# Patient Record
Sex: Female | Born: 1950
Health system: Southern US, Community
[De-identification: ages and names within clinical notes are randomized; demographics above are authoritative.]

## PROBLEM LIST (undated history)

## (undated) DIAGNOSIS — M792 Neuralgia and neuritis, unspecified: Secondary | ICD-10-CM

## (undated) DIAGNOSIS — F32A Depression, unspecified: Secondary | ICD-10-CM

## (undated) DIAGNOSIS — Z87898 Personal history of other specified conditions: Secondary | ICD-10-CM

## (undated) DIAGNOSIS — F329 Major depressive disorder, single episode, unspecified: Secondary | ICD-10-CM

## (undated) DIAGNOSIS — K219 Gastro-esophageal reflux disease without esophagitis: Secondary | ICD-10-CM

## (undated) DIAGNOSIS — F419 Anxiety disorder, unspecified: Secondary | ICD-10-CM

## (undated) DIAGNOSIS — M797 Fibromyalgia: Secondary | ICD-10-CM

## (undated) DIAGNOSIS — Z8719 Personal history of other diseases of the digestive system: Secondary | ICD-10-CM

## (undated) DIAGNOSIS — M1990 Unspecified osteoarthritis, unspecified site: Secondary | ICD-10-CM

## (undated) DIAGNOSIS — T4145XA Adverse effect of unspecified anesthetic, initial encounter: Secondary | ICD-10-CM

## (undated) DIAGNOSIS — I10 Essential (primary) hypertension: Secondary | ICD-10-CM

## (undated) DIAGNOSIS — I509 Heart failure, unspecified: Secondary | ICD-10-CM

## (undated) DIAGNOSIS — IMO0002 Reserved for concepts with insufficient information to code with codable children: Secondary | ICD-10-CM

## (undated) DIAGNOSIS — M2550 Pain in unspecified joint: Secondary | ICD-10-CM

## (undated) DIAGNOSIS — G4730 Sleep apnea, unspecified: Secondary | ICD-10-CM

## (undated) DIAGNOSIS — M5126 Other intervertebral disc displacement, lumbar region: Secondary | ICD-10-CM

## (undated) DIAGNOSIS — T8859XA Other complications of anesthesia, initial encounter: Secondary | ICD-10-CM

## (undated) HISTORY — DX: Neuralgia and neuritis, unspecified: M79.2

## (undated) HISTORY — DX: Pain in unspecified joint: M25.50

## (undated) HISTORY — PX: BLADDER REPAIR: SHX76

## (undated) HISTORY — PX: GALLBLADDER SURGERY: SHX652

## (undated) HISTORY — DX: Major depressive disorder, single episode, unspecified: F32.9

## (undated) HISTORY — DX: Depression, unspecified: F32.A

## (undated) HISTORY — DX: Other intervertebral disc displacement, lumbar region: M51.26

## (undated) HISTORY — PX: BACK SURGERY: SHX140

## (undated) HISTORY — PX: TUBAL LIGATION: SHX77

## (undated) HISTORY — PX: CARPAL TUNNEL RELEASE: SHX101

## (undated) HISTORY — PX: REPLACEMENT TOTAL KNEE: SUR1224

## (undated) HISTORY — PX: SPINAL FUSION: SHX223

## (undated) HISTORY — PX: CHOLECYSTECTOMY: SHX55

## (undated) HISTORY — PX: ROTATOR CUFF REPAIR: SHX139

## (undated) HISTORY — DX: Heart failure, unspecified: I50.9

## (undated) HISTORY — DX: Gastro-esophageal reflux disease without esophagitis: K21.9

## (undated) HISTORY — DX: Fibromyalgia: M79.7

---

## 1976-12-06 DIAGNOSIS — Z87898 Personal history of other specified conditions: Secondary | ICD-10-CM

## 1976-12-06 HISTORY — DX: Personal history of other specified conditions: Z87.898

## 2001-07-24 ENCOUNTER — Encounter: Payer: Self-pay | Admitting: Family Medicine

## 2001-07-24 ENCOUNTER — Ambulatory Visit (HOSPITAL_COMMUNITY): Admission: RE | Admit: 2001-07-24 | Discharge: 2001-07-24 | Payer: Self-pay | Admitting: Family Medicine

## 2001-09-29 ENCOUNTER — Ambulatory Visit (HOSPITAL_COMMUNITY): Admission: RE | Admit: 2001-09-29 | Discharge: 2001-09-29 | Payer: Self-pay | Admitting: Neurosurgery

## 2003-09-25 ENCOUNTER — Encounter: Payer: Self-pay | Admitting: Orthopedic Surgery

## 2003-10-02 ENCOUNTER — Encounter: Payer: Self-pay | Admitting: Orthopedic Surgery

## 2005-10-19 ENCOUNTER — Other Ambulatory Visit: Admission: RE | Admit: 2005-10-19 | Discharge: 2005-10-19 | Payer: Self-pay | Admitting: Dermatology

## 2007-09-07 ENCOUNTER — Ambulatory Visit (HOSPITAL_COMMUNITY): Admission: RE | Admit: 2007-09-07 | Discharge: 2007-09-07 | Payer: Self-pay | Admitting: Family Medicine

## 2008-03-29 ENCOUNTER — Emergency Department (HOSPITAL_COMMUNITY): Admission: EM | Admit: 2008-03-29 | Discharge: 2008-03-29 | Payer: Self-pay | Admitting: Emergency Medicine

## 2008-03-30 ENCOUNTER — Emergency Department (HOSPITAL_COMMUNITY): Admission: EM | Admit: 2008-03-30 | Discharge: 2008-03-30 | Payer: Self-pay | Admitting: Emergency Medicine

## 2008-06-03 ENCOUNTER — Emergency Department (HOSPITAL_COMMUNITY): Admission: EM | Admit: 2008-06-03 | Discharge: 2008-06-03 | Payer: Self-pay | Admitting: Emergency Medicine

## 2008-06-06 ENCOUNTER — Ambulatory Visit (HOSPITAL_COMMUNITY): Admission: RE | Admit: 2008-06-06 | Discharge: 2008-06-06 | Payer: Self-pay | Admitting: Family Medicine

## 2008-09-23 ENCOUNTER — Ambulatory Visit: Payer: Self-pay | Admitting: Orthopedic Surgery

## 2008-09-23 DIAGNOSIS — M7150 Other bursitis, not elsewhere classified, unspecified site: Secondary | ICD-10-CM | POA: Insufficient documentation

## 2008-09-23 DIAGNOSIS — M25549 Pain in joints of unspecified hand: Secondary | ICD-10-CM | POA: Insufficient documentation

## 2008-10-17 ENCOUNTER — Ambulatory Visit: Payer: Self-pay | Admitting: Orthopedic Surgery

## 2008-12-06 ENCOUNTER — Emergency Department (HOSPITAL_COMMUNITY): Admission: EM | Admit: 2008-12-06 | Discharge: 2008-12-06 | Payer: Self-pay | Admitting: Emergency Medicine

## 2009-05-22 ENCOUNTER — Ambulatory Visit: Payer: Self-pay | Admitting: Orthopedic Surgery

## 2009-05-22 DIAGNOSIS — M25569 Pain in unspecified knee: Secondary | ICD-10-CM | POA: Insufficient documentation

## 2009-05-22 DIAGNOSIS — IMO0002 Reserved for concepts with insufficient information to code with codable children: Secondary | ICD-10-CM | POA: Insufficient documentation

## 2009-05-22 DIAGNOSIS — M1710 Unilateral primary osteoarthritis, unspecified knee: Secondary | ICD-10-CM

## 2009-07-03 ENCOUNTER — Ambulatory Visit: Payer: Self-pay | Admitting: Orthopedic Surgery

## 2009-07-03 ENCOUNTER — Encounter (INDEPENDENT_AMBULATORY_CARE_PROVIDER_SITE_OTHER): Payer: Self-pay | Admitting: *Deleted

## 2009-07-03 DIAGNOSIS — M23302 Other meniscus derangements, unspecified lateral meniscus, unspecified knee: Secondary | ICD-10-CM | POA: Insufficient documentation

## 2009-07-09 ENCOUNTER — Ambulatory Visit (HOSPITAL_COMMUNITY): Admission: RE | Admit: 2009-07-09 | Discharge: 2009-07-09 | Payer: Self-pay | Admitting: Orthopedic Surgery

## 2009-07-09 ENCOUNTER — Encounter (INDEPENDENT_AMBULATORY_CARE_PROVIDER_SITE_OTHER): Payer: Self-pay | Admitting: *Deleted

## 2009-07-21 ENCOUNTER — Ambulatory Visit: Payer: Self-pay | Admitting: Orthopedic Surgery

## 2009-07-30 ENCOUNTER — Telehealth: Payer: Self-pay | Admitting: Orthopedic Surgery

## 2009-08-01 ENCOUNTER — Ambulatory Visit: Payer: Self-pay | Admitting: Orthopedic Surgery

## 2009-08-01 ENCOUNTER — Ambulatory Visit (HOSPITAL_COMMUNITY): Admission: RE | Admit: 2009-08-01 | Discharge: 2009-08-01 | Payer: Self-pay | Admitting: Orthopedic Surgery

## 2009-08-05 ENCOUNTER — Ambulatory Visit: Payer: Self-pay | Admitting: Orthopedic Surgery

## 2009-08-06 ENCOUNTER — Encounter (HOSPITAL_COMMUNITY): Admission: RE | Admit: 2009-08-06 | Discharge: 2009-09-03 | Payer: Self-pay | Admitting: Orthopedic Surgery

## 2009-08-06 ENCOUNTER — Encounter: Payer: Self-pay | Admitting: Orthopedic Surgery

## 2009-08-08 ENCOUNTER — Encounter: Payer: Self-pay | Admitting: Orthopedic Surgery

## 2009-08-15 ENCOUNTER — Encounter: Payer: Self-pay | Admitting: Orthopedic Surgery

## 2009-08-26 ENCOUNTER — Ambulatory Visit: Payer: Self-pay | Admitting: Orthopedic Surgery

## 2009-10-07 ENCOUNTER — Ambulatory Visit: Payer: Self-pay | Admitting: Orthopedic Surgery

## 2009-11-03 ENCOUNTER — Ambulatory Visit: Payer: Self-pay | Admitting: Orthopedic Surgery

## 2009-11-10 ENCOUNTER — Telehealth: Payer: Self-pay | Admitting: Orthopedic Surgery

## 2009-12-10 ENCOUNTER — Ambulatory Visit: Payer: Self-pay | Admitting: Orthopedic Surgery

## 2010-01-27 ENCOUNTER — Ambulatory Visit: Payer: Self-pay | Admitting: Orthopedic Surgery

## 2010-02-04 ENCOUNTER — Ambulatory Visit: Payer: Self-pay | Admitting: Orthopedic Surgery

## 2010-02-05 ENCOUNTER — Encounter: Payer: Self-pay | Admitting: Orthopedic Surgery

## 2010-02-05 ENCOUNTER — Telehealth: Payer: Self-pay | Admitting: Orthopedic Surgery

## 2010-02-06 ENCOUNTER — Encounter: Payer: Self-pay | Admitting: Orthopedic Surgery

## 2010-02-06 ENCOUNTER — Encounter (INDEPENDENT_AMBULATORY_CARE_PROVIDER_SITE_OTHER): Payer: Self-pay | Admitting: *Deleted

## 2010-02-09 ENCOUNTER — Encounter: Payer: Self-pay | Admitting: Orthopedic Surgery

## 2010-02-13 ENCOUNTER — Inpatient Hospital Stay (HOSPITAL_COMMUNITY): Admission: RE | Admit: 2010-02-13 | Discharge: 2010-02-16 | Payer: Self-pay | Admitting: Orthopedic Surgery

## 2010-02-13 ENCOUNTER — Ambulatory Visit: Payer: Self-pay | Admitting: Orthopedic Surgery

## 2010-02-16 ENCOUNTER — Telehealth (INDEPENDENT_AMBULATORY_CARE_PROVIDER_SITE_OTHER): Payer: Self-pay | Admitting: *Deleted

## 2010-02-17 ENCOUNTER — Encounter: Payer: Self-pay | Admitting: Orthopedic Surgery

## 2010-02-20 ENCOUNTER — Telehealth: Payer: Self-pay | Admitting: Orthopedic Surgery

## 2010-02-20 ENCOUNTER — Encounter: Payer: Self-pay | Admitting: Orthopedic Surgery

## 2010-02-26 ENCOUNTER — Ambulatory Visit: Payer: Self-pay | Admitting: Orthopedic Surgery

## 2010-02-26 DIAGNOSIS — Z96659 Presence of unspecified artificial knee joint: Secondary | ICD-10-CM | POA: Insufficient documentation

## 2010-03-05 ENCOUNTER — Encounter: Payer: Self-pay | Admitting: Orthopedic Surgery

## 2010-03-13 ENCOUNTER — Encounter: Payer: Self-pay | Admitting: Orthopedic Surgery

## 2010-03-13 ENCOUNTER — Encounter (INDEPENDENT_AMBULATORY_CARE_PROVIDER_SITE_OTHER): Payer: Self-pay | Admitting: *Deleted

## 2010-03-17 ENCOUNTER — Encounter: Payer: Self-pay | Admitting: Orthopedic Surgery

## 2010-03-17 ENCOUNTER — Encounter (HOSPITAL_COMMUNITY): Admission: RE | Admit: 2010-03-17 | Discharge: 2010-04-16 | Payer: Self-pay | Admitting: Orthopedic Surgery

## 2010-03-20 ENCOUNTER — Encounter: Payer: Self-pay | Admitting: Orthopedic Surgery

## 2010-03-26 ENCOUNTER — Ambulatory Visit: Payer: Self-pay | Admitting: Orthopedic Surgery

## 2010-04-16 ENCOUNTER — Encounter: Payer: Self-pay | Admitting: Orthopedic Surgery

## 2010-04-21 ENCOUNTER — Encounter (HOSPITAL_COMMUNITY): Admission: RE | Admit: 2010-04-21 | Discharge: 2010-05-21 | Payer: Self-pay | Admitting: Orthopedic Surgery

## 2010-05-01 ENCOUNTER — Encounter: Payer: Self-pay | Admitting: Orthopedic Surgery

## 2010-05-05 ENCOUNTER — Encounter (INDEPENDENT_AMBULATORY_CARE_PROVIDER_SITE_OTHER): Payer: Self-pay | Admitting: *Deleted

## 2010-05-05 ENCOUNTER — Ambulatory Visit: Payer: Self-pay | Admitting: Orthopedic Surgery

## 2010-06-03 ENCOUNTER — Ambulatory Visit (HOSPITAL_COMMUNITY): Admission: RE | Admit: 2010-06-03 | Discharge: 2010-06-03 | Payer: Self-pay | Admitting: Family Medicine

## 2010-08-05 ENCOUNTER — Ambulatory Visit: Payer: Self-pay | Admitting: Orthopedic Surgery

## 2010-12-15 ENCOUNTER — Ambulatory Visit: Admit: 2010-12-15 | Payer: Self-pay | Admitting: Orthopedic Surgery

## 2010-12-15 ENCOUNTER — Ambulatory Visit
Admission: RE | Admit: 2010-12-15 | Discharge: 2010-12-15 | Payer: Self-pay | Source: Home / Self Care | Attending: Orthopedic Surgery | Admitting: Orthopedic Surgery

## 2010-12-15 ENCOUNTER — Encounter: Payer: Self-pay | Admitting: Orthopedic Surgery

## 2010-12-15 DIAGNOSIS — M47817 Spondylosis without myelopathy or radiculopathy, lumbosacral region: Secondary | ICD-10-CM | POA: Insufficient documentation

## 2010-12-15 DIAGNOSIS — M48061 Spinal stenosis, lumbar region without neurogenic claudication: Secondary | ICD-10-CM | POA: Insufficient documentation

## 2010-12-15 DIAGNOSIS — M25559 Pain in unspecified hip: Secondary | ICD-10-CM | POA: Insufficient documentation

## 2010-12-21 ENCOUNTER — Encounter (HOSPITAL_COMMUNITY)
Admission: RE | Admit: 2010-12-21 | Discharge: 2011-01-05 | Payer: Self-pay | Source: Home / Self Care | Attending: Orthopedic Surgery | Admitting: Orthopedic Surgery

## 2011-01-05 NOTE — Letter (Signed)
Summary: Medication list  Medication list   Imported By: Jacklynn Ganong 02/10/2010 09:53:01  _____________________________________________________________________  External Attachment:    Type:   Image     Comment:   External Document

## 2011-01-05 NOTE — Miscellaneous (Signed)
Summary: PT clinical evaluation  PT clinical evaluation   Imported By: Jacklynn Ganong 03/25/2010 16:43:05  _____________________________________________________________________  External Attachment:    Type:   Image     Comment:   External Document

## 2011-01-05 NOTE — Miscellaneous (Signed)
Summary: faxed info to [facility] for cpm and to [facility] for pt  Clinical Lists Changes

## 2011-01-05 NOTE — Letter (Signed)
Summary: Work [facility]  [address]  [place], [place] [address]   Phone: [phone]  Fax: [phone]    Today's Date: May 05, 2010  Name of Patient: Emily [name]  The above named patient had a medical visit in our office today.  Please take this into consideration when reviewing the time away from work.    Special Instructions:   [ X ] To be off the remainder of today, returning to the normal, full duty work schedule tomorrow, May 06, 2010.    Arly.Keller ] Other No restrictions __________________________________________ ________________________________________________________________________   Sincerely,   Terrance Mass, MD

## 2011-01-05 NOTE — Letter (Signed)
Summary: Authorization Inpatient admission 02/13/10  Authorization Inpatient admission 02/13/10   Imported By: Cammie Sickle 02/20/2010 17:39:38  _____________________________________________________________________  External Attachment:    Type:   Image     Comment:   External Document

## 2011-01-05 NOTE — Miscellaneous (Signed)
Summary: Home Care Dischg summary [facility] Dischg summary [place]   Imported By: Cammie Sickle 05/27/2010 20:42:37  _____________________________________________________________________  External Attachment:    Type:   Image     Comment:   External Document

## 2011-01-05 NOTE — Assessment & Plan Note (Signed)
Summary: 1 M RE-CK LT KNEE/MEDCOST/CAF   Visit Type:  Follow-up  CC:  left knee pain.  History of Present Illness: I saw Emily [name] in the office today for a followup visit.  She is a 60 years old woman with the complaint of:  DX: Left knee pain after surgery.  Treatment :DOS 08/01/09 left knee/Procedure arthroscopy left knee with partial medial menisectomy  MEDS: Naproxen 500mg  two times a day.  Complaints:  Quit taking Relafen, no relief. Naproxen helps. Has some swelling and pain medially, postional changes makes her lateral knee hurt.  Today, scheduled for: one month recheck left knee after new med.  Review of systems the patient complains of some discomfort in the RIGHT knee status post lateral meniscectomy about 18 years ago by Dr. Hilda Lias.  She also was evaluated at [facility] for bone lesion which was found to be an occult fracture.  She is worried about the RIGHT knee drifting into valgus.  Physical examination  LEFT knee.  Her range of motion is proximal at 120, motor strength is normal.  Her knee is stable.  There is no swelling.  No tenderness today.  There is crepitance in the patellofemoral joint.  Skins intact.  Neurologic exam normal.  Pulses perfusion normal and the LEFT limb.  Assessment: Stable arthritis LEFT knee control with naproxen continue.  Recheck 3 months with x-rays.   Allergies: 1)  ! Codeine 2)  ! * Bactrin 3)  ! Lortab   Impression & Recommendations:  Problem # 1:  KNEE, ARTHRITIS, DEGEN./OSTEO (ICD-715.96) Assessment Improved  Her updated medication list for this problem includes:    Nabumetone 500 Mg Tabs (Nabumetone) .Marland Kitchen... 1 by mouth two times a day    Naproxen 500 Mg Tabs (Naproxen) .Marland Kitchen... 1 by mouth two times a day  Orders: Est. Patient Level III ([address])  Discussed strengthening exercises, use of ice or heat, and medications.   Patient Instructions: 1)  Naproxen continue  2)  exercises bike is ok  3)  quad exercises  4)  return in 3  months for xrays left knee

## 2011-01-05 NOTE — Letter (Signed)
Summary: Out of Work  [facility]  [address]  [place], [place] [address]   Phone: [phone]  Fax: [phone]    March 26, 2010   Employee:  Emily [name]    To Whom It May Concern:   For Medical reasons, please continue out of work status regarding the above named employee from work as follows:  Start:   02/13/10  End/Return to work, full duty, no restrictions:   05/06/10    (Next scheduled appointment:  05/05/10)  If you need additional information, please feel free to contact our office.         Sincerely,    Terrance Mass, MD

## 2011-01-05 NOTE — Miscellaneous (Signed)
Summary: Pre-Auth information in-patient surgery  Clinical Lists Changes  Contacted insurer [employer] re: in-patient surgery scheduled 02/13/10 [facility],  [place] [address] ph [phone], reached Doralee Albino.  Rec'd pre-auth # [id number]; approved for 3day stay. If hosp needs to do updates or addit days, request via fax [phone] nurse line.

## 2011-01-05 NOTE — Miscellaneous (Signed)
Summary: PT Progress report  PT Progress report   Imported By: Jacklynn Ganong 03/02/2010 17:05:32  _____________________________________________________________________  External Attachment:    Type:   Image     Comment:   External Document

## 2011-01-05 NOTE — Assessment & Plan Note (Signed)
Summary: 1 WK RE-CK LT KNEE FOLG INJEC/MEDCOST/CAF   Visit Type:  Follow-up  CC:  increasing LEFT knee pain.  History of Present Illness:   This is a 60 year old female who has been followed since 2004 for pain and swelling of the LEFT knee she was initially treated with Naprosyn and cortisone injection did not improve and had an MRI which showed meniscal tear severe 3 compartment arthritis and synovitis of LEFT knee  In August of 2000 inch underwent arthroscopy LEFT knee partial medial meniscectomy and we also found a grade 4 lesion of her trochlea had significant synovitis which was resected  She was followed until March of this year with injections a trial of ibuprofen, trial of Relafen, restart of naproxen and one injection of Synvisc and has worsened.  She is in severe pain has a severe limp has continued swelling of the knee loss of motion.  Her pain has become unbearable and she wishes to have knee replacement surgery.  We did take x-rays today they show the medial compartment disease with some patellofemoral disease.  We discussed further surgical treatment with arthroscopy vs. medical treatment with continued Synvisc injections vs. knee replacement surgery.  She is opted for the knee replacement surgery with an understanding of the risks and benefits of the treatments described including but not limited to blood clots, pulmonary embolus, infection, amputation, reimplantation for infection, pain.  [facility] Meds: Naproxen, Lasix, Potassium, Verapamil, Lopid, Celexa, Zantac, Xanax.  Allergies (verified): 1)  ! Codeine 2)  ! * Bactrin 3)  ! 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 Cardiovascular:  Denies chest pain, palpitations, fainting, and murmurs. Respiratory:  Denies short of breath, wheezing, couch, tightness, pain on inspiration, and snoring . Gastrointestinal:  Complains of heartburn. Genitourinary:  Denies frequency, urgency, difficulty urinating, painful urination, flank pain, and bleeding in urine. Neurologic:  Denies numbness, tingling, unsteady gait, dizziness, tremors, and seizure. Musculoskeletal:  Complains of joint pain, swelling, and stiffness. Endocrine:  Denies excessive thirst, exessive urination, and heat or cold intolerance. Psychiatric:  Denies nervousness, depression, anxiety, and hallucinations. Skin:  Denies changes in the skin, poor healing, rash, itching, and redness. HEENT:  Denies blurred or double vision, eye pain, redness, and watering. Immunology:  Denies seasonal allergies, sinus problems, and allergic to bee stings. Hemoatologic:  Denies easy bleeding and brusing.  Physical Exam  Additional Exam:  GEN: well developed, well nourished, normal grooming and hygiene, no deformity and normal body habitus.   CDV: pulses are normal, no edema, no erythema. no tenderness  Lymph: normal lymph nodes   Skin: no rashes, skin lesions or open sores   NEURO: normal coordination, reflexes, sensation.   Psyche: awake, alert and oriented. Mood normal   Gait: severe limp favoring the LEFT leg   The upper extremities have normal appearance, ROM, strength  and  stability.    Evaluation of the LEFT knee shows that she has a joint effusion her range of motion is 90 and she has pain her knee is stable she is medial and patellofemoral joint line tenderness her muscle strength and tone are normal   Evaluation of the RIGHT knee shows that she has some mild limitation of motion but 125 of knee flexion mild periarticular tenderness nothing significant muscle strength and tone are normal knee is stable    Inspection ROM Motor Stability    Impression & Recommendations:  Problem # 1:  KNEE, ARTHRITIS, DEGEN./OSTEO (ICD-715.96) Assessment Deteriorated  3 x-rays are ordered of the LEFT knee  Findings, there are notable changes in the knee including medial joint space narrowing, patellofemoral spurring.  Mild varus deformity.  Impression posterolateral his LEFT knee with varus deformity which is mild  Plan: LEFT TKA   Orders: Est. Patient Level IV ([address]) Knee x-ray,  3 views ([address])  Patient Instructions: 1)  DOS 02/13/10 2)  I will call you with your preop, you will take the packet to [facility] stay [facility] for preop. 3)  Post op 1 in our office on 02/26/10

## 2011-01-05 NOTE — Assessment & Plan Note (Signed)
Summary: TKA X-Rays left knee/rrs   Visit Type:  Follow-up  CC:  left knee replacement.  History of Present Illness: I saw Carmesha Bran in the office today for a followup visit.  She is a 60 years old woman with the complaint of:  left knee replacement  Xrays today.  DOS 02-13-10.  Procedure: left total knee arthroplasty. Verilast/ Arta Bruce   Medications: Hydrocodone 5, Methocarbam 500mg .  flexion 100, extension -5. His incision looks great. No sequela, swelling, or erythema.  X-rays were obtained or static alignment is normal.  Recommend physical therapy. Followup just for stomach elect to work in late May, no x-rays needed  Allergies: 1)  ! Codeine 2)  ! * Bactrin 3)  ! Lortab   Impression & Recommendations:  Problem # 1:  KNEE REPLACEMENT, HX OF (ICD-V43.65)  The  alignment was normal, PTF reduced without tilt or subluxation, no evidence of loosening.  IMPRESSION: normal appearance of the implant   Orders: Post-Op Check ([address]) Knee x-ray,  3 views ([address])  Patient Instructions: 1)  Come back May 31st for recheck of left knee 2)  Continue therapy

## 2011-01-05 NOTE — Letter (Signed)
Summary: [employer] disability forms  [employer] disability forms   Imported By: Cammie Sickle 02/20/2010 17:42:20  _____________________________________________________________________  External Attachment:    Type:   Image     Comment:   External Document

## 2011-01-05 NOTE — Miscellaneous (Signed)
Summary: [employer]/PT discharge summary  [employer]/PT discharge summary   Imported By: Jacklynn Ganong 05/07/2010 13:48:45  _____________________________________________________________________  External Attachment:    Type:   Image     Comment:   External Document

## 2011-01-05 NOTE — Letter (Signed)
Summary: Out of Work  [facility]  [address]  [place], [place] [address]   Phone: [phone]  Fax: [phone]    February 04, 2010   Employee:  Emily [name]    To Whom It May Concern:   For Medical reasons, please excuse the above named employee from work for the following dates:  Start:   February 13, 2010 through May 08, 2010 (approximate)  End/Estimated Return to Work:   May 11, 2010   If you need additional information, please feel free to contact our office.         Sincerely,    Terrance Mass, MD

## 2011-01-05 NOTE — Miscellaneous (Signed)
Summary: op pt order [facility]  Clinical Lists Changes  Orders: Added new Referral order of Physical Therapy Referral (PT) - Signed

## 2011-01-05 NOTE — Assessment & Plan Note (Signed)
Summary: RECK LEFT KNEE TKA 02/13/10/MEDCOST/BSF   Visit Type:  Follow-up  CC:  post op TKA left.  History of Present Illness: 60 years old here for postop visit after knee replacement  Xrays today.  DOS 02-13-10.  Procedure: left total knee arthroplasty. Verilast/ Arta Bruce   Medications: Hydrocodone 5 three times a day helps.  Today is recheck ROM after PT.  Progress note from PT for review and to be signed.  she complains of occasional swelling and stiffness  Her pain has been relieved.    Allergies: 1)  ! Codeine 2)  ! * Bactrin 3)  ! Lortab   Knee Exam  General:    Well-developed, well-nourished, normal body habitus; no deformities, normal grooming.  Gait:    Normal heel-toe gait pattern bilaterally.    Skin:    healed skin incision nontender  Inspection:     No deformity, ecchymosis or swelling.   Palpation:    Non-tender to palpation over medial joint line, lateral joint line, parapatellar, condylar, patellar tendon, or Pes bursa.   Vascular:    There was no swelling or varicose veins. The pulses and temperature are normal. There was no edema or tenderness.  Sensory:    Gross coordination and sensation were normal.    Motor:    Motor strength 5/5 bilaterally for quadriceps, hamstrings, ankle dorsiflexion, and ankle plantar flexion.    Knee Exam:    Left:    Inspection/Palpation:  range of motion is zero-115   Impression & Recommendations:  Problem # 1:  KNEE REPLACEMENT, HX OF (ICD-V43.65) Assessment Improved  Orders: Post-Op Check ([address])  Medications Added to Medication List This Visit: 1)  Norco 5-325 Mg Tabs (Hydrocodone-acetaminophen) .Marland Kitchen.. 1 q 4 as needed pain  Patient Instructions: 1)  3 months recheck exam  Prescriptions: NORCO 5-325 MG TABS (HYDROCODONE-ACETAMINOPHEN) 1 q 4 as needed pain  #84 x 2   Entered and Authorized by:   Fuller Canada MD   Signed by:   Fuller Canada MD on 05/05/2010   Method used:   Print  then Give to Patient   RxID:   [id number] METHOCARBAMOL 500 MG TABS (METHOCARBAMOL) one by mouth q 6 hrs prn  #60 x 1   Entered and Authorized by:   Fuller Canada MD   Signed by:   Fuller Canada MD on 05/05/2010   Method used:   Faxed to ...       [facility] [employer] (retail)       [address]       [place], [place]  [address]       Ph: [phone]       Fax: [phone]   RxID:   [id number]

## 2011-01-05 NOTE — Letter (Signed)
Summary: surgery order LT total knee sch 02/13/10  surgery order LT total knee sch 02/13/10   Imported By: Cammie Sickle 02/05/2010 20:47:00  _____________________________________________________________________  External Attachment:    Type:   Image     Comment:   External Document

## 2011-01-05 NOTE — Miscellaneous (Signed)
Summary: [facility] Health plan of  care  [facility] plan of  care   Imported By: Jacklynn Ganong 03/25/2010 16:42:23  _____________________________________________________________________  External Attachment:    Type:   Image     Comment:   External Document

## 2011-01-05 NOTE — Letter (Signed)
Summary: Emily [name] short term disab form  Emily [name] short term disab form   Imported By: Cammie Sickle 04/03/2010 11:21:11  _____________________________________________________________________  External Attachment:    Type:   Image     Comment:   External Document

## 2011-01-05 NOTE — Assessment & Plan Note (Signed)
Summary: 3 M RE-CK/RE-EXAM TKA LT/SURG 02/13/10/MEDCOST/CAF   Visit Type:  Follow-up  CC:  left knee.  History of Present Illness: I saw Emily [name] in the office today for a 3 month followup visit.  She is a 60 years old woman with the complaint of:  left knee replacement.  DOS 02-13-10.  Procedure: left total knee arthroplasty. Verilast/ Arta Bruce   Medications: Hydrocodone.  The patient does have some lateral incisional numbness which is typical.  She does have some occasional lateral pain and she does have some difficulty getting out of a chair back.  She's returned to work she was able to attend her garden she walks for exercise and it dissipates and activities with her grandchildren and she notes significant improvement in the pain in her LEFT knee  Her knee looks really good.  She's ambulating without a cane with a heel to toe gait.  She has no swelling or tenderness.  She has 115 of knee flexion full extension good extension power her knee is stable her incision looks good she has no neuromas no peripheral edema  Assessment postoperative total knee visit doing well from osteoarthritis the LEFT knee continue as she isn't come back in 6 months for x-rays   Allergies: 1)  ! Codeine 2)  ! * Bactrin 3)  ! Lortab  Review of Systems Musculoskeletal:  See HPI.   Impression & Recommendations:  Problem # 1:  KNEE REPLACEMENT, HX OF (ICD-V43.65) Assessment Improved  Orders: Est. Patient Level III ([address])  Problem # 2:  KNEE, ARTHRITIS, DEGEN./OSTEO (IHK-742.59) Assessment: Improved  Her updated medication list for this problem includes:    Nabumetone 500 Mg Tabs (Nabumetone) .Marland Kitchen... 1 by mouth two times a day    Naproxen 500 Mg Tabs (Naproxen) .Marland Kitchen... 1 by mouth two times a day    Methocarbamol 500 Mg Tabs (Methocarbamol) ..... One by mouth q 6 hrs prn    Norco 5-325 Mg Tabs (Hydrocodone-acetaminophen) .Marland Kitchen... 1 q 4 as needed pain  Orders: Est. Patient Level III  ([address])  Patient Instructions: 1)  March 2012 xrays of the knee  2)  continue exercises for your knee to improve your strength [this will help with getting up from a seated position] Prescriptions: NORCO 5-325 MG TABS (HYDROCODONE-ACETAMINOPHEN) 1 q 4 as needed pain  #84 x 5   Entered and Authorized by:   Fuller Canada MD   Signed by:   Fuller Canada MD on 08/05/2010   Method used:   Print then Give to Patient   RxID:   [id number]

## 2011-01-05 NOTE — Letter (Signed)
Summary: Handicapped placard  Handicapped placard   Imported By: Cammie Sickle 03/02/2010 15:51:07  _____________________________________________________________________  External Attachment:    Type:   Image     Comment:   External Document

## 2011-01-05 NOTE — Assessment & Plan Note (Signed)
Summary: POST OP 1/TKA LT/CAF   Visit Type:  post op  CC:  post op left knee.  History of Present Illness: POD # 13  DOS 02-13-10.  Procedure: left total knee arthroplasty. Verilast/ Arta Bruce   Medications: Hydrocodone 5, Methocarbam 500mg .  She has 90 of flexion, less than 5 loss of extension, her incision looks great. She has minimal swelling.  I will see her again in 4 weeks for her 1st postop x-ray. Her therapy report indicates that she has 91 of flexion 3/5 quad strength ambulating 300 feet with a cane.  Doing well  Allergies: 1)  ! Codeine 2)  ! * Bactrin 3)  ! Lortab   Impression & Recommendations:  Problem # 1:  AFTERCARE FOLLOW SURGERY MUSCULOSKEL SYSTEM NEC (ICD-V58.78) Assessment Comment Only  Orders: Post-Op Check ([address])  Patient Instructions: 1)  4 weeks return for TKA xrays left kee

## 2011-01-05 NOTE — Progress Notes (Signed)
Summary: question from [facility] [facility] case mgmt/dischg planning  Phone Note From Other Clinic   Caller: Referral Coordinator Summary of Call: [facility] Case mgmt/Dischg planning department, asking which home health agency we faxed patient's home care paperwork to, after [facility] advised that they do not accept [facility] insurance [facility] [phone] Initial call taken by: Cammie Sickle,  February 16, 2010 11:20 AM  Follow-up for Phone Call        nobody,  advanced usually takes over from the hospital when this happens Follow-up by: Ether Griffins,  February 16, 2010 11:34 AM  Additional Follow-up for Phone Call Additional follow up Details #1::        Advised. Additional Follow-up by: Cammie Sickle,  February 16, 2010 11:49 AM    Additional Follow-up for Phone Call Additional follow up Details #2::    Corrie Dandy from [facility] called back; states [facility] case mgr called her, and Genevieve Norlander will do if they accept [employer]. She'll let us know. Follow-up by: Cammie Sickle,  February 16, 2010 11:54 AM

## 2011-01-05 NOTE — Letter (Signed)
Summary: Work [facility]  [address]  [place], [place] [address]   Phone: [phone]  Fax: [phone]     Today's Date: February 04, 2010  Name of Patient: Emily [name]  The above named patient had a medical visit today.  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  February 05, 2010 ________________________________________________________________________   Sincerely yours,   Terrance Mass, MD

## 2011-01-05 NOTE — Miscellaneous (Signed)
Summary: Note from [facility]  Note from [facility]   Imported By: Jacklynn Ganong 04/06/2010 10:46:25  _____________________________________________________________________  External Attachment:    Type:   Image     Comment:   External Document

## 2011-01-05 NOTE — Progress Notes (Signed)
Summary: [facility]  Phone Note Other Incoming   Caller: [facility] Summary of Call: Roseanne Reno, [facility] Home care physical therapist, called with the following results, taken today: Protime 1.6 INR  15.7  If any questions or further orders needed, his direct ph# is [phone]. Initial call taken by: Cammie Sickle,  February 20, 2010 1:07 PM

## 2011-01-05 NOTE — Letter (Signed)
Summary: Work [facility]  [address]  [place], [place] [address]   Phone: [phone]  Fax: [phone]     Today's Date: January 27, 2010  Name of Patient: Emily [name]  The above named patient had a medical visit today at:  am / pm.  Please take this into consideration when reviewing the time away from work/school.    Special Instructions:    Arly.Keller ] To be off the remainder of today, returning to the normal work / school schedule tomorrow, January 28, 2010, as tolerated.    Arly.Keller ] Other Follow up appointment scheduled February 04, 2010. ________________________________________________________________ ________________________________________________________________________   Sincerely,   Terrance Mass, MD

## 2011-01-05 NOTE — Progress Notes (Signed)
Summary: current medications  Phone Note Outgoing Call Call back at [facility] Phone [phone]   Summary of Call: called and lmom to call us back with a list of the meds she is currently taking Initial call taken by: Chasity Tereasa Coop,  February 05, 2010 1:44 PM  Follow-up for Phone Call        pt meds are updated in pts last visit dictation Follow-up by: Ether Griffins,  February 06, 2010 8:48 AM  Additional Follow-up for Phone Call Additional follow up Details #1::        per patient list received. Additional Follow-up by: Cammie Sickle,  February 06, 2010 2:08 PM

## 2011-01-05 NOTE — Miscellaneous (Signed)
Summary: PT discharge summary  PT discharge summary   Imported By: Jacklynn Ganong 05/21/2010 16:30:08  _____________________________________________________________________  External Attachment:    Type:   Image     Comment:   External Document

## 2011-01-05 NOTE — Assessment & Plan Note (Signed)
Summary: left knee pain needs xr surg 08/01/09/[facility]   Visit Type:  Follow-up  CC:  left knee pain post op.  History of Present Illness: I saw Emily [name] in the office today for a followup visit.  She is a 60 years old woman with the complaint of:  DX: Left knee pain after surgery.  Treatment :DOS 08/01/09 left knee/Procedure arthroscopy left knee with partial medial menisectomy  MEDS: naproxen 500 mg and Relafen were tried and while we had some initial success we have no longer been able to control her pain this way.  She continues to have medial knee pain which is worse when standing worsening as the days go on and associated with some pain when she changes direction as well as stiffness and catching when she stands up.  Meds: Verapamil 180 and Naproxen 500.  Due for xrays in April of this year.     Problems Prior to Update: 1)  Aftercare Follow Surgery Musculoskel System Nec  (ICD-V58.78) 2)  Derangement Meniscus  (ICD-717.5) 3)  Knee, Arthritis, Degen.Lanetta Inch  (ICD-715.96) 4)  Knee Pain  (ICD-719.46) 5)  Anserine Bursitis  (ICD-726.61) 6)  Pain in Joint, Hand  (ICD-719.44) 7)  Trigger Finger  (ICD-727.3) 8)  Hx, Family, Asthma  (ICD-V17.5) 9)  Family History of Arthritis  (ICD-V17.7) 10)  Family History Coronary Heart Disease Female < 55  (ICD-V17.3) 11)  Family History of Diabetes  (ICD-V18.0)  Allergies (verified): 1)  ! Codeine 2)  ! * Bactrin 3)  ! 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  Physical Exam  Additional Exam:  GENERAL: Appearance is normal   CDV: normal pulse and temperature   Skin: was normal   Neuro: sensation was normal   MSK  Ambulation seems normal  Inspection reveals tenderness over the medial compartment and painful range of motion with compression on the side of the joint.  Her meniscal sign is negative, her strength is normal range of motion is good there is no effusion she has no joint laxity       Impression & Recommendations:  Problem # 1:  KNEE, ARTHRITIS, DEGEN./OSTEO (ICD-715.96) Assessment Deteriorated  Assessment: She seems to have arthritis over the medial compartment and I recommended Synvisc injections  First injection today LEFT knee, Synvisc  Set sterile technique is used to inject one vial of Synvisc into the LEFT knee.  Her updated medication list for this problem includes:    Nabumetone 500 Mg Tabs (Nabumetone) .Marland Kitchen... 1 by mouth two times a day    Naproxen 500 Mg Tabs (Naproxen) .Marland Kitchen... 1 by mouth two times a day  Orders: Est. Patient Level III (02542) Synvisc (20610M)  Problem # 2:  KNEE PAIN (HCW-237.62) Assessment: Deteriorated  Her updated medication list for this problem includes:    Nabumetone 500 Mg Tabs (Nabumetone) .Marland Kitchen... 1 by mouth two times a  day    Naproxen 500 Mg Tabs (Naproxen) .Marland Kitchen... 1 by mouth two times a day  Orders: Est. Patient Level III (86578) Synvisc (46962X)  Patient Instructions: 1)  You have received an injection of synvisc  today. You may experience increased pain at the injection site. Apply ice pack to the area for 20 minutes every 2 hours and take 2 xtra strength tylenol every 8 hours. This increased pain will usually resolve in 24 hours.  The injection will take effect in 3-10 days.  2)  Please schedule a follow-up appointment in 1 week.

## 2011-01-06 ENCOUNTER — Ambulatory Visit (HOSPITAL_COMMUNITY)
Admission: RE | Admit: 2011-01-06 | Discharge: 2011-01-06 | Disposition: A | Discharge status: Home / Self Care | Payer: PRIVATE HEALTH INSURANCE | Source: Ambulatory Visit | Attending: Orthopedic Surgery | Admitting: Orthopedic Surgery

## 2011-01-06 DIAGNOSIS — M545 Low back pain, unspecified: Secondary | ICD-10-CM | POA: Insufficient documentation

## 2011-01-06 DIAGNOSIS — M6281 Muscle weakness (generalized): Secondary | ICD-10-CM | POA: Insufficient documentation

## 2011-01-06 DIAGNOSIS — IMO0001 Reserved for inherently not codable concepts without codable children: Secondary | ICD-10-CM | POA: Insufficient documentation

## 2011-01-07 ENCOUNTER — Encounter: Payer: Self-pay | Admitting: Orthopedic Surgery

## 2011-01-07 NOTE — Letter (Signed)
Summary: history form   history form   Imported By: Eugenio Hoes 12/16/2010 13:52:56  _____________________________________________________________________  External Attachment:    Type:   Image     Comment:   External Document

## 2011-01-07 NOTE — Assessment & Plan Note (Signed)
Summary: hip pain/needs xray/frs   Vital Signs:  Patient profile:   60 year old female Height:      62 inches Weight:      170 pounds Pulse rate:   72 / minute Resp:     16 per minute  Vitals Entered By: Emily Canada MD (December 15, 2010 8:41 AM)  Visit Type:  new problem Referring Provider:  self Primary Provider:  Dr. Lilyan [name]  CC:  hip pain.  History of Present Illness: I saw Emily [name] in the office today for a new problem visit.  She is a 60 years old woman with the complaint of:  right hip pain.  No injury.  Xrays today.  Meds: KCL 20mg , Lasix 20mg , Verapamil 180mg , Zantac, Lopid, Fexofenadine 180mg , Celexa 20mg , Norco 5mg , Robaxin.  60 year old female complains of RIGHT hip pain for several years. No history of injury. She had a history of a ruptured disc back in 2000 was treated nonoperatively. She complains now of deep dull, throbbing pain, which is constant, but worse at night. Over the lower back and RIGHT hip area and anterior thigh. She does not complain of groin pain. Her RIGHT leg is weak. There is some numbness in the RIGHT leg, but it doesn't go below the knee. She reports her pain level is 6/10.  Her pain has been unrelieved by Norco 5 mg, Lodine, and Robaxin. No previous treatment to date.         Allergies: 1)  ! Codeine 2)  ! * Bactrin 3)  ! 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 Cardiovascular:  Denies chest pain, palpitations, fainting, and murmurs. Respiratory:  Complains of snoring; denies short of breath, wheezing, couch, tightness, pain on inspiration, and snoring ; sleep apnea. Gastrointestinal:  Denies heartburn, nausea, vomiting, diarrhea,  constipation, and blood in your stools. Genitourinary:  Denies frequency, urgency, difficulty urinating, painful urination, flank pain, and bleeding in urine. Neurologic:  Complains of numbness, tingling, and unsteady gait; denies dizziness, tremors, and seizure. Musculoskeletal:  Complains of joint pain, swelling, instability, stiffness, redness, heat, and muscle pain. Endocrine:  Denies excessive thirst, exessive urination, and heat or cold intolerance. Psychiatric:  Denies nervousness, depression, anxiety, and hallucinations. Skin:  Denies changes in the skin, poor healing, rash, itching, and redness. HEENT:  Denies blurred or double vision, eye pain, redness, and watering. Immunology:  Complains of seasonal allergies; denies sinus problems and allergic to bee stings. Hemoatologic:  Denies easy bleeding and brusing.   Impression & Recommendations:  Problem # 1:  LUMBOSACRAL SPONDYLOSIS WITHOUT MYELOPATHY (ICD-721.3) Assessment New  separately identifiable. Radiographic report [facility], pelvis, for hip pain.  The joints of the hips are normal and there is no bone abnormalities seen in the pelvis.  Impression normal pelvic x-ray.  3 views, lumbar spine for hip back pain.  The lumbar lordosis. Has been lost and diminished. There is scoliosis in the last 2 segments. There is severe facet arthritis and the L4-L5 and L5-S1 region.  Impression facet arthritis, lumbar, loss of lordosis, and scoliosis.  The pain has not gone below the knee and I think she most likely has a degenerative condition of the lumbar spine associated with some static or dynamic neurogenic pain.  Recommend steroid Dosepak,  Neurontin, 100 mg titrated up to 300 mg, #60 with 2 refills and a double strength, prednisone 12 a Dosepak, along with some physical therapy. If she does not  Orders: Est. Patient Level IV (16109) Pelvis x-ray, 1/2 views (72170) Lumbosacral Spine ,2/3 views (72100)  Other Orders: Physical Therapy  Referral (PT)  Patient Instructions: 1)  come back in 6 weeks for a recheck on back   Orders Added: 1)  Physical Therapy Referral [PT] 2)  Est. Patient Level IV [60454] 3)  Pelvis x-ray, 1/2 views [72170] 4)  Lumbosacral Spine ,2/3 views [72100]

## 2011-01-08 ENCOUNTER — Ambulatory Visit (HOSPITAL_COMMUNITY)
Admission: RE | Admit: 2011-01-08 | Discharge: 2011-01-08 | Disposition: A | Discharge status: Home / Self Care | Payer: PRIVATE HEALTH INSURANCE | Source: Ambulatory Visit

## 2011-01-11 ENCOUNTER — Ambulatory Visit (HOSPITAL_COMMUNITY)
Admission: RE | Admit: 2011-01-11 | Discharge: 2011-01-11 | Disposition: A | Discharge status: Home / Self Care | Payer: PRIVATE HEALTH INSURANCE | Source: Ambulatory Visit | Attending: Orthopedic Surgery | Admitting: Orthopedic Surgery

## 2011-01-11 DIAGNOSIS — M545 Low back pain, unspecified: Secondary | ICD-10-CM | POA: Insufficient documentation

## 2011-01-11 DIAGNOSIS — IMO0001 Reserved for inherently not codable concepts without codable children: Secondary | ICD-10-CM | POA: Insufficient documentation

## 2011-01-11 DIAGNOSIS — M6281 Muscle weakness (generalized): Secondary | ICD-10-CM | POA: Insufficient documentation

## 2011-01-11 DIAGNOSIS — R262 Difficulty in walking, not elsewhere classified: Secondary | ICD-10-CM | POA: Insufficient documentation

## 2011-01-13 ENCOUNTER — Ambulatory Visit (HOSPITAL_COMMUNITY)
Admission: RE | Admit: 2011-01-13 | Discharge: 2011-01-13 | Disposition: A | Discharge status: Home / Self Care | Payer: PRIVATE HEALTH INSURANCE | Source: Ambulatory Visit | Attending: Orthopedic Surgery | Admitting: Orthopedic Surgery

## 2011-01-13 DIAGNOSIS — M6281 Muscle weakness (generalized): Secondary | ICD-10-CM | POA: Insufficient documentation

## 2011-01-13 DIAGNOSIS — R262 Difficulty in walking, not elsewhere classified: Secondary | ICD-10-CM | POA: Insufficient documentation

## 2011-01-13 DIAGNOSIS — IMO0001 Reserved for inherently not codable concepts without codable children: Secondary | ICD-10-CM | POA: Insufficient documentation

## 2011-01-13 DIAGNOSIS — M545 Low back pain, unspecified: Secondary | ICD-10-CM | POA: Insufficient documentation

## 2011-01-15 ENCOUNTER — Ambulatory Visit (HOSPITAL_COMMUNITY)
Admission: RE | Admit: 2011-01-15 | Discharge: 2011-01-15 | Disposition: A | Discharge status: Home / Self Care | Payer: PRIVATE HEALTH INSURANCE | Source: Ambulatory Visit

## 2011-01-19 ENCOUNTER — Ambulatory Visit (HOSPITAL_COMMUNITY)
Admission: RE | Admit: 2011-01-19 | Discharge: 2011-01-19 | Disposition: A | Discharge status: Home / Self Care | Payer: PRIVATE HEALTH INSURANCE | Source: Ambulatory Visit | Attending: Orthopedic Surgery | Admitting: Orthopedic Surgery

## 2011-01-19 ENCOUNTER — Encounter: Payer: Self-pay | Admitting: Orthopedic Surgery

## 2011-01-20 ENCOUNTER — Ambulatory Visit (HOSPITAL_COMMUNITY)
Admission: RE | Admit: 2011-01-20 | Discharge: 2011-01-20 | Disposition: A | Discharge status: Home / Self Care | Payer: PRIVATE HEALTH INSURANCE | Source: Ambulatory Visit | Attending: *Deleted | Admitting: *Deleted

## 2011-01-21 ENCOUNTER — Ambulatory Visit (HOSPITAL_COMMUNITY)
Admission: RE | Admit: 2011-01-21 | Discharge: 2011-01-21 | Disposition: A | Discharge status: Home / Self Care | Payer: PRIVATE HEALTH INSURANCE | Source: Ambulatory Visit | Attending: Orthopedic Surgery | Admitting: Orthopedic Surgery

## 2011-01-21 NOTE — Miscellaneous (Signed)
Summary: PT Clinical evaluation  PT Clinical evaluation   Imported By: Jacklynn Ganong 01/11/2011 13:15:38  _____________________________________________________________________  External Attachment:    Type:   Image     Comment:   External Document

## 2011-01-25 ENCOUNTER — Ambulatory Visit (HOSPITAL_COMMUNITY)
Admission: RE | Admit: 2011-01-25 | Discharge: 2011-01-25 | Disposition: A | Discharge status: Home / Self Care | Payer: PRIVATE HEALTH INSURANCE | Source: Ambulatory Visit | Admitting: Physical Therapy

## 2011-01-26 ENCOUNTER — Ambulatory Visit: Payer: Self-pay | Admitting: Orthopedic Surgery

## 2011-01-27 ENCOUNTER — Ambulatory Visit (HOSPITAL_COMMUNITY)
Admission: RE | Admit: 2011-01-27 | Discharge: 2011-01-27 | Disposition: A | Discharge status: Home / Self Care | Payer: PRIVATE HEALTH INSURANCE | Source: Ambulatory Visit | Attending: *Deleted | Admitting: *Deleted

## 2011-01-27 NOTE — Miscellaneous (Signed)
Summary: Rehab Report  Rehab Report   Imported By: Jacklynn Ganong 01/19/2011 15:41:11  _____________________________________________________________________  External Attachment:    Type:   Image     Comment:   External Document

## 2011-01-29 ENCOUNTER — Ambulatory Visit (HOSPITAL_COMMUNITY)
Admission: RE | Admit: 2011-01-29 | Discharge: 2011-01-29 | Disposition: A | Discharge status: Home / Self Care | Payer: PRIVATE HEALTH INSURANCE | Source: Ambulatory Visit | Attending: *Deleted | Admitting: *Deleted

## 2011-02-01 ENCOUNTER — Ambulatory Visit (HOSPITAL_COMMUNITY): Payer: PRIVATE HEALTH INSURANCE | Admitting: Physical Therapy

## 2011-02-03 ENCOUNTER — Encounter: Payer: Self-pay | Admitting: Orthopedic Surgery

## 2011-02-03 ENCOUNTER — Ambulatory Visit (HOSPITAL_COMMUNITY)
Admission: RE | Admit: 2011-02-03 | Discharge: 2011-02-03 | Disposition: A | Discharge status: Home / Self Care | Payer: PRIVATE HEALTH INSURANCE | Source: Ambulatory Visit | Attending: *Deleted | Admitting: *Deleted

## 2011-02-04 ENCOUNTER — Ambulatory Visit (INDEPENDENT_AMBULATORY_CARE_PROVIDER_SITE_OTHER): Payer: PRIVATE HEALTH INSURANCE | Admitting: Orthopedic Surgery

## 2011-02-04 ENCOUNTER — Encounter: Payer: Self-pay | Admitting: Orthopedic Surgery

## 2011-02-04 ENCOUNTER — Ambulatory Visit (HOSPITAL_COMMUNITY): Payer: PRIVATE HEALTH INSURANCE | Admitting: Physical Therapy

## 2011-02-04 DIAGNOSIS — M47817 Spondylosis without myelopathy or radiculopathy, lumbosacral region: Secondary | ICD-10-CM

## 2011-02-04 DIAGNOSIS — M48061 Spinal stenosis, lumbar region without neurogenic claudication: Secondary | ICD-10-CM

## 2011-02-09 ENCOUNTER — Other Ambulatory Visit: Payer: Self-pay | Admitting: Orthopedic Surgery

## 2011-02-09 DIAGNOSIS — M4800 Spinal stenosis, site unspecified: Secondary | ICD-10-CM

## 2011-02-11 ENCOUNTER — Telehealth: Payer: Self-pay | Admitting: Orthopedic Surgery

## 2011-02-11 ENCOUNTER — Ambulatory Visit (HOSPITAL_COMMUNITY): Admission: RE | Admit: 2011-02-11 | Payer: PRIVATE HEALTH INSURANCE | Source: Ambulatory Visit

## 2011-02-11 NOTE — Assessment & Plan Note (Signed)
Summary: 6 WK RE-CK BACK FOL'G PT/[facility] * 01/08/11 MSG LFT w/HUSB ...   Visit Type:  Follow-up Referring Provider:  self Primary Provider:  Dr. Lilyan Punt  CC:  back pain.  History of Present Illness: I saw Emily [name] in the office today for a 6 week  followup visit.  She is a 60 years old woman with the complaint of:  back pain  Meds: KCL 20mg , Lasix 20mg , Verapamil 180mg , Zantac, Lopid, Fexofenadine 180mg , Celexa 20mg , Norco 5mg , Robaxin.  HISTORY:  60 year old female complains of RIGHT hip pain for several years. No history of injury. She had a history of a ruptured disc back in 2000 was treated nonoperatively. She complains now of deep dull, throbbing pain, which is constant, but worse at night. Over the lower back and RIGHT hip area and anterior thigh. She does not complain of groin pain. Her RIGHT leg is weak. There is some numbness in the RIGHT leg, but it doesn't go below the knee. She reports her pain level is 6/10.  Medications: Norco 5 mg, Robaxin, Neurontin.  Complaints:  she's continued to have back pain on the RIGHT side, which radiates to the RIGHT hip and used to just above the knee with occasional episodes of pain radiating below the knee. She then has some numbness and weakness in the RIGHT lower extremity.  Red Flags: Denies   bowel /bladder problems, fever night sweats     Allergies: 1)  ! Codeine 2)  ! * Bactrin 3)  ! Lortab   Detailed Back/Spine Exam  General:    Well-developed, well-nourished, in no acute distress; alert and oriented x 3.    Gait:    Normal heel-toe gait pattern bilaterally.    Skin:    Intact with no erythema; no scarring.    Vascular:    dorsalis pedis and posterior tibial pulses 2+ and symmetric, capillary refill < 2 seconds, normal hair pattern, no evidence of ischemia.   Lumbosacral Exam:  Inspection-deformity:    Abnormal Palpation-spinal tenderness:  Abnormal    Location:  L4-L5 Range of Motion:    Forward  Flexion:   90 degrees    Hyperextension:   30 degrees Sitting Straight Leg Raise:    Right:  positive at 40 degrees    Left:  negative Contralateral Straight Leg Raise:    Right:  negative    Left:  negative Sciatic Notch:    There is no sciatic notch tenderness. Toe Walking:    Right:  normal    Left:  normal Heel Walking:    Right:  normal    Left:  normal   Impression & Recommendations:  Problem # 1:  SPINAL STENOSIS, LUMBAR (ICD-724.02) Assessment Deteriorated  REC: MRI L-SPINE  + FINDINGS: HAD NSAIDS PT AND VICODIN X > 6 WEEKS  BACK PAIN RAD DOWN RIGHT LEG WEAK RT LEG  NUMBNESS RT FOOT   Orders: Est. Patient Level III (16109)  Problem # 2:  LUMBOSACRAL SPONDYLOSIS WITHOUT MYELOPATHY (ICD-721.3) Assessment: Deteriorated  Orders: Est. Patient Level III (60454)  Patient Instructions: 1)  MRI L SPINE    Orders Added: 1)  Est. Patient Level III [09811]

## 2011-02-16 NOTE — Miscellaneous (Signed)
Summary: physical therapy note  physical therapy note   Imported By: Cammie Sickle 02/09/2011 17:33:33  _____________________________________________________________________  External Attachment:    Type:   Image     Comment:   External Document

## 2011-02-16 NOTE — Progress Notes (Signed)
Summary: MRI appointment approval.  Phone Note Outgoing Call   Call placed by: Waldon Reining,  February 11, 2011 3:10 PM Call placed to: Insurer Action Taken: Advertising account executive of Call: Patient missed her MRI appointment, could not get in touch with her. She has [facility], on precert needed for outpatient per Bonita Quin B.

## 2011-02-18 ENCOUNTER — Ambulatory Visit: Payer: PRIVATE HEALTH INSURANCE | Admitting: Orthopedic Surgery

## 2011-02-22 ENCOUNTER — Telehealth: Payer: Self-pay | Admitting: Orthopedic Surgery

## 2011-02-28 LAB — CBC
HCT: 28.2 % — ABNORMAL LOW (ref 36.0–46.0)
Hemoglobin: 11 g/dL — ABNORMAL LOW (ref 12.0–15.0)
MCHC: 35.2 g/dL (ref 30.0–36.0)
MCHC: 35.2 g/dL (ref 30.0–36.0)
MCHC: 36 g/dL (ref 30.0–36.0)
MCV: 91.4 fL (ref 78.0–100.0)
MCV: 92 fL (ref 78.0–100.0)
MCV: 92.8 fL (ref 78.0–100.0)
Platelets: 164 10*3/uL (ref 150–400)
Platelets: 187 10*3/uL (ref 150–400)
Platelets: 229 10*3/uL (ref 150–400)
RBC: 3.5 MIL/uL — ABNORMAL LOW (ref 3.87–5.11)
RDW: 12.8 % (ref 11.5–15.5)
RDW: 13.2 % (ref 11.5–15.5)
RDW: 13.2 % (ref 11.5–15.5)
RDW: 13.3 % (ref 11.5–15.5)
WBC: 8.1 10*3/uL (ref 4.0–10.5)

## 2011-02-28 LAB — BASIC METABOLIC PANEL
BUN: 10 mg/dL (ref 6–23)
BUN: 11 mg/dL (ref 6–23)
CO2: 25 mEq/L (ref 19–32)
CO2: 29 mEq/L (ref 19–32)
CO2: 30 mEq/L (ref 19–32)
Chloride: 102 mEq/L (ref 96–112)
Chloride: 104 mEq/L (ref 96–112)
Creatinine, Ser: 1.04 mg/dL (ref 0.4–1.2)
GFR calc Af Amer: >60 mL/min (ref >60)
GFR calc Af Amer: >60 mL/min (ref >60)
GFR calc Af Amer: >60 mL/min (ref >60)
GFR calc Af Amer: >60 mL/min (ref >60)
GFR calc non Af Amer: 55 mL/min — ABNORMAL LOW (ref >60)
Glucose, Bld: 102 mg/dL — ABNORMAL HIGH (ref 70–99)
Glucose, Bld: 105 mg/dL — ABNORMAL HIGH (ref 70–99)
Glucose, Bld: 116 mg/dL — ABNORMAL HIGH (ref 70–99)
Glucose, Bld: 95 mg/dL (ref 70–99)
Potassium: 4.3 mEq/L (ref 3.5–5.1)
Sodium: 136 mEq/L (ref 135–145)
Sodium: 137 mEq/L (ref 135–145)

## 2011-02-28 LAB — DIFFERENTIAL
Basophils Absolute: 0 10*3/uL (ref 0.0–0.1)
Basophils Relative: 1 % (ref 0–1)
Basophils Relative: 1 % (ref 0–1)
Eosinophils Absolute: 0.2 10*3/uL (ref 0.0–0.7)
Eosinophils Absolute: 0.3 10*3/uL (ref 0.0–0.7)
Eosinophils Relative: 5 % (ref 0–5)
Lymphocytes Relative: 8 % — ABNORMAL LOW (ref 12–46)
Lymphs Abs: 0.6 10*3/uL — ABNORMAL LOW (ref 0.7–4.0)
Monocytes Absolute: 0.5 10*3/uL (ref 0.1–1.0)
Monocytes Absolute: 0.6 10*3/uL (ref 0.1–1.0)
Monocytes Absolute: 0.9 10*3/uL (ref 0.1–1.0)
Monocytes Relative: 8 % (ref 3–12)
Monocytes Relative: 9 % (ref 3–12)
Neutro Abs: 3.9 10*3/uL (ref 1.7–7.7)
Neutro Abs: 5.8 10*3/uL (ref 1.7–7.7)
Neutro Abs: 6.4 10*3/uL (ref 1.7–7.7)
Neutrophils Relative %: 55 % (ref 43–77)
Neutrophils Relative %: 65 % (ref 43–77)
Neutrophils Relative %: 83 % — ABNORMAL HIGH (ref 43–77)

## 2011-02-28 LAB — PROTIME-INR
INR: 1.7 — ABNORMAL HIGH (ref 0.00–1.49)
Prothrombin Time: 19.8 seconds — ABNORMAL HIGH (ref 11.6–15.2)

## 2011-02-28 LAB — CROSSMATCH

## 2011-02-28 LAB — ABO/RH: ABO/RH(D): A POS

## 2011-02-28 LAB — APTT: aPTT: 26 seconds (ref 24–37)

## 2011-03-04 NOTE — Progress Notes (Signed)
Summary: Patient declines MRI right now.  Phone Note Outgoing Call   Call placed by: Waldon Reining,  February 22, 2011 12:34 PM Call placed to: Patient Action Taken: Phone Call Completed Summary of Call: I called to ask the patient if she still wanted to get the MRI of her back, she said that she would hold off because her back was feeling better since she has been in therapy. Patient has [facility], no precert was required for out patient services.

## 2011-03-08 ENCOUNTER — Other Ambulatory Visit: Payer: Self-pay | Admitting: Orthopedic Surgery

## 2011-03-08 DIAGNOSIS — R52 Pain, unspecified: Secondary | ICD-10-CM

## 2011-03-13 LAB — CBC
MCHC: 35.8 g/dL (ref 30.0–36.0)
Platelets: 215 10*3/uL (ref 150–400)
RDW: 12.7 % (ref 11.5–15.5)

## 2011-03-13 LAB — BASIC METABOLIC PANEL
BUN: 13 mg/dL (ref 6–23)
CO2: 29 mEq/L (ref 19–32)
Calcium: 9.7 mg/dL (ref 8.4–10.5)
Creatinine, Ser: 0.71 mg/dL (ref 0.4–1.2)
GFR calc Af Amer: >60 mL/min (ref >60)
Glucose, Bld: 93 mg/dL (ref 70–99)

## 2011-03-15 ENCOUNTER — Telehealth: Payer: Self-pay | Admitting: *Deleted

## 2011-03-15 NOTE — Telephone Encounter (Signed)
I scheduled MRI for WED 03/17/11 at 430pm, lmom for patient to call here to set up her follow up appt and to get information for the mri appt, no precert needed for MRI [employer]

## 2011-03-15 NOTE — Telephone Encounter (Signed)
I made new appt for MRI for Wednesday 03/17/11, [facility] no precert needed for MRI

## 2011-03-15 NOTE — Telephone Encounter (Signed)
Wants to proceed with mri of her L spine

## 2011-03-17 ENCOUNTER — Ambulatory Visit (HOSPITAL_COMMUNITY)
Admission: RE | Admit: 2011-03-17 | Discharge: 2011-03-17 | Disposition: A | Discharge status: Home / Self Care | Payer: PRIVATE HEALTH INSURANCE | Source: Ambulatory Visit | Attending: Orthopedic Surgery | Admitting: Orthopedic Surgery

## 2011-03-17 ENCOUNTER — Ambulatory Visit (HOSPITAL_COMMUNITY): Admission: RE | Admit: 2011-03-17 | Payer: PRIVATE HEALTH INSURANCE | Source: Ambulatory Visit

## 2011-03-17 DIAGNOSIS — M545 Low back pain, unspecified: Secondary | ICD-10-CM | POA: Insufficient documentation

## 2011-03-17 DIAGNOSIS — M4800 Spinal stenosis, site unspecified: Secondary | ICD-10-CM

## 2011-03-17 DIAGNOSIS — M5126 Other intervertebral disc displacement, lumbar region: Secondary | ICD-10-CM | POA: Insufficient documentation

## 2011-03-22 LAB — CBC
Hemoglobin: 14.5 g/dL (ref 12.0–15.0)
RBC: 4.53 MIL/uL (ref 3.87–5.11)
RDW: 13.3 % (ref 11.5–15.5)

## 2011-03-22 LAB — BASIC METABOLIC PANEL
Calcium: 9.7 mg/dL (ref 8.4–10.5)
GFR calc Af Amer: >60 mL/min (ref >60)
GFR calc non Af Amer: >60 mL/min (ref >60)
Sodium: 139 mEq/L (ref 135–145)

## 2011-03-22 LAB — DIFFERENTIAL
Basophils Absolute: 0 10*3/uL (ref 0.0–0.1)
Lymphocytes Relative: 10 % — ABNORMAL LOW (ref 12–46)
Monocytes Absolute: 0.3 10*3/uL (ref 0.1–1.0)
Monocytes Relative: 4 % (ref 3–12)
Neutro Abs: 6.9 10*3/uL (ref 1.7–7.7)

## 2011-04-07 ENCOUNTER — Encounter: Payer: Self-pay | Admitting: Orthopedic Surgery

## 2011-04-07 ENCOUNTER — Ambulatory Visit (INDEPENDENT_AMBULATORY_CARE_PROVIDER_SITE_OTHER): Payer: PRIVATE HEALTH INSURANCE | Admitting: Orthopedic Surgery

## 2011-04-07 ENCOUNTER — Other Ambulatory Visit: Payer: Self-pay | Admitting: Orthopedic Surgery

## 2011-04-07 DIAGNOSIS — M549 Dorsalgia, unspecified: Secondary | ICD-10-CM

## 2011-04-07 DIAGNOSIS — Z96659 Presence of unspecified artificial knee joint: Secondary | ICD-10-CM

## 2011-04-07 NOTE — Progress Notes (Signed)
Followup visit.  Problem #1 UA annual followup for her LEFT total knee arthroplasty with Katr[employer]a [employer] implant. X-rays were done today. X-rays show excellent alignment and no loosening no complications noted.  Also, has MRI to review, which shows L4-L5 disc disease, and possible L5 root impingement, but still seems to bother the patient over the RIGHT hip and gluteal area. Recommend epidural series. Injection. Also, has 3 episodes of RIGHT knee instability, which appears to be associated with the patellofemoral joint. The patient stepped laterally and the knee went into a valgus position and the knee shifted.  Clinical exam shows that there is crepitance in the lateral patellar femoral compartment with tenderness. There. The collateral ligaments were stable. The ACL was intact. PCL was normal. No joint effusion was noted. Knee flexion was full.  Status post LEFT total knee arthroplasty doing well New onset RIGHT patellofemoral joint pain and possible instability. Recommend brace. L4-L5 disc disease with L5 root impingement recommend epi dural series

## 2011-04-09 ENCOUNTER — Other Ambulatory Visit: Payer: Self-pay | Admitting: Orthopedic Surgery

## 2011-04-09 DIAGNOSIS — M549 Dorsalgia, unspecified: Secondary | ICD-10-CM

## 2011-04-12 ENCOUNTER — Telehealth: Payer: Self-pay | Admitting: Radiology

## 2011-04-12 NOTE — Telephone Encounter (Signed)
I faxed a referral for this patient to [facility] for ESI injections at level L4-5.

## 2011-04-13 ENCOUNTER — Ambulatory Visit
Admission: RE | Admit: 2011-04-13 | Discharge: 2011-04-13 | Disposition: A | Discharge status: Home / Self Care | Payer: PRIVATE HEALTH INSURANCE | Source: Ambulatory Visit | Attending: Orthopedic Surgery | Admitting: Orthopedic Surgery

## 2011-04-13 ENCOUNTER — Other Ambulatory Visit: Payer: Self-pay | Admitting: Orthopedic Surgery

## 2011-04-13 DIAGNOSIS — M549 Dorsalgia, unspecified: Secondary | ICD-10-CM

## 2011-04-19 ENCOUNTER — Other Ambulatory Visit: Payer: Self-pay | Admitting: Orthopedic Surgery

## 2011-04-20 NOTE — Op Note (Signed)
Emily [name], Emily [name]                ACCOUNT NO.:  [id number]   MEDICAL RECORD NO.:  [id number]          PATIENT TYPE:  AMB   LOCATION:  DAY                           FACILITY:  [facility]   PHYSICIAN:  Vickki Hearing, M.D.DATE OF BIRTH:  [birth year]   DATE OF PROCEDURE:  DATE OF DISCHARGE:  08/01/2009                               OPERATIVE REPORT   HISTORY:  A 60 year old female who has had left knee pain for several  years.  She started having swelling which increased by the end of her  day and we treated her with Naprosyn.  She was doing well and then  something popped.  Her pain increased.  She had trouble walking, and  Naprosyn and Vicodin did not take care of the pain.  We got an MRI of  her knee and it showed that she had a torn medial meniscus and some  osteoarthritis in the left knee.  Discussion was entertained in the  office.  Treatment options included further nonoperative treatment  versus surgical arthroscopy, and she opted for the latter.  She was  scheduled for arthroscopy of left knee.   PREOPERATIVE DIAGNOSIS:  Medial meniscal tear left knee with  osteoarthritis.   POSTOPERATIVE DIAGNOSIS:  Medial meniscal tear left knee with  osteoarthritis.   PROCEDURE:  Arthroscopy left knee partial medial meniscectomy.   SURGEON:  Vickki Hearing, MD   ASSISTANTS:  None.   ANESTHETIC:  Spinal.   OPERATIVE FINDINGS:  The major finding was a tear of the posterior horn  of the medial meniscus.  There was grade 1 chondral changes on the  tibial plateau on the medial side with a moderate amount of synovitis in  the medial compartment.  There was degeneration of the ACL but it was  intact.  PCL was normal.  There was fraying of the free edge of the  lateral meniscus with no tear, and there was a grade 4 chondral lesion  on the superior lateral trochlea.   DETAILS OF PROCEDURE:  The procedure was done as follows.  The patient  was identified in the preop area, and surgical  site marking was  performed.  The chart was updated, and she was taken to the surgical  suite.  She was given a spinal anesthetic with no complications there.  She was given a gram of Ancef as well and then her left knee was placed  in a leg holder.  Right leg was padded.  After sterile prep and drape,  time-out procedure was completed.   Lateral portal was established.  The scope was introduced through the  lateral portal.  Diagnostic arthroscopy commenced in the medial  compartment and progressed across the knee into the lateral compartment  and then into the suprapatellar pouch.  Operative findings are as  listed.   Through a medial working portal, the meniscus was trimmed with an  upbiter and then meniscal fragments were removed with a motorized  shaver.  The meniscus was then balanced until a stable rim was obtained  with a motorized shaver and a 50-degree ArthroCare wand.  The  meniscus  was reprobed who test its stability.  It was found to be intact with  stable rim.   The knee was then irrigated and closed with Steri-Strips.  We injected  30 mL of Marcaine with epinephrine, applied sterile dressing, Cryo/Cuff,  and she was taken to recovery room in stable condition.  She will be  allowed full weightbearing as tolerated.  She was given Vicodin ES 1 q.4  p.r.n. pain #42 with three refills.  I will follow up with her on  Tuesday.  Her therapy can start Wednesday.      Vickki Hearing, M.D.  Electronically Signed     SEH/MEDQ  D:  08/01/2009  T:  08/02/2009  Job:  [id number]

## 2011-04-21 ENCOUNTER — Other Ambulatory Visit: Payer: Self-pay | Admitting: Orthopedic Surgery

## 2011-04-21 NOTE — Telephone Encounter (Signed)
Patient states back is hurting and requests prescription. Last used Norco 5/325, which she states a recent Rx request was denied.  Her pharmacy is [facility] in [place].  Please advise. Patient work ph is [phone] until 4:00pm today; home ph is [phone].

## 2011-04-22 NOTE — Telephone Encounter (Signed)
Call in norco 5/325  q 4 hrs prn pain # 42 NR

## 2011-04-23 NOTE — Telephone Encounter (Signed)
Phoned in to [facility] pharmacy, [place], [place] [phone].  Called patient and notified.

## 2011-04-26 ENCOUNTER — Other Ambulatory Visit: Payer: Self-pay | Admitting: *Deleted

## 2011-04-26 DIAGNOSIS — R52 Pain, unspecified: Secondary | ICD-10-CM

## 2011-04-26 MED ORDER — HYDROCODONE-ACETAMINOPHEN 5-325 MG PO TABS: 1.0000 | ORAL_TABLET | ORAL | Status: DC | PRN

## 2011-04-27 ENCOUNTER — Other Ambulatory Visit: Payer: Self-pay | Admitting: Orthopedic Surgery

## 2011-04-27 ENCOUNTER — Ambulatory Visit
Admission: RE | Admit: 2011-04-27 | Discharge: 2011-04-27 | Disposition: A | Discharge status: Home / Self Care | Payer: PRIVATE HEALTH INSURANCE | Source: Ambulatory Visit | Attending: Orthopedic Surgery | Admitting: Orthopedic Surgery

## 2011-04-27 DIAGNOSIS — M549 Dorsalgia, unspecified: Secondary | ICD-10-CM

## 2011-05-12 ENCOUNTER — Ambulatory Visit
Admission: RE | Admit: 2011-05-12 | Discharge: 2011-05-12 | Disposition: A | Discharge status: Home / Self Care | Payer: PRIVATE HEALTH INSURANCE | Source: Ambulatory Visit | Attending: Orthopedic Surgery | Admitting: Orthopedic Surgery

## 2011-05-12 DIAGNOSIS — M549 Dorsalgia, unspecified: Secondary | ICD-10-CM

## 2011-06-22 ENCOUNTER — Ambulatory Visit (INDEPENDENT_AMBULATORY_CARE_PROVIDER_SITE_OTHER): Payer: PRIVATE HEALTH INSURANCE | Admitting: Orthopedic Surgery

## 2011-06-22 DIAGNOSIS — M545 Low back pain, unspecified: Secondary | ICD-10-CM

## 2011-06-22 DIAGNOSIS — R52 Pain, unspecified: Secondary | ICD-10-CM

## 2011-06-22 DIAGNOSIS — G8929 Other chronic pain: Secondary | ICD-10-CM | POA: Insufficient documentation

## 2011-06-22 DIAGNOSIS — IMO0002 Reserved for concepts with insufficient information to code with codable children: Secondary | ICD-10-CM

## 2011-06-22 MED ORDER — HYDROCODONE-ACETAMINOPHEN 5-325 MG PO TABS: 1.0000 | ORAL_TABLET | ORAL | Status: DC | PRN

## 2011-06-22 NOTE — Patient Instructions (Signed)
Avoid activities that aggravate the back   Take tylenol or hydrocodone for pain   We will set up the Neurosurgery referral

## 2011-06-22 NOTE — Progress Notes (Signed)
   Followup visit.  Status post epidural series. Injection.  Still having significant amount of pain, which is causing her to have functional activity problems such as ambulation. She will take some Tylenol on occasion and then on a bad day. Will take some hydrocodone 7.5 mg  I spoke to her about this. She is ready to see the neurosurgery.  The MRI report that we have chose the following IMPRESSION:  1. Moderate sized broad-based left paracentral disc protrusion at  L4-L5 contributes to central and left greater than right lateral  recess stenosis. Left L5 nerve root encroachment is likely. Right  L5 nerve root encroachment is possible.  2. No other evidence of right-sided nerve root encroachment. The  right sided foramina are widely patent at all levels.  3. Mild disc bulging eccentric to the left at L2-L3 and L3-L4  without exiting nerve root encroachment.  4. Mild to moderate facet degenerative changes inferiorly.

## 2011-06-23 ENCOUNTER — Other Ambulatory Visit: Payer: Self-pay | Admitting: Orthopedic Surgery

## 2011-06-23 ENCOUNTER — Telehealth: Payer: Self-pay | Admitting: *Deleted

## 2011-06-23 DIAGNOSIS — M4800 Spinal stenosis, site unspecified: Secondary | ICD-10-CM

## 2011-06-23 NOTE — Telephone Encounter (Signed)
Faxed notes to [facility] for back referral

## 2011-06-24 ENCOUNTER — Other Ambulatory Visit: Payer: Self-pay | Admitting: Orthopedic Surgery

## 2011-06-24 ENCOUNTER — Telehealth: Payer: Self-pay | Admitting: Orthopedic Surgery

## 2011-06-24 DIAGNOSIS — M4807 Spinal stenosis, lumbosacral region: Secondary | ICD-10-CM

## 2011-06-24 NOTE — Telephone Encounter (Signed)
I MADE A NEW REFERRAL

## 2011-06-24 NOTE — Telephone Encounter (Signed)
Emily [name] made a new referral

## 2011-06-24 NOTE — Telephone Encounter (Signed)
Haley at [facility] said Roshawna Colclasure was terminated from their practice in 2003 and are not able to schedule her there

## 2011-06-24 NOTE — Telephone Encounter (Signed)
Refer to [facility] intead

## 2011-07-01 ENCOUNTER — Ambulatory Visit (HOSPITAL_COMMUNITY)
Admission: RE | Admit: 2011-07-01 | Discharge: 2011-07-01 | Disposition: A | Discharge status: Home / Self Care | Payer: PRIVATE HEALTH INSURANCE | Source: Ambulatory Visit | Attending: Family Medicine | Admitting: Family Medicine

## 2011-07-01 ENCOUNTER — Other Ambulatory Visit: Payer: Self-pay | Admitting: Family Medicine

## 2011-07-01 ENCOUNTER — Telehealth: Payer: Self-pay | Admitting: Orthopedic Surgery

## 2011-07-01 DIAGNOSIS — M79609 Pain in unspecified limb: Secondary | ICD-10-CM | POA: Insufficient documentation

## 2011-07-01 DIAGNOSIS — R52 Pain, unspecified: Secondary | ICD-10-CM

## 2011-07-01 DIAGNOSIS — M7989 Other specified soft tissue disorders: Secondary | ICD-10-CM | POA: Insufficient documentation

## 2011-07-01 NOTE — Telephone Encounter (Signed)
Emily [name]/[facility] left you a message that Emily [name]  Has an appointment with Dr. Dutch Quint for 07/09/11 at 10:45.  Per Thayer Ohm, the patient is aware of the appointment date/time

## 2011-07-02 ENCOUNTER — Other Ambulatory Visit: Payer: Self-pay | Admitting: Family Medicine

## 2011-07-02 DIAGNOSIS — M79606 Pain in leg, unspecified: Secondary | ICD-10-CM

## 2011-07-06 ENCOUNTER — Telehealth: Payer: Self-pay | Admitting: Orthopedic Surgery

## 2011-07-06 NOTE — Telephone Encounter (Signed)
Emily [name] wants you to know she is to have a spinal fusion of L-4 & L-5  By Dr. Dutch Quint  On 07/26/11

## 2011-07-08 ENCOUNTER — Ambulatory Visit (HOSPITAL_COMMUNITY)
Admission: RE | Admit: 2011-07-08 | Discharge: 2011-07-08 | Disposition: A | Discharge status: Home / Self Care | Payer: PRIVATE HEALTH INSURANCE | Source: Ambulatory Visit | Attending: Family Medicine | Admitting: Family Medicine

## 2011-07-08 DIAGNOSIS — R7989 Other specified abnormal findings of blood chemistry: Secondary | ICD-10-CM | POA: Insufficient documentation

## 2011-07-08 DIAGNOSIS — M79606 Pain in leg, unspecified: Secondary | ICD-10-CM

## 2011-07-08 DIAGNOSIS — M79609 Pain in unspecified limb: Secondary | ICD-10-CM | POA: Insufficient documentation

## 2011-07-21 ENCOUNTER — Other Ambulatory Visit (HOSPITAL_COMMUNITY): Payer: Self-pay | Admitting: Neurosurgery

## 2011-07-21 ENCOUNTER — Encounter (HOSPITAL_COMMUNITY)
Admission: RE | Admit: 2011-07-21 | Discharge: 2011-07-21 | Disposition: A | Discharge status: Home / Self Care | Payer: PRIVATE HEALTH INSURANCE | Source: Ambulatory Visit | Attending: Neurosurgery | Admitting: Neurosurgery

## 2011-07-21 DIAGNOSIS — M48061 Spinal stenosis, lumbar region without neurogenic claudication: Secondary | ICD-10-CM

## 2011-07-21 LAB — DIFFERENTIAL
Eosinophils Relative: 3 % (ref 0–5)
Lymphocytes Relative: 27 % (ref 12–46)
Lymphs Abs: 2 10*3/uL (ref 0.7–4.0)
Neutro Abs: 4.5 10*3/uL (ref 1.7–7.7)

## 2011-07-21 LAB — TYPE AND SCREEN: Antibody Screen: NEGATIVE

## 2011-07-21 LAB — BASIC METABOLIC PANEL
Chloride: 105 mEq/L (ref 96–112)
Creatinine, Ser: 0.69 mg/dL (ref 0.50–1.10)
GFR calc Af Amer: >60 mL/min (ref >60)
Potassium: 4 mEq/L (ref 3.5–5.1)
Sodium: 143 mEq/L (ref 135–145)

## 2011-07-21 LAB — ABO/RH: ABO/RH(D): A POS

## 2011-07-21 LAB — CBC
HCT: 36.5 % (ref 36.0–46.0)
Hemoglobin: 13.4 g/dL (ref 12.0–15.0)
MCV: 89.9 fL (ref 78.0–100.0)
WBC: 7.4 10*3/uL (ref 4.0–10.5)

## 2011-07-27 ENCOUNTER — Inpatient Hospital Stay (HOSPITAL_COMMUNITY): Payer: PRIVATE HEALTH INSURANCE

## 2011-07-27 ENCOUNTER — Inpatient Hospital Stay (HOSPITAL_COMMUNITY)
Admission: RE | Admit: 2011-07-27 | Discharge: 2011-07-28 | DRG: 460 | Disposition: A | Discharge status: Home / Self Care | Payer: PRIVATE HEALTH INSURANCE | Source: Ambulatory Visit | Attending: Neurosurgery | Admitting: Neurosurgery

## 2011-07-27 DIAGNOSIS — M5126 Other intervertebral disc displacement, lumbar region: Principal | ICD-10-CM | POA: Diagnosis present

## 2011-07-27 DIAGNOSIS — G4730 Sleep apnea, unspecified: Secondary | ICD-10-CM | POA: Diagnosis present

## 2011-07-27 DIAGNOSIS — Z888 Allergy status to other drugs, medicaments and biological substances status: Secondary | ICD-10-CM

## 2011-07-27 DIAGNOSIS — Z01812 Encounter for preprocedural laboratory examination: Secondary | ICD-10-CM

## 2011-07-27 DIAGNOSIS — M48061 Spinal stenosis, lumbar region without neurogenic claudication: Secondary | ICD-10-CM | POA: Diagnosis present

## 2011-07-27 DIAGNOSIS — Z882 Allergy status to sulfonamides status: Secondary | ICD-10-CM

## 2011-07-27 DIAGNOSIS — IMO0001 Reserved for inherently not codable concepts without codable children: Secondary | ICD-10-CM | POA: Diagnosis present

## 2011-07-27 DIAGNOSIS — Z886 Allergy status to analgesic agent status: Secondary | ICD-10-CM

## 2011-07-27 DIAGNOSIS — I10 Essential (primary) hypertension: Secondary | ICD-10-CM | POA: Diagnosis present

## 2011-07-27 DIAGNOSIS — K219 Gastro-esophageal reflux disease without esophagitis: Secondary | ICD-10-CM | POA: Diagnosis present

## 2011-07-27 LAB — SURGICAL PCR SCREEN
MRSA, PCR: NEGATIVE
Staphylococcus aureus: NEGATIVE

## 2011-08-02 NOTE — Op Note (Signed)
Emily [name], Emily [name]                ACCOUNT NO.:  [id number]  MEDICAL RECORD NO.:  [id number]  LOCATION:  3535                         FACILITY:  [facility]  PHYSICIAN:  Kathaleen Maser. Brisa Auth, M.D.    DATE OF BIRTH:  08-Jan-1951  DATE OF PROCEDURE:  07/27/2011 DATE OF DISCHARGE:                              OPERATIVE REPORT   PREOPERATIVE DIAGNOSIS:  L4-5 herniated nucleus pulposus with instability and stenosis.  POSTOPERATIVE DIAGNOSIS:  L4-5 herniated nucleus pulposus with instability and stenosis.  PROCEDURE NOTE:  L4-L5 decompressive laminectomy and bilateral L4 and L5 decompressive foraminotomies, more than would be required for simple interbody fusion alone.  L4-5 posterior lumbar interbody fusion utilizing tangent interbody allograft wedge, Telamon interbody cage, and local autografting.  L4-5 posterolateral arthrodesis utilizing nonsegmental pedicle screw fixation and local autografting.  SURGEON:  Kathaleen Maser. Rayme Bui, MD  ASSISTANT:  None.  ANESTHESIA:  General endotracheal.  INDICATIONS:  Emily [name] is a 61 year old female with history of back and bilateral lower extremity pain, right greater than left, failing conservative management.  Workup demonstrates evidence of a left paracentral disk herniation with evidence of segmental instability at L4- 5 and moderately severe foraminal stenosis bilaterally.  The patient has failed conservative management and presents now for decompression and fusion in hopes improving her symptoms.  OPERATIVE NOTE:  The patient was brought to the operating room and placed on operating table in supine position.  After adequate level of anesthesia was achieved, the patient was placed prone on Wilson frame and appropriately padded.  The patient's lumbar region was prepped and draped sterilely.  A 10 blade was used to make a curvilinear skin incision overlying the L4-5 interspace.  This was carried down sharply in the midline.  Subperiosteal dissection was  then performed exposing the lamina facet joints L4 and L5 as well as transverse processes of L4 and L5.  Deep self-retaining retractor was placed.  Intraoperative fluoroscopy view was used and levels were confirmed.  Decompressive laminectomy was then performed using Leksell rongeurs, Kerrison rongeurs, and high-speed drill to remove the entire lamina of L4, inferior facets of L4, superior facets of L5, and superior aspect of lamina of L5.  All bone was cleaned and used in later autograft. Ligamentum flavum was then elevated and resected in piecemeal fashion using Kerrison rongeurs.  The underlying thecal sac was then identified. Wide central decompression was then performed and wide decompressive foraminotomies were then performed along the course of the exiting L4 and L5 nerve roots bilaterally.  Bilateral diskectomies were then performed at L4-5.  Disk space was then distracted up to 8 mm with 8-mm distractor, left on the patient's right side.  Thecal sac and nerve roots were inspected on the left side.  Disk space was then reamed and then cut with 8-mm tangent instrument.  Soft tissue removed from the interspace.  An 8 x 22-mm Telamon cage packed with morselized autograft was then packed into place and recessed roughly 2-mm from posterior cortical margin of L4.  Distractor was removed from the patient's left side.  Thecal sac and nerve roots were inspected on the left side.  Disk space was then reamed and then cut with 8-mm  tangent instrument.  Soft tissues were removed from interspace.  Morselized autograft was packed into interspace for later fusion.  An 8 x 26-mm tangent wedge was then packed into place, recessed roughly 1-2 mm from the posterior cortical margin of L4.  Pedicles of L4 and L5 were then identified using surface landmarks and intraoperative fluoroscopy.  Superficial bone around the pedicle was then removed using high-speed drill.  Each pedicle was then probed using  pedicle awl.  Each pedicle awl track was then tapped with 5.25 mm screw tapper.  Each screw hole was then probed and found to be solidly within bone.  A 5.75 x 45-mm radius screws were placed bilaterally at L4 and 5.75 x 40-mm screws were placed bilaterally at L5. Transverse processes of L4 and L5 were then decorticated using high- speed drill.  Morselized autograft was packed posterolaterally for later fusion.  Short segment of titanium rods were then placed over screw heads at L4 and L5.  Locking caps were engaged and then locking caps were then given a final tightening with construct under compression. Final images revealed good position of bone grafts, hardware at proper operative level, normal alignment of spine.  Wound was then irrigated with antibiotic solution.  Gelfoam was placed topically for hemostasis and found to be good.  A medium Hemovac drain was left in epidural space.  The wound was then closed in layers with Vicryl suture.  Steri- Strips and sterile dressings were applied.  There were no complications. The patient tolerated the procedure well and she returns to the recovery room postoperatively.          ______________________________ Kathaleen Maser Clatie Kessen, M.D.     HAP/MEDQ  D:  07/27/2011  T:  07/27/2011  Job:  [id number]  Electronically Signed by Julio Sicks M.D. on 08/02/2011 07:45:29 AM

## 2011-08-26 ENCOUNTER — Ambulatory Visit
Admission: RE | Admit: 2011-08-26 | Discharge: 2011-08-26 | Disposition: A | Discharge status: Home / Self Care | Payer: PRIVATE HEALTH INSURANCE | Source: Ambulatory Visit | Attending: Neurosurgery | Admitting: Neurosurgery

## 2011-08-26 ENCOUNTER — Other Ambulatory Visit: Payer: Self-pay | Admitting: Neurosurgery

## 2011-08-26 DIAGNOSIS — M48062 Spinal stenosis, lumbar region with neurogenic claudication: Secondary | ICD-10-CM

## 2011-09-02 LAB — DIFFERENTIAL
Basophils Absolute: 0
Lymphocytes Relative: 11 — ABNORMAL LOW
Neutro Abs: 6.6

## 2011-09-02 LAB — URINALYSIS, ROUTINE W REFLEX MICROSCOPIC
Bilirubin Urine: NEGATIVE
Ketones, ur: NEGATIVE
Nitrite: NEGATIVE
Protein, ur: 30 — AB
pH: >9 — ABNORMAL HIGH

## 2011-09-02 LAB — COMPREHENSIVE METABOLIC PANEL
Albumin: 3.9
BUN: 12
CO2: 25
Chloride: 110
Creatinine, Ser: 0.79
GFR calc non Af Amer: >60
Total Bilirubin: 1

## 2011-09-02 LAB — PREGNANCY, URINE: Preg Test, Ur: NEGATIVE

## 2011-09-02 LAB — CBC
HCT: 37
MCV: 91.9
Platelets: 216
WBC: 7.9

## 2011-11-03 ENCOUNTER — Ambulatory Visit
Admission: RE | Admit: 2011-11-03 | Discharge: 2011-11-03 | Disposition: A | Discharge status: Home / Self Care | Payer: PRIVATE HEALTH INSURANCE | Source: Ambulatory Visit | Attending: Neurosurgery | Admitting: Neurosurgery

## 2011-11-03 ENCOUNTER — Other Ambulatory Visit (HOSPITAL_COMMUNITY): Payer: Self-pay | Admitting: Neurosurgery

## 2011-11-03 ENCOUNTER — Other Ambulatory Visit: Payer: Self-pay | Admitting: Neurosurgery

## 2011-11-03 DIAGNOSIS — M48061 Spinal stenosis, lumbar region without neurogenic claudication: Secondary | ICD-10-CM

## 2011-11-03 DIAGNOSIS — M542 Cervicalgia: Secondary | ICD-10-CM

## 2011-11-08 ENCOUNTER — Ambulatory Visit (HOSPITAL_COMMUNITY)
Admission: RE | Admit: 2011-11-08 | Discharge: 2011-11-08 | Disposition: A | Discharge status: Home / Self Care | Payer: PRIVATE HEALTH INSURANCE | Source: Ambulatory Visit | Attending: Neurosurgery | Admitting: Neurosurgery

## 2011-11-08 DIAGNOSIS — M542 Cervicalgia: Secondary | ICD-10-CM | POA: Insufficient documentation

## 2011-11-08 DIAGNOSIS — M5030 Other cervical disc degeneration, unspecified cervical region: Secondary | ICD-10-CM | POA: Insufficient documentation

## 2011-11-08 DIAGNOSIS — R209 Unspecified disturbances of skin sensation: Secondary | ICD-10-CM | POA: Insufficient documentation

## 2011-12-10 ENCOUNTER — Other Ambulatory Visit: Payer: Self-pay | Admitting: Family Medicine

## 2011-12-10 ENCOUNTER — Ambulatory Visit (HOSPITAL_COMMUNITY)
Admission: RE | Admit: 2011-12-10 | Discharge: 2011-12-10 | Disposition: A | Discharge status: Home / Self Care | Payer: PRIVATE HEALTH INSURANCE | Source: Ambulatory Visit | Attending: Family Medicine | Admitting: Family Medicine

## 2011-12-10 DIAGNOSIS — M25519 Pain in unspecified shoulder: Secondary | ICD-10-CM | POA: Insufficient documentation

## 2011-12-10 DIAGNOSIS — M25511 Pain in right shoulder: Secondary | ICD-10-CM

## 2011-12-10 DIAGNOSIS — R937 Abnormal findings on diagnostic imaging of other parts of musculoskeletal system: Secondary | ICD-10-CM | POA: Insufficient documentation

## 2011-12-14 ENCOUNTER — Other Ambulatory Visit: Payer: Self-pay | Admitting: Family Medicine

## 2011-12-14 DIAGNOSIS — M25511 Pain in right shoulder: Secondary | ICD-10-CM

## 2011-12-15 ENCOUNTER — Ambulatory Visit (HOSPITAL_COMMUNITY)
Admission: RE | Admit: 2011-12-15 | Discharge: 2011-12-15 | Disposition: A | Discharge status: Home / Self Care | Payer: PRIVATE HEALTH INSURANCE | Source: Ambulatory Visit | Attending: Family Medicine | Admitting: Family Medicine

## 2011-12-15 ENCOUNTER — Encounter (HOSPITAL_COMMUNITY): Payer: Self-pay

## 2011-12-15 DIAGNOSIS — M25511 Pain in right shoulder: Secondary | ICD-10-CM

## 2011-12-15 DIAGNOSIS — M25519 Pain in unspecified shoulder: Secondary | ICD-10-CM | POA: Insufficient documentation

## 2011-12-15 DIAGNOSIS — M19019 Primary osteoarthritis, unspecified shoulder: Secondary | ICD-10-CM | POA: Insufficient documentation

## 2012-02-10 ENCOUNTER — Other Ambulatory Visit (HOSPITAL_COMMUNITY): Payer: Self-pay | Admitting: Neurosurgery

## 2012-02-10 DIAGNOSIS — M545 Low back pain, unspecified: Secondary | ICD-10-CM

## 2012-02-14 ENCOUNTER — Ambulatory Visit (HOSPITAL_COMMUNITY)
Admission: RE | Admit: 2012-02-14 | Discharge: 2012-02-14 | Disposition: A | Discharge status: Home / Self Care | Payer: PRIVATE HEALTH INSURANCE | Source: Ambulatory Visit | Attending: Neurosurgery | Admitting: Neurosurgery

## 2012-02-14 ENCOUNTER — Other Ambulatory Visit (HOSPITAL_COMMUNITY): Payer: Self-pay | Admitting: Neurosurgery

## 2012-02-14 DIAGNOSIS — Z981 Arthrodesis status: Secondary | ICD-10-CM | POA: Insufficient documentation

## 2012-02-14 DIAGNOSIS — M545 Low back pain, unspecified: Secondary | ICD-10-CM

## 2012-02-14 DIAGNOSIS — M5126 Other intervertebral disc displacement, lumbar region: Secondary | ICD-10-CM | POA: Insufficient documentation

## 2012-06-01 ENCOUNTER — Ambulatory Visit (HOSPITAL_COMMUNITY): Payer: PRIVATE HEALTH INSURANCE | Admitting: Specialist

## 2012-09-10 ENCOUNTER — Encounter (HOSPITAL_COMMUNITY): Payer: Self-pay

## 2012-09-10 ENCOUNTER — Emergency Department (HOSPITAL_COMMUNITY): Payer: Self-pay

## 2012-09-10 ENCOUNTER — Emergency Department (HOSPITAL_COMMUNITY)
Admission: EM | Admit: 2012-09-10 | Discharge: 2012-09-10 | Disposition: A | Discharge status: Home / Self Care | Payer: Self-pay | Attending: Emergency Medicine | Admitting: Emergency Medicine

## 2012-09-10 DIAGNOSIS — I10 Essential (primary) hypertension: Secondary | ICD-10-CM | POA: Insufficient documentation

## 2012-09-10 DIAGNOSIS — R609 Edema, unspecified: Secondary | ICD-10-CM | POA: Insufficient documentation

## 2012-09-10 DIAGNOSIS — I509 Heart failure, unspecified: Secondary | ICD-10-CM | POA: Insufficient documentation

## 2012-09-10 DIAGNOSIS — S52599A Other fractures of lower end of unspecified radius, initial encounter for closed fracture: Secondary | ICD-10-CM | POA: Insufficient documentation

## 2012-09-10 DIAGNOSIS — S52509A Unspecified fracture of the lower end of unspecified radius, initial encounter for closed fracture: Secondary | ICD-10-CM

## 2012-09-10 DIAGNOSIS — W19XXXA Unspecified fall, initial encounter: Secondary | ICD-10-CM | POA: Insufficient documentation

## 2012-09-10 DIAGNOSIS — Z79899 Other long term (current) drug therapy: Secondary | ICD-10-CM | POA: Insufficient documentation

## 2012-09-10 HISTORY — DX: Essential (primary) hypertension: I10

## 2012-09-10 MED ORDER — KETOROLAC TROMETHAMINE 60 MG/2ML IM SOLN
60.0000 mg | Freq: Once | INTRAMUSCULAR | Status: AC
  Administered 2012-09-10: 60 mg via INTRAMUSCULAR
  Filled 2012-09-10: qty 2

## 2012-09-10 MED ORDER — HYDROCODONE-ACETAMINOPHEN 5-325 MG PO TABS
1.0000 | ORAL_TABLET | Freq: Once | ORAL | Status: AC
  Administered 2012-09-10: 1 via ORAL
  Filled 2012-09-10: qty 1

## 2012-09-10 MED ORDER — DIPHENHYDRAMINE HCL 25 MG PO CAPS
25.0000 mg | ORAL_CAPSULE | Freq: Once | ORAL | Status: AC
  Administered 2012-09-10: 25 mg via ORAL
  Filled 2012-09-10: qty 1

## 2012-09-10 MED ORDER — HYDROCODONE-ACETAMINOPHEN 5-325 MG PO TABS: 1.0000 | ORAL_TABLET | ORAL | Status: AC | PRN

## 2012-09-10 NOTE — ED Notes (Signed)
Pt reports that she tripped fell and landed on left knee and right wrist. Incident occurred around 12:30 today and cont. To have pain

## 2012-09-10 NOTE — ED Provider Notes (Signed)
History     CSN: [id number]  Arrival date & time 09/10/12  1434   First MD Initiated Contact with Patient 09/10/12 [phone]      Chief Complaint  Patient presents with  . Fall    (Consider location/radiation/quality/duration/timing/severity/associated sxs/prior treatment) HPI Comments: SONAM WANDEL tripped prior to arrival landing on her left knee and her right outstretched hand.  She has pain at both sites,  But mostly her right wrist.  She has applied ice and used elevation since arrival, but has continued pain with radiation into her distal forearm.  Pain is worse with palpation, movement of the wrist and movement of her fingers,  Thumb particularly.  The history is provided by the patient.    Past Medical History  Diagnosis Date  . CHF (congestive heart failure)   . Acid reflux   . Nerve pain     right leg   . Joint pain   . Hypertension     Past Surgical History  Procedure Date  . Cesarean section   . Bladder repair   . Gallbladder surgery   . Wrist ganglion cyst left   . Knee right (torn cart)   . Tubal ligation   . Vein right leg removed   . Cholecystectomy   . Replacement total knee     left  . Rotator cuff repair     right  . Back surgery     Family History  Problem Relation Age of Onset  . Cancer      family history   . Diabetes      family history   . Coronary artery disease      family history   . Arthritis      family history   . Asthma      History  Substance Use Topics  . Smoking status: Never Smoker   . Smokeless tobacco: Not on file  . Alcohol Use: No    OB History    Grav Para Term Preterm Abortions TAB SAB Ect Mult Living                  Review of Systems  Musculoskeletal: Positive for joint swelling and arthralgias.  Skin: Negative for wound.  Neurological: Negative for weakness and numbness.    Allergies  Codeine; Sulfamethoxazole w-trimethoprim; Oxycodone-acetaminophen; Celebrex; and Hydrocodone-acetaminophen  Home  Medications   Current Outpatient Rx  Name Route Sig Dispense Refill  . CITALOPRAM HYDROBROMIDE 20 MG PO TABS      . ALLEGRA PO Oral Take by mouth.      Marland Kitchen FLUTICASONE PROPIONATE 50 MCG/ACT NA SUSP      . FUROSEMIDE 20 MG PO TABS      . HYDROCODONE-ACETAMINOPHEN 5-325 MG PO TABS Oral Take 1 tablet by mouth every 4 (four) hours as needed for pain. 60 tablet 1  . HYDROCODONE-ACETAMINOPHEN 5-325 MG PO TABS Oral Take 1 tablet by mouth every 4 (four) hours as needed for pain. 15 tablet 0  . KLOR-CON M20 20 MEQ PO TBCR      . METHOCARBAMOL 500 MG PO TABS Oral Take 500 mg by mouth every 6 (six) hours as needed.      Marland Kitchen NABUMETONE 500 MG PO TABS Oral Take 500 mg by mouth 2 (two) times daily.      Marland Kitchen NAPROXEN 500 MG PO TABS Oral Take 500 mg by mouth 2 (two) times daily with a meal.      . RANITIDINE HCL 150 MG PO  CAPS Oral Take 150 mg by mouth 2 (two) times daily.      Marland Kitchen VERAPAMIL HCL ER 180 MG PO TBCR        BP 116/61  Pulse 76  Temp 98.7 F (37.1 C) (Oral)  Resp 18  Ht 5\' 2"  (1.575 m)  Wt 170 lb (77.111 kg)  BMI 31.09 kg/m2  SpO2 99%  Physical Exam  Constitutional: She appears well-developed and well-nourished.  HENT:  Head: Atraumatic.  Neck: Normal range of motion.  Cardiovascular:       Pulses equal bilaterally  Musculoskeletal: She exhibits edema and tenderness.       Right wrist: She exhibits tenderness, bony tenderness and swelling.       Left knee: She exhibits no swelling, no effusion, no deformity, no erythema, no LCL laxity and no MCL laxity. tenderness found. No medial joint line, no lateral joint line, no MCL and no LCL tenderness noted.       Radial pulse intact,  Distal sensation normal.  Less than 3 sec cap refill.  TTP left patella.    Neurological: She is alert. She has normal strength. She displays normal reflexes. No sensory deficit.       Equal strength  Skin: Skin is warm and dry.  Psychiatric: She has a normal mood and affect.    ED Course  Procedures  (including critical care time)  Labs Reviewed - No data to display Dg Wrist Complete Right  09/10/2012  *RADIOLOGY REPORT*  Clinical Data: Fall.  Wrist pain.  RIGHT WRIST - COMPLETE 3+ VIEW  Comparison: None.  Findings: Four views are performed of the right wrist, showing comminuted fracture of the distal radius, involving the articular surface.  Distal ulna appears intact.  There are mild degenerate changes in the first carpometacarpal joint.  Intercarpal spaces appear normal.  IMPRESSION: Fracture of the distal radius.   Original Report Authenticated By: Patterson Hammersmith, M.D.    Dg Knee Complete 4 Views Left  09/10/2012  *RADIOLOGY REPORT*  Clinical Data: Knee pain status post fall.  LEFT KNEE - COMPLETE 4+ VIEW  Comparison: 02/13/2010.  Findings: Patient is status post total knee arthroplasty.  The hardware appears well seated.  There is no evidence of acute fracture, dislocation or significant knee joint effusion.  IMPRESSION: No acute osseous findings status post total knee arthroplasty.   Original Report Authenticated By: Gerrianne Scale, M.D.      1. Distal radial fracture    Sugar tong splint,  Sling applied by RN.  Examined post application,  Cap refill less than 3 sec.  Movement distally intact.   MDM  Call placed to Dr. Hilda Lias - no return call.  Will refer pt to Dr. Hilda Lias - advised pt to call in am for appt time.  Oxycodone prescribed.        Burgess Amor, PA 09/10/12 2340

## 2012-09-11 NOTE — ED Provider Notes (Signed)
Medical screening examination/treatment/procedure(s) were performed by non-physician practitioner and as supervising physician I was immediately available for consultation/collaboration.   Rooney Swails, MD 09/11/12 0020 

## 2012-09-14 ENCOUNTER — Other Ambulatory Visit (HOSPITAL_COMMUNITY): Payer: Self-pay | Admitting: Orthopaedic Surgery

## 2012-09-14 ENCOUNTER — Ambulatory Visit (HOSPITAL_COMMUNITY)
Admission: RE | Admit: 2012-09-14 | Discharge: 2012-09-14 | Disposition: A | Discharge status: Home / Self Care | Payer: Self-pay | Source: Ambulatory Visit | Attending: Orthopaedic Surgery | Admitting: Orthopaedic Surgery

## 2012-09-14 DIAGNOSIS — S5290XA Unspecified fracture of unspecified forearm, initial encounter for closed fracture: Secondary | ICD-10-CM | POA: Insufficient documentation

## 2012-09-14 DIAGNOSIS — T148XXA Other injury of unspecified body region, initial encounter: Secondary | ICD-10-CM

## 2012-09-14 DIAGNOSIS — X58XXXA Exposure to other specified factors, initial encounter: Secondary | ICD-10-CM | POA: Insufficient documentation

## 2012-10-05 ENCOUNTER — Ambulatory Visit (HOSPITAL_COMMUNITY)
Admission: RE | Admit: 2012-10-05 | Discharge: 2012-10-05 | Disposition: A | Discharge status: Home / Self Care | Payer: Self-pay | Source: Ambulatory Visit | Attending: Orthopaedic Surgery | Admitting: Orthopaedic Surgery

## 2012-10-05 ENCOUNTER — Other Ambulatory Visit (HOSPITAL_COMMUNITY): Payer: Self-pay | Admitting: Orthopaedic Surgery

## 2012-10-05 DIAGNOSIS — S52599A Other fractures of lower end of unspecified radius, initial encounter for closed fracture: Secondary | ICD-10-CM | POA: Insufficient documentation

## 2012-10-05 DIAGNOSIS — S52509A Unspecified fracture of the lower end of unspecified radius, initial encounter for closed fracture: Secondary | ICD-10-CM

## 2012-10-05 DIAGNOSIS — X58XXXA Exposure to other specified factors, initial encounter: Secondary | ICD-10-CM | POA: Insufficient documentation

## 2012-10-19 ENCOUNTER — Ambulatory Visit (HOSPITAL_COMMUNITY)
Admission: RE | Admit: 2012-10-19 | Discharge: 2012-10-19 | Disposition: A | Discharge status: Home / Self Care | Payer: Self-pay | Source: Ambulatory Visit | Attending: Orthopaedic Surgery | Admitting: Orthopaedic Surgery

## 2012-10-19 ENCOUNTER — Other Ambulatory Visit (HOSPITAL_COMMUNITY): Payer: Self-pay | Admitting: Orthopaedic Surgery

## 2012-10-19 DIAGNOSIS — Z4789 Encounter for other orthopedic aftercare: Secondary | ICD-10-CM | POA: Insufficient documentation

## 2012-10-19 DIAGNOSIS — T148XXA Other injury of unspecified body region, initial encounter: Secondary | ICD-10-CM

## 2012-11-15 ENCOUNTER — Other Ambulatory Visit (HOSPITAL_COMMUNITY): Payer: Self-pay | Admitting: Neurosurgery

## 2012-11-15 DIAGNOSIS — M48061 Spinal stenosis, lumbar region without neurogenic claudication: Secondary | ICD-10-CM

## 2012-11-20 ENCOUNTER — Ambulatory Visit (HOSPITAL_COMMUNITY)
Admission: RE | Admit: 2012-11-20 | Discharge: 2012-11-20 | Disposition: A | Discharge status: Home / Self Care | Payer: Self-pay | Source: Ambulatory Visit | Attending: Neurosurgery | Admitting: Neurosurgery

## 2012-11-20 ENCOUNTER — Other Ambulatory Visit (HOSPITAL_COMMUNITY): Payer: Self-pay | Admitting: Neurosurgery

## 2012-11-20 ENCOUNTER — Ambulatory Visit
Admission: RE | Admit: 2012-11-20 | Discharge: 2012-11-20 | Disposition: A | Discharge status: Home / Self Care | Payer: Self-pay | Source: Ambulatory Visit | Attending: Neurosurgery | Admitting: Neurosurgery

## 2012-11-20 DIAGNOSIS — M48061 Spinal stenosis, lumbar region without neurogenic claudication: Secondary | ICD-10-CM

## 2012-11-20 DIAGNOSIS — M5126 Other intervertebral disc displacement, lumbar region: Secondary | ICD-10-CM | POA: Insufficient documentation

## 2012-11-20 MED ORDER — GADOBENATE DIMEGLUMINE 529 MG/ML IV SOLN
15.0000 mL | Freq: Once | INTRAVENOUS | Status: AC | PRN
  Administered 2012-11-20: 15 mL via INTRAVENOUS

## 2013-02-08 ENCOUNTER — Encounter: Payer: Self-pay | Admitting: Orthopedic Surgery

## 2013-02-08 ENCOUNTER — Ambulatory Visit (INDEPENDENT_AMBULATORY_CARE_PROVIDER_SITE_OTHER): Payer: Self-pay | Admitting: Orthopedic Surgery

## 2013-02-08 VITALS — BP 134/76 | Ht 62.0 in | Wt 180.0 lb

## 2013-02-08 DIAGNOSIS — M7630 Iliotibial band syndrome, unspecified leg: Secondary | ICD-10-CM

## 2013-02-08 DIAGNOSIS — M6580 Other synovitis and tenosynovitis, unspecified site: Secondary | ICD-10-CM

## 2013-02-08 DIAGNOSIS — M6530 Trigger finger, unspecified finger: Secondary | ICD-10-CM | POA: Insufficient documentation

## 2013-02-08 NOTE — Patient Instructions (Addendum)
You have received a steroid shot. 15% of patients experience increased pain at the injection site with in the next 24 hours. This is best treated with ice and tylenol extra strength 2 tabs every 8 hours. If you are still having pain please call the office.    

## 2013-02-08 NOTE — Progress Notes (Signed)
Patient ID: Emily [name], female   DOB: 12/09/1950, 62 y.o.   MRN: [id number] Chief Complaint  Patient presents with  . Knee Pain    Left knee pain     History 33-year-old female who had a left total knee arthroplasty several years ago and has really been through it  last seen her. She had a back fusion in 2012 which was complicated by a herniated disc at another level and this will require surgery soon. She's also had a rotator cuff tear and repair in her wrist fracture on the right. She presents now for left knee pain and triggering of the right ring finger. Her knee symptoms started about 3 weeks ago came on gradually and are located on the lateral joint line along the iliotibial band this is a dull 5/10 pain which is worse after stairclimbing and walking inclines. She seemed to get better taking her Norco and taking it easy  As far as her finger does go symptoms started suddenly and are associated with pain over the A1 pulley and catching and locking of the finger  Her review of systems Notes weight gain and watering of the eyes snoring and heartburn joint pain and instability stiffness of the joints and muscle pain she also reports numbness and tingling with unsteady gait. She reports anxiety and depression as well as seasonal allergies. All other systems reviewed and were normal.  Past Medical History  Diagnosis Date  . CHF (congestive heart failure)   . Acid reflux   . Nerve pain     right leg   . Joint pain   . Hypertension   . Depression   . Ruptured lumbar disc    Past Surgical History  Procedure Laterality Date  . Cesarean section    . Bladder repair    . Gallbladder surgery    . Wrist ganglion cyst left    . Knee right (torn cart)    . Tubal ligation    . Vein right leg removed    . Cholecystectomy    . Replacement total knee      left  . Rotator cuff repair      right  . Back surgery      BP 134/76  Ht 5\' 2"  (1.575 m)  Wt 180 lb (81.647 kg)  BMI 32.91  kg/m2 General appearance she is well-developed and well-nourished slightly overweight she is oriented x3 with normal mood she ambulates with no assistive device in a limp  Her right hand shows tenderness over the A1 pulley there is catching and locking but full range of motion of the right ring finger. Stability no evidence of instability strength normal skin normal pulse normal color normal sensation normal  The left knee shows a well-healed anterior incision with tenderness over the iliotibial band range of motion is 115. The prostate joint is stable motor exam is normal skin is intact incision is clean pulses are normal and sensation is normal.  X-ray report was reviewed no evidence of fracture and a stable total knee was noted in October 2013  Impression Trigger finger, acquired - Right ring finger  Iliotibial band tendonitis - Left knee distally  Recommend to injections one in the left knee and 1 for the triggering of the right ring finger  The first injection is in the right ring finger A1 pulleys at the flexor tendon sheath CPT code 11914 Procedure note trigger finger injection  Diagnosis trigger finger Postop diagnosis trigger finger Procedure injection of  trigger finger Finger injected  right ring finger   Details of procedure: After verbal consent and timeout to confirm site the right ring  finger was injected with 1 cc of 40 mg of Depo-Medrol and 1 cc of 1% lidocaine   The procedure was tolerated well without complication  The second injection is in the tendon insertion of the iliotibial band 16[address]  Procedure note injection iliotibial band tendon insertion  Diagnosis iliotibial band tendinitis  Procedure injection iliotibial band left knee  Details after verbal consent and timeout to confirm the site the left iliotibial band tibial insertion was injected with 1 cc of 40 mg of Depo-Medrol and 1 cc of 1% lidocaine. The procedure was tolerated well without complication

## 2013-03-01 IMAGING — US US EXTREM LOW VENOUS*R*
1 series · 14 of 24 positions shown · non-contrast
Comparison: None

CLINICAL DATA: Right lower extremity pain and swelling.

RIGHT LOWER EXTREMITY VENOUS DUPLEX ULTRASOUND
TECHNIQUE: Gray-scale sonography with graded compression, as well
as color Doppler and duplex ultrasound, were performed to evaluate
the deep venous system of the lower extremity from the level of the
common femoral vein through the popliteal and proximal calf veins.
Spectral Doppler was utilized to evaluate flow at rest and with
distal augmentation maneuvers.

[Series 1: us extrem low venous*right* · 14 of 26 slices shown]
[im 1/26]
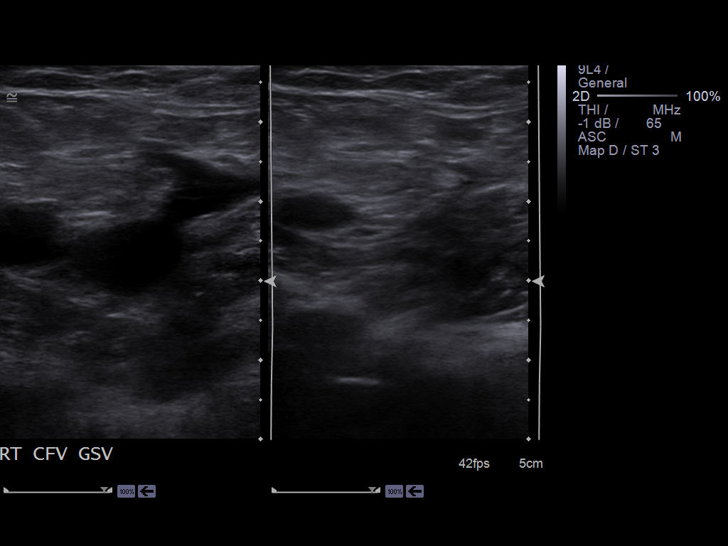
[im 3/26]
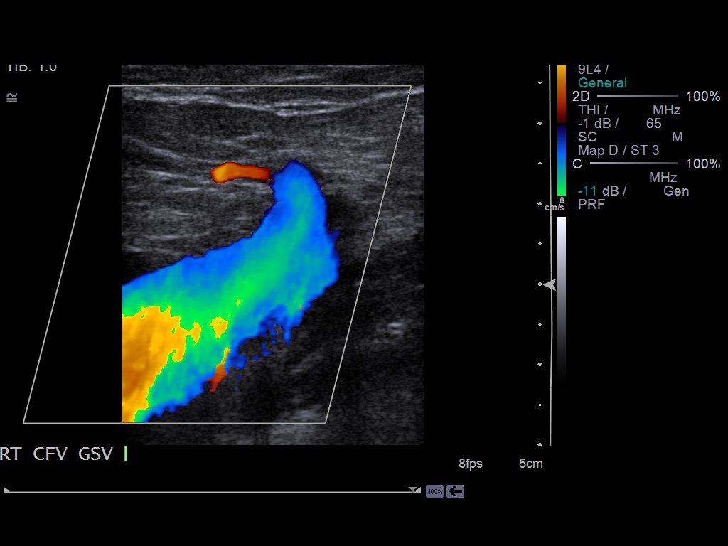
[im 5/26]
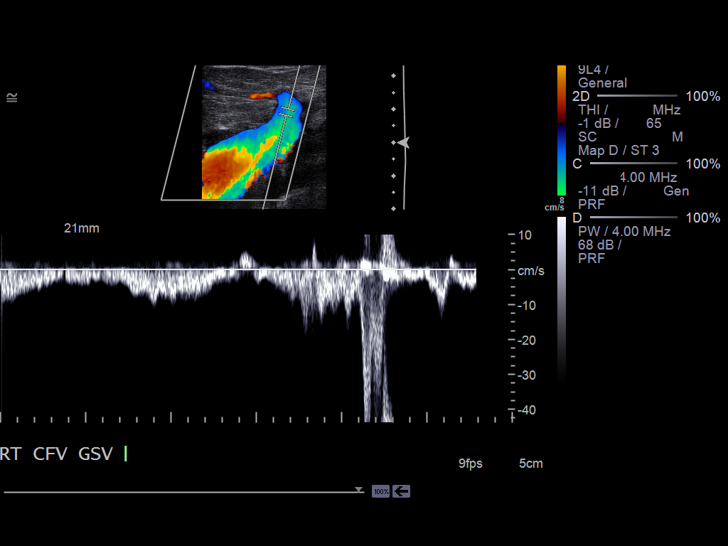
[im 7/26]
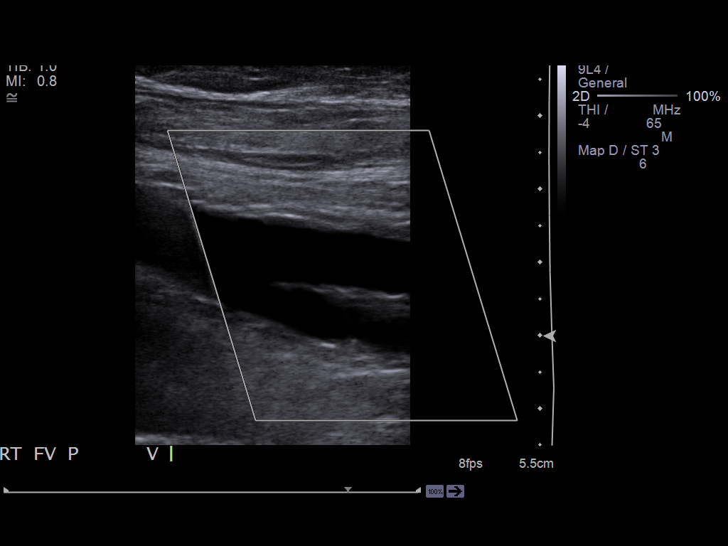
[im 8/26]
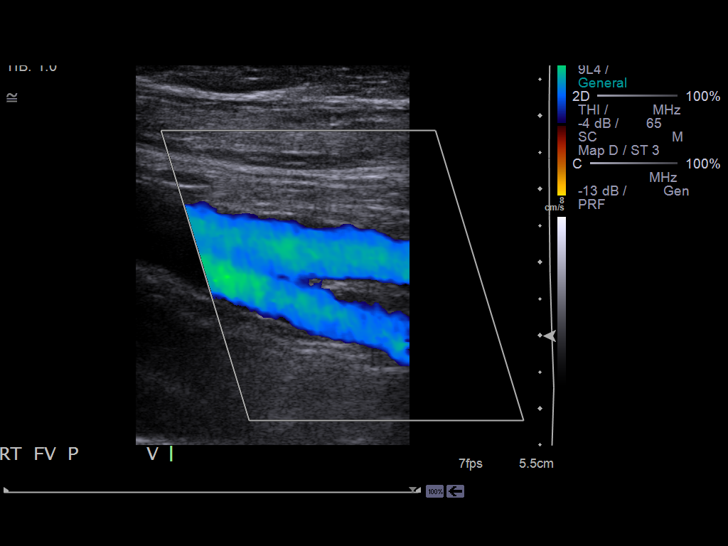
[im 10/26]
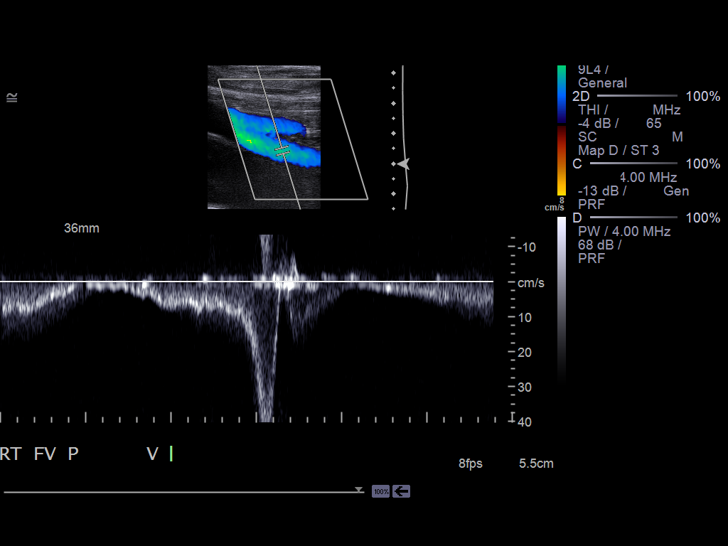
[im 12/26]
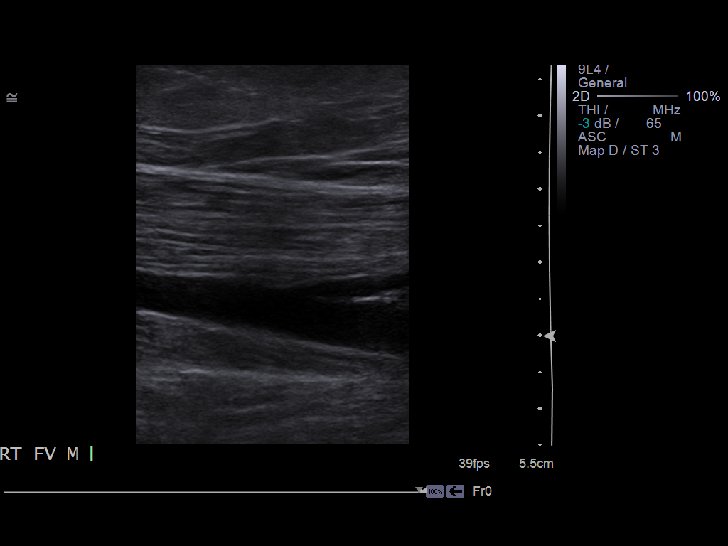
[im 14/26]
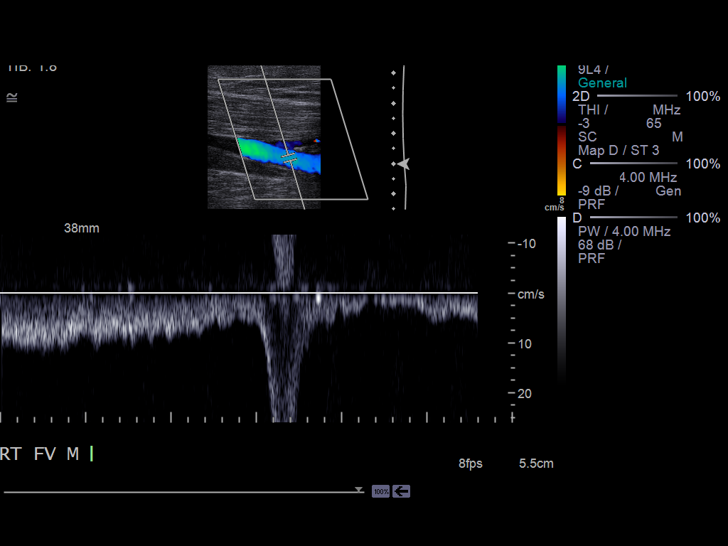
[im 16/26]
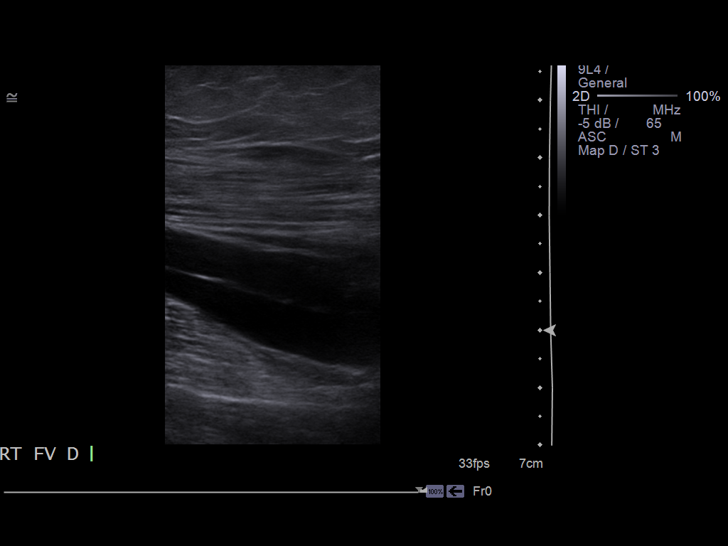
[im 18/26]
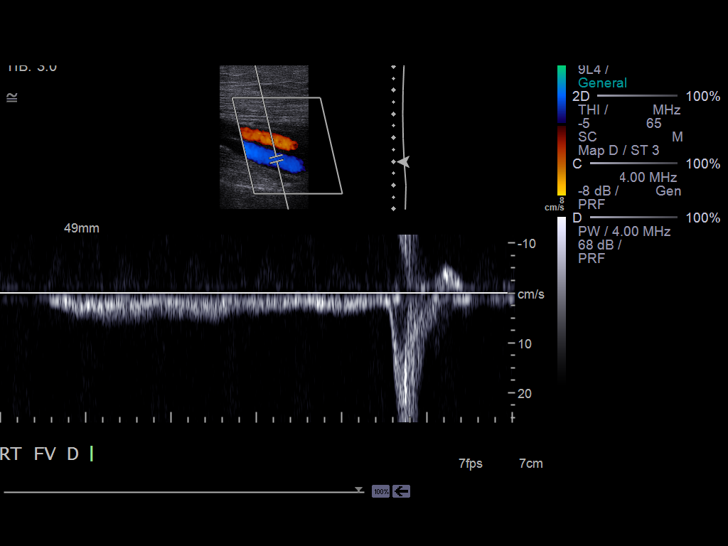
[im 20/26]
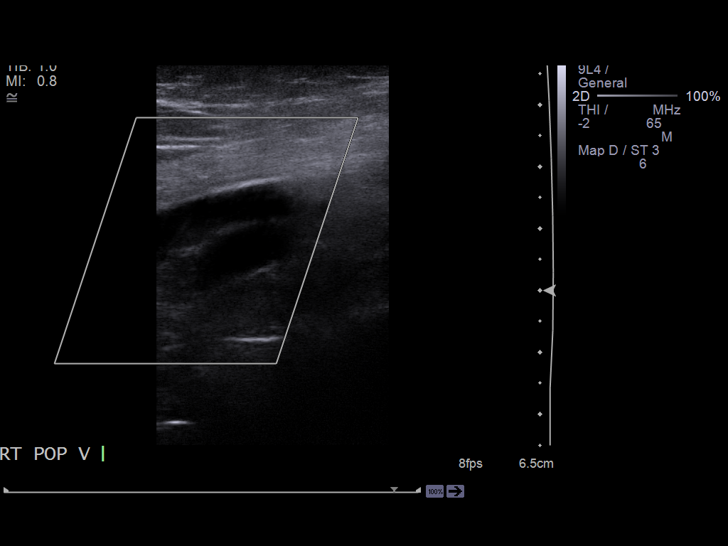
[im 21/26]
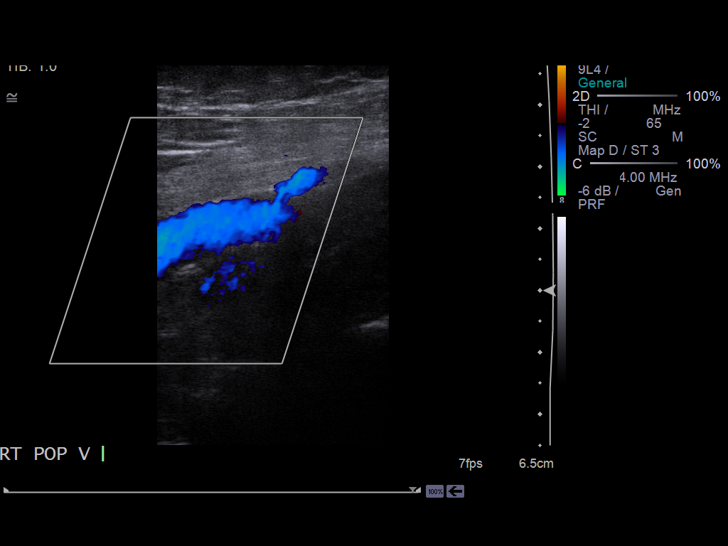
[im 23/26]
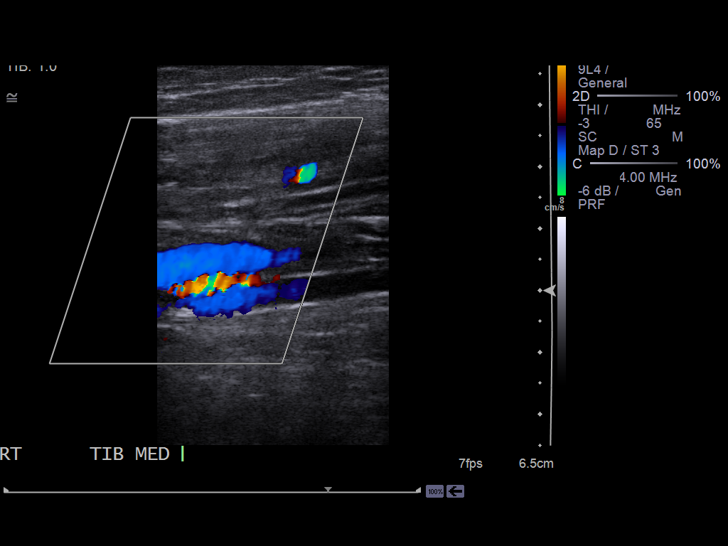
[im 26/26]
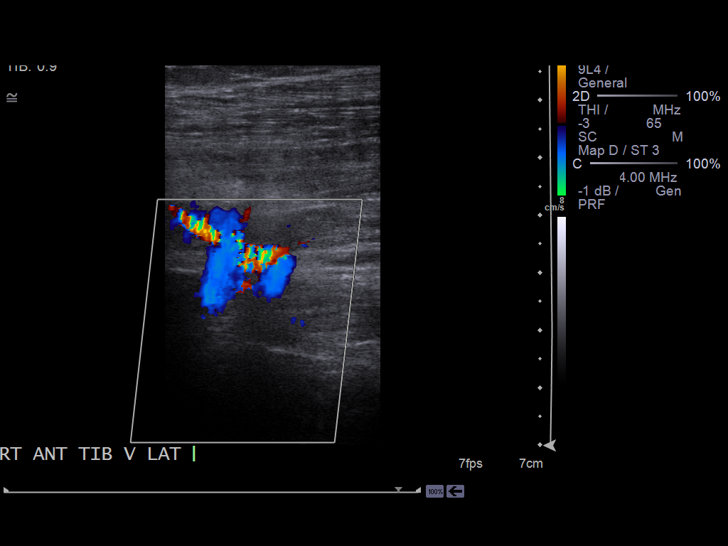

[14 of 24 positions shown; findings below may reference images not displayed]

FINDINGS: There is patent flow in the common femoral, profunda
femoral, superficial femoral and popliteal veins.  These vessels
were completely compressible and demonstrate increased flow with
augmentation.
IMPRESSION: Negative venous Doppler examination for deep venous thrombosis in
the right lower extremity.

## 2013-03-23 ENCOUNTER — Other Ambulatory Visit (HOSPITAL_COMMUNITY): Payer: Self-pay | Admitting: Family Medicine

## 2013-03-25 ENCOUNTER — Other Ambulatory Visit (HOSPITAL_COMMUNITY): Payer: Self-pay | Admitting: Family Medicine

## 2013-03-26 NOTE — Telephone Encounter (Signed)
One refill, needs follow up ov due to pain med use requires at least every 3-4 months ov

## 2013-03-26 NOTE — Telephone Encounter (Signed)
Refill on hydrocodone last seen in office 12/12/12

## 2013-03-27 ENCOUNTER — Other Ambulatory Visit: Payer: Self-pay | Admitting: Neurosurgery

## 2013-03-27 NOTE — Telephone Encounter (Signed)
Med called into [facility].

## 2013-04-02 NOTE — Pre-Procedure Instructions (Signed)
Emily [name]  04/02/2013   Your procedure is scheduled on:  May 6  Report to [facility] at 09:00 AM.  Call this number if you have problems the morning of surgery: [phone]   Remember:   Do not eat food or drink liquids after midnight.   Take these medicines the morning of surgery with A SIP OF WATER: Acetaminophen or hydrocodone (if needed), Xanax (if needed), Celexa, Allegra (if needed), Flonase, Zantac, Verapamil   Do not wear jewelry, make-up or nail polish.  Do not wear lotions, powders, or perfumes. You may wear deodorant.  Do not shave 48 hours prior to surgery. Men may shave face and neck.  Do not bring valuables to the hospital.  Contacts, dentures or bridgework may not be worn into surgery.  Leave suitcase in the car. After surgery it may be brought to your room.  For patients admitted to the hospital, checkout time is 11:00 AM the day of discharge.   Special Instructions: Shower using CHG 2 nights before surgery and the night before surgery.  If you shower the day of surgery use CHG.  Use special wash - you have one bottle of CHG for all showers.  You should use approximately 1/3 of the bottle for each shower.   Please read over the following fact sheets that you were given: Pain Booklet, Coughing and Deep Breathing, Blood Transfusion Information and Surgical Site Infection Prevention

## 2013-04-03 ENCOUNTER — Encounter (HOSPITAL_COMMUNITY): Payer: Self-pay

## 2013-04-03 ENCOUNTER — Encounter (HOSPITAL_COMMUNITY)
Admission: RE | Admit: 2013-04-03 | Discharge: 2013-04-03 | Disposition: A | Discharge status: Home / Self Care | Payer: Medicaid Other | Source: Ambulatory Visit | Attending: Anesthesiology | Admitting: Anesthesiology

## 2013-04-03 ENCOUNTER — Encounter (HOSPITAL_COMMUNITY)
Admission: RE | Admit: 2013-04-03 | Discharge: 2013-04-03 | Disposition: A | Discharge status: Home / Self Care | Payer: Medicaid Other | Source: Ambulatory Visit | Attending: Neurosurgery | Admitting: Neurosurgery

## 2013-04-03 HISTORY — DX: Anxiety disorder, unspecified: F41.9

## 2013-04-03 HISTORY — DX: Sleep apnea, unspecified: G47.30

## 2013-04-03 HISTORY — DX: Adverse effect of unspecified anesthetic, initial encounter: T41.45XA

## 2013-04-03 HISTORY — DX: Other complications of anesthesia, initial encounter: T88.59XA

## 2013-04-03 HISTORY — DX: Personal history of other specified conditions: Z87.898

## 2013-04-03 HISTORY — DX: Reserved for concepts with insufficient information to code with codable children: IMO0002

## 2013-04-03 LAB — CBC WITH DIFFERENTIAL/PLATELET
Basophils Relative: 1 % (ref 0–1)
Eosinophils Absolute: 0.2 10*3/uL (ref 0.0–0.7)
MCH: 32.1 pg (ref 26.0–34.0)
MCHC: 36.1 g/dL — ABNORMAL HIGH (ref 30.0–36.0)
Monocytes Relative: 8 % (ref 3–12)
Neutrophils Relative %: 60 % (ref 43–77)
Platelets: 199 10*3/uL (ref 150–400)
RDW: 12.9 % (ref 11.5–15.5)

## 2013-04-03 LAB — BASIC METABOLIC PANEL
BUN: 10 mg/dL (ref 6–23)
Chloride: 104 mEq/L (ref 96–112)
Creatinine, Ser: 0.65 mg/dL (ref 0.50–1.10)
GFR calc Af Amer: >90 mL/min (ref >90)

## 2013-04-03 LAB — TYPE AND SCREEN: Antibody Screen: NEGATIVE

## 2013-04-09 ENCOUNTER — Other Ambulatory Visit: Payer: Self-pay | Admitting: *Deleted

## 2013-04-09 MED ORDER — CEFAZOLIN SODIUM-DEXTROSE 2-3 GM-% IV SOLR
2.0000 g | INTRAVENOUS | Status: AC
  Administered 2013-04-10: 2 g via INTRAVENOUS
  Filled 2013-04-09: qty 50

## 2013-04-09 MED ORDER — DEXAMETHASONE SODIUM PHOSPHATE 10 MG/ML IJ SOLN
10.0000 mg | INTRAMUSCULAR | Status: AC
  Administered 2013-04-10: 10 mg via INTRAVENOUS
  Filled 2013-04-09: qty 1

## 2013-04-10 ENCOUNTER — Encounter (HOSPITAL_COMMUNITY): Payer: Self-pay | Admitting: Anesthesiology

## 2013-04-10 ENCOUNTER — Ambulatory Visit (HOSPITAL_COMMUNITY): Payer: Medicaid Other | Admitting: Anesthesiology

## 2013-04-10 ENCOUNTER — Ambulatory Visit (HOSPITAL_COMMUNITY): Payer: Medicaid Other

## 2013-04-10 ENCOUNTER — Encounter (HOSPITAL_COMMUNITY)
Admission: RE | Disposition: A | Discharge status: Home / Self Care | Payer: Self-pay | Source: Ambulatory Visit | Attending: Neurosurgery

## 2013-04-10 ENCOUNTER — Inpatient Hospital Stay (HOSPITAL_COMMUNITY)
Admission: RE | Admit: 2013-04-10 | Discharge: 2013-04-11 | DRG: 460 | Disposition: A | Discharge status: Home / Self Care | Payer: Medicaid Other | Source: Ambulatory Visit | Attending: Neurosurgery | Admitting: Neurosurgery

## 2013-04-10 ENCOUNTER — Encounter (HOSPITAL_COMMUNITY): Payer: Self-pay | Admitting: *Deleted

## 2013-04-10 DIAGNOSIS — M5126 Other intervertebral disc displacement, lumbar region: Principal | ICD-10-CM | POA: Diagnosis present

## 2013-04-10 DIAGNOSIS — G4730 Sleep apnea, unspecified: Secondary | ICD-10-CM | POA: Diagnosis present

## 2013-04-10 DIAGNOSIS — M48061 Spinal stenosis, lumbar region without neurogenic claudication: Secondary | ICD-10-CM | POA: Diagnosis present

## 2013-04-10 DIAGNOSIS — K219 Gastro-esophageal reflux disease without esophagitis: Secondary | ICD-10-CM | POA: Diagnosis present

## 2013-04-10 DIAGNOSIS — I509 Heart failure, unspecified: Secondary | ICD-10-CM | POA: Diagnosis present

## 2013-04-10 SURGERY — POSTERIOR LUMBAR FUSION 1 LEVEL
Anesthesia: General | Site: Back

## 2013-04-10 MED ORDER — LACTATED RINGERS IV SOLN
INTRAVENOUS | Status: DC | PRN
  Administered 2013-04-10 (×2): via INTRAVENOUS

## 2013-04-10 MED ORDER — ZOLPIDEM TARTRATE 5 MG PO TABS: 5.0000 mg | ORAL_TABLET | Freq: Every evening | ORAL | Status: DC | PRN

## 2013-04-10 MED ORDER — HYDROMORPHONE HCL PF 1 MG/ML IJ SOLN: 0.5000 mg | INTRAMUSCULAR | Status: DC | PRN

## 2013-04-10 MED ORDER — VERAPAMIL HCL ER 180 MG PO TBCR
180.0000 mg | EXTENDED_RELEASE_TABLET | Freq: Every day | ORAL | Status: DC
  Filled 2013-04-10: qty 1

## 2013-04-10 MED ORDER — PHENOL 1.4 % MT LIQD: 1.0000 | OROMUCOSAL | Status: DC | PRN

## 2013-04-10 MED ORDER — SODIUM CHLORIDE 0.9 % IV SOLN: 250.0000 mL | INTRAVENOUS | Status: DC

## 2013-04-10 MED ORDER — POLYETHYLENE GLYCOL 3350 17 G PO PACK
17.0000 g | PACK | Freq: Every day | ORAL | Status: DC | PRN
  Filled 2013-04-10: qty 1

## 2013-04-10 MED ORDER — PHENYLEPHRINE HCL 10 MG/ML IJ SOLN
INTRAMUSCULAR | Status: DC | PRN
  Administered 2013-04-10: 40 ug via INTRAVENOUS
  Administered 2013-04-10 (×4): 80 ug via INTRAVENOUS
  Administered 2013-04-10: 40 ug via INTRAVENOUS

## 2013-04-10 MED ORDER — EPHEDRINE SULFATE 50 MG/ML IJ SOLN
INTRAMUSCULAR | Status: DC | PRN
  Administered 2013-04-10 (×2): 10 mg via INTRAVENOUS

## 2013-04-10 MED ORDER — HYDROMORPHONE HCL PF 1 MG/ML IJ SOLN
0.2500 mg | INTRAMUSCULAR | Status: DC | PRN
  Administered 2013-04-10 (×3): 0.5 mg via INTRAVENOUS

## 2013-04-10 MED ORDER — MENTHOL 3 MG MT LOZG: 1.0000 | LOZENGE | OROMUCOSAL | Status: DC | PRN

## 2013-04-10 MED ORDER — BISACODYL 10 MG RE SUPP: 10.0000 mg | Freq: Every day | RECTAL | Status: DC | PRN

## 2013-04-10 MED ORDER — ACETAMINOPHEN 650 MG RE SUPP: 650.0000 mg | RECTAL | Status: DC | PRN

## 2013-04-10 MED ORDER — SENNA 8.6 MG PO TABS
1.0000 | ORAL_TABLET | Freq: Two times a day (BID) | ORAL | Status: DC
  Administered 2013-04-10: 8.6 mg via ORAL
  Filled 2013-04-10 (×3): qty 1

## 2013-04-10 MED ORDER — NEOSTIGMINE METHYLSULFATE 1 MG/ML IJ SOLN
INTRAMUSCULAR | Status: DC | PRN
  Administered 2013-04-10: 4 mg via INTRAVENOUS

## 2013-04-10 MED ORDER — MIDAZOLAM HCL 5 MG/5ML IJ SOLN
INTRAMUSCULAR | Status: DC | PRN
  Administered 2013-04-10: 1 mg via INTRAVENOUS

## 2013-04-10 MED ORDER — CITALOPRAM HYDROBROMIDE 20 MG PO TABS
20.0000 mg | ORAL_TABLET | Freq: Every day | ORAL | Status: DC
  Administered 2013-04-10: 20 mg via ORAL
  Filled 2013-04-10 (×2): qty 1

## 2013-04-10 MED ORDER — ONDANSETRON HCL 4 MG/2ML IJ SOLN
INTRAMUSCULAR | Status: DC | PRN
  Administered 2013-04-10: 4 mg via INTRAVENOUS

## 2013-04-10 MED ORDER — GEMFIBROZIL 600 MG PO TABS
600.0000 mg | ORAL_TABLET | Freq: Two times a day (BID) | ORAL | Status: DC
  Administered 2013-04-11: 600 mg via ORAL
  Filled 2013-04-10 (×3): qty 1

## 2013-04-10 MED ORDER — POTASSIUM CHLORIDE CRYS ER 20 MEQ PO TBCR
20.0000 meq | EXTENDED_RELEASE_TABLET | Freq: Every day | ORAL | Status: DC
  Administered 2013-04-10: 20 meq via ORAL
  Filled 2013-04-10 (×2): qty 1

## 2013-04-10 MED ORDER — SODIUM CHLORIDE 0.9 % IJ SOLN
3.0000 mL | Freq: Two times a day (BID) | INTRAMUSCULAR | Status: DC
  Administered 2013-04-10: 3 mL via INTRAVENOUS

## 2013-04-10 MED ORDER — SODIUM CHLORIDE 0.9 % IR SOLN
Status: DC | PRN
  Administered 2013-04-10: 13:00:00

## 2013-04-10 MED ORDER — ROCURONIUM BROMIDE 100 MG/10ML IV SOLN
INTRAVENOUS | Status: DC | PRN
  Administered 2013-04-10: 50 mg via INTRAVENOUS
  Administered 2013-04-10: 10 mg via INTRAVENOUS
  Administered 2013-04-10: 20 mg via INTRAVENOUS

## 2013-04-10 MED ORDER — FUROSEMIDE 40 MG PO TABS
40.0000 mg | ORAL_TABLET | Freq: Every day | ORAL | Status: DC | PRN
  Filled 2013-04-10: qty 1

## 2013-04-10 MED ORDER — HYDROMORPHONE HCL PF 1 MG/ML IJ SOLN
INTRAMUSCULAR | Status: AC
  Filled 2013-04-10: qty 1

## 2013-04-10 MED ORDER — ONDANSETRON HCL 4 MG/2ML IJ SOLN: 4.0000 mg | INTRAMUSCULAR | Status: DC | PRN

## 2013-04-10 MED ORDER — THROMBIN 20000 UNITS EX SOLR
CUTANEOUS | Status: DC | PRN
  Administered 2013-04-10: 13:00:00 via TOPICAL

## 2013-04-10 MED ORDER — FUROSEMIDE 20 MG PO TABS: 20.0000 mg | ORAL_TABLET | ORAL | Status: DC

## 2013-04-10 MED ORDER — BUPIVACAINE HCL (PF) 0.25 % IJ SOLN
INTRAMUSCULAR | Status: DC | PRN
  Administered 2013-04-10: 20 mL

## 2013-04-10 MED ORDER — LORATADINE 10 MG PO TABS
10.0000 mg | ORAL_TABLET | Freq: Every day | ORAL | Status: DC
  Administered 2013-04-10: 10 mg via ORAL
  Filled 2013-04-10 (×2): qty 1

## 2013-04-10 MED ORDER — ACETAMINOPHEN 325 MG PO TABS: 650.0000 mg | ORAL_TABLET | ORAL | Status: DC | PRN

## 2013-04-10 MED ORDER — SODIUM CHLORIDE 0.9 % IV SOLN
INTRAVENOUS | Status: AC
  Filled 2013-04-10: qty 500

## 2013-04-10 MED ORDER — BACITRACIN 50000 UNITS IM SOLR
INTRAMUSCULAR | Status: AC
  Filled 2013-04-10: qty 1

## 2013-04-10 MED ORDER — ALPRAZOLAM 0.25 MG PO TABS: 0.2500 mg | ORAL_TABLET | Freq: Three times a day (TID) | ORAL | Status: DC | PRN

## 2013-04-10 MED ORDER — FENTANYL CITRATE 0.05 MG/ML IJ SOLN
INTRAMUSCULAR | Status: DC | PRN
  Administered 2013-04-10: 50 ug via INTRAVENOUS
  Administered 2013-04-10: 100 ug via INTRAVENOUS

## 2013-04-10 MED ORDER — CEFAZOLIN SODIUM 1-5 GM-% IV SOLN
1.0000 g | Freq: Three times a day (TID) | INTRAVENOUS | Status: AC
  Administered 2013-04-10 – 2013-04-11 (×2): 1 g via INTRAVENOUS
  Filled 2013-04-10 (×3): qty 50

## 2013-04-10 MED ORDER — CEFAZOLIN SODIUM-DEXTROSE 2-3 GM-% IV SOLR: INTRAVENOUS | Status: DC | PRN

## 2013-04-10 MED ORDER — CYCLOBENZAPRINE HCL 10 MG PO TABS
10.0000 mg | ORAL_TABLET | Freq: Three times a day (TID) | ORAL | Status: DC | PRN
  Administered 2013-04-10 (×2): 10 mg via ORAL
  Filled 2013-04-10: qty 1

## 2013-04-10 MED ORDER — FLEET ENEMA 7-19 GM/118ML RE ENEM
1.0000 | ENEMA | Freq: Once | RECTAL | Status: AC | PRN
  Filled 2013-04-10: qty 1

## 2013-04-10 MED ORDER — HYDROMORPHONE HCL 2 MG PO TABS
2.0000 mg | ORAL_TABLET | ORAL | Status: DC | PRN
  Administered 2013-04-10 – 2013-04-11 (×2): 2 mg via ORAL
  Filled 2013-04-10 (×2): qty 1

## 2013-04-10 MED ORDER — GLYCOPYRROLATE 0.2 MG/ML IJ SOLN
INTRAMUSCULAR | Status: DC | PRN
  Administered 2013-04-10: .8 mg via INTRAVENOUS

## 2013-04-10 MED ORDER — SODIUM CHLORIDE 0.9 % IJ SOLN: 3.0000 mL | INTRAMUSCULAR | Status: DC | PRN

## 2013-04-10 MED ORDER — ALUM & MAG HYDROXIDE-SIMETH 200-200-20 MG/5ML PO SUSP: 30.0000 mL | Freq: Four times a day (QID) | ORAL | Status: DC | PRN

## 2013-04-10 MED ORDER — 0.9 % SODIUM CHLORIDE (POUR BTL) OPTIME
TOPICAL | Status: DC | PRN
  Administered 2013-04-10: 1000 mL

## 2013-04-10 MED ORDER — FLUTICASONE PROPIONATE 50 MCG/ACT NA SUSP
1.0000 | Freq: Every day | NASAL | Status: DC
  Administered 2013-04-10: 1 via NASAL
  Filled 2013-04-10: qty 16

## 2013-04-10 MED ORDER — DEXAMETHASONE SODIUM PHOSPHATE 10 MG/ML IJ SOLN: INTRAMUSCULAR | Status: DC | PRN

## 2013-04-10 MED ORDER — ARTIFICIAL TEARS OP OINT
TOPICAL_OINTMENT | OPHTHALMIC | Status: DC | PRN
  Administered 2013-04-10: 1 via OPHTHALMIC

## 2013-04-10 MED ORDER — LOSARTAN POTASSIUM 50 MG PO TABS
50.0000 mg | ORAL_TABLET | Freq: Every day | ORAL | Status: DC
  Administered 2013-04-10: 50 mg via ORAL
  Filled 2013-04-10 (×2): qty 1

## 2013-04-10 MED ORDER — THROMBIN 5000 UNITS EX SOLR
OROMUCOSAL | Status: DC | PRN
  Administered 2013-04-10: 13:00:00 via TOPICAL

## 2013-04-10 MED ORDER — PROPOFOL 10 MG/ML IV BOLUS
INTRAVENOUS | Status: DC | PRN
  Administered 2013-04-10: 170 mg via INTRAVENOUS

## 2013-04-10 MED ORDER — FUROSEMIDE 20 MG PO TABS
20.0000 mg | ORAL_TABLET | Freq: Every day | ORAL | Status: DC
  Filled 2013-04-10 (×2): qty 1

## 2013-04-10 MED ORDER — FAMOTIDINE 20 MG PO TABS
20.0000 mg | ORAL_TABLET | Freq: Two times a day (BID) | ORAL | Status: DC
  Administered 2013-04-10: 20 mg via ORAL
  Filled 2013-04-10 (×3): qty 1

## 2013-04-10 MED ORDER — PROMETHAZINE HCL 25 MG/ML IJ SOLN: 6.2500 mg | INTRAMUSCULAR | Status: DC | PRN

## 2013-04-10 MED ORDER — LIDOCAINE HCL (CARDIAC) 20 MG/ML IV SOLN
INTRAVENOUS | Status: DC | PRN
  Administered 2013-04-10: 100 mg via INTRAVENOUS

## 2013-04-10 SURGICAL SUPPLY — 63 items
ADH SKN CLS APL DERMABOND .7 (GAUZE/BANDAGES/DRESSINGS) ×1
APL SKNCLS STERI-STRIP NONHPOA (GAUZE/BANDAGES/DRESSINGS) ×1
BAG DECANTER FOR FLEXI CONT (MISCELLANEOUS) ×2 IMPLANT
BENZOIN TINCTURE PRP APPL 2/3 (GAUZE/BANDAGES/DRESSINGS) ×2 IMPLANT
BLADE SURG ROTATE 9660 (MISCELLANEOUS) IMPLANT
BRUSH SCRUB EZ PLAIN DRY (MISCELLANEOUS) ×2 IMPLANT
BUR MATCHSTICK NEURO 3.0 LAGG (BURR) ×2 IMPLANT
CAGE CAPSTONE BULLET 8X22 (Cage) ×1 IMPLANT
CANISTER SUCTION 2500CC (MISCELLANEOUS) ×2 IMPLANT
CAP LCK SPNE (Orthopedic Implant) ×6 IMPLANT
CAP LOCK SPINE RADIUS (Orthopedic Implant) IMPLANT
CAP LOCKING (Orthopedic Implant) ×12 IMPLANT
CLOTH BEACON ORANGE TIMEOUT ST (SAFETY) ×2 IMPLANT
CONT SPEC 4OZ CLIKSEAL STRL BL (MISCELLANEOUS) ×4 IMPLANT
COVER BACK TABLE 24X17X13 BIG (DRAPES) IMPLANT
COVER TABLE BACK 60X90 (DRAPES) ×2 IMPLANT
CROSSLINK MEDIUM (Orthopedic Implant) ×1 IMPLANT
DECANTER SPIKE VIAL GLASS SM (MISCELLANEOUS) ×2 IMPLANT
DERMABOND ADVANCED (GAUZE/BANDAGES/DRESSINGS) ×1
DERMABOND ADVANCED .7 DNX12 (GAUZE/BANDAGES/DRESSINGS) ×1 IMPLANT
DRAPE C-ARM 42X72 X-RAY (DRAPES) ×4 IMPLANT
DRAPE LAPAROTOMY 100X72X124 (DRAPES) ×2 IMPLANT
DRAPE POUCH INSTRU U-SHP 10X18 (DRAPES) ×2 IMPLANT
DRAPE PROXIMA HALF (DRAPES) ×1 IMPLANT
DRAPE SURG 17X23 STRL (DRAPES) ×8 IMPLANT
DURAPREP 26ML APPLICATOR (WOUND CARE) ×2 IMPLANT
ELECT REM PT RETURN 9FT ADLT (ELECTROSURGICAL) ×2
ELECTRODE REM PT RTRN 9FT ADLT (ELECTROSURGICAL) ×1 IMPLANT
EVACUATOR 1/8 PVC DRAIN (DRAIN) ×2 IMPLANT
GAUZE SPONGE 4X4 16PLY XRAY LF (GAUZE/BANDAGES/DRESSINGS) IMPLANT
GLOVE BIOGEL M 8.0 STRL (GLOVE) ×1 IMPLANT
GLOVE ECLIPSE 8.5 STRL (GLOVE) ×4 IMPLANT
GLOVE EXAM NITRILE LRG STRL (GLOVE) ×1 IMPLANT
GLOVE EXAM NITRILE MD LF STRL (GLOVE) IMPLANT
GLOVE EXAM NITRILE XL STR (GLOVE) IMPLANT
GLOVE EXAM NITRILE XS STR PU (GLOVE) IMPLANT
GLOVE INDICATOR 7.0 STRL GRN (GLOVE) ×2 IMPLANT
GLOVE SS BIOGEL STRL SZ 6.5 (GLOVE) IMPLANT
GLOVE SUPERSENSE BIOGEL SZ 6.5 (GLOVE) ×2
GOWN BRE IMP SLV AUR LG STRL (GOWN DISPOSABLE) ×2 IMPLANT
GOWN BRE IMP SLV AUR XL STRL (GOWN DISPOSABLE) ×3 IMPLANT
GOWN STRL REIN 2XL LVL4 (GOWN DISPOSABLE) IMPLANT
HEMOSTAT POWDER KIT SURGIFOAM (HEMOSTASIS) ×1 IMPLANT
KIT BASIN OR (CUSTOM PROCEDURE TRAY) ×2 IMPLANT
KIT ROOM TURNOVER OR (KITS) ×2 IMPLANT
NEEDLE HYPO 22GX1.5 SAFETY (NEEDLE) ×2 IMPLANT
NS IRRIG 1000ML POUR BTL (IV SOLUTION) ×2 IMPLANT
PACK LAMINECTOMY NEURO (CUSTOM PROCEDURE TRAY) ×2 IMPLANT
ROD 5.5X60MM GREEN (Rod) ×2 IMPLANT
SCREW 5.75X45MM (Screw) ×2 IMPLANT
SPONGE GAUZE 4X4 12PLY (GAUZE/BANDAGES/DRESSINGS) ×2 IMPLANT
SPONGE SURGIFOAM ABS GEL 100 (HEMOSTASIS) ×2 IMPLANT
STRIP CLOSURE SKIN 1/2X4 (GAUZE/BANDAGES/DRESSINGS) ×3 IMPLANT
SUT VIC AB 0 CT1 18XCR BRD8 (SUTURE) ×2 IMPLANT
SUT VIC AB 0 CT1 8-18 (SUTURE) ×2
SUT VIC AB 2-0 CT1 18 (SUTURE) ×2 IMPLANT
SUT VIC AB 3-0 SH 8-18 (SUTURE) ×3 IMPLANT
SYR 20ML ECCENTRIC (SYRINGE) ×2 IMPLANT
TAPE CLOTH SURG 4X10 WHT LF (GAUZE/BANDAGES/DRESSINGS) ×1 IMPLANT
TOWEL OR 17X24 6PK STRL BLUE (TOWEL DISPOSABLE) ×2 IMPLANT
TOWEL OR 17X26 10 PK STRL BLUE (TOWEL DISPOSABLE) ×2 IMPLANT
TRAY FOLEY CATH 14FRSI W/METER (CATHETERS) ×2 IMPLANT
WATER STERILE IRR 1000ML POUR (IV SOLUTION) ×2 IMPLANT

## 2013-04-10 NOTE — Brief Op Note (Signed)
04/10/2013  2:02 PM  PATIENT:  Emily [name]  62 y.o. female  PRE-OPERATIVE DIAGNOSIS:  Herniated Nucleus Pulposus  POST-OPERATIVE DIAGNOSIS:  Herniated Nucleus Pulposus  PROCEDURE:  Procedure(s) with comments: Lumbar three-four posterior lumbar interbody fusion decompression lumbar laminectomy, tangent, telamun cage, posterior lateral arthrodesis with pedicle screws. (N/A) - Lumbar three-four posterior lumbar interbody fusion decompression lumbar laminectomy, tangent, telamun cage, posterior lateral arthrodesis with pedicle screws.  SURGEON:  Surgeon(s) and Role:    * Temple Pacini, MD - Primary    * Karn Cassis, MD - Assisting  PHYSICIAN ASSISTANT:   ASSISTANTS:    ANESTHESIA:   general  EBL:  Total I/O In: 1000 [I.V.:1000] Out: 550 [Urine:300; Blood:250]  BLOOD ADMINISTERED:none  DRAINS: (Medium) Hemovact drain(s) in the Epidural space with  Suction Open   LOCAL MEDICATIONS USED:  MARCAINE     SPECIMEN:  No Specimen  DISPOSITION OF SPECIMEN:  N/A  COUNTS:  YES  TOURNIQUET:  * No tourniquets in log *  DICTATION: .Dragon Dictation  PLAN OF CARE: Admit to inpatient   PATIENT DISPOSITION:  PACU - hemodynamically stable.   Delay start of Pharmacological VTE agent (>24hrs) due to surgical blood loss or risk of bleeding: yes

## 2013-04-10 NOTE — Transfer of Care (Signed)
Immediate Anesthesia Transfer of Care Note  Patient: Emily [name]  Procedure(s) Performed: Procedure(s) with comments: Lumbar three-four posterior lumbar interbody fusion decompression lumbar laminectomy, tangent, telamun cage, posterior lateral arthrodesis with pedicle screws. (N/A) - Lumbar three-four posterior lumbar interbody fusion decompression lumbar laminectomy, tangent, telamun cage, posterior lateral arthrodesis with pedicle screws.  Patient Location: PACU  Anesthesia Type:General  Level of Consciousness: awake, alert  and oriented  Airway & Oxygen Therapy: Patient Spontanous Breathing and Patient connected to nasal cannula oxygen  Post-op Assessment: Report given to PACU RN and Post -op Vital signs reviewed and stable  Post vital signs: Reviewed and stable  Complications: No apparent anesthesia complications

## 2013-04-10 NOTE — Op Note (Signed)
Date of procedure: 04/10/2013  Date of dictation: Same  Service: Neurosurgery  Preoperative diagnosis: L3-4 herniated nucleus pulposus with stenosis and neurogenic claudication, status post L4-5 decompression and fusion with instrumentation.  Postoperative diagnosis: Same  Procedure Name: Reexploration of L3-4 laminectomy with bilateral complete L3 for decompressive laminectomy and bilateral L3 and L4 decompressive foraminotomies including complete inferior facetectomies of L3 and superior facetectomies of L4; much more than would be required for simple her body fusion alone.  L3-L4 posterior lumbar interbody fusion utilizing tangent interbody allograft wedge Telamon interbody peek cage and local autograft.  L3-L5 posterior lateral arthrodesis utilizing segmental pedicle screw instrumentation and local autograft.   Surgeon:Kamla Skilton A.Kierstyn Baranowski, M.D.  Asst. Surgeon: Jeral Fruit   Anesthesia: General  Indication: 62 year old female status post previous L4-5 decompression and fusion with good results. Patient presents now with worsening back and bilateral r symptoms failing conservative management her workup demonstrates evidence of progressive disc space breakdown at L3-4 with an acute subligamentous disc herniation off to the left side and severe neuroforaminal stenosis bilaterally. Patient patient is decided proceed with L3 for decompression infusion in hopes of improving her symptoms.  Operative note: After induction anesthesia, patient positioned prone onto Wilson frame and appropriately padded. Lumbar region prepped and draped. Subperiosteal dissection performed exposing the lamina facet joints and transverse processes of L3-L4 and L5 as well as the previously placed pedicle screw station L4-5. Pedicle screw sedation L4-5 was disassembled and removed. Screws were left in place. Decompressive laminectomies then performed at L3-4 using Leksell rongeurs Kerrison or his high-speed drill to remove the  entire lamina of L3 inferior facets of L3 bilaterally superior facet of L4 bilaterally ligamentum flavum and epidural scar. Wide decompressive foraminotomies were performed on course the exiting L3 and L4 nerve roots. Bilateral discectomies were performed at L3-4 including all elements of the disc herniation off the left-sided L3-4. The spaces distracted 8 mm and an 8 mm distractor was left in the patient's left side. Thecal sac and nerve respect on the right side. The space was reamed and then cut with 8 mm tangent instruments. Soft tissue was removed and interspace. 8 x 20 60 m tangent allograft wedges and packed into place recessed roughly 1 mm from the posterior cortical margin of L3. Distractors were was removed and patient's left side. Thecal sac and nerve respect on the left side. The space was again reamed and cut with a millimeters tangent introducer soft tissue was removed and interspace. The space was further curettaged. Morselized autograft obtained from the previous laminectomy was then packed in the interspace for later fusion. An 8 x 22 mm Telamon cage packed with morselized autograft was also impacted in place recessed roughly 1-2 mm from the posterior cortical margin of L3. Pedicles of L3 were notified using surface landmarks and intraoperative fluoroscopy. Superficial bone overlying the pedicle was removed using a high-speed drill each pedicle was then probed using a pedicle awl each pedicle awl track was then tapped with a 5.5 mm screw tap. Each pedicle was probed and found to be solidly within bone. 5.75 x 45 mm radius screws were placed bilaterally at L3. Transverse processes of L3 and L4 were decorticated high-speed drill. Morselized autograft packed from L3 and L5. Previous fusion L4-5 appeared solid. Short segment titanium rods and posterior the screw heads at L3-L4 and L5. Locking caps and placed over the screws were locking catching and engaged with the construct under compression. Final  images revealed good position the bone graft and hardware at  the proper upper level with normal lamina spine. A transverse connector was placed. Medium Hemovac drain was left at per space. Wounds and close in layers with Vicryl sutures. Steri-Strips and sterile dressing were applied. There were no apparent complications. Patient tolerated the procedure well and she returns to the recovery room postop

## 2013-04-10 NOTE — Preoperative (Signed)
Beta Blockers   Reason not to administer Beta Blockers:Not Applicable 

## 2013-04-10 NOTE — Anesthesia Postprocedure Evaluation (Signed)
  Anesthesia Post-op Note  Patient: AMADA HALLISEY  Procedure(s) Performed: Procedure(s) with comments: Lumbar three-four posterior lumbar interbody fusion decompression lumbar laminectomy, tangent, telamun cage, posterior lateral arthrodesis with pedicle screws. (N/A) - Lumbar three-four posterior lumbar interbody fusion decompression lumbar laminectomy, tangent, telamun cage, posterior lateral arthrodesis with pedicle screws.  Patient Location: PACU  Anesthesia Type:General  Level of Consciousness: awake  Airway and Oxygen Therapy: Patient Spontanous Breathing  Post-op Pain: mild  Post-op Assessment: Post-op Vital signs reviewed  Post-op Vital Signs: stable  Complications: No apparent anesthesia complications

## 2013-04-10 NOTE — Anesthesia Preprocedure Evaluation (Signed)
Anesthesia Evaluation  Patient identified by MRN, date of birth, ID band Patient awake    Reviewed: Allergy & Precautions, H&P , NPO status   Airway Mallampati: II  Neck ROM: Full    Dental  (+) Edentulous Upper   Pulmonary sleep apnea ,  breath sounds clear to auscultation        Cardiovascular hypertension, +CHF Rhythm:Regular Rate:Normal     Neuro/Psych Anxiety Depression  Neuromuscular disease    GI/Hepatic GERD-  ,  Endo/Other    Renal/GU      Musculoskeletal   Abdominal   Peds  Hematology   Anesthesia Other Findings   Reproductive/Obstetrics                           Anesthesia Physical Anesthesia Plan  ASA: II  Anesthesia Plan: General   Post-op Pain Management:    Induction: Intravenous  Airway Management Planned: Oral ETT  Additional Equipment:   Intra-op Plan:   Post-operative Plan: Extubation in OR  Informed Consent: I have reviewed the patients History and Physical, chart, labs and discussed the procedure including the risks, benefits and alternatives for the proposed anesthesia with the patient or authorized representative who has indicated his/her understanding and acceptance.   Dental advisory given  Plan Discussed with: CRNA and Surgeon  Anesthesia Plan Comments:         Anesthesia Quick Evaluation

## 2013-04-10 NOTE — H&P (Signed)
Emily [name] is an 62 y.o. female.   Chief Complaint: Back and leg pain HPI: 62 year old female status post previous L4-5 decompression and fusion with good result presents now with worsening back and bilateral lower extremity pain left greater than right. Workup demonstrates evidence of progressive breakdown of the L3-4 disc space with superimposed disc herniation and moderately severe spinal stenosis at L3-4. The remainder of her lumbar spine shows some early degenerative change but no evidence of marked stenosis or nerve root compression. Patient's failed conservative management and presents now for L3 for decompression infusion in hopes of improving her symptoms.  Past Medical History  Diagnosis Date  . CHF (congestive heart failure)   . Acid reflux   . Nerve pain     right leg   . Joint pain   . Depression   . Ruptured lumbar disc   . Complication of anesthesia     History of being told she was allergic to something in 1980 no problems since  . Hypertension     Does not see a cardiologist no stress or echo  . Anxiety   . Sleep apnea     Uses CPAP every night setting at 7 mm/Hg  . HNP (herniated nucleus pulposus)   . History of blood transfusion reaction 1978    Past Surgical History  Procedure Laterality Date  . Cesarean section    . Bladder repair    . Gallbladder surgery    . Wrist ganglion cyst left    . Knee right (torn cart)    . Tubal ligation    . Vein right leg removed    . Cholecystectomy    . Replacement total knee      left  . Rotator cuff repair      right  . Back surgery      Family History  Problem Relation Age of Onset  . Cancer      family history   . Diabetes      family history   . Coronary artery disease      family history   . Arthritis      family history   . Asthma     Social History:  reports that she has never smoked. She does not have any smokeless tobacco history on file. She reports that she does not drink alcohol or use illicit  drugs.  Allergies:  Allergies  Allergen Reactions  . Codeine Itching  . Oxycodone-Acetaminophen Itching  . Sulfamethoxazole W-Trimethoprim Hives  . Celebrex (Celecoxib)   . Hydrocodone-Acetaminophen Itching    Takes with benadryl  . Lyrica (Pregabalin)     When combined with narcotics will cause SpO2 to drop    Medications Prior to Admission  Medication Sig Dispense Refill  . acetaminophen (TYLENOL) 650 MG CR tablet Take 650 mg by mouth every 8 (eight) hours as needed for pain.      Marland Kitchen ALPRAZolam (XANAX) 0.5 MG tablet Take 0.25 mg by mouth 3 (three) times daily as needed for sleep or anxiety.      . citalopram (CELEXA) 20 MG tablet Take 20 mg by mouth daily.       Marland Kitchen Fexofenadine HCl (ALLEGRA PO) Take 1 tablet by mouth daily as needed (allergies).       . fluticasone (FLONASE) 50 MCG/ACT nasal spray Place 1 spray into the nose daily.       . furosemide (LASIX) 20 MG tablet Take 20-40 mg by mouth See admin instructions. Take one  tablet in the morning--may take one additional tablet in the evening as needed for swelling      . gemfibrozil (LOPID) 600 MG tablet Take 600 mg by mouth 2 (two) times daily before a meal.      . HYDROcodone-acetaminophen (NORCO) 5-325 MG per tablet Take 1 tablet by mouth every 4 (four) hours as needed for pain.  60 tablet  1  . KLOR-CON M20 20 MEQ tablet Take 20 mEq by mouth daily.       Marland Kitchen losartan (COZAAR) 50 MG tablet Take 50 mg by mouth daily.      . ranitidine (ZANTAC) 150 MG capsule Take 150 mg by mouth 2 (two) times daily.       . verapamil (CALAN-SR) 180 MG CR tablet Take 180 mg by mouth every morning.         No results found for this or any previous visit (from the past 48 hour(s)). No results found.  Review of Systems  Constitutional: Negative.   HENT: Negative.   Eyes: Negative.   Respiratory: Negative.   Cardiovascular: Negative.   Gastrointestinal: Negative.   Genitourinary: Negative.   Musculoskeletal: Negative.   Skin: Negative.    Neurological: Negative.   Endo/Heme/Allergies: Negative.   Psychiatric/Behavioral: Negative.     Blood pressure 139/67, pulse 58, temperature 98.4 F (36.9 C), temperature source Oral, resp. rate 20, SpO2 98.00%. Physical Exam  Constitutional: She is oriented to person, place, and time. She appears well-developed and well-nourished. No distress.  HENT:  Head: Normocephalic and atraumatic.  Right Ear: External ear normal.  Left Ear: External ear normal.  Nose: Nose normal.  Mouth/Throat: Oropharynx is clear and moist. No oropharyngeal exudate.  Eyes: Conjunctivae and EOM are normal. Pupils are equal, round, and reactive to light. Right eye exhibits no discharge. Left eye exhibits no discharge.  Neck: Normal range of motion. Neck supple. No tracheal deviation present. No thyromegaly present.  Cardiovascular: Normal rate, regular rhythm, normal heart sounds and intact distal pulses.  Exam reveals no friction rub.   No murmur heard. Respiratory: Effort normal and breath sounds normal. No respiratory distress. She has no wheezes.  GI: Soft. Bowel sounds are normal. She exhibits no distension. There is no tenderness.  Musculoskeletal: Normal range of motion. She exhibits no edema and no tenderness.  Neurological: She is alert and oriented to person, place, and time. She displays normal reflexes. No cranial nerve deficit. She exhibits normal muscle tone. Coordination normal.  Skin: Skin is warm and dry. No rash noted. She is not diaphoretic. No erythema. No pallor.  Psychiatric: She has a normal mood and affect. Her behavior is normal. Judgment and thought content normal.     Assessment/Plan L3-4 spondylosis and stenosis with neurogenic claudication status post L4-5 decompression and fusion with instrumentation. Plan L3-4 decompressive laminectomy and foraminotomies followed by posterior lumbar interbody fusion utilizing tangent interbody allograft wedge Telamon interbody peek cage and local  autograft. This be coupled with posterior lateral arthrodesis utilizing segmental pedicle screw station from L3-L5. Risks and benefits have been explained. Patient wishes to proceed.  Garett Tetzloff A 04/10/2013, 11:33 AM

## 2013-04-11 ENCOUNTER — Encounter: Payer: Self-pay | Admitting: *Deleted

## 2013-04-11 MED ORDER — HYDROMORPHONE HCL 2 MG PO TABS: 2.0000 mg | ORAL_TABLET | ORAL | Status: DC | PRN

## 2013-04-11 MED ORDER — DIAZEPAM 5 MG PO TABS: 5.0000 mg | ORAL_TABLET | Freq: Four times a day (QID) | ORAL | Status: DC | PRN

## 2013-04-11 MED FILL — Heparin Sodium (Porcine) Inj 1000 Unit/ML: INTRAMUSCULAR | Qty: 30 | Status: AC

## 2013-04-11 MED FILL — Sodium Chloride Irrigation Soln 0.9%: Qty: 3000 | Status: AC

## 2013-04-11 MED FILL — Sodium Chloride IV Soln 0.9%: INTRAVENOUS | Qty: 1000 | Status: AC

## 2013-04-11 NOTE — Discharge Summary (Signed)
Physician Discharge Summary  Patient ID: Emily [name] MRN: [id number] DOB/AGE: Mar 21, 1951 62 y.o.  Admit date: 04/10/2013 Discharge date: 04/11/2013  Admission Diagnoses:  Discharge Diagnoses:  Principal Problem:   SPINAL STENOSIS, LUMBAR   Discharged Condition: good  Hospital Course: Patient in the hospital where she underwent uncomplicated L3 for decompression and fusion with this dictation. Postoperative she is doing very well. Preoperative back and lower extremity pain much improved. Strength station intact. Wound healing well. Patient ready for discharge home.  Consults:   Significant Diagnostic Studies:   Treatments:   Discharge Exam: Blood pressure 110/72, pulse 90, temperature 98.9 F (37.2 C), temperature source Oral, resp. rate 16, SpO2 94.00%. Awake and alert. Oriented and appropriate her cranial nerve function intact. Motor and sensory function extremities normal. Wound clean and dry. Chest abdomen benign.  Disposition: 01-Home or Self Care  Discharge Orders   Future Orders Complete By Expires     Discontinue JP/Blake drain  As directed         Medication List    TAKE these medications       acetaminophen 650 MG CR tablet  Commonly known as:  TYLENOL  Take 650 mg by mouth every 8 (eight) hours as needed for pain.     ALLEGRA PO  Take 1 tablet by mouth daily as needed (allergies).     ALPRAZolam 0.5 MG tablet  Commonly known as:  XANAX  Take 0.25 mg by mouth 3 (three) times daily as needed for sleep or anxiety.     citalopram 20 MG tablet  Commonly known as:  CELEXA  Take 20 mg by mouth daily.     diazepam 5 MG tablet  Commonly known as:  VALIUM  Take 1-2 tablets (5-10 mg total) by mouth every 6 (six) hours as needed (Muscle spasms).     fluticasone 50 MCG/ACT nasal spray  Commonly known as:  FLONASE  Place 1 spray into the nose daily.     furosemide 20 MG tablet  Commonly known as:  LASIX  Take 20-40 mg by mouth See admin instructions. Take  one tablet in the morning--may take one additional tablet in the evening as needed for swelling     gemfibrozil 600 MG tablet  Commonly known as:  LOPID  Take 600 mg by mouth 2 (two) times daily before [name] meal.     HYDROcodone-acetaminophen 5-325 MG per tablet  Commonly known as:  NORCO/VICODIN  Take 1 tablet by mouth every 4 (four) hours as needed for pain.     HYDROmorphone 2 MG tablet  Commonly known as:  DILAUDID  Take 1-2 tablets (2-4 mg total) by mouth every 4 (four) hours as needed.     KLOR-CON M20 20 MEQ tablet  Generic drug:  potassium chloride SA  Take 20 mEq by mouth daily.     losartan 50 MG tablet  Commonly known as:  COZAAR  Take 50 mg by mouth daily.     verapamil 180 MG CR tablet  Commonly known as:  CALAN-SR  Take 180 mg by mouth every morning.     ZANTAC 150 MG capsule  Generic drug:  ranitidine  Take 150 mg by mouth 2 (two) times daily.           Follow-up Information   Follow up with Emily Schermerhorn A, MD. Call in 1 week. (Ask for Emily [name])    Contact information:   [address] [place] [address] [phone]  Signed: [name] 04/11/2013, 8:33 AM

## 2013-04-11 NOTE — Progress Notes (Signed)
Pt given D/C instructions with Rx's, verbal understanding given. Pt D/C'd home via wheelchair per MD order. Mackenzie Lia, RN 

## 2013-04-11 NOTE — Progress Notes (Signed)
UR COMPLETED  

## 2013-04-11 NOTE — Progress Notes (Signed)
PT Cancellation Note  Patient Details Name: Emily [name] MRN: [id number] DOB: [birth year]   Cancelled Treatment:    Reason Eval/Treat Not Completed: Other (comment) (No PT needs.  OT evaluated and educated pt.)   Ayisha Pol 04/11/2013, 9:17 AM

## 2013-04-11 NOTE — Progress Notes (Signed)
Occupational Therapy Discharge Patient Details Name: Emily [name] MRN: [id number] DOB: [birth year] Today's Date: 04/11/2013 Time: 1478-2956 OT Time Calculation (min): 16 min  Patient discharged from OT services secondary to goals met and no further OT needs identified.  Please see latest therapy progress note for current level of functioning and progress toward goals.    Progress and discharge plan discussed with patient and/or caregiver: Patient/Caregiver agrees with plan  GO   OT communicating with PT [facility] regarding no acute PT needs at this time.   Harrel Carina [place] Pager: [phone]  04/11/2013, 9:10 AM

## 2013-04-11 NOTE — Evaluation (Signed)
Occupational Therapy Evaluation Patient Details Name: Emily [name] MRN: [id number] DOB: 1951/01/19 Today's Date: 04/11/2013 Time: 0850-0906 OT Time Calculation (min): 16 min  OT Assessment / Plan / Recommendation Clinical Impression  62 yo female s/p L3-4 laminectomy that does not require skilled OT acutely. NO OT needs Sign off    OT Assessment  Patient does not need any further OT services    Follow Up Recommendations  No OT follow up    Barriers to Discharge      Equipment Recommendations  None recommended by OT    Recommendations for Other Services    Frequency       Precautions / Restrictions Precautions Precautions: Back Precaution Comments: provided back handout   Pertinent Vitals/Pain 2-3 out 10     ADL  Eating/Feeding: Independent Where Assessed - Eating/Feeding: Chair Upper Body Dressing: Independent Where Assessed - Upper Body Dressing: Unsupported sitting Lower Body Dressing: Independent Where Assessed - Lower Body Dressing: Unsupported sitting Toilet Transfer: Modified independent Toilet Transfer Method: Sit to stand Toilet Transfer Equipment: Regular height toilet Tub/Shower Transfer: Modified independent Tub/Shower Transfer Method: Science writer: Walk in shower Equipment Used: Back brace Transfers/Ambulation Related to ADLs: Pt ambulating MOD I and completed 4 stairs alternating steps ADL Comments: Pt educated on adls with back precautions and 100% recall of precautions. 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" Patient Stated Goal: to return back home   Prior Functioning     Home Living Type of Home: House Home Access: Stairs to enter [employer] of Steps: 4 Entrance Stairs-Rails: Left Home Layout:  One level Bathroom Shower/Tub: Walk-in shower;Door [employer] Toilet: Standard Home Adaptive Equipment: Sock aid;Reacher;Built-in shower seat Prior Function Level of Independence: Independent Able to Take Stairs?: Yes Driving: Yes Communication Communication: No difficulties Dominant Hand: Right         Vision/Perception Vision - History Baseline Vision: Wears glasses all the time   Cognition  Cognition Arousal/Alertness: Awake/alert Behavior During Therapy: WFL for tasks assessed/performed Overall Cognitive Status: Within Functional Limits for tasks assessed    Extremity/Trunk Assessment Right Upper Extremity Assessment RUE ROM/Strength/Tone: Due to precautions;WFL for tasks assessed Left Upper Extremity Assessment LUE ROM/Strength/Tone: WFL for tasks assessed;Due to precautions     Mobility Bed Mobility Bed Mobility: Supine to Sit;Sitting - Scoot to Edge of Bed Supine to Sit: 6: Modified independent (Device/Increase time);HOB flat Sitting - Scoot to Edge of Bed: 6: Modified independent (Device/Increase time) Transfers Transfers: Sit to Stand;Stand to Sit Sit to Stand: 6: Modified independent (Device/Increase time);With upper extremity assist;From bed Stand to Sit: 6: Modified independent (Device/Increase time);With upper extremity assist;To chair/3-in-1     Exercise     Balance High Level Balance High Level Balance Activites: Turns;Head turns;Direction changes   End of Session OT - End of Session Activity Tolerance: Patient tolerated treatment well Patient left: in chair;with call bell/phone within reach Nurse Communication: Mobility status;Precautions  GO     Lucile Shutters 04/11/2013, 9:10 AM Pager: [phone]

## 2013-04-25 ENCOUNTER — Other Ambulatory Visit (HOSPITAL_COMMUNITY): Payer: Self-pay | Admitting: Family Medicine

## 2013-06-01 ENCOUNTER — Other Ambulatory Visit: Payer: Self-pay | Admitting: *Deleted

## 2013-06-01 MED ORDER — VERAPAMIL HCL ER 180 MG PO TBCR: 180.0000 mg | EXTENDED_RELEASE_TABLET | Freq: Every morning | ORAL | Status: DC

## 2013-06-11 ENCOUNTER — Encounter: Payer: Self-pay | Admitting: Family Medicine

## 2013-06-11 ENCOUNTER — Ambulatory Visit (INDEPENDENT_AMBULATORY_CARE_PROVIDER_SITE_OTHER): Payer: Self-pay | Admitting: Family Medicine

## 2013-06-11 VITALS — BP 132/86 | Temp 98.3°F | Ht 61.5 in | Wt 180.5 lb

## 2013-06-11 DIAGNOSIS — N390 Urinary tract infection, site not specified: Secondary | ICD-10-CM

## 2013-06-11 LAB — POCT URINALYSIS DIPSTICK

## 2013-06-11 MED ORDER — CIPROFLOXACIN HCL 250 MG PO TABS: 250.0000 mg | ORAL_TABLET | Freq: Two times a day (BID) | ORAL | Status: AC

## 2013-06-11 NOTE — Progress Notes (Signed)
  Subjective:    Patient ID: Emily [name], female    DOB: 10-17-1951, 62 y.o.   MRN: [id number]  Urinary Tract Infection  This is a new problem. The current episode started in the past 7 days. The problem occurs every urination. The problem has been gradually worsening. The quality of the pain is described as burning. The pain is at a severity of 4/10. The pain is moderate. There has been no fever. Associated symptoms include frequency and hesitancy. Pertinent negatives include no chills, flank pain or hematuria. She has tried increased fluids for the symptoms. The treatment provided mild relief. There is no history of recurrent UTIs or urinary stasis.      Review of Systems  Constitutional: Negative for chills.  Genitourinary: Positive for hesitancy and frequency. Negative for hematuria and flank pain.       Objective:   Physical Exam Alert no acute distress. Lungs clear. Heart regular rate and rhythm. HEENT normal. No CVA tenderness.  UA 6-8 whites per high-powered field.     Assessment & Plan:  Impression UTI discussed. Plan Cipro 250 twice a day 5 days. Symptomatic care discussed. WSL

## 2013-06-11 NOTE — Patient Instructions (Signed)
Take all the antibiotics 

## 2013-07-05 ENCOUNTER — Other Ambulatory Visit: Payer: Self-pay | Admitting: Family Medicine

## 2013-07-06 ENCOUNTER — Other Ambulatory Visit: Payer: Self-pay | Admitting: *Deleted

## 2013-07-06 MED ORDER — POTASSIUM CHLORIDE CRYS ER 20 MEQ PO TBCR: 20.0000 meq | EXTENDED_RELEASE_TABLET | Freq: Every day | ORAL | Status: DC

## 2013-07-12 ENCOUNTER — Ambulatory Visit (INDEPENDENT_AMBULATORY_CARE_PROVIDER_SITE_OTHER): Payer: Self-pay | Admitting: Nurse Practitioner

## 2013-07-12 ENCOUNTER — Encounter: Payer: Self-pay | Admitting: Nurse Practitioner

## 2013-07-12 VITALS — BP 140/84 | HR 76 | Ht 62.0 in | Wt 179.0 lb

## 2013-07-12 DIAGNOSIS — B372 Candidiasis of skin and nail: Secondary | ICD-10-CM

## 2013-07-12 DIAGNOSIS — H8393 Unspecified disease of inner ear, bilateral: Secondary | ICD-10-CM

## 2013-07-12 DIAGNOSIS — H8190 Unspecified disorder of vestibular function, unspecified ear: Secondary | ICD-10-CM

## 2013-07-12 DIAGNOSIS — J309 Allergic rhinitis, unspecified: Secondary | ICD-10-CM

## 2013-07-12 DIAGNOSIS — J300 Vasomotor rhinitis: Secondary | ICD-10-CM

## 2013-07-12 MED ORDER — CLOTRIMAZOLE-BETAMETHASONE 1-0.05 % EX CREA: TOPICAL_CREAM | Freq: Two times a day (BID) | CUTANEOUS | Status: DC

## 2013-07-12 NOTE — Patient Instructions (Signed)
Meclizine OTC as directed; 25 mg every 6 hours Cetaphil cream

## 2013-07-16 ENCOUNTER — Encounter: Payer: Self-pay | Admitting: Nurse Practitioner

## 2013-07-16 NOTE — Progress Notes (Signed)
Subjective:  Presents complaints of a flareup of her vertigo for the past week. Has occurred off and on. Very mild rare right occipital area headache. No fever cough runny nose. No sore throat. No ear pain. No nausea vomiting. No syncopal issues. No numbness or weakness of the face arms or legs. No difficulty speaking or swallowing. Continues to have major issues with her back, continues followup with her specialist. Mentions at the end of the visit she's had some dry skin condition that is going on for a while, mildly pruritic. Only involves the soles of both feet. Mild improvement with moisturizing.  Objective:   BP 140/84  Pulse 76  Ht 5\' 2"  (1.575 m)  Wt 179 lb (81.194 kg)  BMI 32.73 kg/m2 NAD. Alert, oriented. TMs significant clear effusion, no erythema. Pharynx nonerythematous with clear PND noted. Neck supple with mild soft nontender adenopathy. Lungs clear. Heart regular rate rhythm. EOMs intact without nystagmus. Point-to-point localization normal limit. Muscle strength 5+ upper extremities. Reflexes normal limit. Romberg test deferred due to problems with gait due to her back issues. Mildly erythematous dry peeling skin noted on the sole of the right foot, clearly defined edges noted along the sides of the foot.  Assessment:Vasomotor rhinitis  Inner ear dysfunction, bilateral  Yeast dermatitis  possible  Plan: Meds ordered this encounter  Medications  . clotrimazole-betamethasone (LOTRISONE) cream    Sig: Apply topically 2 (two) times daily. Up to 2 weeks    Dispense:  45 g    Refill:  0    Order Specific Question:  Supervising Provider    Answer:  Merlyn Albert [[id number]]   restart Flonase and OTC antihistamines as directed. OTC meclizine as directed. Cetaphil cream to feet for moisturizing. Use Lotrisone up to 2 weeks, stopped if no improvement. Call back if any symptoms worsen or persist.

## 2013-08-08 ENCOUNTER — Other Ambulatory Visit: Payer: Self-pay | Admitting: *Deleted

## 2013-08-08 MED ORDER — LOSARTAN POTASSIUM 50 MG PO TABS: ORAL_TABLET | ORAL | Status: DC

## 2013-08-08 MED ORDER — VERAPAMIL HCL ER 180 MG PO TBCR: EXTENDED_RELEASE_TABLET | ORAL | Status: DC

## 2013-08-15 ENCOUNTER — Ambulatory Visit: Payer: Medicaid Other | Admitting: Orthopedic Surgery

## 2013-08-15 ENCOUNTER — Telehealth: Payer: Self-pay | Admitting: Family Medicine

## 2013-08-15 ENCOUNTER — Other Ambulatory Visit: Payer: Self-pay | Admitting: *Deleted

## 2013-08-15 DIAGNOSIS — E782 Mixed hyperlipidemia: Secondary | ICD-10-CM

## 2013-08-15 DIAGNOSIS — Z79899 Other long term (current) drug therapy: Secondary | ICD-10-CM

## 2013-08-15 NOTE — Telephone Encounter (Signed)
Patient would like to know if it is time for her to have blood work completed.  Please call as soon as possible to let her know.  Patient has an appointment on Friday August 24, 2013 and would like to have blood work completed before this time.

## 2013-08-16 NOTE — Telephone Encounter (Signed)
Paperwork for blood work give to patient's husband

## 2013-08-20 LAB — HEPATIC FUNCTION PANEL
AST: 16 U/L (ref 0–37)
Albumin: 4.5 g/dL (ref 3.5–5.2)
Alkaline Phosphatase: 62 U/L (ref 39–117)
Total Bilirubin: 0.5 mg/dL (ref 0.3–1.2)
Total Protein: 7.2 g/dL (ref 6.0–8.3)

## 2013-08-20 LAB — LIPID PANEL
HDL: 36 mg/dL — ABNORMAL LOW (ref >39)
LDL Cholesterol: 66 mg/dL (ref 0–99)
Total CHOL/HDL Ratio: 4.5 Ratio
Triglycerides: 296 mg/dL — ABNORMAL HIGH (ref <150)

## 2013-08-20 LAB — BASIC METABOLIC PANEL
Calcium: 9.5 mg/dL (ref 8.4–10.5)
Chloride: 108 mEq/L (ref 96–112)
Creat: 0.79 mg/dL (ref 0.50–1.10)
Sodium: 142 mEq/L (ref 135–145)

## 2013-08-23 ENCOUNTER — Other Ambulatory Visit: Payer: Self-pay | Admitting: Neurosurgery

## 2013-08-23 DIAGNOSIS — M4802 Spinal stenosis, cervical region: Secondary | ICD-10-CM

## 2013-08-24 ENCOUNTER — Ambulatory Visit (INDEPENDENT_AMBULATORY_CARE_PROVIDER_SITE_OTHER): Payer: Self-pay | Admitting: Family Medicine

## 2013-08-24 ENCOUNTER — Encounter: Payer: Self-pay | Admitting: Family Medicine

## 2013-08-24 VITALS — BP 142/78 | Temp 99.1°F | Ht 62.0 in | Wt 180.0 lb

## 2013-08-24 DIAGNOSIS — E781 Pure hyperglyceridemia: Secondary | ICD-10-CM | POA: Insufficient documentation

## 2013-08-24 DIAGNOSIS — G8929 Other chronic pain: Secondary | ICD-10-CM

## 2013-08-24 DIAGNOSIS — J019 Acute sinusitis, unspecified: Secondary | ICD-10-CM

## 2013-08-24 DIAGNOSIS — I10 Essential (primary) hypertension: Secondary | ICD-10-CM | POA: Insufficient documentation

## 2013-08-24 DIAGNOSIS — M542 Cervicalgia: Secondary | ICD-10-CM

## 2013-08-24 MED ORDER — AMOXICILLIN 500 MG PO TABS: 500.0000 mg | ORAL_TABLET | Freq: Three times a day (TID) | ORAL | Status: DC

## 2013-08-24 NOTE — Progress Notes (Signed)
  Subjective:    Patient ID: Emily [name], female    DOB: 04/23/1951, 62 y.o.   MRN: [id number]  Sore Throat  This is a new problem. The current episode started in the past 7 days. Associated symptoms include congestion, coughing, ear pain and headaches. Pertinent negatives include no abdominal pain or shortness of breath. Associated symptoms comments: dizziness.  some vertigo sx Watching diet, taking allegra and nasal spray  Eating healthy, taking BP meds, have stopped lasix Neck pain, getting MRI and seeing specialist   Review of Systems  Constitutional: Negative for fever and fatigue.  HENT: Positive for ear pain and congestion.   Respiratory: Positive for cough. Negative for choking and shortness of breath.   Cardiovascular: Negative for chest pain.  Gastrointestinal: Negative for abdominal pain.  Neurological: Positive for headaches.       Objective:   Physical Exam  Constitutional: She appears well-developed and well-nourished.  Cardiovascular: Normal rate, regular rhythm and normal heart sounds.   No murmur heard. Pulmonary/Chest: Effort normal and breath sounds normal. No respiratory distress.  Abdominal: Soft.  Lymphadenopathy:    She has no cervical adenopathy.          Assessment & Plan:  Hypertension good control currently continue current measures Chronic neck pain disabled see specialists. Recent URI possible acute sinusitis amoxicillin 10 days as directed followup 4-6 months Hyperlipidemia continue Lopid for triglycerides

## 2013-08-25 ENCOUNTER — Ambulatory Visit
Admission: RE | Admit: 2013-08-25 | Discharge: 2013-08-25 | Disposition: A | Discharge status: Home / Self Care | Payer: No Typology Code available for payment source | Source: Ambulatory Visit | Attending: Neurosurgery | Admitting: Neurosurgery

## 2013-08-25 DIAGNOSIS — M4802 Spinal stenosis, cervical region: Secondary | ICD-10-CM

## 2013-09-26 DIAGNOSIS — Z0289 Encounter for other administrative examinations: Secondary | ICD-10-CM

## 2013-10-12 ENCOUNTER — Ambulatory Visit: Payer: Self-pay | Admitting: Family Medicine

## 2013-10-15 ENCOUNTER — Encounter: Payer: Self-pay | Admitting: Family Medicine

## 2013-10-15 ENCOUNTER — Ambulatory Visit (INDEPENDENT_AMBULATORY_CARE_PROVIDER_SITE_OTHER): Payer: Self-pay | Admitting: Family Medicine

## 2013-10-15 VITALS — BP 142/94 | Ht 62.0 in | Wt 179.8 lb

## 2013-10-15 DIAGNOSIS — M19049 Primary osteoarthritis, unspecified hand: Secondary | ICD-10-CM | POA: Insufficient documentation

## 2013-10-15 DIAGNOSIS — Z23 Encounter for immunization: Secondary | ICD-10-CM

## 2013-10-15 DIAGNOSIS — I10 Essential (primary) hypertension: Secondary | ICD-10-CM

## 2013-10-15 MED ORDER — LOSARTAN POTASSIUM 100 MG PO TABS: ORAL_TABLET | ORAL | Status: DC

## 2013-10-15 NOTE — Progress Notes (Signed)
  Subjective:    Patient ID: Emily [name], female    DOB: 09/13/1951, 62 y.o.   MRN: [id number]  HPIHere for a follow up blood pressure. Dr. Jordan Likes prescribed Mobic for arthritis pain. She has been taking for about 1 month. Has noticed her blood pressure going up since starting the medicine.   Bilateral hand swelling. She relates hand swelling bilateral worse in the morning stiffness worse in the morning pain and discomfort into her hands. She is also followed by back specialists for her neck and lower back. Flu vaccine given today.  PMH extensive health problems including hypertension, arthritis, chronic back pain, knee pain Review of Systems She denies any current chest tightness pressure pain shortness breath nausea vomiting diarrhea.    Objective:   Physical Exam Lungs are clear hearts regular pulse normal extremities no edema skin warm dry neurologic grossly normal Blood pressure is elevated.      Assessment & Plan:  This patient unfortunately been unable to work. She has significant neck pain and discomfort also has significant pain when she tries to stand for any length of time causing lower part of her back to hurt a lot. She has a hard time bending with her knees because of arthritis. In addition to this she has constant pain and discomfort she hopes not to take narcotics so she stopped taking narcotics and they put her on Mobic and unfortunately that increased her blood pressure issues so we did have to adjust her blood pressure medicine. She ought to followup again in 3-4 months. This patient is disabled.  I will go ahead and check some lab work to rule out possible her rheumatoid arthritis

## 2013-10-16 ENCOUNTER — Encounter: Payer: Self-pay | Admitting: Orthopedic Surgery

## 2013-10-16 LAB — SEDIMENTATION RATE: Sed Rate: 4 mm/hr (ref 0–22)

## 2013-10-17 ENCOUNTER — Other Ambulatory Visit: Payer: Self-pay | Admitting: Family Medicine

## 2013-10-17 ENCOUNTER — Encounter: Payer: Self-pay | Admitting: Family Medicine

## 2013-11-06 ENCOUNTER — Ambulatory Visit (INDEPENDENT_AMBULATORY_CARE_PROVIDER_SITE_OTHER): Payer: Self-pay | Admitting: Family Medicine

## 2013-11-06 ENCOUNTER — Encounter: Payer: Self-pay | Admitting: Family Medicine

## 2013-11-06 VITALS — BP 128/82 | Temp 99.0°F | Ht 62.0 in | Wt 177.6 lb

## 2013-11-06 DIAGNOSIS — J019 Acute sinusitis, unspecified: Secondary | ICD-10-CM

## 2013-11-06 MED ORDER — CEFPROZIL 500 MG PO TABS: 500.0000 mg | ORAL_TABLET | Freq: Two times a day (BID) | ORAL | Status: DC

## 2013-11-06 MED ORDER — TRAMADOL HCL 50 MG PO TABS: 50.0000 mg | ORAL_TABLET | Freq: Four times a day (QID) | ORAL | Status: DC | PRN

## 2013-11-06 NOTE — Progress Notes (Signed)
   Subjective:    Patient ID: Emily [name], female    DOB: 12-05-1951, 62 y.o.   MRN: [id number]  Cough This is a new problem. The current episode started in the past 7 days. Associated symptoms include nasal congestion and a sore throat.   Started a few days ago with heada congestion and sore throat. Now with increase of Ha and cough occas wheeze No fever  Review of Systems  HENT: Positive for sore throat.   Respiratory: Positive for cough.        Objective:   Physical Exam  Vitals reviewed. Constitutional: She appears well-developed and well-nourished.  HENT:  Head: Normocephalic.  Right Ear: External ear normal.  Left Ear: External ear normal.  Mouth/Throat: Oropharynx is clear and moist.  Cardiovascular: Normal rate, regular rhythm and normal heart sounds.   No murmur heard. Pulmonary/Chest: Effort normal and breath sounds normal. No respiratory distress. She has no wheezes.  Lymphadenopathy:    She has no cervical adenopathy.          Assessment & Plan:  #1 acute sinusitis secondary to her lasting virus antibiotics prescribed should gradually get better warnings discussed #2 right shoulder lower than the left shoulder some weakness in the trapezius she will discuss this with her specialists

## 2013-11-19 ENCOUNTER — Other Ambulatory Visit: Payer: Self-pay | Admitting: *Deleted

## 2013-11-19 MED ORDER — GEMFIBROZIL 600 MG PO TABS: 600.0000 mg | ORAL_TABLET | Freq: Two times a day (BID) | ORAL | Status: DC

## 2013-12-03 ENCOUNTER — Other Ambulatory Visit: Payer: Self-pay | Admitting: Family Medicine

## 2013-12-06 ENCOUNTER — Other Ambulatory Visit: Payer: Self-pay | Admitting: Family Medicine

## 2013-12-10 ENCOUNTER — Telehealth: Payer: Self-pay | Admitting: Family Medicine

## 2013-12-10 NOTE — Telephone Encounter (Signed)
Pt went to fill scripts an ended up with a script she didn't request and wants to know if Dr Nicki Reaper is wanting her to start this med again?   cloNIDine (CATAPRES) 0.1 MG tablet  Please advise pt

## 2013-12-10 NOTE — Telephone Encounter (Signed)
Patient notified

## 2013-12-10 NOTE — Telephone Encounter (Signed)
Not sure where this Rx came from. I rec not being on this and monitoring her BP. Delete med from her list plz

## 2014-01-19 ENCOUNTER — Other Ambulatory Visit: Payer: Self-pay | Admitting: Family Medicine

## 2014-02-04 ENCOUNTER — Ambulatory Visit (INDEPENDENT_AMBULATORY_CARE_PROVIDER_SITE_OTHER): Payer: Self-pay | Admitting: Family Medicine

## 2014-02-04 ENCOUNTER — Encounter: Payer: Self-pay | Admitting: Family Medicine

## 2014-02-04 VITALS — BP 120/82 | Ht 62.0 in | Wt 180.2 lb

## 2014-02-04 DIAGNOSIS — M545 Low back pain, unspecified: Secondary | ICD-10-CM

## 2014-02-04 DIAGNOSIS — G4733 Obstructive sleep apnea (adult) (pediatric): Secondary | ICD-10-CM

## 2014-02-04 NOTE — Progress Notes (Signed)
   Subjective:    Patient ID: Emily [name], female    DOB: 1951-04-10, 63 y.o.   MRN: [id number]  HPI Patient arrives to discuss sleep apnea. Has been on CPAP for 6 years and wonders how often she needs to be retested. Patient stated she still snores. gets equipment through [employer] sleeps well with it. Has significant daytime sleepiness Machine stays at set pressure, her pressure gradyally gets to that level when she goes to sleep [place]  Trying to eat healthy Has gained  wireht-she is trying to lose weight she is going to start watching her diet and trying to be healthier with eating and try to do some walking but she's challenged that physical capabilities because of her disability Has carpal tunnel symptoms she is following with her neurosurgeon on this. Right hand pain.   Review of Systems Positive for fatigue and tiredness positive for neck and back pain positive for hand numbness, positive for weight gain    Objective:   Physical Exam On physical exam patient has significant discomfort in her cervical and lumbar spine. Lungs are clear hearts regular. Neck no masses       Assessment & Plan:  Sleep titration study- needs to be done, pt will talk with insur co then get back to [place]. To try to lose weight-better diet more activity if possible No labs today- later in summer Disabled - back and shoulder. Patient is disabled she is filing for disability. Awaiting the appeal. She has her orthopedic surgeon who told her because of her chronic right shoulder pain she cannot lift any heavy items or lift items above her shoulders. She also has persistent back pain for which a specialist is limited in her to avoid all bending squatting twisting. Patient also has difficult time sitting or standing for any length of time because of pain

## 2014-02-07 ENCOUNTER — Ambulatory Visit (INDEPENDENT_AMBULATORY_CARE_PROVIDER_SITE_OTHER): Payer: Self-pay

## 2014-02-07 ENCOUNTER — Ambulatory Visit (INDEPENDENT_AMBULATORY_CARE_PROVIDER_SITE_OTHER): Payer: Medicaid Other | Admitting: Orthopedic Surgery

## 2014-02-07 ENCOUNTER — Encounter: Payer: Self-pay | Admitting: Orthopedic Surgery

## 2014-02-07 VITALS — BP 142/82 | Ht 62.0 in | Wt 175.0 lb

## 2014-02-07 DIAGNOSIS — IMO0002 Reserved for concepts with insufficient information to code with codable children: Secondary | ICD-10-CM

## 2014-02-07 DIAGNOSIS — M1710 Unilateral primary osteoarthritis, unspecified knee: Secondary | ICD-10-CM

## 2014-02-07 DIAGNOSIS — M25569 Pain in unspecified knee: Secondary | ICD-10-CM

## 2014-02-07 DIAGNOSIS — M23329 Other meniscus derangements, posterior horn of medial meniscus, unspecified knee: Secondary | ICD-10-CM

## 2014-02-07 DIAGNOSIS — M25562 Pain in left knee: Principal | ICD-10-CM

## 2014-02-07 DIAGNOSIS — G5600 Carpal tunnel syndrome, unspecified upper limb: Secondary | ICD-10-CM

## 2014-02-07 DIAGNOSIS — M25561 Pain in right knee: Secondary | ICD-10-CM

## 2014-02-07 DIAGNOSIS — G5603 Carpal tunnel syndrome, bilateral upper limbs: Secondary | ICD-10-CM

## 2014-02-07 DIAGNOSIS — Z981 Arthrodesis status: Secondary | ICD-10-CM

## 2014-02-07 NOTE — Patient Instructions (Addendum)
MRI ordered Needs to sign medial release to get NCS from DR. Barko

## 2014-02-07 NOTE — Progress Notes (Signed)
Patient ID: Emily [name], female   DOB: 31-Dec-1950, 63 y.o.   MRN: [id number]  Encounter Diagnoses  Name Primary?  . Bilateral knee pain   . Derangement of posterior horn of medial meniscus Yes  . KNEE, ARTHRITIS, DEGEN./OSTEO   . Carpal tunnel syndrome, bilateral   . S/P lumbar spinal fusion     Chief Complaint  Patient presents with  . Knee Pain    Bilateral knee pain, had left knee surgery 2011  . Hand Problem    bilateral numbness tingling   . Back Problem    h/o fusion    History  #1 postoperative annual x-rays left total knee [place] last data surgery 02/13/2010 so this is a poor your followup  #2 bilateral hand pain numbness and tingling, had nerve conduction study we need to get a copy  #3 right knee pain. Patient status post fusion for the second time at a different level. Complains of pain after riding the bike during her rehabilitation. She's on Mobic complains of sharp dull throbbing stabbing burning pain in the range of 6-9/10 which is intermittent worse during the day and with exercise sometimes even without exercise and sometimes at night. Reports no improvement from any measures but nothing makes it worse  While she was on the bike at rehabilitation she felt something slip in the right knee on the medial aspect.  Of systems weight gain snoring heartburn numbness tingling unsteady gait depression excessive thirst seasonal allergies muscle and joint pain  Allergies to codeine sulfa drugs Ultram Celebrex  The past, family history and social history have been reviewed and are recorded in the corresponding sections of epic   Vital signs:   General the patient is well-developed and well-nourished grooming and hygiene are normal Oriented x3 Mood and affect normal Ambulation normal  Inspection of the upper extremities reveal swelling in the carpal phalangeal joints especially on the right hand index finger and ring finger. No motion deficits no joint  instability motor exam normal skin clean dry and intact no rash sensory changes not detectable on physical exam normal pulse and perfusion  Right knee valgus alignment medial tenderness lateral tenderness crepitance in the patellofemoral joint painful patella compression test ligament stable motor exam normal skin warm dry and intact distal neurovascular function normal  Left total knee no issues at this time exam was normal  Assessment.  The left total knee seems stable at 4 years post op  The lumbar spine issues can be addressed with the surgeon   Carpal tunnel tests need to be brought on next visit for evaluation for possible surgery  Meniscal derangement should be addressed with MRI. Surgery possible.

## 2014-02-25 DIAGNOSIS — Z0289 Encounter for other administrative examinations: Secondary | ICD-10-CM

## 2014-05-09 ENCOUNTER — Ambulatory Visit (INDEPENDENT_AMBULATORY_CARE_PROVIDER_SITE_OTHER): Payer: Self-pay | Admitting: Nurse Practitioner

## 2014-05-09 ENCOUNTER — Encounter: Payer: Self-pay | Admitting: Nurse Practitioner

## 2014-05-09 VITALS — BP 134/80 | Temp 98.7°F | Ht 62.0 in | Wt 177.0 lb

## 2014-05-09 DIAGNOSIS — L609 Nail disorder, unspecified: Secondary | ICD-10-CM

## 2014-05-09 DIAGNOSIS — L608 Other nail disorders: Secondary | ICD-10-CM

## 2014-05-14 ENCOUNTER — Encounter: Payer: Self-pay | Admitting: Nurse Practitioner

## 2014-05-14 NOTE — Progress Notes (Signed)
Subjective:  Presents for c/o pain of the left great toe for about 6 weeks. Using OTC topical antifungal meds but still painful especially wearing regular shoes.   Objective:   BP 134/80  Temp(Src) 98.7 F (37.1 C) (Oral)  Ht 5\' 2"  (1.575 m)  Wt 177 lb (80.287 kg)  BMI 32.37 kg/m2 Left great toe: medial aspect of nail slightly discolored. Minimal separation from nailbed. No drainage or odor. Tenderness with palpation of nail and end of toe. Minimal edema. No evidence of bacterial infection or ingrown toenail. Signs of pinched nail.  Assessment: Toenail deformity  Plan: patient to make appt with podiatrist for further evaluation. Reviewed signs of infection. Call back sooner if needed.

## 2014-05-15 ENCOUNTER — Other Ambulatory Visit: Payer: Self-pay | Admitting: Family Medicine

## 2014-05-17 ENCOUNTER — Telehealth: Payer: Self-pay | Admitting: Family Medicine

## 2014-05-17 MED ORDER — MELOXICAM 15 MG PO TABS
15.0000 mg | ORAL_TABLET | Freq: Every day | ORAL | Status: DC
Start: 2014-05-17 — End: 2014-06-11

## 2014-05-17 NOTE — Telephone Encounter (Signed)
Meloxicam (MOBIC PO)  Pt gets the script through Dr Trenton Gammon, she reordered it over a  Week ago but has not got the refill.   She has tried to call them on numerous occassions as well as left  mssg at the office but she has not heard anything. She has called the Pharmacy an they tell her to call the Dr's office which she has done.   She is currently out of this med an wants to know if you will refill it for her?   [facility]

## 2014-05-17 NOTE — Telephone Encounter (Signed)
Meloxicam 15 mg 1 daily, #30, 3 refills, try to verify with the patient that it's 15 mg. The other option is 7.5 mg. Either one is fine for [place] to order.

## 2014-05-17 NOTE — Telephone Encounter (Signed)
Patient states she is on the 15 mg daily. Rx sent electronically to pharmacy. Patient notified.

## 2014-06-11 ENCOUNTER — Other Ambulatory Visit: Payer: Self-pay | Admitting: Family Medicine

## 2014-06-19 ENCOUNTER — Other Ambulatory Visit: Payer: Self-pay | Admitting: Family Medicine

## 2014-07-12 ENCOUNTER — Ambulatory Visit (INDEPENDENT_AMBULATORY_CARE_PROVIDER_SITE_OTHER): Payer: Self-pay | Admitting: Nurse Practitioner

## 2014-07-12 ENCOUNTER — Encounter: Payer: Self-pay | Admitting: Nurse Practitioner

## 2014-07-12 VITALS — BP 132/90 | Ht 62.0 in | Wt 176.0 lb

## 2014-07-12 DIAGNOSIS — Z79899 Other long term (current) drug therapy: Secondary | ICD-10-CM

## 2014-07-12 DIAGNOSIS — I10 Essential (primary) hypertension: Secondary | ICD-10-CM

## 2014-07-12 DIAGNOSIS — N3944 Nocturnal enuresis: Secondary | ICD-10-CM

## 2014-07-12 DIAGNOSIS — R5381 Other malaise: Secondary | ICD-10-CM

## 2014-07-12 DIAGNOSIS — R5383 Other fatigue: Secondary | ICD-10-CM

## 2014-07-12 DIAGNOSIS — E781 Pure hyperglyceridemia: Secondary | ICD-10-CM

## 2014-07-12 MED ORDER — BACLOFEN 10 MG PO TABS: ORAL_TABLET | ORAL | Status: DC

## 2014-07-12 MED ORDER — VERAPAMIL HCL ER 180 MG PO TBCR: EXTENDED_RELEASE_TABLET | ORAL | Status: DC

## 2014-07-12 MED ORDER — GEMFIBROZIL 600 MG PO TABS: 600.0000 mg | ORAL_TABLET | Freq: Two times a day (BID) | ORAL | Status: DC

## 2014-07-12 MED ORDER — LOSARTAN POTASSIUM 100 MG PO TABS: ORAL_TABLET | ORAL | Status: DC

## 2014-07-16 ENCOUNTER — Encounter: Payer: Self-pay | Admitting: Nurse Practitioner

## 2014-07-16 NOTE — Progress Notes (Signed)
Subjective:  Presents for routine follow up. C/o urinary incontinence that occurs only at night. Off/on x 2 years. Nightly in the month of June. Checked her BS several times; 96-105. Has sleep apnea. Will be changing her CPAP machine soon. No problems during the day. Does not seem to be associated with food or drink. Some increased stress. No episodes x 1 1/2 weeks. Seeing [facility] for mental health. They increased Celexa to 40 mg but this was too much, she went back to 20. Next appt is in September. No changes in medications. No new OTC meds or supplements. Has not taken BP meds this am. BP 118/74 at home. No CP, ischemic type pain or SOB. No edema. No urinary symptoms.   Objective:   BP 132/90  Ht 5\' 2"  (1.575 m)  Wt 176 lb (79.833 kg)  BMI 32.18 kg/m2 NAD. Alert, oriented. Lungs clear. Heart RRR. Lower extremities no edema.   Assessment: Nocturnal enuresis  HTN (hypertension), benign - Plan: Basic metabolic panel  Hypertriglyceridemia - Plan: Lipid panel  Encounter for long-term (current) use of other medications - Plan: Hepatic function panel  Other malaise and fatigue - Plan: TSH  Plan:  Meds ordered this encounter  Medications  . baclofen (LIORESAL) 10 MG tablet    Sig: One po qhs prn    Dispense:  30 each    Refill:  2    Order Specific Question:  Supervising Provider    Answer:  Mikey Kirschner [[id number]]  . gemfibrozil (LOPID) 600 MG tablet    Sig: Take 1 tablet (600 mg total) by mouth 2 (two) times daily before a meal.    Dispense:  60 tablet    Refill:  5    Order Specific Question:  Supervising Provider    Answer:  Mikey Kirschner [[id number]]  . losartan (COZAAR) 100 MG tablet    Sig: TAKE ONE TABLET BY MOUTH ONCE DAILY    Dispense:  30 tablet    Refill:  5    Order Specific Question:  Supervising Provider    Answer:  Mikey Kirschner [[id number]]  . verapamil (CALAN-SR) 180 MG CR tablet    Sig: TAKE ONE TABLET BY MOUTH IN THE MORNING ( NEEDS OFFICE VISIT)    Dispense:  30  tablet    Refill:  5    Order Specific Question:  Supervising Provider    Answer:  Mikey Kirschner [[id number]]   To obtain a new CPAP machine. Continue follow up with mental health. Discussed importance of stress reduction. Trial of Baclofen to see if this will correct symptoms. Call back if recurs. Return in about 6 months (around 01/12/2015).

## 2014-11-12 ENCOUNTER — Ambulatory Visit (INDEPENDENT_AMBULATORY_CARE_PROVIDER_SITE_OTHER): Payer: Self-pay | Admitting: *Deleted

## 2014-11-12 DIAGNOSIS — Z23 Encounter for immunization: Secondary | ICD-10-CM

## 2014-12-31 ENCOUNTER — Telehealth: Payer: Self-pay | Admitting: *Deleted

## 2014-12-31 NOTE — Telephone Encounter (Signed)
No entry 

## 2015-01-27 ENCOUNTER — Other Ambulatory Visit: Payer: Self-pay | Admitting: *Deleted

## 2015-01-27 NOTE — Progress Notes (Signed)
ERROR/NO ENTRY

## 2015-03-18 ENCOUNTER — Telehealth: Payer: Self-pay | Admitting: Family Medicine

## 2015-03-18 NOTE — Telephone Encounter (Signed)
Pt notified form ready for pick up 

## 2015-03-18 NOTE — Telephone Encounter (Signed)
Pt dropped off a form to be filled out for a handicap placard.

## 2015-04-02 ENCOUNTER — Other Ambulatory Visit: Payer: Self-pay | Admitting: Family Medicine

## 2015-04-24 DIAGNOSIS — Z029 Encounter for administrative examinations, unspecified: Secondary | ICD-10-CM

## 2015-05-11 ENCOUNTER — Other Ambulatory Visit: Payer: Self-pay | Admitting: Family Medicine

## 2015-05-12 NOTE — Telephone Encounter (Signed)
Needs office visit.

## 2015-05-15 ENCOUNTER — Other Ambulatory Visit: Payer: Self-pay | Admitting: Family Medicine

## 2015-07-16 ENCOUNTER — Ambulatory Visit (INDEPENDENT_AMBULATORY_CARE_PROVIDER_SITE_OTHER): Payer: Self-pay | Admitting: Family Medicine

## 2015-07-16 ENCOUNTER — Encounter: Payer: Self-pay | Admitting: Family Medicine

## 2015-07-16 VITALS — BP 122/82 | Ht 62.0 in | Wt 177.2 lb

## 2015-07-16 DIAGNOSIS — E785 Hyperlipidemia, unspecified: Secondary | ICD-10-CM

## 2015-07-16 DIAGNOSIS — Z79899 Other long term (current) drug therapy: Secondary | ICD-10-CM

## 2015-07-16 MED ORDER — CITALOPRAM HYDROBROMIDE 20 MG PO TABS: 20.0000 mg | ORAL_TABLET | Freq: Every day | ORAL | Status: DC

## 2015-07-16 MED ORDER — VERAPAMIL HCL ER 180 MG PO TBCR: EXTENDED_RELEASE_TABLET | ORAL | Status: DC

## 2015-07-16 MED ORDER — GEMFIBROZIL 600 MG PO TABS: 600.0000 mg | ORAL_TABLET | Freq: Two times a day (BID) | ORAL | Status: DC

## 2015-07-16 MED ORDER — LOSARTAN POTASSIUM 100 MG PO TABS: ORAL_TABLET | ORAL | Status: DC

## 2015-07-16 NOTE — Progress Notes (Signed)
   Subjective:    Patient ID: Emily [name], female    DOB: 1951/04/01, 64 y.o.   MRN: [id number]  Hypertension This is a chronic problem. The current episode started more than 1 year ago. Pertinent negatives include no chest pain. Risk factors for coronary artery disease include dyslipidemia, obesity and post-menopausal state. Treatments tried: losartin. There are no compliance problems.    Patient has history of hyperlipidemia triglyceridemia takes gemfibrozil twice daily Patient relates has depression issues takes Celexa takes 20 mg a day does a good job for her She sees her neurosurgeon on a regular basis who has her on both muscle relaxers and anti-inflammatory's.   Review of Systems  Constitutional: Negative for activity change, appetite change and fatigue.  HENT: Negative for congestion.   Respiratory: Negative for cough.   Cardiovascular: Negative for chest pain.  Gastrointestinal: Negative for abdominal pain.  Endocrine: Negative for polydipsia and polyphagia.  Neurological: Negative for weakness.  Psychiatric/Behavioral: Negative for confusion.       Objective:   Physical Exam  Constitutional: She appears well-nourished. No distress.  Cardiovascular: Normal rate, regular rhythm and normal heart sounds.   No murmur heard. Pulmonary/Chest: Effort normal and breath sounds normal. No respiratory distress.  Musculoskeletal: She exhibits no edema.  Lymphadenopathy:    She has no cervical adenopathy.  Neurological: She is alert. She exhibits normal muscle tone.  Psychiatric: Her behavior is normal.  Vitals reviewed.         Assessment & Plan:  Chronic back issues follow-up neurosurgery HTN good control continue current measures History hyperlipidemia continue him 5 result twice daily check lipid and liver panel Because of HTN check metabolic 7 Because of high risk medications check liver Patient follow-up in 6 months watch diet stay physically active

## 2015-07-16 NOTE — Patient Instructions (Signed)
Dear Patient,  It has been recommended to you that you have a colonoscopy. It is your responsibility to carry through with this recommendation.   Did you realize that colon cancer is the second leading cancer killer in the [place]. One in every 20 adults will get colon cancer. If all adults would go through the recommended screening for colon cancer (getting a colonoscopy), then there would be a 60% reduction in the number of people dying from colon cancer.  Colon cancer just doesn't come out of the blue. It starts off as a small polyp which over time grows into a cancer. A colonoscopy can prevent cancer and in many cases detected when it is at a very treatable phase. Small colon cancers can have cure rates of 95%. Advanced colon cancer, which often occurs in people who do not do their screenings, have cure rates less than 20%. The risk of colon cancer advances with age. Most adults should have regular colonoscopies every 10 years starting at age 50. This recommendation can vary depending on a person's medical history.  Health-care laws now allow for you to call the gastroenterologist office directly in order to set yourself up for this very important tests. Today we have recommended to you that you do this test. This test may save your life. Failure to do this test puts you at risk for premature death from colon cancer. Do the right thing and schedule this test now.  Here as a list of specialists we recommend in the surrounding area. When you call their office let them know that you are a patient of our practice in your interested in doing a screening colonoscopy. They should assist you without problems. You will need the following information when you called them: 1-name of which Dr. you see, 2-your insurance information, 3-a list of medications that you currently take, 4-any allergies you have to medications.  [place] gastroenterologist Dr. Mike Rourk, Dr Sandi Fields   [place]  gastroenterologist   [phone]  Dr.Najeeb Rehman [facility]   [phone]  [place] [facility] (Dr. Perry, N, Stark, Brodie, Gesner, Jacobs and Pyrtle) [phone]  [facility] (Dr. Buscemi, Edwards, Hayes, Maygod,Outlaw,Schooler) [phone]  Each group of specialists has assured us that when you called them they will help you get your colonoscopy set up. Should you have problems please let us know. Be sure to call soon. Sincerely, Carolyn Hoskins, Dr Steve Edahi Kroening, Dr.Katy Brickell    

## 2015-11-04 ENCOUNTER — Ambulatory Visit (INDEPENDENT_AMBULATORY_CARE_PROVIDER_SITE_OTHER): Payer: PPO | Admitting: *Deleted

## 2015-11-04 DIAGNOSIS — Z23 Encounter for immunization: Secondary | ICD-10-CM | POA: Diagnosis not present

## 2015-11-07 ENCOUNTER — Other Ambulatory Visit: Payer: Self-pay | Admitting: Family Medicine

## 2015-11-07 DIAGNOSIS — Z1231 Encounter for screening mammogram for malignant neoplasm of breast: Secondary | ICD-10-CM

## 2015-11-12 ENCOUNTER — Ambulatory Visit (HOSPITAL_COMMUNITY)
Admission: RE | Admit: 2015-11-12 | Discharge: 2015-11-12 | Disposition: A | Discharge status: Home / Self Care | Payer: PPO | Source: Ambulatory Visit | Attending: Family Medicine | Admitting: Family Medicine

## 2015-11-12 DIAGNOSIS — Z1231 Encounter for screening mammogram for malignant neoplasm of breast: Secondary | ICD-10-CM | POA: Insufficient documentation

## 2015-11-13 LAB — BASIC METABOLIC PANEL
BUN / CREAT RATIO: 19 (ref 11–26)
BUN: 13 mg/dL (ref 8–27)
CALCIUM: 9.5 mg/dL (ref 8.7–10.3)
CO2: 23 mmol/L (ref 18–29)
CREATININE: 0.68 mg/dL (ref 0.57–1.00)
Chloride: 104 mmol/L (ref 97–106)
GFR calc non Af Amer: 93 mL/min/{1.73_m2} (ref >59)
GFR, EST AFRICAN AMERICAN: 108 mL/min/{1.73_m2} (ref >59)
Glucose: 93 mg/dL (ref 65–99)
Potassium: 4.4 mmol/L (ref 3.5–5.2)
Sodium: 143 mmol/L (ref 136–144)

## 2015-11-13 LAB — HEPATIC FUNCTION PANEL
ALK PHOS: 58 IU/L (ref 39–117)
ALT: 16 IU/L (ref 0–32)
AST: 19 IU/L (ref 0–40)
Albumin: 4.7 g/dL (ref 3.6–4.8)
BILIRUBIN TOTAL: 0.4 mg/dL (ref 0.0–1.2)
Bilirubin, Direct: 0.09 mg/dL (ref 0.00–0.40)
Total Protein: 6.9 g/dL (ref 6.0–8.5)

## 2015-11-13 LAB — LIPID PANEL
Chol/HDL Ratio: 4.9 ratio units — ABNORMAL HIGH (ref 0.0–4.4)
Cholesterol, Total: 180 mg/dL (ref 100–199)
HDL: 37 mg/dL — AB (ref >39)
LDL Calculated: 103 mg/dL — ABNORMAL HIGH (ref 0–99)
TRIGLYCERIDES: 200 mg/dL — AB (ref 0–149)
VLDL Cholesterol Cal: 40 mg/dL (ref 5–40)

## 2015-11-26 ENCOUNTER — Encounter: Payer: Self-pay | Admitting: Nurse Practitioner

## 2015-11-26 ENCOUNTER — Ambulatory Visit (INDEPENDENT_AMBULATORY_CARE_PROVIDER_SITE_OTHER): Payer: PPO | Admitting: Nurse Practitioner

## 2015-11-26 VITALS — BP 112/70 | Ht 61.0 in | Wt 174.4 lb

## 2015-11-26 DIAGNOSIS — Z0000 Encounter for general adult medical examination without abnormal findings: Secondary | ICD-10-CM

## 2015-11-26 DIAGNOSIS — Z13820 Encounter for screening for osteoporosis: Secondary | ICD-10-CM | POA: Diagnosis not present

## 2015-11-26 DIAGNOSIS — Z1151 Encounter for screening for human papillomavirus (HPV): Secondary | ICD-10-CM

## 2015-11-26 DIAGNOSIS — Z124 Encounter for screening for malignant neoplasm of cervix: Secondary | ICD-10-CM | POA: Diagnosis not present

## 2015-11-27 ENCOUNTER — Encounter: Payer: Self-pay | Admitting: Nurse Practitioner

## 2015-11-27 NOTE — Progress Notes (Signed)
   Subjective:    Patient ID: Emily [name], female    DOB: 01-Oct-1951, 64 y.o.   MRN: [id number]  HPI presents for her wellness exam. No vaginal bleeding or pelvic pain. Married, same sexual partner. Regular vision exams. Wears upper dentures. No mouth sores.     Review of Systems  Constitutional: Negative for activity change, appetite change and fatigue.  HENT: Negative for dental problem, ear pain, sinus pressure and sore throat.   Respiratory: Negative for cough, chest tightness, shortness of breath and wheezing.   Cardiovascular: Negative for chest pain.  Gastrointestinal: Negative for nausea, vomiting, abdominal pain, diarrhea, constipation and abdominal distention.  Genitourinary: Negative for dysuria, urgency, frequency, vaginal bleeding, vaginal discharge, enuresis, difficulty urinating, genital sores and pelvic pain.       Objective:   Physical Exam  Constitutional: She is oriented to person, place, and time. She appears well-developed. No distress.  HENT:  Right Ear: External ear normal.  Left Ear: External ear normal.  Mouth/Throat: Oropharynx is clear and moist.  Neck: Normal range of motion. Neck supple. No tracheal deviation present. No thyromegaly present.  Cardiovascular: Normal rate, regular rhythm and normal heart sounds.  Exam reveals no gallop.   No murmur heard. Pulmonary/Chest: Effort normal and breath sounds normal.  Abdominal: Soft. She exhibits no distension. There is no tenderness.  Genitourinary: Vagina normal and uterus normal. No vaginal discharge found.  External GU: no rashes or lesions. Vagina: clear discharge; no CMT. Bimanual exam: no tenderness or obvious masses. Rectal exam: no masses or stool for hemoccult.   Musculoskeletal: She exhibits no edema.  Lymphadenopathy:    She has no cervical adenopathy.  Neurological: She is alert and oriented to person, place, and time.  Skin: Skin is warm and dry. No rash noted.  Psychiatric: She has a normal  mood and affect. Her behavior is normal.  Vitals reviewed. Breast exam: no masses; axillae no adenopathy.         Assessment & Plan:  Routine general medical examination at a health care facility - Plan: Pap IG and HPV (high risk) DNA detection  Screening for cervical cancer - Plan: Pap IG and HPV (high risk) DNA detection  Screening for HPV (human papillomavirus) - Plan: Pap IG and HPV (high risk) DNA detection  Screening for osteoporosis - Plan: DG Bone Density  Recommend activity as tolerated; daily vitamin D/calcium supplement. Given prescription for Zostavax and information for colonoscopy.  Return in about 1 year (around 11/25/2016) for physical.

## 2015-11-28 ENCOUNTER — Ambulatory Visit (HOSPITAL_COMMUNITY)
Admission: RE | Admit: 2015-11-28 | Discharge: 2015-11-28 | Disposition: A | Discharge status: Home / Self Care | Payer: PPO | Source: Ambulatory Visit | Attending: Nurse Practitioner | Admitting: Nurse Practitioner

## 2015-11-28 DIAGNOSIS — Z780 Asymptomatic menopausal state: Secondary | ICD-10-CM | POA: Diagnosis not present

## 2015-11-28 DIAGNOSIS — Z9889 Other specified postprocedural states: Secondary | ICD-10-CM | POA: Diagnosis not present

## 2015-11-28 DIAGNOSIS — Z13820 Encounter for screening for osteoporosis: Secondary | ICD-10-CM | POA: Insufficient documentation

## 2015-12-04 LAB — PAP IG AND HPV HIGH-RISK
HPV, HIGH-RISK: NEGATIVE
PAP Smear Comment: 0

## 2015-12-10 DIAGNOSIS — G5602 Carpal tunnel syndrome, left upper limb: Secondary | ICD-10-CM | POA: Diagnosis not present

## 2016-01-30 ENCOUNTER — Ambulatory Visit (INDEPENDENT_AMBULATORY_CARE_PROVIDER_SITE_OTHER): Payer: PPO | Admitting: Family Medicine

## 2016-01-30 ENCOUNTER — Encounter: Payer: Self-pay | Admitting: Family Medicine

## 2016-01-30 VITALS — BP 122/80 | Temp 98.8°F | Ht 62.0 in | Wt 173.4 lb

## 2016-01-30 DIAGNOSIS — R1013 Epigastric pain: Secondary | ICD-10-CM

## 2016-01-30 DIAGNOSIS — I10 Essential (primary) hypertension: Secondary | ICD-10-CM | POA: Diagnosis not present

## 2016-01-30 MED ORDER — LOSARTAN POTASSIUM 100 MG PO TABS: ORAL_TABLET | ORAL | Status: DC

## 2016-01-30 MED ORDER — GEMFIBROZIL 600 MG PO TABS: 600.0000 mg | ORAL_TABLET | Freq: Two times a day (BID) | ORAL | Status: DC

## 2016-01-30 MED ORDER — PANTOPRAZOLE SODIUM 40 MG PO TBEC: 40.0000 mg | DELAYED_RELEASE_TABLET | Freq: Every day | ORAL | Status: DC

## 2016-01-30 NOTE — Progress Notes (Signed)
   Subjective:    Patient ID: Emily [name], female    DOB: January 26, 1951, 65 y.o.   MRN: [id number]  HPI  Patient arrives with c/o abdominal pain, nausea and gas for 2 weeks. Patient has tried zantac, phaszyme and probiotic.  patient patient relates intermittent nausea intermittent regurgitation reflux sometimes epigastric pain does not radiate to the back denies fever chills sweats. Denies rectal bleeding. Patient is due for colonoscopy. This was advised to her. Review of Systems  no chest pressure no shortness of breath. No swelling in the legs. No rash    See above. Objective:   Physical Exam  lungs clear heart regular abdomen moderate epigastric discomfort could be related to her PPI no guarding rebound extremities no edema   she's never had this problem before. She does take her Zantac but she also uses a anti-inflammatory on regular basis.     Assessment & Plan:   abdominal pain-is hard to discern what is causing this at this point but it could be due to the mobility she is taking. I recommend stopping mobic. Also recommend using  Pantoprazole daily. In addition to this we will do lab work. If not dramatically better within 2 weeks referral to gastroenterology  Follow-up for regular health issues in approximately 2-3 months.

## 2016-01-31 LAB — BASIC METABOLIC PANEL
BUN/Creatinine Ratio: 23 (ref 11–26)
BUN: 17 mg/dL (ref 8–27)
CALCIUM: 10 mg/dL (ref 8.7–10.3)
CHLORIDE: 101 mmol/L (ref 96–106)
CO2: 22 mmol/L (ref 18–29)
Creatinine, Ser: 0.75 mg/dL (ref 0.57–1.00)
GFR calc Af Amer: 98 mL/min/{1.73_m2} (ref >59)
GFR, EST NON AFRICAN AMERICAN: 85 mL/min/{1.73_m2} (ref >59)
Glucose: 81 mg/dL (ref 65–99)
POTASSIUM: 4.4 mmol/L (ref 3.5–5.2)
Sodium: 142 mmol/L (ref 134–144)

## 2016-01-31 LAB — CBC WITH DIFFERENTIAL/PLATELET
Basophils Absolute: 0.1 10*3/uL (ref 0.0–0.2)
Basos: 1 %
EOS (ABSOLUTE): 0.3 10*3/uL (ref 0.0–0.4)
EOS: 5 %
HEMATOCRIT: 40.1 % (ref 34.0–46.6)
HEMOGLOBIN: 14.1 g/dL (ref 11.1–15.9)
IMMATURE GRANULOCYTES: 0 %
Immature Grans (Abs): 0 10*3/uL (ref 0.0–0.1)
LYMPHS: 30 %
Lymphocytes Absolute: 1.8 10*3/uL (ref 0.7–3.1)
MCH: 32.6 pg (ref 26.6–33.0)
MCHC: 35.2 g/dL (ref 31.5–35.7)
MCV: 93 fL (ref 79–97)
MONOCYTES: 9 %
MONOS ABS: 0.5 10*3/uL (ref 0.1–0.9)
NEUTROS PCT: 55 %
Neutrophils Absolute: 3.4 10*3/uL (ref 1.4–7.0)
Platelets: 295 10*3/uL (ref 150–379)
RBC: 4.33 x10E6/uL (ref 3.77–5.28)
RDW: 13.8 % (ref 12.3–15.4)
WBC: 6.1 10*3/uL (ref 3.4–10.8)

## 2016-01-31 LAB — HEPATIC FUNCTION PANEL
ALBUMIN: 4.8 g/dL (ref 3.6–4.8)
ALT: 20 IU/L (ref 0–32)
AST: 18 IU/L (ref 0–40)
Alkaline Phosphatase: 70 IU/L (ref 39–117)
BILIRUBIN TOTAL: 0.3 mg/dL (ref 0.0–1.2)
Bilirubin, Direct: 0.1 mg/dL (ref 0.00–0.40)
TOTAL PROTEIN: 7.6 g/dL (ref 6.0–8.5)

## 2016-01-31 LAB — LIPASE: Lipase: 31 U/L (ref 0–59)

## 2016-02-05 ENCOUNTER — Encounter: Payer: Self-pay | Admitting: Family Medicine

## 2016-02-06 ENCOUNTER — Encounter (INDEPENDENT_AMBULATORY_CARE_PROVIDER_SITE_OTHER): Payer: Self-pay | Admitting: *Deleted

## 2016-02-12 ENCOUNTER — Telehealth: Payer: Self-pay | Admitting: Family Medicine

## 2016-02-12 NOTE — Telephone Encounter (Signed)
Pt calling to say that she is in a great deal of pain since stopping the use of Mobic Which she had to stop using due to abd pains. You put her on protonix which she feels Is increasing her pain and would like to stop? Would like some advice and wants to know what she  Can take to help with her pain and stay on the protonix. She states her abd pains are better since stopping the  Mobic but not better  Please advise

## 2016-02-12 NOTE — Telephone Encounter (Signed)
Please read the office note from February. At that time I was concerned the patient may be developing ulcer. She was prescribed PPI and instructed to stop mode back. I'm sympathetic to her situation. Nurse's-please talk with the patient find out in regards to what ways is she worse? In what ways issue better?

## 2016-02-12 NOTE — Telephone Encounter (Signed)
Patient stated she is having significant joint pain all over since stopping the mobic but her stomach pain is improved on protonix but is wanting to know if there is a med she can take to help the joint pain while continuing the protonix for her stomach.

## 2016-02-12 NOTE — Telephone Encounter (Signed)
Discussed with patient. Patient advised Unfortunately mobic is probably the safest. Dr Nicki Reaper would do not feel comfortable restarting Mobic just yet. Dr Nicki Reaper would recommend appointment with gastroenterology have them check out her stomach make sure she is not having any ulcer issues continue protonix if after gastroenterology evaluation they feel her stomach seems to be doing okay then we could consider starting low back at that point. Dr Nicki Reaper does not recommend other anti-inflammatories because they are actually more harmful to the lining of the stomach which could increased risk of ulcers. Patient verbalized understanding and stated she already has appt with Gi-Dr Laural Golden 03/03/16 for evaluation.

## 2016-02-12 NOTE — Telephone Encounter (Signed)
Unfortunately mobility is probably the safest. I would do not feel comfortable restarting Mobic just yet. I would recommend appointment with gastroenterology have them check out her stomach make sure she is not having any ulcer issues continue protonix if after gastroenterology evaluation they feel her stomach seems to be doing okay then we could consider starting low back at that point. I do not recommend other anti-inflammatories because they are actually more harmful to the lining of the stomach which could increased risk of ulcers

## 2016-02-12 NOTE — Telephone Encounter (Signed)
[facility] 02/12/16

## 2016-02-27 ENCOUNTER — Other Ambulatory Visit: Payer: Self-pay | Admitting: Family Medicine

## 2016-02-27 NOTE — Telephone Encounter (Signed)
4 refills each thanks

## 2016-02-27 NOTE — Telephone Encounter (Signed)
May we refill celexa?

## 2016-03-03 ENCOUNTER — Encounter (INDEPENDENT_AMBULATORY_CARE_PROVIDER_SITE_OTHER): Payer: Self-pay | Admitting: Internal Medicine

## 2016-03-03 ENCOUNTER — Encounter (INDEPENDENT_AMBULATORY_CARE_PROVIDER_SITE_OTHER): Payer: Self-pay | Admitting: *Deleted

## 2016-03-03 ENCOUNTER — Other Ambulatory Visit (INDEPENDENT_AMBULATORY_CARE_PROVIDER_SITE_OTHER): Payer: Self-pay | Admitting: *Deleted

## 2016-03-03 ENCOUNTER — Other Ambulatory Visit (INDEPENDENT_AMBULATORY_CARE_PROVIDER_SITE_OTHER): Payer: Self-pay | Admitting: Internal Medicine

## 2016-03-03 ENCOUNTER — Ambulatory Visit (INDEPENDENT_AMBULATORY_CARE_PROVIDER_SITE_OTHER): Payer: PPO | Admitting: Internal Medicine

## 2016-03-03 VITALS — BP 112/60 | HR 80 | Temp 98.7°F | Ht 62.0 in | Wt 178.9 lb

## 2016-03-03 DIAGNOSIS — R1013 Epigastric pain: Secondary | ICD-10-CM

## 2016-03-03 DIAGNOSIS — Z1211 Encounter for screening for malignant neoplasm of colon: Secondary | ICD-10-CM

## 2016-03-03 DIAGNOSIS — K219 Gastro-esophageal reflux disease without esophagitis: Secondary | ICD-10-CM | POA: Diagnosis not present

## 2016-03-03 MED ORDER — PEG 3350-KCL-NA BICARB-NACL 420 G PO SOLR: 4000.0000 mL | Freq: Once | ORAL | Status: DC

## 2016-03-03 MED ORDER — PANTOPRAZOLE SODIUM 40 MG PO TBEC: 40.0000 mg | DELAYED_RELEASE_TABLET | Freq: Two times a day (BID) | ORAL | Status: DC

## 2016-03-03 NOTE — Patient Instructions (Signed)
EGD/Colonoscopy. The risks and benefits such as perforation, bleeding, and infection were reviewed with the patient and is agreeable.  Protonix 40mg  30 minutes before breakfast and 30 minutes before supper.

## 2016-03-03 NOTE — Telephone Encounter (Signed)
Patient needs trilyte 

## 2016-03-03 NOTE — Progress Notes (Signed)
Subjective:    Patient ID: Emily [name], female    DOB: 1951/05/31, 65 y.o.   MRN: [id number]  HPI Referred by Sallee Lange for epigastric pain 3 weeks ago. She had gas. She says she was in severe pain.  She said her intestines were sore.   She was started on Protonix and she said this has helped. She was also on Meloxicam which has been on hold x 3 weeks.  She says she feels a little better.  Her appetite has been good. No weight loss. She does have epigastric pain. She says if she eats sweets, it will irritate her stomach. Also any foods with acid in it will irritate her. She has a BM x 1 day. No melena or BRRB.  Takes Valium about 2-3 times a month.  Tylenol for arthritis.    Review of Systems Past Medical History  Diagnosis Date  . CHF (congestive heart failure) ([place])   . Acid reflux   . Nerve pain     right leg   . Joint pain   . Depression   . Ruptured lumbar disc   . Complication of anesthesia     History of being told she was allergic to something in 1980 no problems since  . Hypertension     Does not see a cardiologist no stress or echo  . Anxiety   . Sleep apnea     Uses CPAP every night setting at 7 mm/Hg  . HNP (herniated nucleus pulposus)   . History of blood transfusion reaction 1978  . Fibromyalgia     Past Surgical History  Procedure Laterality Date  . Cesarean section    . Bladder repair    . Gallbladder surgery    . Wrist ganglion cyst left    . Knee right (torn cart)    . Tubal ligation    . Vein right leg removed    . Rotator cuff repair      right  . Back surgery    . Replacement total knee      left  . Spinal fusion    . Cholecystectomy      gallbladder disease; stayed sick    Allergies  Allergen Reactions  . Codeine Itching  . Oxycodone-Acetaminophen Itching  . Sulfamethoxazole-Trimethoprim Hives  . Celebrex [Celecoxib]   . Hydrocodone-Acetaminophen Itching    Takes with benadryl  . Lyrica [Pregabalin]     When combined with  narcotics will cause SpO2 to drop  . Tramadol Itching    Current Outpatient Prescriptions on File Prior to Visit  Medication Sig Dispense Refill  . acetaminophen (TYLENOL) 650 MG CR tablet Take 650 mg by mouth every 8 (eight) hours as needed for pain.    . citalopram (CELEXA) 20 MG tablet TAKE ONE TABLET BY MOUTH ONCE DAILY 30 tablet 4  . diazepam (VALIUM) 5 MG tablet Take 1-2 tablets (5-10 mg total) by mouth every 6 (six) hours as needed (Muscle spasms). 60 tablet 1  . Fexofenadine HCl (ALLEGRA PO) Take 1 tablet by mouth daily as needed (allergies).     . fluticasone (FLONASE) 50 MCG/ACT nasal spray USE TWO SPRAYS IN EACH NOSTRIL EVERY DAY AS NEEDED 16 g 3  . gemfibrozil (LOPID) 600 MG tablet Take 1 tablet (600 mg total) by mouth 2 (two) times daily before a meal. 60 tablet 5  . losartan (COZAAR) 100 MG tablet TAKE ONE TABLET BY MOUTH ONCE DAILY. 30 tablet 5  . pantoprazole (PROTONIX) 40  MG tablet Take 1 tablet (40 mg total) by mouth daily. 30 tablet 4  . verapamil (CALAN-SR) 180 MG CR tablet Take 1 tablet (180 mg total) by mouth daily. 30 tablet 4   No current facility-administered medications on file prior to visit.        Objective:   Physical Exam Blood pressure 112/60, pulse 80, temperature 98.7 F (37.1 C), height 5\' 2"  (1.575 m), weight 178 lb 14.4 oz (81.149 kg). Alert and oriented. Skin warm and dry. Oral mucosa is moist.   . Sclera anicteric, conjunctivae is pink. Thyroid not enlarged. No cervical lymphadenopathy. Lungs clear. Heart regular rate and rhythm.  Abdomen is soft. Bowel sounds are positive. No hepatomegaly. No abdominal masses felt. Epigastric  tenderness.  No edema to lower extremities.        Assessment & Plan:  GERD. PUD needs to be ruled out.  Am going to increase her Protonix to twice a day. EGD. The risks and benefits such as perforation, bleeding, and infection were reviewed with the patient and is agreeable. Screening colonoscopy.

## 2016-03-10 DIAGNOSIS — Z6832 Body mass index (BMI) 32.0-32.9, adult: Secondary | ICD-10-CM | POA: Diagnosis not present

## 2016-03-10 DIAGNOSIS — M5416 Radiculopathy, lumbar region: Secondary | ICD-10-CM | POA: Diagnosis not present

## 2016-03-22 ENCOUNTER — Ambulatory Visit (INDEPENDENT_AMBULATORY_CARE_PROVIDER_SITE_OTHER): Payer: PPO | Admitting: Family Medicine

## 2016-03-22 ENCOUNTER — Encounter: Payer: Self-pay | Admitting: Family Medicine

## 2016-03-22 VITALS — BP 128/80 | Temp 98.9°F | Ht 62.0 in | Wt 177.8 lb

## 2016-03-22 DIAGNOSIS — B370 Candidal stomatitis: Secondary | ICD-10-CM

## 2016-03-22 DIAGNOSIS — J309 Allergic rhinitis, unspecified: Secondary | ICD-10-CM

## 2016-03-22 DIAGNOSIS — M5416 Radiculopathy, lumbar region: Secondary | ICD-10-CM | POA: Diagnosis not present

## 2016-03-22 MED ORDER — NYSTATIN 100000 UNIT/ML MT SUSP: OROMUCOSAL | Status: DC

## 2016-03-22 NOTE — Progress Notes (Signed)
   Subjective:    Patient ID: Emily [name], female    DOB: 08-04-1951, 65 y.o.   MRN: [id number]  Cough This is a new problem. The current episode started in the past 7 days. Associated symptoms include headaches, nasal congestion, rhinorrhea and a sore throat. Pertinent negatives include no chest pain, ear pain, fever, shortness of breath or wheezing. Treatments tried: flonase, allegra, guaifensin.   Patient states that her back specialist put her on stick steroids for several days relates that she's been having some thrush symptoms ever since then she stopped taking all of her allergy medicine because she didn't know if either one of those were bothering her tongue she denies high fever relates that she's coughing up clear to whitish phlegm   Review of Systems  Constitutional: Negative for fever and activity change.  HENT: Positive for congestion, rhinorrhea and sore throat. Negative for ear pain.   Eyes: Negative for discharge.  Respiratory: Positive for cough. Negative for shortness of breath and wheezing.   Cardiovascular: Negative for chest pain.  Neurological: Positive for headaches.       Objective:   Physical Exam  Constitutional: She appears well-developed.  HENT:  Head: Normocephalic.  Nose: Nose normal.  Mouth/Throat: Oropharynx is clear and moist. No oropharyngeal exudate.  Neck: Neck supple.  Cardiovascular: Normal rate and normal heart sounds.   No murmur heard. Pulmonary/Chest: Effort normal and breath sounds normal. She has no wheezes.  Lymphadenopathy:    She has no cervical adenopathy.  Skin: Skin is warm and dry.  Nursing note and vitals reviewed.         Assessment & Plan:  Viral syndrome possible No sign of bacterial component currently Thrush Nystatin oral solution 4 times a day swish and swallow for 7 days Allergic rhinitis-resume Allegra may use Flonase rinse after using call if any problems

## 2016-03-24 DIAGNOSIS — M5416 Radiculopathy, lumbar region: Secondary | ICD-10-CM | POA: Diagnosis not present

## 2016-03-24 DIAGNOSIS — M5126 Other intervertebral disc displacement, lumbar region: Secondary | ICD-10-CM | POA: Diagnosis not present

## 2016-04-06 DIAGNOSIS — M5416 Radiculopathy, lumbar region: Secondary | ICD-10-CM | POA: Diagnosis not present

## 2016-04-06 DIAGNOSIS — I10 Essential (primary) hypertension: Secondary | ICD-10-CM | POA: Diagnosis not present

## 2016-04-28 ENCOUNTER — Encounter (HOSPITAL_COMMUNITY): Payer: Self-pay

## 2016-04-28 ENCOUNTER — Encounter (HOSPITAL_COMMUNITY)
Admission: RE | Disposition: A | Discharge status: Home / Self Care | Payer: Self-pay | Source: Ambulatory Visit | Attending: Internal Medicine

## 2016-04-28 ENCOUNTER — Ambulatory Visit (HOSPITAL_COMMUNITY)
Admission: RE | Admit: 2016-04-28 | Discharge: 2016-04-28 | Disposition: A | Discharge status: Home / Self Care | Payer: PPO | Source: Ambulatory Visit | Attending: Internal Medicine | Admitting: Internal Medicine

## 2016-04-28 DIAGNOSIS — Z79899 Other long term (current) drug therapy: Secondary | ICD-10-CM | POA: Insufficient documentation

## 2016-04-28 DIAGNOSIS — I110 Hypertensive heart disease with heart failure: Secondary | ICD-10-CM | POA: Diagnosis not present

## 2016-04-28 DIAGNOSIS — K317 Polyp of stomach and duodenum: Secondary | ICD-10-CM | POA: Insufficient documentation

## 2016-04-28 DIAGNOSIS — Z1211 Encounter for screening for malignant neoplasm of colon: Secondary | ICD-10-CM

## 2016-04-28 DIAGNOSIS — F329 Major depressive disorder, single episode, unspecified: Secondary | ICD-10-CM | POA: Insufficient documentation

## 2016-04-28 DIAGNOSIS — K644 Residual hemorrhoidal skin tags: Secondary | ICD-10-CM | POA: Insufficient documentation

## 2016-04-28 DIAGNOSIS — F419 Anxiety disorder, unspecified: Secondary | ICD-10-CM | POA: Insufficient documentation

## 2016-04-28 DIAGNOSIS — R1013 Epigastric pain: Secondary | ICD-10-CM | POA: Insufficient documentation

## 2016-04-28 DIAGNOSIS — K219 Gastro-esophageal reflux disease without esophagitis: Secondary | ICD-10-CM | POA: Diagnosis not present

## 2016-04-28 DIAGNOSIS — D123 Benign neoplasm of transverse colon: Secondary | ICD-10-CM | POA: Diagnosis not present

## 2016-04-28 DIAGNOSIS — K228 Other specified diseases of esophagus: Secondary | ICD-10-CM | POA: Diagnosis not present

## 2016-04-28 DIAGNOSIS — D122 Benign neoplasm of ascending colon: Secondary | ICD-10-CM | POA: Insufficient documentation

## 2016-04-28 DIAGNOSIS — I509 Heart failure, unspecified: Secondary | ICD-10-CM | POA: Insufficient documentation

## 2016-04-28 DIAGNOSIS — G4730 Sleep apnea, unspecified: Secondary | ICD-10-CM | POA: Insufficient documentation

## 2016-04-28 DIAGNOSIS — M797 Fibromyalgia: Secondary | ICD-10-CM | POA: Diagnosis not present

## 2016-04-28 DIAGNOSIS — K449 Diaphragmatic hernia without obstruction or gangrene: Secondary | ICD-10-CM | POA: Insufficient documentation

## 2016-04-28 HISTORY — PX: COLONOSCOPY: SHX5424

## 2016-04-28 HISTORY — PX: ESOPHAGOGASTRODUODENOSCOPY: SHX5428

## 2016-04-28 SURGERY — EGD (ESOPHAGOGASTRODUODENOSCOPY)
Anesthesia: Moderate Sedation

## 2016-04-28 MED ORDER — MEPERIDINE HCL 50 MG/ML IJ SOLN
INTRAMUSCULAR | Status: DC | PRN
  Administered 2016-04-28 (×2): 25 mg via INTRAVENOUS

## 2016-04-28 MED ORDER — BUTAMBEN-TETRACAINE-BENZOCAINE 2-2-14 % EX AERO
INHALATION_SPRAY | CUTANEOUS | Status: DC | PRN
  Administered 2016-04-28: 2 via TOPICAL

## 2016-04-28 MED ORDER — BUTAMBEN-TETRACAINE-BENZOCAINE 2-2-14 % EX AERO
INHALATION_SPRAY | CUTANEOUS | Status: AC
  Filled 2016-04-28: qty 20

## 2016-04-28 MED ORDER — MIDAZOLAM HCL 5 MG/5ML IJ SOLN
INTRAMUSCULAR | Status: DC | PRN
  Administered 2016-04-28 (×6): 2 mg via INTRAVENOUS

## 2016-04-28 MED ORDER — MIDAZOLAM HCL 5 MG/5ML IJ SOLN
INTRAMUSCULAR | Status: DC
Start: 2016-04-28 — End: 2016-04-28
  Filled 2016-04-28: qty 10

## 2016-04-28 MED ORDER — MEPERIDINE HCL 50 MG/ML IJ SOLN
INTRAMUSCULAR | Status: AC
  Filled 2016-04-28: qty 1

## 2016-04-28 MED ORDER — STERILE WATER FOR IRRIGATION IR SOLN
Status: DC | PRN
  Administered 2016-04-28: 2.5 mL

## 2016-04-28 MED ORDER — SODIUM CHLORIDE 0.9 % IV SOLN
INTRAVENOUS | Status: DC
  Administered 2016-04-28: 11:00:00 via INTRAVENOUS

## 2016-04-28 MED ORDER — MIDAZOLAM HCL 5 MG/5ML IJ SOLN
INTRAMUSCULAR | Status: AC
  Filled 2016-04-28: qty 5

## 2016-04-28 NOTE — Op Note (Signed)
[facility] Patient Name: Emily [name] Procedure Date: 04/28/2016 11:31 AM MRN: [id number] Date of Birth: [birth year] Attending MD: Hildred Laser , MD CSN: [id number] Age: 65 Admit Type: Outpatient Procedure:                Upper GI endoscopy Indications:              Epigastric abdominal pain Providers:                Hildred Laser, MD, Lurline Del, RN, Isabella Stalling,                            Technician Referring MD:             Elayne Snare. Wolfgang Phoenix, MD Medicines:                Cetacaine spray, Meperidine 50 mg IV, Midazolam 10                            mg IV Complications:            No immediate complications. Estimated Blood Loss:     Estimated blood loss was minimal. Procedure:                Pre-Anesthesia Assessment:                           - Prior to the procedure, a History and Physical                            was performed, and patient medications and                            allergies were reviewed. The patient's tolerance of                            previous anesthesia was also reviewed. The risks                            and benefits of the procedure and the sedation                            options and risks were discussed with the patient.                            All questions were answered, and informed consent                            was obtained. Prior Anticoagulants: The patient has                            taken no previous anticoagulant or antiplatelet                            agents. ASA Grade Assessment: III - A patient with  severe systemic disease. After reviewing the risks                            and benefits, the patient was deemed in                            satisfactory condition to undergo the procedure.                           After obtaining informed consent, the endoscope was                            passed under direct vision. Throughout the                            procedure, the  patient's blood pressure, pulse, and                            oxygen saturations were monitored continuously. The                            [id number]) scope was introduced through the                            mouth, and advanced to the second part of duodenum.                            The upper GI endoscopy was accomplished without                            difficulty. The patient tolerated the procedure                            well. Scope In: 11:51:15 AM Scope Out: 12:01:39 PM Total Procedure Duration: 0 hours 10 minutes 24 seconds  Findings:      The examined esophagus was normal.      The Z-line was irregular and was found 37 cm from the incisors.      A 2 cm hiatal hernia was present.      A single 4 mm sessile polyp with no bleeding and no stigmata of recent       bleeding was found in the gastric body. The polyp was removed with a       cold snare. Resection and retrieval were complete.      The exam of the stomach was otherwise normal.      The duodenal bulb and second portion of the duodenum were normal. Impression:               - Normal esophagus.                           - Z-line irregular, 37 cm from the incisors.                           - 2 cm hiatal hernia.                           -  A single gastric polyp. Resected and retrieved.                           - Normal duodenal bulb and second portion of the                            duodenum. Moderate Sedation:      Moderate (conscious) sedation was administered by the endoscopy nurse       and supervised by the endoscopist. The following parameters were       monitored: oxygen saturation, heart rate, blood pressure, CO2       capnography and response to care. Total physician intraservice time was       18 minutes. Recommendation:           - Patient has a contact number available for                            emergencies. The signs and symptoms of potential                            delayed  complications were discussed with the                            patient. Return to normal activities tomorrow.                            Written discharge instructions were provided to the                            patient.                           - Patient has a contact number available for                            emergencies. The signs and symptoms of potential                            delayed complications were discussed with the                            patient. Return to normal activities tomorrow.                            Written discharge instructions were provided to the                            patient.                           - Resume previous diet today.                           - Continue present medications.                           -  Await pathology results. Procedure Code(s):        --- Professional ---                           [phone], Esophagogastroduodenoscopy, flexible,                            transoral; with removal of tumor(s), polyp(s), or                            other lesion(s) by snare technique                           99152, Moderate sedation services provided by the                            same physician or other qualified health care                            professional performing the diagnostic or                            therapeutic service that the sedation supports,                            requiring the presence of an independent trained                            observer to assist in the monitoring of the                            patient's level of consciousness and physiological                            status; initial 15 minutes of intraservice time,                            patient age 55 years or older Diagnosis Code(s):        --- Professional ---                           K22.8, Other specified diseases of esophagus                           K44.9, Diaphragmatic hernia without obstruction or                             gangrene                           K31.7, Polyp of stomach and duodenum                           R10.13, Epigastric pain CPT copyright 2016 [employer]. All rights reserved. The codes documented in this report are preliminary and upon coder review may  be revised to meet current compliance requirements. Hildred Laser, MD Hildred Laser, MD 04/28/2016 12:47:29 PM This report has been signed electronically. Number of Addenda: 0

## 2016-04-28 NOTE — H&P (Signed)
Emily [name] is an 65 y.o. female.   Chief Complaint: Patient is here for EGD and colonoscopy. HPI: Patient is 65 year old Caucasian female with multiple medical problems who presents with several week history of epigastric pain associated with nausea and early with meals. There is no history of melena or rectal bleeding. She was on Mobic which was discontinued. She is feeling better. She denies dysphagia with complains of pyrosis. She is status post cholecystectomy 7 years ago. Recent CBC lipase and LFTs were normal. She's had GERD symptoms for 20 years and heartburns well controlled with therapy. She is also undergoing screening colonoscopy. This is patient's first exam. Family history is negative for CRC.   Past Medical History  Diagnosis Date  . CHF (congestive heart failure) ([place])   . Acid reflux   . Nerve pain     right leg   . Joint pain   . Depression   . Ruptured lumbar disc   . Complication of anesthesia     History of being told she was allergic to something in 1980 no problems since  . Hypertension     Does not see a cardiologist no stress or echo  . Anxiety   . Sleep apnea     Uses CPAP every night setting at 7 mm/Hg  . HNP (herniated nucleus pulposus)   . History of blood transfusion reaction 1978  . Fibromyalgia     Past Surgical History  Procedure Laterality Date  . Cesarean section    . Bladder repair    . Gallbladder surgery    . Wrist ganglion cyst left    . Knee right (torn cart)    . Tubal ligation    . Vein right leg removed    . Rotator cuff repair      right  . Back surgery    . Replacement total knee      left  . Spinal fusion    . Cholecystectomy      gallbladder disease; stayed sick    Family History  Problem Relation Age of Onset  . Cancer      family history   . Diabetes      family history   . Coronary artery disease      family history   . Arthritis      family history   . Asthma    . Hypertension Father   . Cancer Father      liposarcoma  . Heart disease Mother    Social History:  reports that she has never smoked. She has never used smokeless tobacco. She reports that she does not drink alcohol or use illicit drugs.  Allergies:  Allergies  Allergen Reactions  . Codeine Itching  . Oxycodone-Acetaminophen Itching  . Sulfamethoxazole-Trimethoprim Hives  . Celebrex [Celecoxib] Other (See Comments)    Homicidal thoughts.  . Hydrocodone-Acetaminophen Itching    Takes with benadryl  . Lyrica [Pregabalin]     When combined with narcotics will cause SpO2 to drop  . Tramadol Itching    Medications Prior to Admission  Medication Sig Dispense Refill  . acetaminophen (TYLENOL) 500 MG tablet Take 500 mg by mouth 2 (two) times daily as needed.    . citalopram (CELEXA) 20 MG tablet TAKE ONE TABLET BY MOUTH ONCE DAILY 30 tablet 4  . diazepam (VALIUM) 5 MG tablet Take 1-2 tablets (5-10 mg total) by mouth every 6 (six) hours as needed (Muscle spasms). 60 tablet 1  . Fexofenadine HCl (ALLEGRA  PO) Take 1 tablet by mouth daily as needed (allergies).     Marland Kitchen gemfibrozil (LOPID) 600 MG tablet Take 1 tablet (600 mg total) by mouth 2 (two) times daily before a meal. 60 tablet 5  . losartan (COZAAR) 100 MG tablet TAKE ONE TABLET BY MOUTH ONCE DAILY. 30 tablet 5  . pantoprazole (PROTONIX) 40 MG tablet Take 1 tablet (40 mg total) by mouth 2 (two) times daily before a meal. 60 tablet 4  . polyethylene glycol-electrolytes (NULYTELY/GOLYTELY) 420 g solution Take 4,000 mLs by mouth once. 4000 mL 0  . verapamil (CALAN-SR) 180 MG CR tablet Take 1 tablet (180 mg total) by mouth daily. 30 tablet 4  . fluticasone (FLONASE) 50 MCG/ACT nasal spray USE TWO SPRAYS IN EACH NOSTRIL EVERY DAY AS NEEDED (Patient not taking: Reported on 04/16/2016) 16 g 3  . nystatin (MYCOSTATIN) 100000 UNIT/ML suspension 1 tsp swish and swallow qid for 10 days (Patient not taking: Reported on 04/16/2016) 200 mL 1    No results found for this or any previous visit  (from the past 48 hour(s)). No results found.  ROS  Blood pressure 144/79, pulse 60, temperature 98.7 F (37.1 C), temperature source Oral, resp. rate 12, height 5\' 2"  (1.575 m), weight 170 lb (77.111 kg), SpO2 98 %. Physical Exam  Constitutional: She appears well-developed and well-nourished.  HENT:  Mouth/Throat: Oropharynx is clear and moist.  Eyes: Conjunctivae are normal. No scleral icterus.  Neck: No thyromegaly present.  Cardiovascular: Normal rate, regular rhythm and normal heart sounds.   No murmur heard. Respiratory: Effort normal and breath sounds normal.  GI: Soft. She exhibits no distension and no mass. There is no tenderness.  Musculoskeletal: She exhibits no edema.  Lymphadenopathy:    She has no cervical adenopathy.  Neurological: She is alert.  Skin: Skin is warm and dry.     Assessment/Plan Recurrent epigastric pain. Chronic GERD. Diagnostic EGD and average risk screening colonoscopy.  [name], MD 04/28/2016, 11:37 AM

## 2016-04-28 NOTE — Discharge Instructions (Signed)
Resume usual medications and diet. No driving for 24 hours. Physician will call with biopsy results.  Gastrointestinal Endoscopy, Care After Refer to this sheet in the next few weeks. These instructions provide you with information on caring for yourself after your procedure. Your caregiver may also give you more specific instructions. Your treatment has been planned according to current medical practices, but problems sometimes occur. Call your caregiver if you have any problems or questions after your procedure. HOME CARE INSTRUCTIONS  If you were given medicine to help you relax (sedative), do not drive, operate machinery, or sign important documents for 24 hours.  Avoid alcohol and hot or warm beverages for the first 24 hours after the procedure.  Only take over-the-counter or prescription medicines for pain, discomfort, or fever as directed by your caregiver. You may resume taking your normal medicines unless your caregiver tells you otherwise. Ask your caregiver when you may resume taking medicines that may cause bleeding, such as aspirin, clopidogrel, or warfarin.  You may return to your normal diet and activities on the day after your procedure, or as directed by your caregiver. Walking may help to reduce any bloated feeling in your abdomen.  Drink enough fluids to keep your urine clear or pale yellow.  You may gargle with salt water if you have a sore throat. SEEK IMMEDIATE MEDICAL CARE IF:  You have severe nausea or vomiting.  You have severe abdominal pain, abdominal cramps that last longer than 6 hours, or abdominal swelling (distention).  You have severe shoulder or back pain.  You have trouble swallowing.  You have shortness of breath, your breathing is shallow, or you are breathing faster than normal.  You have a fever or a rapid heartbeat.  You vomit blood or material that looks like coffee grounds.  You have bloody, black, or tarry stools. MAKE SURE  YOU:  Understand these instructions.  Will watch your condition.  Will get help right away if you are not doing well or get worse.   This information is not intended to replace advice given to you by your health care provider. Make sure you discuss any questions you have with your health care provider.   Document Released: 07/06/2004 Document Revised: 12/13/2014 Document Reviewed: 02/22/2012 Elsevier Interactive Patient Education 2016 [employer].  Hemorrhoids Hemorrhoids are swollen veins around the rectum or anus. There are two types of hemorrhoids:   Internal hemorrhoids. These occur in the veins just inside the rectum. They may poke through to the outside and become irritated and painful.  External hemorrhoids. These occur in the veins outside the anus and can be felt as a painful swelling or hard lump near the anus. CAUSES  Pregnancy.   Obesity.   Constipation or diarrhea.   Straining to have a bowel movement.   Sitting for long periods on the toilet.  Heavy lifting or other activity that caused you to strain.  Anal intercourse. SYMPTOMS   Pain.   Anal itching or irritation.   Rectal bleeding.   Fecal leakage.   Anal swelling.   One or more lumps around the anus.  DIAGNOSIS  Your caregiver may be able to diagnose hemorrhoids by visual examination. Other examinations or tests that may be performed include:   Examination of the rectal area with a gloved hand (digital rectal exam).   Examination of anal canal using a small tube (scope).   A blood test if you have lost a significant amount of blood.  A test to look  inside the colon (sigmoidoscopy or colonoscopy). TREATMENT Most hemorrhoids can be treated at home. However, if symptoms do not seem to be getting better or if you have a lot of rectal bleeding, your caregiver may perform a procedure to help make the hemorrhoids get smaller or remove them completely. Possible treatments include:    Placing a rubber band at the base of the hemorrhoid to cut off the circulation (rubber band ligation).   Injecting a chemical to shrink the hemorrhoid (sclerotherapy).   Using a tool to burn the hemorrhoid (infrared light therapy).   Surgically removing the hemorrhoid (hemorrhoidectomy).   Stapling the hemorrhoid to block blood flow to the tissue (hemorrhoid stapling).  HOME CARE INSTRUCTIONS   Eat foods with fiber, such as whole grains, beans, nuts, fruits, and vegetables. Ask your doctor about taking products with added fiber in them (fibersupplements).  Increase fluid intake. Drink enough water and fluids to keep your urine clear or pale yellow.   Exercise regularly.   Go to the bathroom when you have the urge to have a bowel movement. Do not wait.   Avoid straining to have bowel movements.   Keep the anal area dry and clean. Use wet toilet paper or moist towelettes after a bowel movement.   Medicated creams and suppositories may be used or applied as directed.   Only take over-the-counter or prescription medicines as directed by your caregiver.   Take warm sitz baths for 15-20 minutes, 3-4 times a day to ease pain and discomfort.   Place ice packs on the hemorrhoids if they are tender and swollen. Using ice packs between sitz baths may be helpful.   Put ice in a plastic bag.   Place a towel between your skin and the bag.   Leave the ice on for 15-20 minutes, 3-4 times a day.   Do not use a donut-shaped pillow or sit on the toilet for long periods. This increases blood pooling and pain.  SEEK MEDICAL CARE IF:  You have increasing pain and swelling that is not controlled by treatment or medicine.  You have uncontrolled bleeding.  You have difficulty or you are unable to have a bowel movement.  You have pain or inflammation outside the area of the hemorrhoids. MAKE SURE YOU:  Understand these instructions.  Will watch your condition.  Will  get help right away if you are not doing well or get worse.   This information is not intended to replace advice given to you by your health care provider. Make sure you discuss any questions you have with your health care provider.   Document Released: 11/19/2000 Document Revised: 11/08/2012 Document Reviewed: 09/26/2012 Elsevier Interactive Patient Education [employer].

## 2016-04-28 NOTE — Op Note (Signed)
[facility] Patient Name: Emily [name] Procedure Date: 04/28/2016 12:04 PM MRN: [id number] Date of Birth: Mar 23, 1951 Attending MD: Hildred Laser , MD CSN: [id number] Age: 65 Admit Type: Outpatient Procedure:                Colonoscopy Indications:              Screening for colorectal malignant neoplasm Providers:                Hildred Laser, MD, Lurline Del, RN, Isabella Stalling,                            Technician Referring MD:             Elayne Snare. Luking Medicines:                Midazolam 2 mg IV Complications:            No immediate complications. Estimated Blood Loss:     Estimated blood loss was minimal. Procedure:                Pre-Anesthesia Assessment:                           - Prior to the procedure, a History and Physical                            was performed, and patient medications and                            allergies were reviewed. The patient's tolerance of                            previous anesthesia was also reviewed. The risks                            and benefits of the procedure and the sedation                            options and risks were discussed with the patient.                            All questions were answered, and informed consent                            was obtained. Prior Anticoagulants: The patient has                            taken no previous anticoagulant or antiplatelet                            agents. ASA Grade Assessment: III - A patient with                            severe systemic disease. After reviewing the risks  and benefits, the patient was deemed in                            satisfactory condition to undergo the procedure.                           After obtaining informed consent, the colonoscope                            was passed under direct vision. Throughout the                            procedure, the patient's blood pressure, pulse, and                             oxygen saturations were monitored continuously. The                            [id number]) was introduced through the                            anus and advanced to the the cecum, identified by                            appendiceal orifice and ileocecal valve. The                            colonoscopy was performed without difficulty. The                            patient tolerated the procedure well. The quality                            of the bowel preparation was adequate. The                            ileocecal valve, appendiceal orifice, and rectum                            were photographed. Scope In: 12:10:13 PM Scope Out: 12:38:30 PM Scope Withdrawal Time: 0 hours 7 minutes 44 seconds  Total Procedure Duration: 0 hours 28 minutes 17 seconds  Findings:      Two sessile polyps were found in the splenic flexure and ascending       colon. The polyps were small in size. These were biopsied with a cold       forceps for histology.      The exam was otherwise normal throughout the examined colon.      External hemorrhoids were found during retroflexion. The hemorrhoids       were small. Impression:               - Two small polyps at the splenic flexure and in                            the  ascending colon. Biopsied.                           - External hemorrhoids. Moderate Sedation:      Moderate (conscious) sedation was administered by the endoscopy nurse       and supervised by the endoscopist. The following parameters were       monitored: oxygen saturation, heart rate, blood pressure, CO2       capnography and response to care. Total physician intraservice time was       38 minutes. Recommendation:           - Patient has a contact number available for                            emergencies. The signs and symptoms of potential                            delayed complications were discussed with the                            patient. Return to normal activities  tomorrow.                            Written discharge instructions were provided to the                            patient.                           - Resume previous diet today.                           - Continue present medications.                           - Await pathology results.                           - Repeat colonoscopy for surveillance based on                            pathology results. Procedure Code(s):        --- Professional ---                           [phone], Colonoscopy, flexible; with biopsy, single                            or multiple                           [id number], Moderate sedation services provided by the                            same physician or other qualified health care  professional performing the diagnostic or                            therapeutic service that the sedation supports,                            requiring the presence of an independent trained                            observer to assist in the monitoring of the                            patient's level of consciousness and physiological                            status; initial 15 minutes of intraservice time,                            patient age 42 years or older                           [phone], Moderate sedation services; each additional                            15 minutes intraservice time                           [phone], Moderate sedation services; each additional                            15 minutes intraservice time Diagnosis Code(s):        --- Professional ---                           Z12.11, Encounter for screening for malignant                            neoplasm of colon                           D12.3, Benign neoplasm of transverse colon (hepatic                            flexure or splenic flexure)                           D12.2, Benign neoplasm of ascending colon                           K64.4, Residual hemorrhoidal skin  tags CPT copyright 2016 American Medical Association. All rights reserved. The codes documented in this report are preliminary and upon coder review may  be revised to meet current compliance requirements. Hildred Laser, MD Hildred Laser, MD 04/28/2016 12:51:04 PM This report has been signed electronically. Number of Addenda: 0

## 2016-04-30 ENCOUNTER — Encounter (HOSPITAL_COMMUNITY): Payer: Self-pay | Admitting: Internal Medicine

## 2016-05-04 DIAGNOSIS — M5416 Radiculopathy, lumbar region: Secondary | ICD-10-CM | POA: Diagnosis not present

## 2016-05-20 ENCOUNTER — Other Ambulatory Visit: Payer: Self-pay | Admitting: Neurosurgery

## 2016-05-20 DIAGNOSIS — I10 Essential (primary) hypertension: Secondary | ICD-10-CM | POA: Diagnosis not present

## 2016-05-20 DIAGNOSIS — Z6832 Body mass index (BMI) 32.0-32.9, adult: Secondary | ICD-10-CM | POA: Diagnosis not present

## 2016-05-20 DIAGNOSIS — M4806 Spinal stenosis, lumbar region: Secondary | ICD-10-CM | POA: Diagnosis not present

## 2016-06-01 ENCOUNTER — Encounter (HOSPITAL_COMMUNITY)
Admission: RE | Admit: 2016-06-01 | Discharge: 2016-06-01 | Disposition: A | Discharge status: Home / Self Care | Payer: PPO | Source: Ambulatory Visit | Attending: Neurosurgery | Admitting: Neurosurgery

## 2016-06-01 ENCOUNTER — Other Ambulatory Visit (HOSPITAL_COMMUNITY): Payer: PPO

## 2016-06-01 ENCOUNTER — Encounter (HOSPITAL_COMMUNITY): Payer: Self-pay

## 2016-06-01 DIAGNOSIS — M5136 Other intervertebral disc degeneration, lumbar region: Secondary | ICD-10-CM | POA: Diagnosis not present

## 2016-06-01 DIAGNOSIS — M545 Low back pain: Secondary | ICD-10-CM | POA: Diagnosis not present

## 2016-06-01 DIAGNOSIS — K219 Gastro-esophageal reflux disease without esophagitis: Secondary | ICD-10-CM | POA: Diagnosis not present

## 2016-06-01 DIAGNOSIS — M5116 Intervertebral disc disorders with radiculopathy, lumbar region: Secondary | ICD-10-CM | POA: Diagnosis not present

## 2016-06-01 DIAGNOSIS — I110 Hypertensive heart disease with heart failure: Secondary | ICD-10-CM | POA: Diagnosis not present

## 2016-06-01 DIAGNOSIS — M4806 Spinal stenosis, lumbar region: Secondary | ICD-10-CM | POA: Diagnosis not present

## 2016-06-01 DIAGNOSIS — Z981 Arthrodesis status: Secondary | ICD-10-CM | POA: Diagnosis not present

## 2016-06-01 DIAGNOSIS — Z96652 Presence of left artificial knee joint: Secondary | ICD-10-CM | POA: Diagnosis not present

## 2016-06-01 DIAGNOSIS — M797 Fibromyalgia: Secondary | ICD-10-CM | POA: Diagnosis not present

## 2016-06-01 DIAGNOSIS — G4730 Sleep apnea, unspecified: Secondary | ICD-10-CM | POA: Diagnosis not present

## 2016-06-01 DIAGNOSIS — F329 Major depressive disorder, single episode, unspecified: Secondary | ICD-10-CM | POA: Diagnosis not present

## 2016-06-01 DIAGNOSIS — F419 Anxiety disorder, unspecified: Secondary | ICD-10-CM | POA: Diagnosis not present

## 2016-06-01 DIAGNOSIS — Z79899 Other long term (current) drug therapy: Secondary | ICD-10-CM | POA: Diagnosis not present

## 2016-06-01 DIAGNOSIS — I509 Heart failure, unspecified: Secondary | ICD-10-CM | POA: Diagnosis not present

## 2016-06-01 HISTORY — DX: Personal history of other diseases of the digestive system: Z87.19

## 2016-06-01 LAB — TYPE AND SCREEN
ABO/RH(D): A POS
Antibody Screen: NEGATIVE

## 2016-06-01 LAB — CBC WITH DIFFERENTIAL/PLATELET
Basophils Absolute: 0 10*3/uL (ref 0.0–0.1)
Basophils Relative: 0 %
EOS ABS: 0.2 10*3/uL (ref 0.0–0.7)
EOS PCT: 4 %
HCT: 39.1 % (ref 36.0–46.0)
HEMOGLOBIN: 13.2 g/dL (ref 12.0–15.0)
LYMPHS ABS: 1.6 10*3/uL (ref 0.7–4.0)
Lymphocytes Relative: 27 %
MCH: 31.4 pg (ref 26.0–34.0)
MCHC: 33.8 g/dL (ref 30.0–36.0)
MCV: 92.9 fL (ref 78.0–100.0)
MONOS PCT: 7 %
Monocytes Absolute: 0.4 10*3/uL (ref 0.1–1.0)
Neutro Abs: 3.8 10*3/uL (ref 1.7–7.7)
Neutrophils Relative %: 62 %
PLATELETS: 262 10*3/uL (ref 150–400)
RBC: 4.21 MIL/uL (ref 3.87–5.11)
RDW: 12.9 % (ref 11.5–15.5)
WBC: 6 10*3/uL (ref 4.0–10.5)

## 2016-06-01 LAB — BASIC METABOLIC PANEL
Anion gap: 8 (ref 5–15)
BUN: 9 mg/dL (ref 6–20)
CHLORIDE: 106 mmol/L (ref 101–111)
CO2: 25 mmol/L (ref 22–32)
Calcium: 9.5 mg/dL (ref 8.9–10.3)
Creatinine, Ser: 0.83 mg/dL (ref 0.44–1.00)
GFR calc Af Amer: >60 mL/min (ref >60)
GFR calc non Af Amer: >60 mL/min (ref >60)
Glucose, Bld: 125 mg/dL — ABNORMAL HIGH (ref 65–99)
POTASSIUM: 3.5 mmol/L (ref 3.5–5.1)
SODIUM: 139 mmol/L (ref 135–145)

## 2016-06-01 LAB — SURGICAL PCR SCREEN
MRSA, PCR: NEGATIVE
Staphylococcus aureus: NEGATIVE

## 2016-06-01 NOTE — Pre-Procedure Instructions (Addendum)
ADANNA MAJOROS  06/01/2016      [facility] [address], [place] - [address] [place] [address] Phone: [phone] Fax: [phone]  [facility] [address] [place] - [id number] [place] [address] Phone: [phone] Fax: [phone]    Your procedure is scheduled on 06/04/16  Report to [facility] Admitting at 730 A.M.  Call this number if you have problems the morning of surgery:  [phone]   Remember:  Do not eat food or drink liquids after midnight.  Take these medicines the morning of surgery with A SIP OF WATER celexa, protonix,verapamil,valium if needed  STOP all herbel meds, nsaids (aleve,naproxen,advil,ibuprofen) starting NOW including vitamins, aspirin   Do not wear jewelry, make-up or nail polish.  Do not wear lotions, powders, or perfumes.  You may wear deoderant.  Do not shave 48 hours prior to surgery.  Men may shave face and neck.  Do not bring valuables to the hospital.  [facility] is not responsible for any belongings or valuables.  Contacts, dentures or bridgework may not be worn into surgery.  Leave your suitcase in the car.  After surgery it may be brought to your room.  For patients admitted to the hospital, discharge time will be determined by your treatment team.  Patients discharged the day of surgery will not be allowed to drive home.   Name and phone number of your driver:   Special instructions:   Special Instructions: [facility] - Preparing for Surgery  Before surgery, you can play an important role.  Because skin is not sterile, your skin needs to be as free of germs as possible.  You can reduce the number of germs on you skin by washing with CHG (chlorahexidine gluconate) soap before surgery.  CHG is an antiseptic cleaner which kills germs and bonds with the skin to continue killing germs even after washing.  Please DO NOT use if you have an allergy to CHG or  antibacterial soaps.  If your skin becomes reddened/irritated stop using the CHG and inform your nurse when you arrive at Short Stay.  Do not shave (including legs and underarms) for at least 48 hours prior to the first CHG shower.  You may shave your face.  Please follow these instructions carefully:   1.  Shower with CHG Soap the night before surgery and the morning of Surgery.  2.  If you choose to wash your hair, wash your hair first as usual with your normal shampoo.  3.  After you shampoo, rinse your hair and body thoroughly to remove the Shampoo.  4.  Use CHG as you would any other liquid soap.  You can apply chg directly  to the skin and wash gently with scrungie or a clean washcloth.  5.  Apply the CHG Soap to your body ONLY FROM THE NECK DOWN.  Do not use on open wounds or open sores.  Avoid contact with your eyes ears, mouth and genitals (private parts).  Wash genitals (private parts)       with your normal soap.  6.  Wash thoroughly, paying special attention to the area where your surgery will be performed.  7.  Thoroughly rinse your body with warm water from the neck down.  8.  DO NOT shower/wash with your normal soap after using and rinsing off the CHG Soap.  9.  Pat yourself dry with a clean  towel.            10.  Wear clean pajamas.            11.  Place clean sheets on your bed the night of your first shower and do not sleep with pets.  Day of Surgery  Do not apply any lotions/deodorants the morning of surgery.  Please wear clean clothes to the hospital/surgery center.  Please read over the following fact sheets that you were given. Pain Booklet, Coughing and Deep Breathing, MRSA Information and Surgical Site Infection Prevention

## 2016-06-02 NOTE — Progress Notes (Signed)
Anesthesia Chart Review:  Pt is a 65 year old female scheduled for L2-3 PLIF on 06/04/2016 with Earnie Larsson, MD.   PMH includes:  HTN, CHF (in the 1990's), OSA, GERD. Never smoker. BMI 32.5. S/p PLIF 04/10/13.   Medications include: gemfibrozil, losartan, protonix, verapamil  Preoperative labs reviewed.    EKG 06/01/16: NSR.   If no changes, I anticipate pt can proceed with surgery as scheduled.   Willeen Cass, FNP-BC [facility]/Anesthesiology Phone: [phone] 06/02/2016 2:29 PM

## 2016-06-03 MED ORDER — CEFAZOLIN SODIUM-DEXTROSE 2-4 GM/100ML-% IV SOLN
2.0000 g | INTRAVENOUS | Status: AC
  Administered 2016-06-04: 2 g via INTRAVENOUS
  Filled 2016-06-03: qty 100

## 2016-06-03 MED ORDER — DEXAMETHASONE SODIUM PHOSPHATE 10 MG/ML IJ SOLN: 10.0000 mg | INTRAMUSCULAR | Status: DC

## 2016-06-04 ENCOUNTER — Inpatient Hospital Stay (HOSPITAL_COMMUNITY): Payer: PPO | Admitting: Anesthesiology

## 2016-06-04 ENCOUNTER — Inpatient Hospital Stay (HOSPITAL_COMMUNITY): Payer: PPO

## 2016-06-04 ENCOUNTER — Inpatient Hospital Stay (HOSPITAL_COMMUNITY): Payer: PPO | Admitting: Emergency Medicine

## 2016-06-04 ENCOUNTER — Inpatient Hospital Stay (HOSPITAL_COMMUNITY)
Admission: RE | Admit: 2016-06-04 | Discharge: 2016-06-06 | DRG: 460 | Disposition: A | Discharge status: Home / Self Care | Payer: PPO | Source: Ambulatory Visit | Attending: Neurosurgery | Admitting: Neurosurgery

## 2016-06-04 ENCOUNTER — Encounter (HOSPITAL_COMMUNITY)
Admission: RE | Disposition: A | Discharge status: Home / Self Care | Payer: Self-pay | Source: Ambulatory Visit | Attending: Neurosurgery

## 2016-06-04 ENCOUNTER — Encounter (HOSPITAL_COMMUNITY): Payer: Self-pay | Admitting: Urology

## 2016-06-04 DIAGNOSIS — M5116 Intervertebral disc disorders with radiculopathy, lumbar region: Principal | ICD-10-CM | POA: Diagnosis present

## 2016-06-04 DIAGNOSIS — M797 Fibromyalgia: Secondary | ICD-10-CM | POA: Diagnosis present

## 2016-06-04 DIAGNOSIS — K219 Gastro-esophageal reflux disease without esophagitis: Secondary | ICD-10-CM | POA: Diagnosis not present

## 2016-06-04 DIAGNOSIS — F329 Major depressive disorder, single episode, unspecified: Secondary | ICD-10-CM | POA: Diagnosis not present

## 2016-06-04 DIAGNOSIS — G4730 Sleep apnea, unspecified: Secondary | ICD-10-CM | POA: Diagnosis not present

## 2016-06-04 DIAGNOSIS — M4806 Spinal stenosis, lumbar region: Secondary | ICD-10-CM | POA: Diagnosis not present

## 2016-06-04 DIAGNOSIS — Z79899 Other long term (current) drug therapy: Secondary | ICD-10-CM

## 2016-06-04 DIAGNOSIS — M5136 Other intervertebral disc degeneration, lumbar region: Secondary | ICD-10-CM | POA: Diagnosis not present

## 2016-06-04 DIAGNOSIS — I509 Heart failure, unspecified: Secondary | ICD-10-CM | POA: Diagnosis not present

## 2016-06-04 DIAGNOSIS — Z419 Encounter for procedure for purposes other than remedying health state, unspecified: Secondary | ICD-10-CM

## 2016-06-04 DIAGNOSIS — F419 Anxiety disorder, unspecified: Secondary | ICD-10-CM | POA: Diagnosis present

## 2016-06-04 DIAGNOSIS — M48061 Spinal stenosis, lumbar region without neurogenic claudication: Secondary | ICD-10-CM | POA: Diagnosis present

## 2016-06-04 DIAGNOSIS — Z981 Arthrodesis status: Secondary | ICD-10-CM

## 2016-06-04 DIAGNOSIS — Z96652 Presence of left artificial knee joint: Secondary | ICD-10-CM | POA: Diagnosis present

## 2016-06-04 DIAGNOSIS — I110 Hypertensive heart disease with heart failure: Secondary | ICD-10-CM | POA: Diagnosis not present

## 2016-06-04 DIAGNOSIS — M545 Low back pain: Secondary | ICD-10-CM | POA: Diagnosis not present

## 2016-06-04 SURGERY — POSTERIOR LUMBAR FUSION 1 LEVEL
Anesthesia: General | Site: Back

## 2016-06-04 MED ORDER — SODIUM CHLORIDE 0.9 % IR SOLN
Status: DC | PRN
  Administered 2016-06-04: 14:00:00

## 2016-06-04 MED ORDER — SODIUM CHLORIDE 0.9% FLUSH: 3.0000 mL | INTRAVENOUS | Status: DC | PRN

## 2016-06-04 MED ORDER — PROPOFOL 10 MG/ML IV BOLUS
INTRAVENOUS | Status: AC
  Filled 2016-06-04: qty 20

## 2016-06-04 MED ORDER — LOSARTAN POTASSIUM 50 MG PO TABS
100.0000 mg | ORAL_TABLET | Freq: Every day | ORAL | Status: DC
  Administered 2016-06-05: 100 mg via ORAL
  Filled 2016-06-04 (×2): qty 2

## 2016-06-04 MED ORDER — GEMFIBROZIL 600 MG PO TABS
600.0000 mg | ORAL_TABLET | Freq: Two times a day (BID) | ORAL | Status: DC
  Administered 2016-06-05 – 2016-06-06 (×3): 600 mg via ORAL
  Filled 2016-06-04 (×4): qty 1

## 2016-06-04 MED ORDER — VANCOMYCIN HCL 1000 MG IV SOLR
INTRAVENOUS | Status: DC | PRN
  Administered 2016-06-04: 1000 mg

## 2016-06-04 MED ORDER — FENTANYL CITRATE (PF) 100 MCG/2ML IJ SOLN
25.0000 ug | INTRAMUSCULAR | Status: DC | PRN
  Administered 2016-06-04 (×2): 50 ug via INTRAVENOUS

## 2016-06-04 MED ORDER — ONDANSETRON HCL 4 MG/2ML IJ SOLN: 4.0000 mg | INTRAMUSCULAR | Status: DC | PRN

## 2016-06-04 MED ORDER — LORATADINE 10 MG PO TABS
10.0000 mg | ORAL_TABLET | Freq: Every day | ORAL | Status: DC
  Administered 2016-06-04 – 2016-06-06 (×3): 10 mg via ORAL
  Filled 2016-06-04 (×3): qty 1

## 2016-06-04 MED ORDER — FENTANYL CITRATE (PF) 100 MCG/2ML IJ SOLN
INTRAMUSCULAR | Status: DC | PRN
  Administered 2016-06-04 (×2): 50 ug via INTRAVENOUS
  Administered 2016-06-04: 100 ug via INTRAVENOUS

## 2016-06-04 MED ORDER — HYDROMORPHONE HCL 1 MG/ML IJ SOLN
0.5000 mg | INTRAMUSCULAR | Status: DC | PRN
  Administered 2016-06-04 – 2016-06-05 (×3): 1 mg via INTRAVENOUS
  Filled 2016-06-04 (×5): qty 1

## 2016-06-04 MED ORDER — PHENOL 1.4 % MT LIQD: 1.0000 | OROMUCOSAL | Status: DC | PRN

## 2016-06-04 MED ORDER — DIPHENHYDRAMINE HCL 25 MG PO CAPS
25.0000 mg | ORAL_CAPSULE | ORAL | Status: DC | PRN
  Administered 2016-06-06: 25 mg via ORAL
  Filled 2016-06-04: qty 1

## 2016-06-04 MED ORDER — OXYCODONE-ACETAMINOPHEN 5-325 MG PO TABS
1.0000 | ORAL_TABLET | ORAL | Status: DC | PRN
  Administered 2016-06-06: 2 via ORAL
  Filled 2016-06-04 (×2): qty 2

## 2016-06-04 MED ORDER — LACTATED RINGERS IV SOLN: INTRAVENOUS | Status: DC

## 2016-06-04 MED ORDER — MENTHOL 3 MG MT LOZG: 1.0000 | LOZENGE | OROMUCOSAL | Status: DC | PRN

## 2016-06-04 MED ORDER — ROCURONIUM BROMIDE 100 MG/10ML IV SOLN
INTRAVENOUS | Status: DC | PRN
  Administered 2016-06-04: 10 mg via INTRAVENOUS
  Administered 2016-06-04: 50 mg via INTRAVENOUS
  Administered 2016-06-04: 10 mg via INTRAVENOUS

## 2016-06-04 MED ORDER — LACTATED RINGERS IV SOLN
INTRAVENOUS | Status: DC
  Administered 2016-06-04 (×3): via INTRAVENOUS

## 2016-06-04 MED ORDER — MIDAZOLAM HCL 2 MG/2ML IJ SOLN
INTRAMUSCULAR | Status: AC
  Filled 2016-06-04: qty 2

## 2016-06-04 MED ORDER — VERAPAMIL HCL ER 180 MG PO TBCR
180.0000 mg | EXTENDED_RELEASE_TABLET | Freq: Every day | ORAL | Status: DC
  Administered 2016-06-05: 180 mg via ORAL
  Filled 2016-06-04 (×2): qty 1

## 2016-06-04 MED ORDER — BUPIVACAINE HCL (PF) 0.25 % IJ SOLN
INTRAMUSCULAR | Status: DC | PRN
  Administered 2016-06-04: 13 mL

## 2016-06-04 MED ORDER — PANTOPRAZOLE SODIUM 40 MG PO TBEC
40.0000 mg | DELAYED_RELEASE_TABLET | Freq: Every day | ORAL | Status: DC
  Administered 2016-06-05 – 2016-06-06 (×2): 40 mg via ORAL
  Filled 2016-06-04 (×2): qty 1

## 2016-06-04 MED ORDER — CITALOPRAM HYDROBROMIDE 20 MG PO TABS
20.0000 mg | ORAL_TABLET | Freq: Every day | ORAL | Status: DC
  Administered 2016-06-05 – 2016-06-06 (×2): 20 mg via ORAL
  Filled 2016-06-04 (×2): qty 1

## 2016-06-04 MED ORDER — FENTANYL CITRATE (PF) 100 MCG/2ML IJ SOLN
INTRAMUSCULAR | Status: AC
  Filled 2016-06-04: qty 2

## 2016-06-04 MED ORDER — ACETAMINOPHEN 325 MG PO TABS: 650.0000 mg | ORAL_TABLET | ORAL | Status: DC | PRN

## 2016-06-04 MED ORDER — SODIUM CHLORIDE 0.9% FLUSH
3.0000 mL | Freq: Two times a day (BID) | INTRAVENOUS | Status: DC
  Administered 2016-06-06: 3 mL via INTRAVENOUS

## 2016-06-04 MED ORDER — 0.9 % SODIUM CHLORIDE (POUR BTL) OPTIME
TOPICAL | Status: DC | PRN
  Administered 2016-06-04: 1000 mL

## 2016-06-04 MED ORDER — SUGAMMADEX SODIUM 200 MG/2ML IV SOLN
INTRAVENOUS | Status: DC | PRN
  Administered 2016-06-04: 161 mg via INTRAVENOUS

## 2016-06-04 MED ORDER — DIAZEPAM 5 MG PO TABS
5.0000 mg | ORAL_TABLET | Freq: Four times a day (QID) | ORAL | Status: DC | PRN
  Administered 2016-06-04 – 2016-06-05 (×2): 5 mg via ORAL
  Administered 2016-06-06: 10 mg via ORAL
  Filled 2016-06-04 (×2): qty 1
  Filled 2016-06-04: qty 2

## 2016-06-04 MED ORDER — VANCOMYCIN HCL 1000 MG IV SOLR
INTRAVENOUS | Status: AC
  Filled 2016-06-04: qty 1000

## 2016-06-04 MED ORDER — ACETAMINOPHEN 650 MG RE SUPP: 650.0000 mg | RECTAL | Status: DC | PRN

## 2016-06-04 MED ORDER — CEFAZOLIN IN D5W 1 GM/50ML IV SOLN
1.0000 g | Freq: Three times a day (TID) | INTRAVENOUS | Status: AC
  Administered 2016-06-04 – 2016-06-05 (×2): 1 g via INTRAVENOUS
  Filled 2016-06-04 (×3): qty 50

## 2016-06-04 MED ORDER — PHENYLEPHRINE HCL 10 MG/ML IJ SOLN
10.0000 mg | INTRAVENOUS | Status: DC | PRN
  Administered 2016-06-04: 25 ug/min via INTRAVENOUS

## 2016-06-04 MED ORDER — LIDOCAINE HCL (CARDIAC) 20 MG/ML IV SOLN
INTRAVENOUS | Status: DC | PRN
  Administered 2016-06-04: 50 mg via INTRAVENOUS

## 2016-06-04 MED ORDER — MEPERIDINE HCL 25 MG/ML IJ SOLN: 6.2500 mg | INTRAMUSCULAR | Status: DC | PRN

## 2016-06-04 MED ORDER — SURGIFOAM 100 EX MISC
CUTANEOUS | Status: DC | PRN
  Administered 2016-06-04: 14:00:00 via TOPICAL

## 2016-06-04 MED ORDER — HYDROCODONE-ACETAMINOPHEN 5-325 MG PO TABS: 1.0000 | ORAL_TABLET | ORAL | Status: DC | PRN

## 2016-06-04 MED ORDER — PROPOFOL 10 MG/ML IV BOLUS
INTRAVENOUS | Status: DC | PRN
  Administered 2016-06-04: 200 mg via INTRAVENOUS

## 2016-06-04 MED ORDER — DEXAMETHASONE SODIUM PHOSPHATE 10 MG/ML IJ SOLN
INTRAMUSCULAR | Status: AC
  Administered 2016-06-04: 10 mg via INTRAVENOUS
  Filled 2016-06-04: qty 1

## 2016-06-04 MED ORDER — SODIUM CHLORIDE 0.9 % IV SOLN: 250.0000 mL | INTRAVENOUS | Status: DC

## 2016-06-04 MED ORDER — FENTANYL CITRATE (PF) 250 MCG/5ML IJ SOLN
INTRAMUSCULAR | Status: AC
  Filled 2016-06-04: qty 5

## 2016-06-04 MED ORDER — ONDANSETRON HCL 4 MG/2ML IJ SOLN
INTRAMUSCULAR | Status: DC | PRN
  Administered 2016-06-04: 4 mg via INTRAVENOUS

## 2016-06-04 MED ORDER — METOCLOPRAMIDE HCL 5 MG/ML IJ SOLN: 10.0000 mg | Freq: Once | INTRAMUSCULAR | Status: DC | PRN

## 2016-06-04 SURGICAL SUPPLY — 61 items
APL SKNCLS STERI-STRIP NONHPOA (GAUZE/BANDAGES/DRESSINGS) ×1
BAG DECANTER FOR FLEXI CONT (MISCELLANEOUS) ×2 IMPLANT
BENZOIN TINCTURE PRP APPL 2/3 (GAUZE/BANDAGES/DRESSINGS) ×2 IMPLANT
BLADE CLIPPER SURG (BLADE) IMPLANT
BRUSH SCRUB EZ PLAIN DRY (MISCELLANEOUS) ×2 IMPLANT
BUR CUTTER 7.0 ROUND (BURR) IMPLANT
BUR MATCHSTICK NEURO 3.0 LAGG (BURR) ×2 IMPLANT
CANISTER SUCT 3000ML PPV (MISCELLANEOUS) ×2 IMPLANT
CAP LCK SPNE (Orthopedic Implant) ×4 IMPLANT
CAP LOCK SPINE RADIUS (Orthopedic Implant) IMPLANT
CAP LOCKING (Orthopedic Implant) ×8 IMPLANT
CONT SPEC 4OZ CLIKSEAL STRL BL (MISCELLANEOUS) ×2 IMPLANT
COVER BACK TABLE 60X90IN (DRAPES) ×2 IMPLANT
DECANTER SPIKE VIAL GLASS SM (MISCELLANEOUS) ×2 IMPLANT
DEVICE INTERBODY ELEVATE 23X7 (Cage) ×2 IMPLANT
DRAPE C-ARM 42X72 X-RAY (DRAPES) ×4 IMPLANT
DRAPE LAPAROTOMY 100X72X124 (DRAPES) ×2 IMPLANT
DRAPE POUCH INSTRU U-SHP 10X18 (DRAPES) ×2 IMPLANT
DRAPE PROXIMA HALF (DRAPES) IMPLANT
DRAPE SURG 17X23 STRL (DRAPES) ×8 IMPLANT
DURAPREP 26ML APPLICATOR (WOUND CARE) ×2 IMPLANT
ELECT REM PT RETURN 9FT ADLT (ELECTROSURGICAL) ×2
ELECTRODE REM PT RTRN 9FT ADLT (ELECTROSURGICAL) ×1 IMPLANT
EVACUATOR 1/8 PVC DRAIN (DRAIN) ×2 IMPLANT
GAUZE SPONGE 4X4 12PLY STRL (GAUZE/BANDAGES/DRESSINGS) ×2 IMPLANT
GAUZE SPONGE 4X4 16PLY XRAY LF (GAUZE/BANDAGES/DRESSINGS) IMPLANT
GLOVE BIO SURGEON STRL SZ 6.5 (GLOVE) ×2 IMPLANT
GLOVE BIOGEL PI IND STRL 7.0 (GLOVE) IMPLANT
GLOVE BIOGEL PI INDICATOR 7.0 (GLOVE) ×2
GLOVE ECLIPSE 9.0 STRL (GLOVE) ×4 IMPLANT
GLOVE EXAM NITRILE LRG STRL (GLOVE) IMPLANT
GLOVE EXAM NITRILE MD LF STRL (GLOVE) IMPLANT
GLOVE EXAM NITRILE XL STR (GLOVE) IMPLANT
GLOVE EXAM NITRILE XS STR PU (GLOVE) IMPLANT
GLOVE INDICATOR 6.0 STRL GRN (GLOVE) ×1 IMPLANT
GLOVE INDICATOR 7.0 STRL GRN (GLOVE) ×1 IMPLANT
GLOVE INDICATOR 7.5 STRL GRN (GLOVE) ×1 IMPLANT
GOWN STRL REUS W/ TWL LRG LVL3 (GOWN DISPOSABLE) IMPLANT
GOWN STRL REUS W/ TWL XL LVL3 (GOWN DISPOSABLE) ×2 IMPLANT
GOWN STRL REUS W/TWL 2XL LVL3 (GOWN DISPOSABLE) IMPLANT
GOWN STRL REUS W/TWL LRG LVL3 (GOWN DISPOSABLE)
GOWN STRL REUS W/TWL XL LVL3 (GOWN DISPOSABLE) ×4
KIT BASIN OR (CUSTOM PROCEDURE TRAY) ×2 IMPLANT
KIT ROOM TURNOVER OR (KITS) ×2 IMPLANT
LIQUID BAND (GAUZE/BANDAGES/DRESSINGS) ×2 IMPLANT
NEEDLE HYPO 22GX1.5 SAFETY (NEEDLE) ×2 IMPLANT
NS IRRIG 1000ML POUR BTL (IV SOLUTION) ×2 IMPLANT
PACK LAMINECTOMY NEURO (CUSTOM PROCEDURE TRAY) ×2 IMPLANT
ROD RADIUS 40MM (Neuro Prosthesis/Implant) ×4 IMPLANT
ROD SPNL 40X5.5XNS TI RDS (Neuro Prosthesis/Implant) IMPLANT
SCREW 5.75X40M (Screw) ×2 IMPLANT
SPONGE SURGIFOAM ABS GEL 100 (HEMOSTASIS) ×2 IMPLANT
STRIP CLOSURE SKIN 1/2X4 (GAUZE/BANDAGES/DRESSINGS) ×3 IMPLANT
SUT VIC AB 0 CT1 18XCR BRD8 (SUTURE) ×2 IMPLANT
SUT VIC AB 0 CT1 8-18 (SUTURE) ×4
SUT VIC AB 2-0 CT1 18 (SUTURE) ×2 IMPLANT
SUT VIC AB 3-0 SH 8-18 (SUTURE) ×4 IMPLANT
TOWEL OR 17X24 6PK STRL BLUE (TOWEL DISPOSABLE) ×2 IMPLANT
TOWEL OR 17X26 10 PK STRL BLUE (TOWEL DISPOSABLE) ×2 IMPLANT
TRAY FOLEY W/METER SILVER 16FR (SET/KITS/TRAYS/PACK) ×2 IMPLANT
WATER STERILE IRR 1000ML POUR (IV SOLUTION) ×2 IMPLANT

## 2016-06-04 NOTE — Progress Notes (Signed)
Orthopedic Tech Progress Note Patient Details:  Emily [name] [birth year] [id number] Patient already has brace Patient ID: [name], female   DOB: [birth year], 65 y.o.   MRN: [id number]   [name] 06/04/2016, 6:15 PM

## 2016-06-04 NOTE — Brief Op Note (Signed)
06/04/2016  4:35 PM  PATIENT:  Emily [name]  65 y.o. female  PRE-OPERATIVE DIAGNOSIS:  Stenosis  POST-OPERATIVE DIAGNOSIS:  Stenosis  PROCEDURE:  Procedure(s): Posterior Lumbar Interbody Fusion  Lumbar two- lumbar three (N/A)  SURGEON:  Surgeon(s) and Role:    * Earnie Larsson, MD - Primary    * Kary Kos, MD - Assisting  PHYSICIAN ASSISTANT:   ASSISTANTS:    ANESTHESIA:   general  EBL:  Total I/O In: 2400 [I.V.:2400] Out: 485 [Urine:260; Blood:225]  BLOOD ADMINISTERED:none  DRAINS: none   LOCAL MEDICATIONS USED:  MARCAINE     SPECIMEN:  No Specimen  DISPOSITION OF SPECIMEN:  N/A  COUNTS:  YES  TOURNIQUET:  * No tourniquets in log *  DICTATION: .Dragon Dictation  PLAN OF CARE: Admit to inpatient   PATIENT DISPOSITION:  PACU - hemodynamically stable.   Delay start of Pharmacological VTE agent (>24hrs) due to surgical blood loss or risk of bleeding: yes

## 2016-06-04 NOTE — Transfer of Care (Signed)
Immediate Anesthesia Transfer of Care Note  Patient: Emily [name]  Procedure(s) Performed: Procedure(s): Posterior Lumbar Interbody Fusion  Lumbar two- lumbar three (N/A)  Patient Location: PACU  Anesthesia Type:General  Level of Consciousness: awake, alert  and oriented  Airway & Oxygen Therapy: Patient Spontanous Breathing and Patient connected to nasal cannula oxygen  Post-op Assessment: Report given to RN and Post -op Vital signs reviewed and stable  Post vital signs: Reviewed and stable  Last Vitals:  Filed Vitals:   06/04/16 1152  BP: 128/63  Pulse: 61  Temp: 37.2 C  Resp: 18    Last Pain:  Filed Vitals:   06/04/16 1643  PainSc: 7          Complications: No apparent anesthesia complications

## 2016-06-04 NOTE — Anesthesia Procedure Notes (Signed)
Procedure Name: Intubation Date/Time: 06/04/2016 2:02 PM Performed by: Eligha Bridegroom Pre-anesthesia Checklist: Emergency Drugs available, Patient identified, Suction available, Patient being monitored and Timeout performed Patient Re-evaluated:Patient Re-evaluated prior to inductionPreoxygenation: Pre-oxygenation with 100% oxygen Intubation Type: IV induction Ventilation: Oral airway inserted - appropriate to patient size Laryngoscope Size: 3 and Mac Grade View: Grade I Tube type: Oral Tube size: 7.0 mm Number of attempts: 1 Airway Equipment and Method: Stylet Placement Confirmation: ETT inserted through vocal cords under direct vision,  positive ETCO2 and breath sounds checked- equal and bilateral Secured at: 21 cm Tube secured with: Tape Dental Injury: Teeth and Oropharynx as per pre-operative assessment

## 2016-06-04 NOTE — Progress Notes (Signed)
Assessment done by Cindee Lame RN

## 2016-06-04 NOTE — Anesthesia Postprocedure Evaluation (Signed)
Anesthesia Post Note  Patient: Emily [name]  Procedure(s) Performed: Procedure(s) (LRB): Posterior Lumbar Interbody Fusion  Lumbar two- lumbar three (N/A)  Patient location during evaluation: PACU Anesthesia Type: General Level of consciousness: awake and alert Pain management: pain level controlled Vital Signs Assessment: post-procedure vital signs reviewed and stable Respiratory status: spontaneous breathing, nonlabored ventilation, respiratory function stable and patient connected to nasal cannula oxygen Cardiovascular status: blood pressure returned to baseline and stable Postop Assessment: no signs of nausea or vomiting Anesthetic complications: no    Last Vitals:  Filed Vitals:   06/04/16 1702 06/04/16 1716  BP: 106/81 104/60  Pulse: 73 76  Temp:    Resp: 23 16    Last Pain:  Filed Vitals:   06/04/16 1718  PainSc: 6                  Montez Hageman

## 2016-06-04 NOTE — Anesthesia Preprocedure Evaluation (Addendum)
Anesthesia Evaluation  Patient identified by MRN, date of birth, ID band Patient awake    Reviewed: Allergy & Precautions, H&P , NPO status , Patient's Chart, lab work & pertinent test results  Airway Mallampati: II  TM Distance: >3 FB Neck ROM: Full    Dental no notable dental hx. (+) Edentulous Upper   Pulmonary sleep apnea ,    Pulmonary exam normal breath sounds clear to auscultation       Cardiovascular hypertension, +CHF  Normal cardiovascular exam Rhythm:Regular Rate:Normal     Neuro/Psych Anxiety Depression  Neuromuscular disease negative neurological ROS     GI/Hepatic Neg liver ROS, GERD  ,  Endo/Other  negative endocrine ROS  Renal/GU negative Renal ROS  negative genitourinary   Musculoskeletal negative musculoskeletal ROS (+)   Abdominal   Peds negative pediatric ROS (+)  Hematology negative hematology ROS (+)   Anesthesia Other Findings   Reproductive/Obstetrics negative OB ROS                            Anesthesia Physical  Anesthesia Plan  ASA: II  Anesthesia Plan: General   Post-op Pain Management:    Induction: Intravenous  Airway Management Planned: Oral ETT  Additional Equipment:   Intra-op Plan:   Post-operative Plan: Extubation in OR  Informed Consent: I have reviewed the patients History and Physical, chart, labs and discussed the procedure including the risks, benefits and alternatives for the proposed anesthesia with the patient or authorized representative who has indicated his/her understanding and acceptance.   Dental advisory given  Plan Discussed with: CRNA and Surgeon  Anesthesia Plan Comments:         Anesthesia Quick Evaluation

## 2016-06-04 NOTE — Op Note (Signed)
Date of procedure: 06/04/2016  Date of dictation: Same  Service: Neurosurgery  Preoperative diagnosis: L2-L3 stenosis status post L3-L5 decompression infusion  Postoperative diagnosis: Same  Procedure Name: Bilateral L2-L3 redo decompressive laminotomies with bilateral L2 and L3 decompressive foraminotomies, more than would be required for simple interbody fusion alone.  L2-3 posterior lumbar interbody fusion utilizing interbody expandable cages and local autograft  L2-3 posterior lateral arthrodesis utilizing nonsegmental pedicle screw instrumentation and local autograft  Removal of L3-L5 instrumentation  Surgeon:Jermarion Poffenberger A.Nicholi Ghuman, M.D.  Asst. Surgeon: Saintclair Halsted  Anesthesia: General  Indication: 65 year old female status post previous L3-L5 decompression and fusion with good results presents now with worsening back and bilateral lower extremity symptoms failing conservative management. Workup demonstrates evidence of marked adjacent level disc degeneration with stenosis at L2-3. Patient has failed conservative management and presents now for decompression and fusion.  Operative note: After induction anesthesia, patient position prone onto Wilson frame and a properly padded. Lumbar region prepped and draped sterilely. Incision made overlying L2-3. Dissection performed bilaterally. Previously placed L3-L5 instrumentation dissected free. Construct disassembled. Rod's removed. Screws at L4 and L5 removed. Fusion inspected and found to be quite solid. Screws at L3 left in place. Decompressive laminotomy and foraminotomies performed using Leksell rongeurs [employer] years high-speed drill to remove the inferior two thirds of the lamina of L2 the entire inferior facet of L2 bilaterally the majority the superior facet of L3 bilaterally. Ligament flavum and epidural scar were elevated and resected. Underlying thecal sac and exiting L2 and L3 nerve roots identified. Wide decompressive foraminotomies performed  on the course exiting nerve roots. Bilateral discectomies and performed at L2-3. Disc space distracted and disc space then prepared for interbody fusion. With the disc space distracted a 7 mm Medtronic expandable cage packed with locally harvested autograft was impacted in place and expanded to its full extent on the left side. Distractor was removed patient's right side. Disc space prepared on the right side. Soft tissue removed from the interspace. Or slides autograft packed in the interspace. A second cage was then impacted into place and expanded to its full extent. Pedicles of L2 were noted to be hypoplastic preoperatively. No great spinal fixation options existed so I decided to proceed with extra radicular placement of the screw on the right side. On the left side traditional pedicle screw placement was attempted. Pedicle was identified using surface landmarks and intraoperative fluoroscopy. Superficial bone alignment to pedicle was then removed using high-speed drill. Each pedicles and probed using pedicle awl. The pedicle awl was then directed into the vertebral body. This was probed and found to be solidly within bone. Each screw all track was tapped and 5.75 x 40 mm screws were placed bilaterally. AP and lateral images were taken with fluoroscopy unit which demonstrated good position of the cages at the proper upper level with normal alignment of the spine. Pedicle screws at L2 and L3 were well position. Wounds and irrigated MI solution. Gelfoam was placed topically for hemostasis. Lateral residual facet and transverse processes were decorticated. Morselize autograft packed posterior laterally. Short segment titanium rod placed over the screw heads at L2 and L3. Locking caps placed over the screws. Locking caps and engaged with the construct under compression. Wound is irrigated one final time. Vancomycin powder was placed the deep wound space. Wounds and close in layers with Vicryl sutures. Steri-Strips  and sterile dressing were applied. No apparent complications. Patient tolerated the procedure well and she returns to the recovery room postop.

## 2016-06-04 NOTE — H&P (Signed)
Emily [name] is an 65 y.o. female.   Chief Complaint: Back pain HPI: 65 year old female status post previous L3-L5 decompression and fusion presents with worsening back pain and bilateral lower extremity symptoms consistent with neurogenic claudication. Workup demonstrates evidence of adjacent level disc degeneration with adjacent level stenosis. Patient has failed conservative management and presents now for L2-3 decompression and fusion in hopes of improving her symptoms.  Past Medical History  Diagnosis Date  . Acid reflux   . Nerve pain     right leg   . Joint pain   . Depression   . Ruptured lumbar disc   . Hypertension     Does not see a cardiologist no stress or echo  . Anxiety   . HNP (herniated nucleus pulposus)   . History of blood transfusion reaction 1978  . Fibromyalgia   . Complication of anesthesia     History of being told she was allergic to something in 1980 no problems since  . Sleep apnea     Uses CPAP every night setting at 7 mm/Hg  . History of hiatal hernia   . CHF (congestive heart failure) (HCC)     90's swelling body-no breathing problems at that time-no cardiac -in dr s Wolfgang [place] office [place] give lasix and potassium    Past Surgical History  Procedure Laterality Date  . Cesarean section    . Bladder repair      tear during during birth of child  . Gallbladder surgery    . Wrist ganglion cyst left    . Knee right (torn cart)    . Tubal ligation    . Vein right leg removed    . Rotator cuff repair      right  . Replacement total knee      left  . Spinal fusion    . Cholecystectomy      gallbladder disease; stayed sick  . Esophagogastroduodenoscopy N/A 04/28/2016    Procedure: ESOPHAGOGASTRODUODENOSCOPY (EGD);  Surgeon: [name], MD;  Location: [facility];  Service: Endoscopy;  Laterality: N/A;  12:00  . Colonoscopy N/A 04/28/2016    Procedure: COLONOSCOPY;  Surgeon: [name], MD;  Location: [facility];  Service:  Endoscopy;  Laterality: N/A;  . Back surgery      x2  . Carpal tunnel release Bilateral     Family History  Problem Relation Age of Onset  . Cancer      family history   . Diabetes      family history   . Coronary artery disease      family history   . Arthritis      family history   . Asthma    . Hypertension Father   . Cancer Father     liposarcoma  . Heart disease Mother    Social History:  reports that she has never smoked. She has never used smokeless tobacco. She reports that she does not drink alcohol or use illicit drugs.  Allergies:  Allergies  Allergen Reactions  . Codeine Itching  . Oxycodone-Acetaminophen Itching  . Sulfamethoxazole-Trimethoprim Hives  . Celebrex [Celecoxib] Other (See Comments)    Homicidal thoughts.  . Hydrocodone-Acetaminophen Itching    Takes with benadryl  . Lyrica [Pregabalin]     When combined with narcotics will cause SpO2 to drop  . Tramadol Itching    Medications Prior to Admission  Medication Sig Dispense Refill  . acetaminophen (TYLENOL) 500 MG tablet Take 1,000 mg by  mouth 3 (three) times daily as needed for moderate pain.     . citalopram (CELEXA) 20 MG tablet TAKE ONE TABLET BY MOUTH ONCE DAILY 30 tablet 4  . diazepam (VALIUM) 5 MG tablet Take 1-2 tablets (5-10 mg total) by mouth every 6 (six) hours as needed (Muscle spasms). 60 tablet 1  . Fexofenadine HCl (ALLEGRA PO) Take 1 tablet by mouth daily as needed (allergies).     Marland Kitchen gemfibrozil (LOPID) 600 MG tablet Take 1 tablet (600 mg total) by mouth 2 (two) times daily before a meal. 60 tablet 5  . losartan (COZAAR) 100 MG tablet TAKE ONE TABLET BY MOUTH ONCE DAILY. 30 tablet 5  . pantoprazole (PROTONIX) 40 MG tablet Take 1 tablet (40 mg total) by mouth 2 (two) times daily before a meal. (Patient taking differently: Take 40 mg by mouth daily. ) 60 tablet 4  . verapamil (CALAN-SR) 180 MG CR tablet Take 1 tablet (180 mg total) by mouth daily. 30 tablet 4    No results found  for this or any previous visit (from the past 48 hour(s)). No results found.  Pertinent items noted in HPI and remainder of comprehensive ROS otherwise negative.  Blood pressure 128/63, pulse 61, temperature 99 F (37.2 C), temperature source Oral, resp. rate 18, height 5\' 2"  (1.575 m), weight 80.468 kg (177 lb 6.4 oz), SpO2 96 %.  Patient is awake and alert. She is oriented and appropriate. She is slowing. Cranial nerve function is intact. Motor and sensory function extremities normal. Wound clean and dry. Chest and abdomen benign. Extremities free from injury or deformity. Assessment/Plan L2-3 foraminal stenosis with radiculopathy status post L3-L5 fusion with instrumentation. Plan L2-3 bilateral decompressive laminotomies and foraminotomies followed by posterior lumbar interbody fusion utilizing interbody cages and local autograft with posterior lateral arthrodesis utilizing nonsegmental pedicle screw fixation. This will require removal of previously placed since mentation from L3-L5. Risks and benefits of an explained. Patient wishes to proceed.  Emily [name] A 06/04/2016, 1:23 PM

## 2016-06-05 NOTE — Evaluation (Signed)
Occupational Therapy Evaluation and Discharge Patient Details Name: Emily [name] MRN: [id number] DOB: 06-14-1951 Today's Date: 06/05/2016    History of Present Illness Patient is a 65 yo female s/p Bilateral L2-L3 redo decompressive laminotomies with bilateral L2 and L3 decompressive foraminotomies   Clinical Impression   Pt reports she was independent with ADLs and mobility PTA. Currently pt overall min guard assist for functional mobility and ADLs with the exception of min assist for LB ADLs. Provided pt with all back, safety, and ADL education. Pt planning to d/c home with 24/7 supervision from family. No further acute OT needs identified; signing off at this time. Please re-consult if needs change. Thank you for this referral.    Follow Up Recommendations  No OT follow up;Supervision/Assistance - 24 hour (initially)    Equipment Recommendations  3 in 1 bedside comode    Recommendations for Other Services       Precautions / Restrictions Precautions Precautions: Back Precaution Booklet Issued: Yes (comment) Precaution Comments: verbally provided education and handout given Required Braces or Orthoses: Spinal Brace Spinal Brace: Lumbar corset Restrictions Weight Bearing Restrictions: No      Mobility Bed Mobility Overal bed mobility: Modified Independent Bed Mobility: Rolling;Sidelying to Sit Rolling: Modified independent (Device/Increase time) Sidelying to sit: Modified independent (Device/Increase time)       General bed mobility comments: great technique no cues or physical assist required  Transfers Overall transfer level: Needs assistance Equipment used: None Transfers: Sit to/from Stand Sit to Stand: Supervision         General transfer comment: No physical assist required close supervision for safety, increased time to perform    Balance Overall balance assessment: No apparent balance deficits (not formally assessed)                                           ADL Overall ADL's : Needs assistance/impaired Eating/Feeding: Independent;Sitting   Grooming: Min guard;Standing;Wash/dry hands Grooming Details (indicate cue type and reason): Educated pt on use of 2 cups for oral care Upper Body Bathing: Set up;Supervision/ safety;Sitting   Lower Body Bathing: Minimal assistance;Sit to/from stand   Upper Body Dressing : Set up;Supervision/safety;Sitting Upper Body Dressing Details (indicate cue type and reason): to don hospital gown and back brace Lower Body Dressing: Minimal assistance;Sit to/from stand Lower Body Dressing Details (indicate cue type and reason): Difficulty getting to feet without bending. Reports husband or family can assist as needed; no need for AE Toilet Transfer: Min guard;Ambulation;Comfort height toilet;Grab bars Toilet Transfer Details (indicate cue type and reason): Educated pt on use of 3 in 1 over toilet Toileting- Clothing Manipulation and Hygiene: Supervision/safety;Sitting/lateral lean Toileting - Clothing Manipulation Details (indicate cue type and reason): for toilet hygiene. Educated on what she can use for toilet aide if needed.   Tub/Shower Transfer Details (indicate cue type and reason): Educated on supervision for safety with shower transfer. Functional mobility during ADLs: Min guard General ADL Comments: Educated pt on maintaining back precautions during functional activities, log roll technique for bed mobility, donning/doffing brace and wear schedule.      Vision Vision Assessment?: No apparent visual deficits   Perception     Praxis      Pertinent Vitals/Pain Pain Assessment: 0-10 Pain Score: 3  Pain Location: back Pain Descriptors / Indicators: Operative site guarding;Sore Pain Intervention(s): Monitored during session     Hand Dominance  Right   Extremity/Trunk Assessment Upper Extremity Assessment Upper Extremity Assessment: Overall WFL for tasks assessed   Lower  Extremity Assessment Lower Extremity Assessment: Overall WFL for tasks assessed (modest limited ROM at this time RLE)   Cervical / Trunk Assessment Cervical / Trunk Assessment: Other exceptions Cervical / Trunk Exceptions: s/p lumbar sx   Communication Communication Communication: No difficulties   Cognition Arousal/Alertness: Awake/alert Behavior During Therapy: WFL for tasks assessed/performed Overall Cognitive Status: Within Functional Limits for tasks assessed                     General Comments       Exercises       Shoulder Instructions      Home Living Family/patient expects to be discharged to:: Private residence Living Arrangements: Spouse/significant other   Type of Home: House Home Access: Stairs to enter Technical brewer of Steps: 6 Entrance Stairs-Rails: Left Home Layout: Two level Alternate Level Stairs-Number of Steps: flight Alternate Level Stairs-Rails: Can reach both Bathroom Shower/Tub: Occupational psychologist: Standard     Home Equipment: Environmental consultant - 2 wheels;Cane - single point;Adaptive equipment Adaptive Equipment: Reacher        Prior Functioning/Environment Level of Independence: Independent             OT Diagnosis: Acute pain   OT Problem List:     OT Treatment/Interventions:      OT Goals(Current goals can be found in the care plan section) Acute Rehab OT Goals Patient Stated Goal: to go home OT Goal Formulation: All assessment and education complete, DC therapy  OT Frequency:     Barriers to D/C:            Co-evaluation              End of Session Equipment Utilized During Treatment: Back brace  Activity Tolerance: Patient tolerated treatment well Patient left: in chair;with call bell/phone within reach   Time: [id number] OT Time Calculation (min): 22 min ([place]etail with PT) Charges:  OT General Charges $OT Visit: 1 Procedure OT Evaluation $OT Eval Moderate Complexity: 1  Procedure G-Codes:     Binnie Kand M.S., OTR/L [id number]  06/05/2016, 10:09 AM

## 2016-06-05 NOTE — Progress Notes (Signed)
Patient ID: Emily [name], female   DOB: [birth year], 65 y.o.   MRN: [id number] Patient overall significant improvement preoperative radicular symptoms  Strength out of 5 wound clean dry and intact  Mobilized day with physical occupational therapy

## 2016-06-05 NOTE — Evaluation (Signed)
Physical Therapy Evaluation Patient Details Name: Emily [name] MRN: [id number] DOB: [birth year] Today's Date: 06/05/2016   History of Present Illness  Patient is a 65 yo female s/p Bilateral L2-L3 redo decompressive laminotomies with bilateral L2 and L3 decompressive foraminotomies  Clinical Impression  Patient seen for mobility assessment and education s/p spinal surgery. Patient mobilizing well with good recall of precautions. Receptive to education. At this time no further acute PT needs, feel patient will be safe for d/c home with family assist PRN. Will sign off.     Follow Up Recommendations No PT follow up;Supervision - Intermittent    Equipment Recommendations  3in1 (PT)    Recommendations for Other Services       Precautions / Restrictions Precautions Precautions: Back Precaution Booklet Issued: Yes (comment) Precaution Comments: verbally provided education and handout given Required Braces or Orthoses: Spinal Brace Spinal Brace: Lumbar corset Restrictions Weight Bearing Restrictions: No      Mobility  Bed Mobility Overal bed mobility: Modified Independent Bed Mobility: Rolling;Sidelying to Sit Rolling: Modified independent (Device/Increase time) Sidelying to sit: Modified independent (Device/Increase time)       General bed mobility comments: great technique no cues or physical assist required  Transfers Overall transfer level: Needs assistance Equipment used: None Transfers: Sit to/from Stand Sit to Stand: Supervision         General transfer comment: No physical assist required close supervision for safety, increased time to perform  Ambulation/Gait Ambulation/Gait assistance: Supervision   Assistive device: None (pushing IV pole) Gait Pattern/deviations: Step-through pattern;Decreased stride length Gait velocity: decreased Gait velocity interpretation: Below normal speed for age/gender General Gait Details: slow guarded steps with mobility,  cues for increased gait speed  Stairs Stairs: Yes Stairs assistance: Supervision Stair Management: Forwards Number of Stairs: 6 General stair comments: educated on technique  Wheelchair Mobility    Modified Rankin (Stroke Patients Only)       Balance Overall balance assessment: No apparent balance deficits (not formally assessed)                                           Pertinent Vitals/Pain Pain Assessment: 0-10 Pain Score: 3  Pain Location: back Pain Descriptors / Indicators: Operative site guarding;Sore Pain Intervention(s): Monitored during session    Home Living Family/patient expects to be discharged to:: Private residence Living Arrangements: Spouse/significant other   Type of Home: House Home Access: Stairs to enter Entrance Stairs-Rails: Left Entrance Stairs-Number of Steps: 6 Home Layout: Two level Home Equipment: Environmental consultant - 2 wheels;Cane - single point;Adaptive equipment      Prior Function Level of Independence: Independent               Hand Dominance   Dominant Hand: Right    Extremity/Trunk Assessment   Upper Extremity Assessment: Overall WFL for tasks assessed           Lower Extremity Assessment: Overall WFL for tasks assessed (modest limited ROM at this time RLE)      Cervical / Trunk Assessment: Other exceptions  Communication   Communication: No difficulties  Cognition Arousal/Alertness: Awake/alert Behavior During Therapy: WFL for tasks assessed/performed Overall Cognitive Status: Within Functional Limits for tasks assessed                      General Comments      Exercises  Assessment/Plan    PT Assessment Patent does not need any further PT services  PT Diagnosis Difficulty walking;Abnormality of gait;Generalized weakness;Acute pain   PT Problem List    PT Treatment Interventions     PT Goals (Current goals can be found in the Care Plan section) Acute Rehab PT  Goals Patient Stated Goal: to go home PT Goal Formulation: All assessment and education complete, DC therapy    Frequency     Barriers to discharge        Co-evaluation               End of Session Equipment Utilized During Treatment: Gait belt;Back brace Activity Tolerance: Patient tolerated treatment well Patient left: in chair;with call bell/phone within reach Nurse Communication: Mobility status         Time: [id number] PT Time Calculation (min) (ACUTE ONLY): 24 min   Charges:   PT Evaluation $PT Eval Low Complexity: 1 Procedure     PT G CodesDuncan Dull June 24, 2016, 10:07 AM Alben Deeds, PT DPT  [phone]

## 2016-06-06 MED ORDER — OXYCODONE-ACETAMINOPHEN 5-325 MG PO TABS: 1.0000 | ORAL_TABLET | ORAL | Status: DC | PRN

## 2016-06-06 NOTE — Progress Notes (Signed)
Patient is being d/c home. D/c instructions given and patient verbalized understanding. 

## 2016-06-06 NOTE — Discharge Summary (Signed)
  Physician Discharge Summary  Patient ID: Emily [name] MRN: [id number] DOB/AGE: 64-17-1952 64 y.o.  Admit date: 06/04/2016 Discharge date: 06/06/2016  Admission Diagnoses:L2-3 spinal stenosis and instability  Discharge Diagnoses: Same Active Problems:   Lumbar foraminal stenosis   Discharged Condition: good  Hospital Course: Patient was met in the hospital underwent decompressive laminectomy and extension of her fusion L2-3. Postoperatively patient did very well recovered in the floor on the floor was angling and voiding spontaneously tolerating her diet stable for discharge home postop day 2.  Consults: Significant Diagnostic Studies: Treatments: L2-3 posterior lumbar interbody fusion Discharge Exam: Blood pressure 91/58, pulse 64, temperature 98.1 F (36.7 C), temperature source Oral, resp. rate 18, height 5' 2" (1.575 m), weight 80.468 kg (177 lb 6.4 oz), SpO2 96 %. Strength out of 5 wound clean dry and intact  Disposition: Home  Discharge Instructions    DME Bedside commode    Complete by:  As directed             Medication List    TAKE these medications        acetaminophen 500 MG tablet  Commonly known as:  TYLENOL  Take 1,000 mg by mouth 3 (three) times daily as needed for moderate pain.     ALLEGRA PO  Take 1 tablet by mouth daily as needed (allergies).     citalopram 20 MG tablet  Commonly known as:  CELEXA  TAKE ONE TABLET BY MOUTH ONCE DAILY     diazepam 5 MG tablet  Commonly known as:  VALIUM  Take 1-2 tablets (5-10 mg total) by mouth every 6 (six) hours as needed (Muscle spasms).     gemfibrozil 600 MG tablet  Commonly known as:  LOPID  Take 1 tablet (600 mg total) by mouth 2 (two) times daily before a meal.     losartan 100 MG tablet  Commonly known as:  COZAAR  TAKE ONE TABLET BY MOUTH ONCE DAILY.     oxyCODONE-acetaminophen 5-325 MG tablet  Commonly known as:  PERCOCET/ROXICET  Take 1-2 tablets by mouth every 4 (four) hours as needed  for moderate pain.     pantoprazole 40 MG tablet  Commonly known as:  PROTONIX  Take 1 tablet (40 mg total) by mouth 2 (two) times daily before a meal.     verapamil 180 MG CR tablet  Commonly known as:  CALAN-SR  Take 1 tablet (180 mg total) by mouth daily.           Follow-up Information    Follow up with Charlie Pitter, MD.   Specialty:  Neurosurgery   Contact information:   [address] [place] [address] [phone]       Signed: Elaina Hoops 06/06/2016, 9:14 AM

## 2016-06-06 NOTE — Discharge Instructions (Signed)
No lifting no bending no twisting no driving °

## 2016-06-06 NOTE — Progress Notes (Signed)
Patient ID: HYDI BATCH, female   DOB: 04/29/1951, 65 y.o.   MRN: [id number] Patient doing well no leg pain back pain well-controlled  Neurologically intact  Discharge home

## 2016-06-09 MED FILL — Heparin Sodium (Porcine) Inj 1000 Unit/ML: INTRAMUSCULAR | Qty: 30 | Status: AC

## 2016-06-09 MED FILL — Sodium Chloride IV Soln 0.9%: INTRAVENOUS | Qty: 1000 | Status: AC

## 2016-07-06 DIAGNOSIS — L03031 Cellulitis of right toe: Secondary | ICD-10-CM | POA: Diagnosis not present

## 2016-07-07 DIAGNOSIS — M4806 Spinal stenosis, lumbar region: Secondary | ICD-10-CM | POA: Diagnosis not present

## 2016-08-10 DIAGNOSIS — M4806 Spinal stenosis, lumbar region: Secondary | ICD-10-CM | POA: Diagnosis not present

## 2016-08-30 ENCOUNTER — Telehealth: Payer: Self-pay | Admitting: Family Medicine

## 2016-08-30 NOTE — Telephone Encounter (Signed)
Patient stated she noticed her legs were a little swollen last night but they are fine today and she doesn't think she needs anything tonight. Patient scheduled office visit tomorrow for blood pressure check and will bring all her meds to her appointment.

## 2016-08-30 NOTE — Telephone Encounter (Signed)
Patient called today hoping she can be worked in to Dr. Bary Leriche schedule on 09/01/16.  She said it is regarding her blood pressure medication which made her blood pressure dropp to 90/50 something all last week and she discontinued it completely.  Also, she is is starting to swell and wonders if she should go back on the lasix

## 2016-08-30 NOTE — Telephone Encounter (Signed)
Nurses please talk with the patient. May need Lasix. Does patient have this? May need to be called in this afternoon-please talk with me what you find out Patient will need to be seen tomorrow morning. Currently right now I am too far scheduled into the evening to add her-bring all medications with visit.

## 2016-08-31 ENCOUNTER — Encounter: Payer: Self-pay | Admitting: Family Medicine

## 2016-08-31 ENCOUNTER — Ambulatory Visit (INDEPENDENT_AMBULATORY_CARE_PROVIDER_SITE_OTHER): Payer: PPO | Admitting: Family Medicine

## 2016-08-31 VITALS — BP 124/84 | Ht 62.0 in | Wt 177.4 lb

## 2016-08-31 DIAGNOSIS — L659 Nonscarring hair loss, unspecified: Secondary | ICD-10-CM

## 2016-08-31 DIAGNOSIS — I10 Essential (primary) hypertension: Secondary | ICD-10-CM

## 2016-08-31 MED ORDER — POTASSIUM CHLORIDE CRYS ER 20 MEQ PO TBCR: 20.0000 meq | EXTENDED_RELEASE_TABLET | Freq: Every day | ORAL | 5 refills | Status: DC

## 2016-08-31 MED ORDER — VERAPAMIL HCL ER 180 MG PO TBCR: 180.0000 mg | EXTENDED_RELEASE_TABLET | Freq: Every day | ORAL | 4 refills | Status: DC

## 2016-08-31 MED ORDER — TRIAMCINOLONE ACETONIDE 0.1 % EX CREA: TOPICAL_CREAM | CUTANEOUS | 0 refills | Status: DC

## 2016-08-31 MED ORDER — HYDROCHLOROTHIAZIDE 25 MG PO TABS: 25.0000 mg | ORAL_TABLET | Freq: Every day | ORAL | 5 refills | Status: DC

## 2016-08-31 MED ORDER — GEMFIBROZIL 600 MG PO TABS: 600.0000 mg | ORAL_TABLET | Freq: Two times a day (BID) | ORAL | 5 refills | Status: DC

## 2016-08-31 NOTE — Progress Notes (Signed)
   Subjective:    Patient ID: Emily [name], female    DOB: Jun 30, 1951, 65 y.o.   MRN: [id number]  Hypertension  This is a chronic problem. The current episode started more than 1 year ago.   Patient try to watch salt diet takes her medicine. Recently stop losartan because of side effects. Patient also has c/o rash to neck. Has tried Hydrocortisone cream. Onset 1 week ago. Patient relates low bit of loss or hair. We will check thyroid testing.  Also c/o edema to ankles at bedtime.   Review of Systems    denies chest tightness pressure pain shortness breath does have some back pain from recent surgery Objective:   Physical Exam There is a little bit rash around the neck lungs clear heart trigger pulse normal BP good no significant edema noted in the ankles       Assessment & Plan:  Probable contact dermatitis around the neck I recommend stronger steroid cream if it doesn't clear up over the next 1-2 weeks notify us Thyroid testing because of some loss of hair Blood pressure continue verapamil. Add hydrochlorothiazide 25 mg daily along with potassium daily in addition to this check metabolic 7 in approximately 7 days in addition to this recommended patient follow-up with [place] in approximate 46 weeks to recheck blood pressure I believe the small amount of swelling in the ankles related to the verapamil.

## 2016-09-08 DIAGNOSIS — M48061 Spinal stenosis, lumbar region without neurogenic claudication: Secondary | ICD-10-CM | POA: Diagnosis not present

## 2016-09-27 ENCOUNTER — Ambulatory Visit (HOSPITAL_COMMUNITY): Payer: PPO | Attending: Neurosurgery | Admitting: Physical Therapy

## 2016-09-27 DIAGNOSIS — M6281 Muscle weakness (generalized): Secondary | ICD-10-CM | POA: Diagnosis not present

## 2016-09-27 DIAGNOSIS — G8929 Other chronic pain: Secondary | ICD-10-CM | POA: Diagnosis not present

## 2016-09-27 DIAGNOSIS — R262 Difficulty in walking, not elsewhere classified: Secondary | ICD-10-CM

## 2016-09-27 DIAGNOSIS — M545 Low back pain: Secondary | ICD-10-CM | POA: Diagnosis not present

## 2016-09-27 DIAGNOSIS — R2681 Unsteadiness on feet: Secondary | ICD-10-CM

## 2016-09-27 NOTE — Patient Instructions (Signed)
   BRIDGING  While lying on your back, tighten your lower abdominals, squeeze your buttocks and then raise your buttocks off the floor/bed as creating a "Bridge" with your body.   Repeat 5-10 times, 2-3 times per day.   Knee Extension: Sit to Stand (Eccentric)    Stand close to chair. Slowly lower self to seated position. _10__ reps per set, _2-3__ sets per day, _5-7__ days per week. Progress to stopping midway before lowering to chair. Progress to barely touching chair.  Copyright  VHI. All rights reserved.     TANDEM STANCE WITH SUPPORT  Stand in front of a chair, table or counter top for support. Then place the heel of one foot so that it is touching the toes of the other foot. Maintain your balance in this position.  Repeat twice each side, holding as long as you can, 2-3 times per day.
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
## 2016-09-30 ENCOUNTER — Ambulatory Visit (HOSPITAL_COMMUNITY): Payer: PPO

## 2016-09-30 DIAGNOSIS — R262 Difficulty in walking, not elsewhere classified: Secondary | ICD-10-CM

## 2016-09-30 DIAGNOSIS — G8929 Other chronic pain: Secondary | ICD-10-CM

## 2016-09-30 DIAGNOSIS — M545 Low back pain: Principal | ICD-10-CM

## 2016-09-30 DIAGNOSIS — R2681 Unsteadiness on feet: Secondary | ICD-10-CM

## 2016-09-30 DIAGNOSIS — M6281 Muscle weakness (generalized): Secondary | ICD-10-CM

## 2016-09-30 NOTE — Therapy (Signed)
[facility] [address] [place], [place], [address] Phone: [phone]   Fax:  [phone]  Physical Therapy Treatment  Patient Details  Name: Emily [name] MRN: [id number] Date of Birth: 14-Nov-1951 Referring Provider: Earnie Larsson   Encounter Date: 09/30/2016      PT End of Session - 09/30/16 1458    Visit Number 2   Number of Visits 12   Date for PT Re-Evaluation 10/18/16   Authorization Type [employer]    Authorization Time Period 09/17/16 to 11/08/16   Authorization - Visit Number 2   Authorization - Number of Visits 10   PT Start Time [id number]   PT Stop Time 1522   PT Time Calculation (min) 29 min   Activity Tolerance Patient tolerated treatment well   Behavior During Therapy [facility] for tasks assessed/performed      Past Medical History:  Diagnosis Date  . Acid reflux   . Anxiety   . CHF (congestive heart failure) (HCC)    90's swelling body-no breathing problems at that time-no cardiac -in dr s Wolfgang [place] office  give lasix and potassium  . Complication of anesthesia    History of being told she was allergic to something in 1980 no problems since  . Depression   . Fibromyalgia   . History of blood transfusion reaction 1978  . History of hiatal hernia   . HNP (herniated nucleus pulposus)   . Hypertension    Does not see a cardiologist no stress or echo  . Joint pain   . Nerve pain    right leg   . Ruptured lumbar disc   . Sleep apnea    Uses CPAP every night setting at 7 mm/Hg    Past Surgical History:  Procedure Laterality Date  . BACK SURGERY     x2  . BLADDER REPAIR     tear during during birth of child  . CARPAL TUNNEL RELEASE Bilateral   . CESAREAN SECTION    . CHOLECYSTECTOMY     gallbladder disease; stayed sick  . COLONOSCOPY N/A 04/28/2016   Procedure: COLONOSCOPY;  Surgeon: [name], MD;  [facility];  Service: Endoscopy;  Laterality: N/A;  .  ESOPHAGOGASTRODUODENOSCOPY N/A 04/28/2016   Procedure: ESOPHAGOGASTRODUODENOSCOPY (EGD);  Surgeon: [name], MD;  [facility];  Service: Endoscopy;  Laterality: N/A;  12:00  . GALLBLADDER SURGERY    . knee right (torn Cart)    . REPLACEMENT TOTAL KNEE     left  . ROTATOR CUFF REPAIR     right  . SPINAL FUSION    . TUBAL LIGATION    . vein right leg removed    . wrist ganglion cyst left      There were no vitals filed for this visit.      Subjective Assessment - 09/30/16 1450    Subjective Pt late for apt today.  Pt stated her Rt lower back and Rt groin pain scale 5-7/10 mainly sore nagging pain with intermittent sharp pain dependent upon certain movements.   Pertinent History HTN, OSA, history of L knee replacement, spinal stenosis, fibromyalgia, hx ruptured discs, anxiety, history of 3 back surgeries for stensosis histy of of spinal fusion surgery L3-5   Patient Stated Goals gain strength, reduce pain, improve function    Currently in Pain? Yes   Pain Score 7    Pain Location Back   Pain Orientation Lower;Right   Pain Descriptors / Indicators  Nagging   Pain Type Chronic pain   Pain Onset More than a month ago   Pain Frequency Intermittent   Aggravating Factors  sit/stand for long periods of time on hard surfaces   Pain Relieving Factors changing position   Effect of Pain on Daily Activities slows down ADLs increases pain                         [facility] Adult PT Treatment/Exercise - 09/30/16 0001      Exercises   Exercises Lumbar     Lumbar Exercises: Standing   Other Standing Lumbar Exercises Tandem stance 2x 30"     Lumbar Exercises: Seated   Sit to Stand 10 reps   Sit to Stand Limitations eccentric control with no HHA     Lumbar Exercises: Supine   Ab Set 10 reps;5 seconds                  PT Short Term Goals - 09/27/16 1042      PT SHORT TERM GOAL #1   Title Patient to experience no more than 4/10 pain at worst in  order to improve QOL and general activity tolerance    Time 3   Period Weeks   Status New     PT SHORT TERM GOAL #2   Title Patient to be able to ambulate at least 765ft during 3MWT in order to show improved general mobility and community access    Time 3   Period Weeks   Status New     PT SHORT TERM GOAL #3   Title Patient to be independent in correctly and consistently performing appropriate HEP, to be updated PRN    Time 1   Period Weeks   Status New           PT Long Term Goals - 09/27/16 1043      PT LONG TERM GOAL #1   Title Patient to demonstrate strength 5/5 in all tested muscle groups in order to assist in improving functional task performance and tolerance    Time 6   Period Weeks   Status New     PT LONG TERM GOAL #2   Title Patient to be able to maintain SLS for at least 20 seconds on each LE in order to show improved stability and reduced fall risk    Time 6   Period Weeks   Status New     PT LONG TERM GOAL #3   Title Patient to be able to ambulate up/down inclines/declines with no exacerbation of pain or fatigue in order to improve general access to community    Time 6   Period Weeks   Status New     PT LONG TERM GOAL #4   Title Patient to be participatory in appropriate regular aerobic activity program, at least 20 minutes in duration and at least 4 days per week, in order to maintain functional gains and improve general health status    Time 6   Period Weeks   Status New               Plan - 09/30/16 1513    Clinical Impression Statement Pt late for session, unable to complete full session due to time limitations.  Began session reviewing goals, compliace with HEP and copy of eval given to pt.  Pt able to demonstrate appropriate form and technique with minimal cueing to improve abdominal contraction for stability wtih  therex.  EOS pt reports pain reduced to 2/10.  Pt given additional HEP addressing abdominal strengthening to assist with pain  control and reducing incontinence.   Rehab Potential Good   Clinical Impairments Affecting Rehab Potential extensive lumbar surgical history, fibromyalgia    PT Frequency 2x / week   PT Duration 6 weeks   PT Treatment/Interventions ADLs/Self Care Home Management;Biofeedback;Cryotherapy;Moist Heat;DME Instruction;Gait training;Stair training;Functional mobility training;Therapeutic activities;Therapeutic exercise;Balance training;Neuromuscular re-education;Patient/family education;Manual techniques;Scar mobilization;Passive range of motion;Energy conservation;Taping   PT Next Visit Plan Continue session focus on improving functional strengthening, balance primarily. AVOID BENDING AND LIFTING OVER 5-10# PER MD PRECAUTIONS.    PT Home Exercise Plan 10/23: eccentric sit to stand, bridges, tandem stance 09/30/2016: abdominal isometrics      Patient will benefit from skilled therapeutic intervention in order to improve the following deficits and impairments:  Abnormal gait, Improper body mechanics, Pain, Decreased coordination, Decreased mobility, Postural dysfunction, Decreased activity tolerance, Decreased range of motion, Decreased strength, Hypomobility, Decreased balance, Difficulty walking, Impaired flexibility  Visit Diagnosis: Chronic low back pain, unspecified back pain laterality, with sciatica presence unspecified  Muscle weakness (generalized)  Difficulty in walking, not elsewhere classified  Unsteadiness on feet     Problem List Patient Active Problem List   Diagnosis Date Noted  . Lumbar foraminal stenosis 06/04/2016  . Carpal tunnel syndrome, bilateral 02/07/2014  . S/P lumbar spinal fusion 02/07/2014  . Obstructive sleep apnea 02/04/2014  . Hand arthritis 10/15/2013  . HTN (hypertension), benign 08/24/2013  . Hypertriglyceridemia 08/24/2013  . Iliotibial band tendonitis 02/08/2013  . Trigger finger, acquired 02/08/2013  . Low back pain 06/22/2011  . HNP (herniated  nucleus pulposus) 06/22/2011  . HIP PAIN, RIGHT 12/15/2010  . LUMBOSACRAL SPONDYLOSIS WITHOUT MYELOPATHY 12/15/2010  . SPINAL STENOSIS, LUMBAR 12/15/2010  . KNEE REPLACEMENT, HX OF 02/26/2010  . DERANGEMENT MENISCUS 07/03/2009  . KNEE, ARTHRITIS, DEGEN./OSTEO 05/22/2009  . KNEE PAIN 05/22/2009  . ANSERINE BURSITIS 05/22/2009  . TRIGGER FINGER 09/23/2008   Ihor Austin, [place]; [place]  Aldona Lento 09/30/2016, 3:24 PM  [place], [place], [address] Phone: [phone]   Fax:  [phone]  Name: Emily [name] MRN: [id number] Date of Birth: 1951-11-24

## 2016-09-30 NOTE — Patient Instructions (Signed)
Isometric Abdominal    Lying on back with knees bent, tighten stomach by pressing elbows down. Hold 5 seconds. Repeat 10 times per set. Do 1-2 sets per session. Do 2 sessions per day.  http://orth.exer.us/1087   Copyright  VHI. All rights reserved.  Isometric Abdominal Contraction    Tuck in stomach muscles and push low back against back of chair. Hold 5 seconds while counting out loud. Repeat 10 times. Do 2 sessions per day.  http://gt2.exer.us/624   Copyright  VHI. All rights reserved.

## 2016-10-01 DIAGNOSIS — L659 Nonscarring hair loss, unspecified: Secondary | ICD-10-CM | POA: Diagnosis not present

## 2016-10-01 DIAGNOSIS — I10 Essential (primary) hypertension: Secondary | ICD-10-CM | POA: Diagnosis not present

## 2016-10-02 LAB — BASIC METABOLIC PANEL
BUN/Creatinine Ratio: 16 (ref 12–28)
BUN: 12 mg/dL (ref 8–27)
CALCIUM: 9.4 mg/dL (ref 8.7–10.3)
CHLORIDE: 104 mmol/L (ref 96–106)
CO2: 23 mmol/L (ref 18–29)
Creatinine, Ser: 0.77 mg/dL (ref 0.57–1.00)
GFR calc non Af Amer: 82 mL/min/{1.73_m2} (ref >59)
GFR, EST AFRICAN AMERICAN: 95 mL/min/{1.73_m2} (ref >59)
Glucose: 103 mg/dL — ABNORMAL HIGH (ref 65–99)
Potassium: 4.2 mmol/L (ref 3.5–5.2)
Sodium: 147 mmol/L — ABNORMAL HIGH (ref 134–144)

## 2016-10-02 LAB — TSH: TSH: 1.67 u[IU]/mL (ref 0.450–4.500)

## 2016-10-04 ENCOUNTER — Ambulatory Visit (HOSPITAL_COMMUNITY): Payer: PPO | Admitting: Physical Therapy

## 2016-10-04 DIAGNOSIS — R262 Difficulty in walking, not elsewhere classified: Secondary | ICD-10-CM

## 2016-10-04 DIAGNOSIS — R2681 Unsteadiness on feet: Secondary | ICD-10-CM

## 2016-10-04 DIAGNOSIS — M545 Low back pain: Principal | ICD-10-CM

## 2016-10-04 DIAGNOSIS — G8929 Other chronic pain: Secondary | ICD-10-CM

## 2016-10-04 DIAGNOSIS — M6281 Muscle weakness (generalized): Secondary | ICD-10-CM

## 2016-10-04 NOTE — Therapy (Signed)
[place], [place], [address] Phone: [phone]   Fax:  [phone]  Physical Therapy Treatment  Patient Details  Name: Emily [name] MRN: [id number] Date of Birth: Mar 16, 1951 Referring Provider: Earnie Larsson   Encounter Date: 10/04/2016      PT End of Session - 10/04/16 0858    Visit Number 3   Number of Visits 12   Date for PT Re-Evaluation 10/18/16   Authorization Type [employer]    Authorization Time Period 09/17/16 to 11/08/16   Authorization - Visit Number 3   Authorization - Number of Visits 10   PT Start Time 0816   PT Stop Time 0857   PT Time Calculation (min) 41 min   Activity Tolerance Patient tolerated treatment well   Behavior During Therapy [facility] for tasks assessed/performed      Past Medical History:  Diagnosis Date  . Acid reflux   . Anxiety   . CHF (congestive heart failure) (HCC)    90's swelling body-no breathing problems at that time-no cardiac -in dr s Wolfgang [place] office [place] give lasix and potassium  . Complication of anesthesia    History of being told she was allergic to something in 1980 no problems since  . Depression   . Fibromyalgia   . History of blood transfusion reaction 1978  . History of hiatal hernia   . HNP (herniated nucleus pulposus)   . Hypertension    Does not see a cardiologist no stress or echo  . Joint pain   . Nerve pain    right leg   . Ruptured lumbar disc   . Sleep apnea    Uses CPAP every night setting at 7 mm/Hg    Past Surgical History:  Procedure Laterality Date  . BACK SURGERY     x2  . BLADDER REPAIR     tear during during birth of child  . CARPAL TUNNEL RELEASE Bilateral   . CESAREAN SECTION    . CHOLECYSTECTOMY     gallbladder disease; stayed sick  . COLONOSCOPY N/A 04/28/2016   Procedure: COLONOSCOPY;  Surgeon: [name], MD;  [facility];  Service: Endoscopy;  Laterality: N/A;  .  ESOPHAGOGASTRODUODENOSCOPY N/A 04/28/2016   Procedure: ESOPHAGOGASTRODUODENOSCOPY (EGD);  Surgeon: [name], MD;  [facility];  Service: Endoscopy;  Laterality: N/A;  12:00  . GALLBLADDER SURGERY    . knee right (torn Cart)    . REPLACEMENT TOTAL KNEE     left  . ROTATOR CUFF REPAIR     right  . SPINAL FUSION    . TUBAL LIGATION    . vein right leg removed    . wrist ganglion cyst left      There were no vitals filed for this visit.      Subjective Assessment - 10/04/16 0818    Subjective Patient arrives today stating she is feeling "fair", attributes this to her fibromyalgia, she states no change after last session despite clinician reports of reduced pain last session    Pertinent History HTN, OSA, history of L knee replacement, spinal stenosis, fibromyalgia, hx ruptured discs, anxiety, history of 3 back surgeries for stensosis histy of of spinal fusion surgery L3-5   Patient Stated Goals gain strength, reduce pain, improve function    Currently in Pain? Yes   Pain Score 6    Pain Location Other (Comment)  from waist down    Pain Orientation Right;Left  Pain Descriptors / Indicators Aching;Sore                         [facility] Adult PT Treatment/Exercise - 10/04/16 0001      Lumbar Exercises: Stretches   Active Hamstring Stretch 2 reps;30 seconds     Lumbar Exercises: Standing   Heel Raises 10 reps   Heel Raises Limitations heel and toe    Other Standing Lumbar Exercises forward/lateral step ups 1x10, 4 inch box      Lumbar Exercises: Supine   Ab Set 15 reps;5 seconds   Bent Knee Raise 15 reps;5 seconds   Bridge 15 reps   Other Supine Lumbar Exercises supine hip ABD with red TB 1x10 each      Lumbar Exercises: Sidelying   Clam 10 reps   Clam Limitations green TB      Lumbar Exercises: Prone   Straight Leg Raise 5 reps   Straight Leg Raises Limitations knee bent              Balance Exercises - 10/04/16 0845      Balance  Exercises: Standing   Standing Eyes Closed Narrow base of support (BOS);Foam/compliant surface;4 reps;20 secs   Tandem Stance Eyes open;3 reps;15 secs   SLS Eyes open;Solid surface;3 reps;10 secs   Rockerboard Anterior/posterior;Lateral;UE support;Other (comment)  fingertip touch, x2 minutes each way            PT Education - 10/04/16 0857    Education provided Yes   Education Details possible DOMS    Person(s) Educated Patient   Methods Explanation   Comprehension Verbalized understanding          PT Short Term Goals - 09/27/16 1042      PT SHORT TERM GOAL #1   Title Patient to experience no more than 4/10 pain at worst in order to improve QOL and general activity tolerance    Time 3   Period Weeks   Status New     PT SHORT TERM GOAL #2   Title Patient to be able to ambulate at least 730ft during 3MWT in order to show improved general mobility and community access    Time 3   Period Weeks   Status New     PT SHORT TERM GOAL #3   Title Patient to be independent in correctly and consistently performing appropriate HEP, to be updated PRN    Time 1   Period Weeks   Status New           PT Long Term Goals - 09/27/16 1043      PT LONG TERM GOAL #1   Title Patient to demonstrate strength 5/5 in all tested muscle groups in order to assist in improving functional task performance and tolerance    Time 6   Period Weeks   Status New     PT LONG TERM GOAL #2   Title Patient to be able to maintain SLS for at least 20 seconds on each LE in order to show improved stability and reduced fall risk    Time 6   Period Weeks   Status New     PT LONG TERM GOAL #3   Title Patient to be able to ambulate up/down inclines/declines with no exacerbation of pain or fatigue in order to improve general access to community    Time 6   Period Weeks   Status New     PT LONG TERM GOAL #  4   Title Patient to be participatory in appropriate regular aerobic activity program, at least  20 minutes in duration and at least 4 days per week, in order to maintain functional gains and improve general health status    Time 6   Period Weeks   Status New               Plan - 10/04/16 0858    Clinical Impression Statement Continued with functional strength/balance POC today, with good from noted throughout from patient; continued to encourage core activation with all exercises today and introduced supine hamstring stretches today due to patient complaints of spasm in this area at home with monitoring for form to maintain back in a neutral position.    Rehab Potential Good   Clinical Impairments Affecting Rehab Potential extensive lumbar surgical history, fibromyalgia    PT Frequency 2x / week   PT Duration 6 weeks   PT Treatment/Interventions ADLs/Self Care Home Management;Biofeedback;Cryotherapy;Moist Heat;DME Instruction;Gait training;Stair training;Functional mobility training;Therapeutic activities;Therapeutic exercise;Balance training;Neuromuscular re-education;Patient/family education;Manual techniques;Scar mobilization;Passive range of motion;Energy conservation;Taping   PT Next Visit Plan Continue session focus on improving functional strengthening, balance primarily. AVOID BENDING AND LIFTING OVER 5-10# PER MD PRECAUTIONS.    PT Home Exercise Plan 10/23: eccentric sit to stand, bridges, tandem stance 09/30/2016: abdominal isometrics   Consulted and Agree with Plan of Care Patient      Patient will benefit from skilled therapeutic intervention in order to improve the following deficits and impairments:  Abnormal gait, Improper body mechanics, Pain, Decreased coordination, Decreased mobility, Postural dysfunction, Decreased activity tolerance, Decreased range of motion, Decreased strength, Hypomobility, Decreased balance, Difficulty walking, Impaired flexibility  Visit Diagnosis: Chronic low back pain, unspecified back pain laterality, with sciatica presence  unspecified  Muscle weakness (generalized)  Difficulty in walking, not elsewhere classified  Unsteadiness on feet     Problem List Patient Active Problem List   Diagnosis Date Noted  . Lumbar foraminal stenosis 06/04/2016  . Carpal tunnel syndrome, bilateral 02/07/2014  . S/P lumbar spinal fusion 02/07/2014  . Obstructive sleep apnea 02/04/2014  . Hand arthritis 10/15/2013  . HTN (hypertension), benign 08/24/2013  . Hypertriglyceridemia 08/24/2013  . Iliotibial band tendonitis 02/08/2013  . Trigger finger, acquired 02/08/2013  . Low back pain 06/22/2011  . HNP (herniated nucleus pulposus) 06/22/2011  . HIP PAIN, RIGHT 12/15/2010  . LUMBOSACRAL SPONDYLOSIS WITHOUT MYELOPATHY 12/15/2010  . SPINAL STENOSIS, LUMBAR 12/15/2010  . KNEE REPLACEMENT, HX OF 02/26/2010  . DERANGEMENT MENISCUS 07/03/2009  . KNEE, ARTHRITIS, DEGEN./OSTEO 05/22/2009  . KNEE PAIN 05/22/2009  . ANSERINE BURSITIS 05/22/2009  . TRIGGER FINGER 09/23/2008    Deniece Ree PT, DPT [phone]  [facility] [address] [place], [place], [address] Phone: [phone]   Fax:  [phone]  Name: Emily [name] MRN: [id number] Date of Birth: [birth year]

## 2016-10-06 ENCOUNTER — Ambulatory Visit (INDEPENDENT_AMBULATORY_CARE_PROVIDER_SITE_OTHER): Payer: PPO | Admitting: Family Medicine

## 2016-10-06 ENCOUNTER — Encounter: Payer: Self-pay | Admitting: Family Medicine

## 2016-10-06 VITALS — BP 120/76 | Ht 62.0 in | Wt 179.8 lb

## 2016-10-06 DIAGNOSIS — E669 Obesity, unspecified: Secondary | ICD-10-CM | POA: Insufficient documentation

## 2016-10-06 DIAGNOSIS — M797 Fibromyalgia: Secondary | ICD-10-CM

## 2016-10-06 DIAGNOSIS — F411 Generalized anxiety disorder: Secondary | ICD-10-CM

## 2016-10-06 DIAGNOSIS — Z6832 Body mass index (BMI) 32.0-32.9, adult: Secondary | ICD-10-CM

## 2016-10-06 DIAGNOSIS — I10 Essential (primary) hypertension: Secondary | ICD-10-CM | POA: Diagnosis not present

## 2016-10-06 MED ORDER — DULOXETINE HCL 30 MG PO CPEP
30.0000 mg | ORAL_CAPSULE | Freq: Every day | ORAL | 3 refills | Status: DC
Start: 2016-10-06 — End: 2017-01-17

## 2016-10-06 MED ORDER — CYCLOBENZAPRINE HCL 10 MG PO TABS: ORAL_TABLET | ORAL | 3 refills | Status: DC

## 2016-10-06 NOTE — Progress Notes (Signed)
   Subjective:    Patient ID: Emily [name], female    DOB: 1951-10-23, 65 y.o.   MRN: [id number]  Hypertension  This is a chronic problem. The current episode started more than 1 year ago. Pertinent negatives include no chest pain, headaches or shortness of breath. Treatments tried: hctz, verapamil. There are no compliance problems.    Patient states she is having a lot of trouble with fibromyalgia She describes as is muscle aches discomfort stiffness soreness she is had previous lab work which showed normal sedimentation rate and a normal rheumatoid factor. She relates the discomfort is such where it interferes with her ability to do the necessary items around the house plus also a difficult time with resting at night. She does fine at times it does get on her nerves I makes her feel down regarding this. She denies being suicidal. havinfg myalgias Stiffness Muscles and joints also fatigue all day long Hurts continuous  Difficulty thinking Poor task completion Memory slight issues Sometimes difficulty with word rememberence Moods down depressed at times  anxious at times   Review of Systems  Constitutional: Negative for activity change, fatigue and fever.  Respiratory: Negative for cough and shortness of breath.   Cardiovascular: Negative for chest pain and leg swelling.  Neurological: Negative for headaches.       Objective:   Physical Exam  Constitutional: She appears well-nourished. No distress.  Cardiovascular: Normal rate, regular rhythm and normal heart sounds.   No murmur heard. Pulmonary/Chest: Effort normal and breath sounds normal. No respiratory distress.  Musculoskeletal: She exhibits no edema.  Lymphadenopathy:    She has no cervical adenopathy.  Neurological: She is alert. She exhibits normal muscle tone.  Psychiatric: Her behavior is normal.  Vitals reviewed.  Patient doing phys therapy via Dr Trenton Gammon Nutritional counseling        Assessment & Plan:    Chronic low back pain following through with physical therapy and what Dr. Trenton Gammon has to say Hypertension good control continue current measures Fibromyalgia 25 minutes spent with the patient discussing monitoring and control of fibromyalgia. Recommended gentle massage, warm baths, stretching, light exercise, also recommend Cymbalta 30 mg daily with a follow-up office visit within 3-6 weeks side effects discussed, I do not feel additional lab work x-rays were injections would be helpful. May use Flexeril at nighttime one half to a whole one as needed to help with rest caution drowsiness  Referral for cognitive behavioral therapy to help with the stress related issues Referral to nutritionist.

## 2016-10-07 ENCOUNTER — Encounter: Payer: Self-pay | Admitting: Family Medicine

## 2016-10-07 ENCOUNTER — Ambulatory Visit (HOSPITAL_COMMUNITY): Payer: PPO | Attending: Neurosurgery | Admitting: Physical Therapy

## 2016-10-07 DIAGNOSIS — M6281 Muscle weakness (generalized): Secondary | ICD-10-CM | POA: Diagnosis not present

## 2016-10-07 DIAGNOSIS — G8929 Other chronic pain: Secondary | ICD-10-CM

## 2016-10-07 DIAGNOSIS — R2681 Unsteadiness on feet: Secondary | ICD-10-CM

## 2016-10-07 DIAGNOSIS — M545 Low back pain: Secondary | ICD-10-CM | POA: Diagnosis not present

## 2016-10-07 DIAGNOSIS — R262 Difficulty in walking, not elsewhere classified: Secondary | ICD-10-CM | POA: Diagnosis not present

## 2016-10-07 NOTE — Therapy (Signed)
[place], [place], [address] Phone: [phone]   Fax:  [phone]  Physical Therapy Treatment  Patient Details  Name: Emily [name] MRN: [id number] Date of Birth: 24-Mar-1951 Referring Provider: Earnie Larsson   Encounter Date: 10/07/2016      PT End of Session - 10/07/16 1006    Visit Number 4   Number of Visits 12   Date for PT Re-Evaluation 10/18/16   Authorization Type [employer]    Authorization Time Period 09/17/16 to 11/08/16   Authorization - Visit Number 4   Authorization - Number of Visits 10   PT Start Time 0905   PT Stop Time 0945   PT Time Calculation (min) 40 min   Activity Tolerance Patient tolerated treatment well   Behavior During Therapy [facility] for tasks assessed/performed      Past Medical History:  Diagnosis Date  . Acid reflux   . Anxiety   . CHF (congestive heart failure) (HCC)    90's swelling body-no breathing problems at that time-no cardiac -in dr s Wolfgang [place] office [place] give lasix and potassium  . Complication of anesthesia    History of being told she was allergic to something in 1980 no problems since  . Depression   . Fibromyalgia   . History of blood transfusion reaction 1978  . History of hiatal hernia   . HNP (herniated nucleus pulposus)   . Hypertension    Does not see a cardiologist no stress or echo  . Joint pain   . Nerve pain    right leg   . Ruptured lumbar disc   . Sleep apnea    Uses CPAP every night setting at 7 mm/Hg    Past Surgical History:  Procedure Laterality Date  . BACK SURGERY     x2  . BLADDER REPAIR     tear during during birth of child  . CARPAL TUNNEL RELEASE Bilateral   . CESAREAN SECTION    . CHOLECYSTECTOMY     gallbladder disease; stayed sick  . COLONOSCOPY N/A 04/28/2016   Procedure: COLONOSCOPY;  Surgeon: [name], MD;  [facility];  Service: Endoscopy;  Laterality: N/A;  . ESOPHAGOGASTRODUODENOSCOPY  N/A 04/28/2016   Procedure: ESOPHAGOGASTRODUODENOSCOPY (EGD);  Surgeon: [name], MD;  [facility];  Service: Endoscopy;  Laterality: N/A;  12:00  . GALLBLADDER SURGERY    . knee right (torn Cart)    . REPLACEMENT TOTAL KNEE     left  . ROTATOR CUFF REPAIR     right  . SPINAL FUSION    . TUBAL LIGATION    . vein right leg removed    . wrist ganglion cyst left      There were no vitals filed for this visit.      Subjective Assessment - 10/07/16 0906    Subjective Patient arrives today stating she is on a couple of new medicines, she feels like her new anti-depressant is helping. She seems to believe that Cymbalta is directly affecting her pain. Nothing else major going on.    Pertinent History HTN, OSA, history of L knee replacement, spinal stenosis, fibromyalgia, hx ruptured discs, anxiety, history of 3 back surgeries for stensosis histy of of spinal fusion surgery L3-5   Patient Stated Goals gain strength, reduce pain, improve function    Currently in Pain? Yes   Pain Score 4    Pain Location Other (Comment)  all over  Pain Orientation Other (Comment)  all over    Pain Descriptors / Indicators Aching;Sore   Pain Type Chronic pain   Pain Radiating Towards just all over in general    Pain Onset More than a month ago   Pain Frequency Intermittent   Aggravating Factors  movement in general    Pain Relieving Factors changing position    Effect of Pain on Daily Activities can't do housework, walking as much as she used to                          [facility] Adult PT Treatment/Exercise - 10/07/16 0001      Lumbar Exercises: Standing   Heel Raises 15 reps   Heel Raises Limitations heel and toe    Forward Lunge 10 reps   Forward Lunge Limitations 4 inch box    Other Standing Lumbar Exercises forward/lateral step ups 4 inch box 1x10 each      Lumbar Exercises: Supine   Ab Set 15 reps;5 seconds   Bent Knee Raise 15 reps;5 seconds   Bent Knee  Raise Limitations with core set    Bridge 15 reps   Other Supine Lumbar Exercises supine hip ABD with red TB 1x10 each      Lumbar Exercises: Sidelying   Clam 15 reps   Clam Limitations green TB      Lumbar Exercises: Prone   Straight Leg Raise 10 reps   Straight Leg Raises Limitations knee bent              Balance Exercises - 10/07/16 0936      Balance Exercises: Standing   Tandem Stance Eyes open;Foam/compliant surface;3 reps;15 secs   Tandem Gait Forward;4 reps;Other (comment)  in // bars    Other Standing Exercises SLS with standing hip flexion 1x10 no UES   on foam            PT Education - 10/07/16 1006    Education provided Yes   Education Details updated HEP    Person(s) Educated Patient   Methods Explanation   Comprehension Verbalized understanding          PT Short Term Goals - 09/27/16 1042      PT SHORT TERM GOAL #1   Title Patient to experience no more than 4/10 pain at worst in order to improve QOL and general activity tolerance    Time 3   Period Weeks   Status New     PT SHORT TERM GOAL #2   Title Patient to be able to ambulate at least 743ft during 3MWT in order to show improved general mobility and community access    Time 3   Period Weeks   Status New     PT SHORT TERM GOAL #3   Title Patient to be independent in correctly and consistently performing appropriate HEP, to be updated PRN    Time 1   Period Weeks   Status New           PT Long Term Goals - 09/27/16 1043      PT LONG TERM GOAL #1   Title Patient to demonstrate strength 5/5 in all tested muscle groups in order to assist in improving functional task performance and tolerance    Time 6   Period Weeks   Status New     PT LONG TERM GOAL #2   Title Patient to be able to maintain SLS for at least  20 seconds on each LE in order to show improved stability and reduced fall risk    Time 6   Period Weeks   Status New     PT LONG TERM GOAL #3   Title Patient to be  able to ambulate up/down inclines/declines with no exacerbation of pain or fatigue in order to improve general access to community    Time 6   Period Weeks   Status New     PT LONG TERM GOAL #4   Title Patient to be participatory in appropriate regular aerobic activity program, at least 20 minutes in duration and at least 4 days per week, in order to maintain functional gains and improve general health status    Time 6   Period Weeks   Status New               Plan - 10/07/16 1006    Clinical Impression Statement Continued with functional strengthening for most of session, progressing reps and difficulty as appropriate; patient requests update to HEP as she feels her current version is quite easy, she has been very compliant with it thus far. Introduced CKC exercises this session with good tolerance but requiring cues for form due to tendency for significant knee valgus moment with lunge/squat activity. Finished session with ongoing functional balance tasks.    Rehab Potential Good   Clinical Impairments Affecting Rehab Potential extensive lumbar surgical history, fibromyalgia    PT Frequency 2x / week   PT Duration 6 weeks   PT Treatment/Interventions ADLs/Self Care Home Management;Biofeedback;Cryotherapy;Moist Heat;DME Instruction;Gait training;Stair training;Functional mobility training;Therapeutic activities;Therapeutic exercise;Balance training;Neuromuscular re-education;Patient/family education;Manual techniques;Scar mobilization;Passive range of motion;Energy conservation;Taping   PT Next Visit Plan Continue session focus on improving functional strengthening, balance primarily. AVOID BENDING AND LIFTING OVER 5-10# PER MD PRECAUTIONS.    PT Home Exercise Plan 10/23: eccentric sit to stand, bridges, tandem stance 09/30/2016: abdominal isometrics 11/2: tandem gait, sidelying clams with green TB, bent knee prone hip extension    Consulted and Agree with Plan of Care Patient       Patient will benefit from skilled therapeutic intervention in order to improve the following deficits and impairments:  Abnormal gait, Improper body mechanics, Pain, Decreased coordination, Decreased mobility, Postural dysfunction, Decreased activity tolerance, Decreased range of motion, Decreased strength, Hypomobility, Decreased balance, Difficulty walking, Impaired flexibility  Visit Diagnosis: Chronic low back pain, unspecified back pain laterality, with sciatica presence unspecified  Muscle weakness (generalized)  Difficulty in walking, not elsewhere classified  Unsteadiness on feet     Problem List Patient Active Problem List   Diagnosis Date Noted  . Obesity 10/06/2016  . Lumbar foraminal stenosis 06/04/2016  . Carpal tunnel syndrome, bilateral 02/07/2014  . S/P lumbar spinal fusion 02/07/2014  . Obstructive sleep apnea 02/04/2014  . Hand arthritis 10/15/2013  . HTN (hypertension), benign 08/24/2013  . Hypertriglyceridemia 08/24/2013  . Iliotibial band tendonitis 02/08/2013  . Trigger finger, acquired 02/08/2013  . Low back pain 06/22/2011  . HNP (herniated nucleus pulposus) 06/22/2011  . HIP PAIN, RIGHT 12/15/2010  . LUMBOSACRAL SPONDYLOSIS WITHOUT MYELOPATHY 12/15/2010  . SPINAL STENOSIS, LUMBAR 12/15/2010  . KNEE REPLACEMENT, HX OF 02/26/2010  . DERANGEMENT MENISCUS 07/03/2009  . KNEE, ARTHRITIS, DEGEN./OSTEO 05/22/2009  . KNEE PAIN 05/22/2009  . ANSERINE BURSITIS 05/22/2009  . TRIGGER FINGER 09/23/2008    Deniece Ree PT, DPT [phone]  [facility] [address] [place], [place], [address] Phone: [phone]   Fax:  [phone]  Name: Emily [name] MRN: [id number] Date of Birth: 1951/03/10

## 2016-10-07 NOTE — Patient Instructions (Signed)
   TANDEM WALK (PERFORM IN HALLWAY)  Stand with one foot directly in front of the other so that the toes of one foot touches the heel of the other as shown in the image.   Progress by taking steps with your heel touching your toes with each step as shown in the image.    Maintain your balance.   Repeat 3-4 lengths of your hallway, 1-2 times per day.    ELASTIC BAND - SIDELYING CLAM-   While lying on your side with your knees bent and an elastic band wrapped around your knees, draw up the top knee while keeping contact of your feet together as shown.   Do not let your pelvis roll back during the lifting movement.    Repeat 10 times each side, twice a day.     PRONE HIP EXTENSION - BENT  While lying face down with your knee bent, slowly raise up your knee off the ground. Make sure the motion is coming from your hip instead of your back muscles.   Repeat 10 times each side, twice a day.

## 2016-10-12 ENCOUNTER — Ambulatory Visit (HOSPITAL_COMMUNITY): Payer: PPO

## 2016-10-12 DIAGNOSIS — M545 Low back pain: Principal | ICD-10-CM

## 2016-10-12 DIAGNOSIS — R262 Difficulty in walking, not elsewhere classified: Secondary | ICD-10-CM

## 2016-10-12 DIAGNOSIS — G8929 Other chronic pain: Secondary | ICD-10-CM

## 2016-10-12 DIAGNOSIS — R2681 Unsteadiness on feet: Secondary | ICD-10-CM

## 2016-10-12 DIAGNOSIS — M6281 Muscle weakness (generalized): Secondary | ICD-10-CM

## 2016-10-12 NOTE — Therapy (Signed)
[place], [place], [address] Phone: [phone]   Fax:  [phone]  Physical Therapy Treatment  Patient Details  Name: Emily [name] MRN: [id number] Date of Birth: 1950/12/17 Referring Provider: Earnie Larsson   Encounter Date: 10/12/2016      PT End of Session - 10/12/16 0954    Visit Number 5   Number of Visits 12   Date for PT Re-Evaluation 10/18/16   Authorization Type [employer]    Authorization Time Period 09/17/16 to 11/08/16   Authorization - Visit Number 5   Authorization - Number of Visits 10   PT Start Time 0945   PT Stop Time [id number]   PT Time Calculation (min) 42 min   Activity Tolerance Patient tolerated treatment well;No increased pain   Behavior During Therapy WFL for tasks assessed/performed      Past Medical History:  Diagnosis Date  . Acid reflux   . Anxiety   . CHF (congestive heart failure) (HCC)    90's swelling body-no breathing problems at that time-no cardiac -in dr s Wolfgang [place] office [place] give lasix and potassium  . Complication of anesthesia    History of being told she was allergic to something in 1980 no problems since  . Depression   . Fibromyalgia   . History of blood transfusion reaction 1978  . History of hiatal hernia   . HNP (herniated nucleus pulposus)   . Hypertension    Does not see a cardiologist no stress or echo  . Joint pain   . Nerve pain    right leg   . Ruptured lumbar disc   . Sleep apnea    Uses CPAP every night setting at 7 mm/Hg    Past Surgical History:  Procedure Laterality Date  . BACK SURGERY     x2  . BLADDER REPAIR     tear during during birth of child  . CARPAL TUNNEL RELEASE Bilateral   . CESAREAN SECTION    . CHOLECYSTECTOMY     gallbladder disease; stayed sick  . COLONOSCOPY N/A 04/28/2016   Procedure: COLONOSCOPY;  Surgeon: [name], MD;  [facility];  Service: Endoscopy;  Laterality: N/A;  .  ESOPHAGOGASTRODUODENOSCOPY N/A 04/28/2016   Procedure: ESOPHAGOGASTRODUODENOSCOPY (EGD);  Surgeon: [name], MD;  [facility];  Service: Endoscopy;  Laterality: N/A;  12:00  . GALLBLADDER SURGERY    . knee right (torn Cart)    . REPLACEMENT TOTAL KNEE     left  . ROTATOR CUFF REPAIR     right  . SPINAL FUSION    . TUBAL LIGATION    . vein right leg removed    . wrist ganglion cyst left      There were no vitals filed for this visit.      Subjective Assessment - 10/12/16 0947    Subjective Pt stated she is feeling good today pain scale 3/10 Lt lower back.  Has been intergrating HEP into daily activities.  Reports of increased ease with breathing during therex as well as minimal reports of urinary incontinence   Patient Stated Goals gain strength, reduce pain, improve function    Currently in Pain? Yes   Pain Score 3    Pain Location Back   Pain Orientation Lower;Left   Pain Descriptors / Indicators Aching   Pain Type Chronic pain   Pain Radiating Towards no radicular symptoms   Pain Onset More than a  month ago   Pain Frequency Intermittent   Aggravating Factors  movement in general   Pain Relieving Factors changing position   Effect of Pain on Daily Activities can't do housework, walking as much as she used to do            [facility] Adult PT Treatment/Exercise - 10/12/16 0001      Lumbar Exercises: Standing   Heel Raises 15 reps   Heel Raises Limitations heel and toe    Functional Squats Limitations increased pain Rt knee, began wall slides for gluteal strengthening   Forward Lunge 15 reps   Forward Lunge Limitations 4 inch box    Wall Slides 10 reps;3 seconds   Other Standing Lumbar Exercises forward 1 HR 6in 15x /lateral step ups 4 inch box 1x15 each/ step down training initial 2in then 4in step height 10x each with Bil LE 2HHA   Other Standing Lumbar Exercises Ab set standing 10x 5"     Lumbar Exercises: Seated   Sit to Stand 10 reps   Sit to  Stand Limitations eccentric control with no HHA             Balance Exercises - 10/12/16 1028      Balance Exercises: Standing   Tandem Stance Eyes open;Foam/compliant surface;3 reps;30 secs  on airex with ball rotation   SLS Eyes open;3 reps  Rt 48", Lt 54" max of 3   SLS with Vectors Solid surface;3 reps  3x 5" holds with intermittent HHA Bil LE   Tandem Gait 1 rep;Forward             PT Short Term Goals - 09/27/16 1042      PT SHORT TERM GOAL #1   Title Patient to experience no more than 4/10 pain at worst in order to improve QOL and general activity tolerance    Time 3   Period Weeks   Status New     PT SHORT TERM GOAL #2   Title Patient to be able to ambulate at least 750ft during 3MWT in order to show improved general mobility and community access    Time 3   Period Weeks   Status New     PT SHORT TERM GOAL #3   Title Patient to be independent in correctly and consistently performing appropriate HEP, to be updated PRN    Time 1   Period Weeks   Status New           PT Long Term Goals - 09/27/16 1043      PT LONG TERM GOAL #1   Title Patient to demonstrate strength 5/5 in all tested muscle groups in order to assist in improving functional task performance and tolerance    Time 6   Period Weeks   Status New     PT LONG TERM GOAL #2   Title Patient to be able to maintain SLS for at least 20 seconds on each LE in order to show improved stability and reduced fall risk    Time 6   Period Weeks   Status New     PT LONG TERM GOAL #3   Title Patient to be able to ambulate up/down inclines/declines with no exacerbation of pain or fatigue in order to improve general access to community    Time 6   Period Weeks   Status New     PT LONG TERM GOAL #4   Title Patient to be participatory in appropriate regular aerobic activity  program, at least 20 minutes in duration and at least 4 days per week, in order to maintain functional gains and improve general  health status    Time 6   Period Weeks   Status New               Plan - 10/12/16 1030    Clinical Impression Statement Session focus on improving functional strengthening; progressed balance training on dynamic surface for core strengthening and began step down training for LE strenghtening with cueing for eccentric control.  Added vector stance for balance and proximal musculature strengthening/stability.  Pt able to demonstrate majority of therex with minimal cuieng required for form and technique.  No reports of increased pain at end of session except for Rt knee pain with squats, cueing to improve form then began wall slides for gluteal strengthening with no reports of irritation to knee.       Rehab Potential Good   Clinical Impairments Affecting Rehab Potential extensive lumbar surgical history, fibromyalgia    PT Frequency 2x / week   PT Duration 6 weeks   PT Treatment/Interventions ADLs/Self Care Home Management;Biofeedback;Cryotherapy;Moist Heat;DME Instruction;Gait training;Stair training;Functional mobility training;Therapeutic activities;Therapeutic exercise;Balance training;Neuromuscular re-education;Patient/family education;Manual techniques;Scar mobilization;Passive range of motion;Energy conservation;Taping   PT Next Visit Plan Continue session focus on improving functional strengthening, balance primarily. AVOID BENDING AND LIFTING OVER 5-10# PER MD PRECAUTIONS.    PT Home Exercise Plan 10/23: eccentric sit to stand, bridges, tandem stance 09/30/2016: abdominal isometrics 11/2: tandem gait, sidelying clams with green TB, bent knee prone hip extension       Patient will benefit from skilled therapeutic intervention in order to improve the following deficits and impairments:  Abnormal gait, Improper body mechanics, Pain, Decreased coordination, Decreased mobility, Postural dysfunction, Decreased activity tolerance, Decreased range of motion, Decreased strength, Hypomobility,  Decreased balance, Difficulty walking, Impaired flexibility  Visit Diagnosis: Chronic low back pain, unspecified back pain laterality, with sciatica presence unspecified  Muscle weakness (generalized)  Difficulty in walking, not elsewhere classified  Unsteadiness on feet     Problem List Patient Active Problem List   Diagnosis Date Noted  . Obesity 10/06/2016  . Lumbar foraminal stenosis 06/04/2016  . Carpal tunnel syndrome, bilateral 02/07/2014  . S/P lumbar spinal fusion 02/07/2014  . Obstructive sleep apnea 02/04/2014  . Hand arthritis 10/15/2013  . HTN (hypertension), benign 08/24/2013  . Hypertriglyceridemia 08/24/2013  . Iliotibial band tendonitis 02/08/2013  . Trigger finger, acquired 02/08/2013  . Low back pain 06/22/2011  . HNP (herniated nucleus pulposus) 06/22/2011  . HIP PAIN, RIGHT 12/15/2010  . LUMBOSACRAL SPONDYLOSIS WITHOUT MYELOPATHY 12/15/2010  . SPINAL STENOSIS, LUMBAR 12/15/2010  . KNEE REPLACEMENT, HX OF 02/26/2010  . DERANGEMENT MENISCUS 07/03/2009  . KNEE, ARTHRITIS, DEGEN./OSTEO 05/22/2009  . KNEE PAIN 05/22/2009  . ANSERINE BURSITIS 05/22/2009  . TRIGGER FINGER 09/23/2008   Ihor [place], [place]; [place]  Aldona Lento 10/12/2016, 10:45 AM  [place], [place], [address] Phone: [phone]   Fax:  [phone]  Name: Emily [name] MRN: [id number] Date of Birth: [birth year]

## 2016-10-14 ENCOUNTER — Ambulatory Visit (HOSPITAL_COMMUNITY): Payer: PPO | Admitting: Physical Therapy

## 2016-10-14 ENCOUNTER — Telehealth (HOSPITAL_COMMUNITY): Payer: Self-pay | Admitting: Family Medicine

## 2016-10-14 NOTE — Telephone Encounter (Signed)
10/14/16 pt cx - she said that she was in pain

## 2016-10-18 ENCOUNTER — Ambulatory Visit (HOSPITAL_COMMUNITY): Payer: PPO | Admitting: Physical Therapy

## 2016-10-18 DIAGNOSIS — M545 Low back pain: Principal | ICD-10-CM

## 2016-10-18 DIAGNOSIS — G8929 Other chronic pain: Secondary | ICD-10-CM

## 2016-10-18 DIAGNOSIS — M6281 Muscle weakness (generalized): Secondary | ICD-10-CM

## 2016-10-18 DIAGNOSIS — R2681 Unsteadiness on feet: Secondary | ICD-10-CM

## 2016-10-18 DIAGNOSIS — R262 Difficulty in walking, not elsewhere classified: Secondary | ICD-10-CM

## 2016-10-18 NOTE — Therapy (Signed)
[facility] [address] [place], [place], [address] Phone: [phone]   Fax:  [phone]  Physical Therapy Treatment (Re-Assessment)  Patient Details  Name: Emily [name] MRN: [id number] Date of Birth: 05/24/1951 Referring Provider: Earnie Larsson   Encounter Date: 10/18/2016      PT End of Session - 10/18/16 1016    Visit Number 6   Number of Visits 7   Authorization Type [employer]    Authorization Time Period 09/17/16 to 11/08/16   Authorization - Visit Number 6   Authorization - Number of Visits 16   PT Start Time 0950   PT Stop Time 1028   PT Time Calculation (min) 38 min   Activity Tolerance Patient tolerated treatment well   Behavior During Therapy [facility] for tasks assessed/performed      Past Medical History:  Diagnosis Date  . Acid reflux   . Anxiety   . CHF (congestive heart failure) (HCC)    90's swelling body-no breathing problems at that time-no cardiac -in dr s Wolfgang [place] office [place] give lasix and potassium  . Complication of anesthesia    History of being told she was allergic to something in 1980 no problems since  . Depression   . Fibromyalgia   . History of blood transfusion reaction 1978  . History of hiatal hernia   . HNP (herniated nucleus pulposus)   . Hypertension    Does not see a cardiologist no stress or echo  . Joint pain   . Nerve pain    right leg   . Ruptured lumbar disc   . Sleep apnea    Uses CPAP every night setting at 7 mm/Hg    Past Surgical History:  Procedure Laterality Date  . BACK SURGERY     x2  . BLADDER REPAIR     tear during during birth of child  . CARPAL TUNNEL RELEASE Bilateral   . CESAREAN SECTION    . CHOLECYSTECTOMY     gallbladder disease; stayed sick  . COLONOSCOPY N/A 04/28/2016   Procedure: COLONOSCOPY;  Surgeon: [name], MD;  [facility];  Service: Endoscopy;  Laterality: N/A;  . ESOPHAGOGASTRODUODENOSCOPY N/A 04/28/2016   Procedure: ESOPHAGOGASTRODUODENOSCOPY (EGD);  Surgeon: [name], MD;  [facility];  Service: Endoscopy;  Laterality: N/A;  12:00  . GALLBLADDER SURGERY    . knee right (torn Cart)    . REPLACEMENT TOTAL KNEE     left  . ROTATOR CUFF REPAIR     right  . SPINAL FUSION    . TUBAL LIGATION    . vein right leg removed    . wrist ganglion cyst left      There were no vitals filed for this visit.      Subjective Assessment - 10/18/16 0951    Subjective Patient arrives today stating she feels like she is doing well, her back is feeling better but she is wondering if she is overdoing it with her knees. She can hold her balance better, she does not feel like she is going to fall. She missed last session due to pain in her knees. Getting up and out of a chair is still hard for her. She rates herself as being around 85/100 with the last 15% being her knees and getting up out of chair due to her knees.    Pertinent History HTN, OSA, history of L knee replacement, spinal stenosis, fibromyalgia, hx ruptured discs, anxiety,  history of 3 back surgeries for stensosis histy of of spinal fusion surgery L3-5   How long can you sit comfortably? 11/13- 30 minutes due to stiffness sometimes, other times it doesn't bother her    How long can you stand comfortably? 11/13- 60 minutes plus    How long can you walk comfortably? 11/13- 45 minutes    Patient Stated Goals gain strength, reduce pain, improve function    Currently in Pain? Yes   Pain Score 1    Pain Location Knee   Pain Orientation Right;Left   Pain Descriptors / Indicators Sore   Pain Type Chronic pain   Pain Radiating Towards none    Pain Onset More than a month ago   Pain Frequency Constant   Aggravating Factors  changing positions, being in one position too long, walking too long    Pain Relieving Factors exercise    Effect of Pain on Daily Activities might use lift or ramps instead of stairs             [facility] PT  Assessment - 10/18/16 0001      Assessment   Medical Diagnosis spinal stenosis    Referring Provider Earnie Larsson    Onset Date/Surgical Date 06/04/16   Next MD Visit December 4th with Dr. Annette Stable    Prior Therapy she had some PT a long time ago, after her first surgery      Precautions   Precaution Comments cannot bend or lift more than 5-10#      Balance Screen   Has the patient fallen in the past 6 months No   Has the patient had a decrease in activity level because of a fear of falling?  Yes   Is the patient reluctant to leave their home because of a fear of falling?  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      Strength   Right Hip Flexion 5/5   Right Hip Extension 3/5   Right Hip ABduction 3+/5   Left Hip Flexion 4+/5   Left Hip Extension 3/5   Left Hip ABduction 4-/5   Right Knee Flexion 4+/5   Right Knee Extension 5/5   Left Knee Flexion 4+/5   Left Knee Extension 5/5   Right Ankle Dorsiflexion 5/5   Left Ankle Dorsiflexion 5/5     Flexibility   Hamstrings WFL    Piriformis min limitation      6 minute walk test results    Aerobic Endurance Distance Walked 622   Endurance additional comments 3MWT      High Level Balance   High Level Balance Comments SLS 30 seconds B , stopped by PT                      [facility] Adult PT Treatment/Exercise - 10/18/16 0001      Lumbar Exercises: Supine   Bridge 20 reps   Bridge Limitations 2 sets, staggered stance    Other Supine Lumbar Exercises supine hip ABD with red TB 1x15 each      Lumbar Exercises: Sidelying   Clam 15 reps   Clam Limitations green TB      Lumbar Exercises: Prone   Straight Leg Raise 15 reps   Straight Leg Raises Limitations knee bent  Balance Exercises - 10/18/16 1028      Balance Exercises: Standing    Other Standing Exercises SLS cone based activity on various planes and heights            PT Education - 10/18/16 1028    Education provided Yes   Education Details progress with skilled PT services, POC    Person(s) Educated Patient   Methods Explanation   Comprehension Verbalized understanding          PT Short Term Goals - 10/18/16 1008      PT SHORT TERM GOAL #1   Title Patient to experience no more than 4/10 pain at worst in order to improve QOL and general activity tolerance    Baseline 11/13- "going great", pain gets to 3/10 at worst    Time 3   Period Weeks   Status Achieved     PT SHORT TERM GOAL #2   Title Patient to be able to ambulate at least 779f during 3MWT in order to show improved general mobility and community access    Baseline 11/13- 6269f   Time 3   Period Weeks   Status On-going     PT SHORT TERM GOAL #3   Title Patient to be independent in correctly and consistently performing appropriate HEP, to be updated PRN    Baseline 11/13- reports compliance   Time 1   Period Weeks   Status Achieved           PT Long Term Goals - 10/18/16 1009      PT LONG TERM GOAL #1   Title Patient to demonstrate strength 5/5 in all tested muscle groups in order to assist in improving functional task performance and tolerance    Time 6   Period Weeks   Status Partially Met     PT LONG TERM GOAL #2   Title Patient to be able to maintain SLS for at least 20 seconds on each LE in order to show improved stability and reduced fall risk    Baseline 11/13- 30 seconds B    Time 6   Period Weeks   Status Achieved     PT LONG TERM GOAL #3   Title Patient to be able to ambulate up/down inclines/declines with no exacerbation of pain or fatigue in order to improve general access to community    Baseline 11/13- reports no functional difficulty with this    Time 6   Period Weeks   Status Achieved     PT LONG TERM GOAL #4   Title Patient to be participatory in  appropriate regular aerobic activity program, at least 20 minutes in duration and at least 4 days per week, in order to maintain functional gains and improve general health status    Baseline 11/13- discussed/educated today    Time 6   Period Weeks   Status On-going               Plan - 10/18/16 1016    Clinical Impression Statement Re-assessment performed today. Patient is making excellent objective and subjective progress towards all goals and reports that she really has very few functional limitations related to her low back at this point- her knees are more of a concern. She is agreeable to one more session for focus on development of advanced HEP/activity recommendations before DC to independent exercise program at [facility].    Rehab Potential Good   Clinical Impairments Affecting Rehab Potential extensive lumbar surgical history,  fibromyalgia    PT Frequency Other (comment)  one more session    PT Duration Other (comment)  one more session    PT Treatment/Interventions ADLs/Self Care Home Management;Biofeedback;Cryotherapy;Moist Heat;DME Instruction;Gait training;Stair training;Functional mobility training;Therapeutic activities;Therapeutic exercise;Balance training;Neuromuscular re-education;Patient/family education;Manual techniques;Scar mobilization;Passive range of motion;Energy conservation;Taping   PT Next Visit Plan one final session to focus on advanced HEP/activity recommendations before DC . AVOID BENDING/LIFTING OVER 5-10# PER MD.    PT Home Exercise Plan 10/23: eccentric sit to stand, bridges, tandem stance 09/30/2016: abdominal isometrics 11/2: tandem gait, sidelying clams with green TB, bent knee prone hip extension    Consulted and Agree with Plan of Care Patient      Patient will benefit from skilled therapeutic intervention in order to improve the following deficits and impairments:  Abnormal gait, Improper body mechanics, Pain, Decreased coordination,  Decreased mobility, Postural dysfunction, Decreased activity tolerance, Decreased range of motion, Decreased strength, Hypomobility, Decreased balance, Difficulty walking, Impaired flexibility  Visit Diagnosis: Chronic low back pain, unspecified back pain laterality, with sciatica presence unspecified  Muscle weakness (generalized)  Difficulty in walking, not elsewhere classified  Unsteadiness on feet       G-Codes - Nov 11, 2016 1029    Functional Assessment Tool Used Based on skilled clinical assessment of strength, gait, balance, pain patterns    Functional Limitation Mobility: Walking and moving around   Mobility: Walking and Moving Around Current Status ([id number]) At least 1 percent but less than 20 percent impaired, limited or restricted   Mobility: Walking and Moving Around Goal Status [phone]) 0 percent impaired, limited or restricted      Problem List Patient Active Problem List   Diagnosis Date Noted  . Obesity 10/06/2016  . Lumbar foraminal stenosis 06/04/2016  . Carpal tunnel syndrome, bilateral 02/07/2014  . S/P lumbar spinal fusion 02/07/2014  . Obstructive sleep apnea 02/04/2014  . Hand arthritis 10/15/2013  . HTN (hypertension), benign 08/24/2013  . Hypertriglyceridemia 08/24/2013  . Iliotibial band tendonitis 02/08/2013  . Trigger finger, acquired 02/08/2013  . Low back pain 06/22/2011  . HNP (herniated nucleus pulposus) 06/22/2011  . HIP PAIN, RIGHT 12/15/2010  . LUMBOSACRAL SPONDYLOSIS WITHOUT MYELOPATHY 12/15/2010  . SPINAL STENOSIS, LUMBAR 12/15/2010  . KNEE REPLACEMENT, HX OF 02/26/2010  . DERANGEMENT MENISCUS 07/03/2009  . KNEE, ARTHRITIS, DEGEN./OSTEO 05/22/2009  . KNEE PAIN 05/22/2009  . ANSERINE BURSITIS 05/22/2009  . TRIGGER FINGER 09/23/2008    Deniece Ree PT, DPT [phone]  [facility] [address] [place], [place], [address] Phone: [phone]   Fax:  [phone]  Name: EMMERSON SHUFFIELD MRN:  [id number] Date of Birth: [birth year]

## 2016-10-20 ENCOUNTER — Ambulatory Visit (HOSPITAL_COMMUNITY): Payer: PPO | Admitting: Physical Therapy

## 2016-10-20 DIAGNOSIS — M545 Low back pain: Principal | ICD-10-CM

## 2016-10-20 DIAGNOSIS — G8929 Other chronic pain: Secondary | ICD-10-CM

## 2016-10-20 DIAGNOSIS — M6281 Muscle weakness (generalized): Secondary | ICD-10-CM

## 2016-10-20 DIAGNOSIS — R262 Difficulty in walking, not elsewhere classified: Secondary | ICD-10-CM

## 2016-10-20 DIAGNOSIS — R2681 Unsteadiness on feet: Secondary | ICD-10-CM

## 2016-10-20 NOTE — Therapy (Signed)
[facility] [address] [place], [place], [address] Phone: [phone]   Fax:  [phone]  Physical Therapy Treatment (Discharge)  Patient Details  Name: Emily [name] MRN: [id number] Date of Birth: 09/10/1951 Referring Provider: Earnie Larsson   Encounter Date: 10/20/2016      PT End of Session - 10/20/16 1025    Visit Number 7   Number of Visits 7   Authorization Type [employer]    Authorization Time Period 09/17/16 to 11/08/16   Authorization - Visit Number 7   Authorization - Number of Visits 16   PT Start Time 0277   PT Stop Time 1025   PT Time Calculation (min) 30 min   Activity Tolerance Patient tolerated treatment well   Behavior During Therapy [facility] for tasks assessed/performed      Past Medical History:  Diagnosis Date  . Acid reflux   . Anxiety   . CHF (congestive heart failure) (HCC)    90's swelling body-no breathing problems at that time-no cardiac -in dr s Wolfgang [place] office  give lasix and potassium  . Complication of anesthesia    History of being told she was allergic to something in 1980 no problems since  . Depression   . Fibromyalgia   . History of blood transfusion reaction 1978  . History of hiatal hernia   . HNP (herniated nucleus pulposus)   . Hypertension    Does not see a cardiologist no stress or echo  . Joint pain   . Nerve pain    right leg   . Ruptured lumbar disc   . Sleep apnea    Uses CPAP every night setting at 7 mm/Hg    Past Surgical History:  Procedure Laterality Date  . BACK SURGERY     x2  . BLADDER REPAIR     tear during during birth of child  . CARPAL TUNNEL RELEASE Bilateral   . CESAREAN SECTION    . CHOLECYSTECTOMY     gallbladder disease; stayed sick  . COLONOSCOPY N/A 04/28/2016   Procedure: COLONOSCOPY;  Surgeon: [name], MD;  Location: [facility];  Service: Endoscopy;  Laterality: N/A;  . ESOPHAGOGASTRODUODENOSCOPY N/A 04/28/2016   Procedure: ESOPHAGOGASTRODUODENOSCOPY (EGD);  Surgeon: [name], MD;  Location: [facility];  Service: Endoscopy;  Laterality: N/A;  12:00  . GALLBLADDER SURGERY    . knee right (torn Cart)    . REPLACEMENT TOTAL KNEE     left  . ROTATOR CUFF REPAIR     right  . SPINAL FUSION    . TUBAL LIGATION    . vein right leg removed    . wrist ganglion cyst left      There were no vitals filed for this visit.      Subjective Assessment - 10/20/16 1030    Subjective Pt states she is feeling good today without pain, other than chronic Rt knee pain.  Pt states she is ready for discharge.   Currently in Pain? No/denies                         [facility] Adult PT Treatment/Exercise - 10/20/16 0001      Lumbar Exercises: Standing   Heel Raises 15 reps   Heel Raises Limitations heel and toe    Forward Lunge 15 reps   Forward Lunge Limitations 4 inch box    Wall Slides 10 reps;3 seconds   Other Standing  Lumbar Exercises postural 3 with RTB 10 reps each     Lumbar Exercises: Seated   Sit to Stand 10 reps   Sit to Stand Limitations eccentric control with no HHA                PT Education - 10/20/16 1024    Education provided Yes   Education Details updated HEP to include functional LE and postural strengthening. Minimal cues needed following demonstration.   Person(s) Educated Patient   Methods Explanation;Demonstration;Tactile cues;Handout   Comprehension Verbalized understanding;Returned demonstration          PT Short Term Goals - 10/20/16 1032      PT SHORT TERM GOAL #1   Title Patient to experience no more than 4/10 pain at worst in order to improve QOL and general activity tolerance    Baseline 11/13- "going great", pain gets to 3/10 at worst    Time 3   Period Weeks   Status Achieved     PT SHORT TERM GOAL #2   Title Patient to be able to ambulate at least 710f during 3MWT in order to show improved general mobility and community access     Baseline 11/13- 6270f   Time 3   Period Weeks   Status On-going     PT SHORT TERM GOAL #3   Title Patient to be independent in correctly and consistently performing appropriate HEP, to be updated PRN    Baseline 11/13- reports compliance   Time 1   Period Weeks   Status Achieved           PT Long Term Goals - 10/20/16 1032      PT LONG TERM GOAL #1   Title Patient to demonstrate strength 5/5 in all tested muscle groups in order to assist in improving functional task performance and tolerance    Time 6   Period Weeks   Status Partially Met     PT LONG TERM GOAL #2   Title Patient to be able to maintain SLS for at least 20 seconds on each LE in order to show improved stability and reduced fall risk    Baseline 11/13- 30 seconds B    Time 6   Period Weeks   Status Achieved     PT LONG TERM GOAL #3   Title Patient to be able to ambulate up/down inclines/declines with no exacerbation of pain or fatigue in order to improve general access to community    Baseline 11/13- reports no functional difficulty with this    Time 6   Period Weeks   Status Achieved     PT LONG TERM GOAL #4   Title Patient to be participatory in appropriate regular aerobic activity program, at least 20 minutes in duration and at least 4 days per week, in order to maintain functional gains and improve general health status    Baseline 11/13- discussed/educated today    Time 6   Period Weeks   Status Achieved               Plan - 10/20/16 1026    Clinical Impression Statement Completed advanced HEP update per evaluating therapist POC.  Pt able to demonstrate correctly and safely.  No pain reported with prescribed therex.  Pt plans to have Rt TKR surgery in the near future.  Pt now ready for discharge to independent level of care and [facility].    Rehab Potential Good   Clinical Impairments  Affecting Rehab Potential extensive lumbar surgical history, fibromyalgia    PT  Frequency Other (comment)  one more session    PT Duration Other (comment)  one more session    PT Treatment/Interventions ADLs/Self Care Home Management;Biofeedback;Cryotherapy;Moist Heat;DME Instruction;Gait training;Stair training;Functional mobility training;Therapeutic activities;Therapeutic exercise;Balance training;Neuromuscular re-education;Patient/family education;Manual techniques;Scar mobilization;Passive range of motion;Energy conservation;Taping   PT Next Visit Plan Ready for discharge per evaluating therapist instruction.   PT Home Exercise Plan 10/23: eccentric sit to stand, bridges, tandem stance 09/30/2016: abdominal isometrics 11/2: tandem gait, sidelying clams with green TB, bent knee prone hip extension Nov 04, 2023:  heel and toe raises, wall squats, forward lunge onto step, sit to stands, postural strengthening with theraband.   Consulted and Agree with Plan of Care Patient      Patient will benefit from skilled therapeutic intervention in order to improve the following deficits and impairments:  Abnormal gait, Improper body mechanics, Pain, Decreased coordination, Decreased mobility, Postural dysfunction, Decreased activity tolerance, Decreased range of motion, Decreased strength, Hypomobility, Decreased balance, Difficulty walking, Impaired flexibility  Visit Diagnosis: Chronic low back pain, unspecified back pain laterality, with sciatica presence unspecified  Muscle weakness (generalized)  Difficulty in walking, not elsewhere classified  Unsteadiness on feet    Problem List Patient Active Problem List   Diagnosis Date Noted  . Obesity 10/06/2016  . Lumbar foraminal stenosis 06/04/2016  . Carpal tunnel syndrome, bilateral 02/07/2014  . S/P lumbar spinal fusion 02/07/2014  . Obstructive sleep apnea 02/04/2014  . Hand arthritis 10/15/2013  . HTN (hypertension), benign 08/24/2013  . Hypertriglyceridemia 08/24/2013  . Iliotibial band tendonitis 02/08/2013  . Trigger  finger, acquired 02/08/2013  . Low back pain 06/22/2011  . HNP (herniated nucleus pulposus) 06/22/2011  . HIP PAIN, RIGHT 12/15/2010  . LUMBOSACRAL SPONDYLOSIS WITHOUT MYELOPATHY 12/15/2010  . SPINAL STENOSIS, LUMBAR 12/15/2010  . KNEE REPLACEMENT, HX OF 02/26/2010  . DERANGEMENT MENISCUS 07/03/2009  . KNEE, ARTHRITIS, DEGEN./OSTEO 05/22/2009  . KNEE PAIN 05/22/2009  . ANSERINE BURSITIS 05/22/2009  . TRIGGER FINGER 09/23/2008    Teena Irani, PTA/CLT [phone]      G-Codes - 11-03-16 1628    Functional Assessment Tool Used Based on skilled clinical assessment of strength, gait, balance, pain patterns    Functional Limitation Mobility: Walking and moving around   Mobility: Walking and Moving Around Goal Status [phone]) 0 percent impaired, limited or restricted   Mobility: Walking and Moving Around Discharge Status [phone]) At least 1 percent but less than 20 percent impaired, limited or restricted      PHYSICAL THERAPY DISCHARGE SUMMARY  Visits from Start of Care: 7  Current functional level related to goals / functional outcomes: Patient at a high level of function and currently her primary complaints are related to knee pain rather than her back; she has been compliant with skilled PT services and HEP and has achieved the majority of her goals. DC today due to high level of function.    Remaining deficits: Majority of limitations related to chronic knee pain    Education / Equipment: Advanced HEP  Plan: Patient agrees to discharge.  Patient goals were met. Patient is being discharged due to meeting the stated rehab goals.  ?????     Deniece Ree PT, [place] [phone]   2016/11/03, 4:31 PM  [facility] [place], [place], [address] Phone: [phone]   Fax:  [phone]  Name: Emily [name] MRN: [id number] Date of Birth: 05/09/1951

## 2016-10-20 NOTE — Patient Instructions (Signed)
Bracing With Forward Lunge (Standing)    Stand with hands on hips. Find neutral spine. Tighten pelvic floor and abdominals and hold. Alternating legs, step forward and bend knee to lower trunk. Repeat _10__ times. Do _2__ times a day.  Do on a step and make sure your knee doesn't pass your toes  Quad Strength: Quarter Squat    With feet shoulder-width apart and back against wall, slide down wall until knees are at 30-45. Return. Repeat __15__ times. Do _2___ sessions per day. CAUTION: You should not bend knees deep enough to cause pain.  Sitting to Standing    With straight back, tighten stomach, place right leg back under chair, lean slightly forward and stand. Repeat __10__ times per set.  Do __2__ sessions per day.    Heel Raises    Stand with support. Tighten pelvic floor and hold. With knees straight, raise heels off ground.Repeat _15__ times. Do _2__ times a day.

## 2016-10-21 ENCOUNTER — Ambulatory Visit: Payer: PPO | Admitting: Nutrition

## 2016-11-08 ENCOUNTER — Ambulatory Visit (INDEPENDENT_AMBULATORY_CARE_PROVIDER_SITE_OTHER): Payer: PPO | Admitting: Family Medicine

## 2016-11-08 ENCOUNTER — Encounter: Payer: Self-pay | Admitting: Family Medicine

## 2016-11-08 VITALS — BP 132/76 | Temp 98.3°F | Ht 62.0 in | Wt 179.0 lb

## 2016-11-08 DIAGNOSIS — E784 Other hyperlipidemia: Secondary | ICD-10-CM | POA: Diagnosis not present

## 2016-11-08 DIAGNOSIS — Z23 Encounter for immunization: Secondary | ICD-10-CM

## 2016-11-08 DIAGNOSIS — J069 Acute upper respiratory infection, unspecified: Secondary | ICD-10-CM | POA: Diagnosis not present

## 2016-11-08 DIAGNOSIS — E7849 Other hyperlipidemia: Secondary | ICD-10-CM

## 2016-11-08 DIAGNOSIS — B9789 Other viral agents as the cause of diseases classified elsewhere: Secondary | ICD-10-CM

## 2016-11-08 DIAGNOSIS — M797 Fibromyalgia: Secondary | ICD-10-CM | POA: Diagnosis not present

## 2016-11-08 DIAGNOSIS — I10 Essential (primary) hypertension: Secondary | ICD-10-CM

## 2016-11-08 MED ORDER — PANTOPRAZOLE SODIUM 40 MG PO TBEC: 40.0000 mg | DELAYED_RELEASE_TABLET | Freq: Every day | ORAL | 8 refills | Status: DC

## 2016-11-08 MED ORDER — VERAPAMIL HCL ER 180 MG PO TBCR: 180.0000 mg | EXTENDED_RELEASE_TABLET | Freq: Every day | ORAL | 12 refills | Status: DC

## 2016-11-08 NOTE — Progress Notes (Signed)
   Subjective:    Patient ID: Emily [name], female    DOB: 1950-12-09, 65 y.o.   MRN: [id number]  Depression         This is a new problem.  Treatments tried: cymbalta.  Sinus headache. Started today. Denies high fever chills sweats fevers denies mucoid drainage or chest congestion This patient was put on Cymbalta because of a combination of moods but mainly because of fibromyalgia she states is helping dramatically. Less pain discomfort better energy denies sweats chills vomiting  Review of Systems  Constitutional: Negative for activity change and fever.  HENT: Positive for congestion and rhinorrhea. Negative for ear pain.   Eyes: Negative for discharge.  Respiratory: Positive for cough. Negative for shortness of breath and wheezing.   Cardiovascular: Negative for chest pain.  Psychiatric/Behavioral: Positive for depression.       Objective:   Physical Exam  Constitutional: She appears well-developed and well-nourished. No distress.  HENT:  Head: Normocephalic.  Nose: Nose normal.  Mouth/Throat: Oropharynx is clear and moist. No oropharyngeal exudate.  Neck: Neck supple.  Cardiovascular: Normal rate, regular rhythm and normal heart sounds.   No murmur heard. Pulmonary/Chest: Effort normal and breath sounds normal. No respiratory distress. She has no wheezes.  Musculoskeletal: She exhibits no edema.  Lymphadenopathy:    She has no cervical adenopathy.  Neurological: She is alert. She exhibits normal muscle tone.  Skin: Skin is warm and dry.  Psychiatric: Her behavior is normal.  Nursing note and vitals reviewed.         Assessment & Plan:  HTN good control overall continue current measures watch diet stay active Hypertriglyceridemia check lipid profile continue medication Flu shot today Viral URI Follow-up if problems

## 2016-11-10 DIAGNOSIS — I10 Essential (primary) hypertension: Secondary | ICD-10-CM | POA: Diagnosis not present

## 2016-11-10 DIAGNOSIS — Z6832 Body mass index (BMI) 32.0-32.9, adult: Secondary | ICD-10-CM | POA: Diagnosis not present

## 2016-12-10 ENCOUNTER — Ambulatory Visit (INDEPENDENT_AMBULATORY_CARE_PROVIDER_SITE_OTHER): Payer: PPO

## 2016-12-10 ENCOUNTER — Ambulatory Visit (INDEPENDENT_AMBULATORY_CARE_PROVIDER_SITE_OTHER): Payer: PPO | Admitting: Orthopedic Surgery

## 2016-12-10 ENCOUNTER — Encounter: Payer: Self-pay | Admitting: Orthopedic Surgery

## 2016-12-10 VITALS — BP 132/80 | HR 76 | Ht 62.0 in | Wt 175.0 lb

## 2016-12-10 DIAGNOSIS — M1711 Unilateral primary osteoarthritis, right knee: Secondary | ICD-10-CM

## 2016-12-10 DIAGNOSIS — Z96652 Presence of left artificial knee joint: Secondary | ICD-10-CM

## 2016-12-10 DIAGNOSIS — Z9889 Other specified postprocedural states: Secondary | ICD-10-CM | POA: Diagnosis not present

## 2016-12-10 DIAGNOSIS — G8929 Other chronic pain: Secondary | ICD-10-CM

## 2016-12-10 DIAGNOSIS — M25561 Pain in right knee: Secondary | ICD-10-CM

## 2016-12-10 NOTE — Patient Instructions (Addendum)
You have received an injection of steroids into the joint. 15% of patients will have increased pain within the 24 hours postinjection.   This is transient and will go away.   We recommend that you use ice packs on the injection site for 20 minutes every 2 hours and extra strength Tylenol 2 tablets every 8 as needed until the pain resolves.  If you continue to have pain after taking the Tylenol and using the ice please call the office for further instructions.    Osteoarthritis Osteoarthritis is a type of arthritis that affects tissue that covers the ends of bones in joints (cartilage). Cartilage acts as a cushion between the bones and helps them move smoothly. Osteoarthritis results when cartilage in the joints gets worn down. Osteoarthritis is sometimes called "wear and tear" arthritis. Osteoarthritis is the most common form of arthritis. It often occurs in older people. It is a condition that gets worse over time (a progressive condition). Joints that are most often affected by this condition are in:  Fingers.  Toes.  Hips.  Knees.  Spine, including neck and lower back. What are the causes? This condition is caused by age-related wearing down of cartilage that covers the ends of bones. What increases the risk? The following factors may make you more likely to develop this condition:  Older age.  Being overweight or obese.  Overuse of joints, such as in athletes.  Past injury of a joint.  Past surgery on a joint.  Family history of osteoarthritis. What are the signs or symptoms? The main symptoms of this condition are pain, swelling, and stiffness in the joint. The joint may lose its shape over time. Small pieces of bone or cartilage may break off and float inside of the joint, which may cause more pain and damage to the joint. Small deposits of bone (osteophytes) may grow on the edges of the joint. Other symptoms may include:  A grating or scraping feeling inside the joint  when you move it.  Popping or creaking sounds when you move. Symptoms may affect one or more joints. Osteoarthritis in a major joint, such as your knee or hip, can make it painful to walk or exercise. If you have osteoarthritis in your hands, you might not be able to grip items, twist your hand, or control small movements of your hands and fingers (fine motor skills). How is this diagnosed? This condition may be diagnosed based on:  Your medical history.  A physical exam.  Your symptoms.  X-rays of the affected joint(s).  Blood tests to rule out other types of arthritis. How is this treated? There is no cure for this condition, but treatment can help to control pain and improve joint function. Treatment plans may include:  A prescribed exercise program that allows for rest and joint relief. You may work with a physical therapist.  A weight control plan.  Pain relief techniques, such as:  Applying heat and cold to the joint.  Electric pulses delivered to nerve endings under the skin (transcutaneous electrical nerve stimulation, or TENS).  Massage.  Certain nutritional supplements.  NSAIDs or prescription medicines to help relieve pain.  Medicine to help relieve pain and inflammation (corticosteroids). This can be given by mouth (orally) or as an injection.  Assistive devices, such as a brace, wrap, splint, specialized glove, or cane.  Surgery, such as:  An osteotomy. This is done to reposition the bones and relieve pain or to remove loose pieces of bone and cartilage.    Joint replacement surgery. You may need this surgery if you have very bad (advanced) osteoarthritis. Follow these instructions at home: Activity   Rest your affected joints as directed by your health care provider.  Do not drive or use heavy machinery while taking prescription pain medicine.  Exercise as directed. Your health care provider or physical therapist may recommend specific types of exercise,  such as:  Strengthening exercises. These are done to strengthen the muscles that support joints that are affected by arthritis. They can be performed with weights or with exercise bands to add resistance.  Aerobic activities. These are exercises, such as brisk walking or water aerobics, that get your heart pumping.  Range-of-motion activities. These keep your joints easy to move.  Balance and agility exercises. Managing pain, stiffness, and swelling   If directed, apply heat to the affected area as often as told by your health care provider. Use the heat source that your health care provider recommends, such as a moist heat pack or a heating pad.  If you have a removable assistive device, remove it as told by your health care provider.  Place a towel between your skin and the heat source. If your health care provider tells you to keep the assistive device on while you apply heat, place a towel between the assistive device and the heat source.  Leave the heat on for 20-30 minutes.  Remove the heat if your skin turns bright red. This is especially important if you are unable to feel pain, heat, or cold. You may have a greater risk of getting burned.  If directed, put ice on the affected joint:  If you have a removable assistive device, remove it as told by your health care provider.  Put ice in a plastic bag.  Place a towel between your skin and the bag. If your health care provider tells you to keep the assistive device on during icing, place a towel between the assistive device and the bag.  Leave the ice on for 20 minutes, 2-3 times a day. General instructions   Take over-the-counter and prescription medicines only as told by your health care provider.  Maintain a healthy weight. Follow instructions from your health care provider for weight control. These may include dietary restrictions.  Do not use any products that contain nicotine or tobacco, such as cigarettes and  e-cigarettes. These can delay bone healing. If you need help quitting, ask your health care provider.  Use assistive devices as directed by your health care provider.  Keep all follow-up visits as told by your health care provider. This is important. Where to find more information:  National Institute of Arthritis and Musculoskeletal and Skin Diseases: www.niams.nih.gov  National Institute on Aging: www.nia.nih.gov  American College of Rheumatology: www.rheumatology.org Contact a health care provider if:  Your skin turns red.  You develop a rash.  You have pain that gets worse.  You have a fever along with joint or muscle aches. Get help right away if:  You lose a lot of weight.  You suddenly lose your appetite.  You have night sweats. Summary  Osteoarthritis is a type of arthritis that affects tissue covering the ends of bones in joints (cartilage).  This condition is caused by age-related wearing down of cartilage that covers the ends of bones.  The main symptom of this condition is pain, swelling, and stiffness in the joint.  There is no cure for this condition, but treatment can help to control pain and improve   joint function. This information is not intended to replace advice given to you by your health care provider. Make sure you discuss any questions you have with your health care provider. Document Released: 11/22/2005 Document Revised: 07/26/2016 Document Reviewed: 07/26/2016 Elsevier Interactive Patient Education  2017 [employer]  

## 2016-12-10 NOTE — Progress Notes (Signed)
Patient ID: Emily [name], female   DOB: [birth year], 66 y.o.   MRN: [id number]  Chief Complaint  Patient presents with  . Follow-up    right knee pain    HPI Emily [name] is a 66 y.o. female.   66 year old female status post left total knee did well, status post lumbar fusion with some residual anterior and lateral right thigh numbness presents for evaluation of her right knee with medial and lateral pain crepitance times several years with recent increase in pain after slipping sensation felt in the knee when she got up from a seated position. She has lateral pain and posterior pain  The pain is aching and dull and related to weightbearing  Review of Systems Review of Systems  Constitutional: Negative for activity change, appetite change and fever.  Gastrointestinal: Negative for blood in stool.  Musculoskeletal: Positive for back pain, gait problem, joint swelling and myalgias.  Neurological: Positive for numbness. Negative for light-headedness.    Past Medical History:  Diagnosis Date  . Acid reflux   . Anxiety   . CHF (congestive heart failure) (HCC)    90's swelling body-no breathing problems at that time-no cardiac -in dr s Wolfgang Phoenix office [place] give lasix and potassium  . Complication of anesthesia    History of being told she was allergic to something in 1980 no problems since  . Depression   . Fibromyalgia   . History of blood transfusion reaction 1978  . History of hiatal hernia   . HNP (herniated nucleus pulposus)   . Hypertension    Does not see a cardiologist no stress or echo  . Joint pain   . Nerve pain    right leg   . Ruptured lumbar disc   . Sleep apnea    Uses CPAP every night setting at 7 mm/Hg    Past Surgical History:  Procedure Laterality Date  . BACK SURGERY     x2  . BLADDER REPAIR     tear during during birth of child  . CARPAL TUNNEL RELEASE Bilateral   . CESAREAN SECTION    . CHOLECYSTECTOMY     gallbladder disease; stayed sick   . COLONOSCOPY N/A 04/28/2016   Procedure: COLONOSCOPY;  Surgeon: [name], MD;  Location: [facility];  Service: Endoscopy;  Laterality: N/A;  . ESOPHAGOGASTRODUODENOSCOPY N/A 04/28/2016   Procedure: ESOPHAGOGASTRODUODENOSCOPY (EGD);  Surgeon: [name], MD;  Location: [facility];  Service: Endoscopy;  Laterality: N/A;  12:00  . GALLBLADDER SURGERY    . knee right (torn Cart)    . REPLACEMENT TOTAL KNEE     left  . ROTATOR CUFF REPAIR     right  . SPINAL FUSION    . TUBAL LIGATION    . vein right leg removed    . wrist ganglion cyst left      Social History Social History  Substance Use Topics  . Smoking status: Never Smoker  . Smokeless tobacco: Never Used  . Alcohol use No    Allergies  Allergen Reactions  . Codeine Itching  . Oxycodone-Acetaminophen Itching  . Sulfamethoxazole-Trimethoprim Hives  . Celebrex [Celecoxib] Other (See Comments)    Homicidal thoughts.  . Hydrocodone-Acetaminophen Itching    Takes with benadryl  . Losartan     Skin on arms got loose  . Lyrica [Pregabalin]     When combined with narcotics will cause SpO2 to drop  . Tramadol Itching    Current Meds  Medication  Sig  . acetaminophen (TYLENOL) 500 MG tablet Take 1,000 mg by mouth 3 (three) times daily as needed for moderate pain.   . cyclobenzaprine (FLEXERIL) 10 MG tablet q hs prn  . diazepam (VALIUM) 5 MG tablet Take 5 mg by mouth every 6 (six) hours as needed for anxiety.  . DULoxetine (CYMBALTA) 30 MG capsule Take 1 capsule (30 mg total) by mouth daily.  Marland Kitchen Fexofenadine HCl (ALLEGRA PO) Take 1 tablet by mouth daily as needed (allergies).   Marland Kitchen gemfibrozil (LOPID) 600 MG tablet Take 1 tablet (600 mg total) by mouth 2 (two) times daily before a meal.  . hydrochlorothiazide (HYDRODIURIL) 25 MG tablet Take 1 tablet (25 mg total) by mouth daily.  . pantoprazole (PROTONIX) 40 MG tablet Take 1 tablet (40 mg total) by mouth daily.  . potassium chloride SA (KLOR-CON M20) 20 MEQ  tablet Take 1 tablet (20 mEq total) by mouth daily.  . verapamil (CALAN-SR) 180 MG CR tablet Take 1 tablet (180 mg total) by mouth daily.      Physical Exam Physical Exam BP 132/80   Pulse 76   Ht 5\' 2"  (1.575 m)   Wt 175 lb (79.4 kg)   BMI 32.01 kg/m   Gen. appearance. The patient is well-developed and well-nourished, grooming and hygiene are normal. There are no gross congenital abnormalities  The patient is alert and oriented to person place and time  Mood and affect are normal  Ambulation Ambulation is without significant limp or supportive device  Examination reveals the following: On inspection we find right knee crepitance on range of motion lateral joint line tenderness mild effusion  With the range of motion of  120 with full extension  Stability tests were normal  in the anterior cruciate ligament PCL and collateral ligaments  Strength tests revealed grade 5 motor strength  Skin we find no rash ulceration or erythema  Sensation decreased sensation to light touch over the anterior and lateral proximal right thigh  Impression vascular system shows no peripheral edema  On the left side she has an anterior incision from a left total knee no swelling flexion 120 stability normal strength normal  Data Reviewed X-rays today show valgus arthritis 75% loss of joint space secondary bone changes of sclerosis and osteophyte formation  Assessment    Osteoarthritis right knee not end-stage on treated by nonoperative means She has several allergies so NSAIDs are probably not an option for [place] at this time she is also allergic to hydrocodone and codeine and when she has surgery she has to take Dilaudid  She takes some Cymbalta and Tylenol for her current pain so we will just inject the knee at this time and then x-ray in 6 months  Encounter Diagnoses  Name Primary?  . Chronic pain of right knee Yes  . Arthritis of knee, right   . History of total left knee replacement    . History of lumbar surgery        Plan    Procedure note right knee injection verbal consent was obtained to inject right knee joint  Timeout was completed to confirm the site of injection  The medications used were 40 mg of Depo-Medrol and 1% lidocaine 3 cc  Anesthesia was provided by ethyl chloride and the skin was prepped with alcohol.  After cleaning the skin with alcohol a 20-gauge needle was used to inject the right knee joint. There were no complications. A sterile bandage was applied.  RETURN IN 6 MONTHS  XRAYS RT KNEE       Arther Abbott 12/10/2016, 10:26 AM

## 2016-12-13 ENCOUNTER — Encounter: Payer: PPO | Attending: Family Medicine | Admitting: Nutrition

## 2016-12-13 VITALS — Ht 62.0 in | Wt 177.0 lb

## 2016-12-13 DIAGNOSIS — Z6832 Body mass index (BMI) 32.0-32.9, adult: Secondary | ICD-10-CM | POA: Insufficient documentation

## 2016-12-13 DIAGNOSIS — E669 Obesity, unspecified: Secondary | ICD-10-CM | POA: Diagnosis not present

## 2016-12-13 DIAGNOSIS — Z713 Dietary counseling and surveillance: Secondary | ICD-10-CM | POA: Insufficient documentation

## 2016-12-13 DIAGNOSIS — R7309 Other abnormal glucose: Secondary | ICD-10-CM

## 2016-12-13 DIAGNOSIS — E781 Pure hyperglyceridemia: Secondary | ICD-10-CM

## 2016-12-13 NOTE — Patient Instructions (Signed)
Goals 1. Follow My Plate 2. Measure foods out 3. Eat 2-3 carbs per meals 4. Increase low carb vegetables. 5. Drink only water and cut out soda and tea 6. Keep exercise aim for 3-4 times per week. Avoid snacks between meals  Don't eat past 7 pm Lose 1 lb per week. Talk to Dr. Wolfgang Phoenix about getting an A1C test.

## 2016-12-13 NOTE — Progress Notes (Signed)
Medical Nutrition Therapy:  Appt start time: 0800 end time:  0930.  Assessment:  Primary concerns today: Obesity, Hyperlipidemia and Impaired fasting blood sugars. She notes she has been prediabetic since she was  66 yrs old. PMH: Hyperlipidemia. Her brother just had a massive MI and she wants to lose weight and reduce risks of HA. Has strong famiy history of MI on her mother's side. Mother died from MI in her 44's. LDL and TG elevated and low HDL. She is on cholesterol lowering medications. Usually eats 3 meals per day. She does the shopping and cooking and most foods are baked, broiled and fried some.  [facility] and wanting to increase it to 3-4 times per week.  She wants to lose 50 lbs and prevent a heart attack.  Sheduled to get her labs done this month for Dr. Wolfgang Phoenix. No A1C for reveiew.     She lives with her husband and 2 grandsons.  Diet exceeds her needs. Diet is low in fresh fruits and low carb vegetables. Needs an A1C to rule out Type 2 DM. Lipid Panel     Component Value Date/Time   CHOL 180 11/12/2015 0844   TRIG 200 (H) 11/12/2015 0844   HDL 37 (L) 11/12/2015 0844   CHOLHDL 4.9 (H) 11/12/2015 0844   CHOLHDL 4.5 08/20/2013 0741   VLDL 59 (H) 08/20/2013 0741   LDLCALC 103 (H) 11/12/2015 0844   BMET    Component Value Date/Time   NA 147 (H) 10/01/2016 0823   K 4.2 10/01/2016 0823   CL 104 10/01/2016 0823   CO2 23 10/01/2016 0823   GLUCOSE 103 (H) 10/01/2016 0823   GLUCOSE 125 (H) 06/01/2016 1430   BUN 12 10/01/2016 0823   CREATININE 0.77 10/01/2016 0823   CREATININE 0.79 08/20/2013 0741   CALCIUM 9.4 10/01/2016 0823   GFRNONAA 82 10/01/2016 0823   GFRAA 95 10/01/2016 0823   Preferred Learning Style:   No preference indicated   Learning Readiness:   Ready  Change in progress   MEDICATIONS:    DIETARY INTAKE:  24-hr recall:  B ( AM): Cherrios or raisin bran with 2% milk Snk ( AM):   L ( PM): late lunch and      White fish, shrimp, and baked potato, sweet tea and water Snk ( PM):  D ( PM): 1/2 baked potato, coke 8 oz Snk ( PM): halo orange Beverages: water, soda, or sweet tea  [facility] physical activity: [facility] and walking 2 days a week for an hour.  Estimated energy needs: 1200  calories 135 g carbohydrates 90 g protein 33 g fat  Progress Towards Goal(s):  In progress.   Nutritional Diagnosis:  NI-1.5 Excessive energy intake As related to high calorie diet.  As evidenced by BMI > 30 and elevated lipid profile and prediabetes.    Intervention:  Nutrition Nutrition and Diabetes education provided on My Plate, CHO counting, meal planning, portion sizes, timing of meals, avoiding snacks between meals. Low Fat high fiber low salt diet. Importance of taking medications as prescribed, benefits of exercising >30 minutes per day and prevention of Dm and CVD. Healthy weight loss tips, avoiding snacks.   Goals 1. Follow My Plate 2. Measure foods out 3. Eat 2-3 carbs per meals 4. Increase low carb vegetables. 5. Drink only water and cut out soda and tea 6. Keep exercise aim for 3-4 times per week. Avoid snacks between meals  Don't eat  past 7 pm Lose 1 lb per week. Talk to Dr. Wolfgang Phoenix about getting an A1C test.   Teaching Method Utilized:  Visual Auditory Hands on  Handouts given during visit include:  The Plate Method  Meal Plan Card   Barriers to learning/adherence to lifestyle change: none  Demonstrated degree of understanding via:  Teach Back   Monitoring/Evaluation:  Dietary intake, exercise, meal planning, and body weight in 1 month(s). She would benefit from getting an A1C level to better evaluate blood sugar control.

## 2017-01-10 ENCOUNTER — Telehealth: Payer: Self-pay | Admitting: Family Medicine

## 2017-01-10 DIAGNOSIS — R7309 Other abnormal glucose: Secondary | ICD-10-CM

## 2017-01-10 DIAGNOSIS — E781 Pure hyperglyceridemia: Secondary | ICD-10-CM

## 2017-01-10 DIAGNOSIS — E784 Other hyperlipidemia: Secondary | ICD-10-CM | POA: Diagnosis not present

## 2017-01-10 NOTE — Telephone Encounter (Signed)
Left message return call 01/10/17

## 2017-01-10 NOTE — Telephone Encounter (Signed)
Please add A1c. In addition to this make sure that the patient when she goes to get blood drawn alerts the lab that she is getting 2 separate tests lipid and A1c

## 2017-01-10 NOTE — Telephone Encounter (Signed)
Pt is needing an A1C added to her lab work per the dietician. Pt is wanting to get this done in the morning.

## 2017-01-11 ENCOUNTER — Encounter: Payer: Self-pay | Admitting: Family Medicine

## 2017-01-11 DIAGNOSIS — E781 Pure hyperglyceridemia: Secondary | ICD-10-CM | POA: Diagnosis not present

## 2017-01-11 DIAGNOSIS — R7309 Other abnormal glucose: Secondary | ICD-10-CM | POA: Diagnosis not present

## 2017-01-11 LAB — LIPID PANEL
CHOLESTEROL TOTAL: 215 mg/dL — AB (ref 100–199)
Chol/HDL Ratio: 4.8 ratio units — ABNORMAL HIGH (ref 0.0–4.4)
HDL: 45 mg/dL (ref >39)
LDL Calculated: 126 mg/dL — ABNORMAL HIGH (ref 0–99)
Triglycerides: 219 mg/dL — ABNORMAL HIGH (ref 0–149)
VLDL Cholesterol Cal: 44 mg/dL — ABNORMAL HIGH (ref 5–40)

## 2017-01-12 ENCOUNTER — Telehealth: Payer: Self-pay | Admitting: Family Medicine

## 2017-01-12 ENCOUNTER — Encounter: Payer: Self-pay | Admitting: Family Medicine

## 2017-01-12 LAB — HEMOGLOBIN A1C
Est. average glucose Bld gHb Est-mCnc: 111 mg/dL
HEMOGLOBIN A1C: 5.5 % (ref 4.8–5.6)

## 2017-01-12 NOTE — Telephone Encounter (Signed)
It is fine to go ahead with 60 mg Cymbalta, may have 6 month supply-either 30 day with refills or patient or his 90 day with one refill

## 2017-01-12 NOTE — Telephone Encounter (Signed)
Left message return call 01/12/17

## 2017-01-12 NOTE — Telephone Encounter (Signed)
Patient said Dr. Nicki Reaper told her when she needed a refill this time that he was going to up the dosage of her Cymbalta from 30mg  to 60mg . Would like that called in to the [facility] in [place].  Patient will be out of the house for a little while this morning but if you need to call it is ok to leave a detailed message on her answering machine.  [phone]  thx

## 2017-01-13 ENCOUNTER — Encounter: Payer: PPO | Attending: Family Medicine | Admitting: Nutrition

## 2017-01-13 VITALS — Wt 173.0 lb

## 2017-01-13 DIAGNOSIS — E782 Mixed hyperlipidemia: Secondary | ICD-10-CM

## 2017-01-13 DIAGNOSIS — E669 Obesity, unspecified: Secondary | ICD-10-CM

## 2017-01-13 NOTE — Patient Instructions (Addendum)
Goals 1. Stop eating fast foods 2. Cut down on red meat. --no more than 1 per week 3. Increase fresh fruits and vegetables 4. [place] and focus on eating a more plant based diet. 5. Exercise 60  Minutes 4 times per week 6. Lose 1 lb per week Follow High Fiber Diet and Low Sodium Diet

## 2017-01-13 NOTE — Progress Notes (Signed)
Medical Nutrition Therapy:  Appt start time: 0800 end time:  0930.  Assessment:  Primary concerns today: Obesity, Hyperlipidemia and Impaired fasting blood sugars.A1C 5.5%. Strong family history of heart disease. Had been sick this last month with a virus and now getting back to exercising and eating better. Lost 4 lbs. She notes she has lost about 10 lbs but regained some when she was sick and eating comfort foods. Changes made:  Cut down on portions. Not eating after 7 pm. Cutting out soft drinks and tea.  She admits to still eating a lot of fast foods.   Lipid profile shows elevated numbers from last year. However, she has only implemented dietary and lifestyle changes in the last 2 months. She is feeling a lot better now and ready to get back to exercising and eating better diet. Interested in eating more plant based diet/high fiber diet. Suggested following [place]  Or something similar.   Lipid Panel     Component Value Date/Time   CHOL 215 (H) 01/10/2017 0817   TRIG 219 (H) 01/10/2017 0817   HDL 45 01/10/2017 0817   CHOLHDL 4.8 (H) 01/10/2017 0817   CHOLHDL 4.5 08/20/2013 0741   VLDL 59 (H) 08/20/2013 0741   LDLCALC 126 (H) 01/10/2017 0817   BMET    Component Value Date/Time   NA 147 (H) 10/01/2016 0823   K 4.2 10/01/2016 0823   CL 104 10/01/2016 0823   CO2 23 10/01/2016 0823   GLUCOSE 103 (H) 10/01/2016 0823   GLUCOSE 125 (H) 06/01/2016 1430   BUN 12 10/01/2016 0823   CREATININE 0.77 10/01/2016 0823   CREATININE 0.79 08/20/2013 0741   CALCIUM 9.4 10/01/2016 0823   GFRNONAA 82 10/01/2016 0823   GFRAA 95 10/01/2016 0823   Preferred Learning Style:   No preference indicated   Learning Readiness:   Ready  Change in progress   MEDICATIONS:    DIETARY INTAKE:  24-hr recall:  B ( AM): 1 egg, 1 slice toast and fruit  Snk ( AM):   L ( PM):  Chicken pot pie, water Snk ( PM):  D ( PM): Spaghetti, and ice cream sandwich and water Snk ( PM): Beverages:  water,  Usual physical activity: [facility] and walking 2 days a week for an hour.  Estimated energy needs: 1200  calories 135 g carbohydrates 90 g protein 33 g fat  Progress Towards Goal(s):  In progress.   Nutritional Diagnosis:  NI-1.5 Excessive energy intake As related to high calorie diet.  As evidenced by BMI > 30 and elevated lipid profile and prediabetes.    Intervention:  Nutrition Nutrition and Diabetes education provided on My Plate, CHO counting, meal planning, portion sizes, timing of meals, avoiding snacks between meals. Low Fat high fiber low salt diet. Importance of taking medications as prescribed, benefits of exercising >30 minutes per day and prevention of Dm and CVD. Healthy weight loss tips, avoiding snacks. Plant based diet.  Goals 1. Stop eating fast foods 2. Cut down on red meat. --no more than 1 per week 3. Increase fresh fruits and vegetables 4. [place] and focus on eating a more plant based diet. 5. Exercise 60  Minutes 4 times per week 6. Lose 1 lb per week Follow High Fiber Diet and Low Sodium Diet  Teaching Method Utilized:  Visual Auditory Hands on  Handouts given during visit include:  The Plate Method  Meal Plan Card   Barriers to learning/adherence to  lifestyle change: none  Demonstrated degree of understanding via:  Teach Back   Monitoring/Evaluation:  Dietary intake, exercise, meal planning, and body weight in 1 month(s).

## 2017-01-17 MED ORDER — DULOXETINE HCL 60 MG PO CPEP: 60.0000 mg | ORAL_CAPSULE | Freq: Every day | ORAL | 5 refills | Status: DC

## 2017-01-17 NOTE — Telephone Encounter (Signed)
Prescription sent electronically to pharmacy. Patient notified. 

## 2017-01-17 NOTE — Telephone Encounter (Signed)
Patient notified

## 2017-01-17 NOTE — Addendum Note (Signed)
Addended by: Dairl Ponder on: 01/17/2017 09:08 AM   Modules accepted: Orders

## 2017-02-10 ENCOUNTER — Encounter: Payer: Self-pay | Attending: Family Medicine | Admitting: Nutrition

## 2017-02-10 VITALS — Wt 174.0 lb

## 2017-02-10 DIAGNOSIS — R7301 Impaired fasting glucose: Secondary | ICD-10-CM

## 2017-02-10 DIAGNOSIS — E669 Obesity, unspecified: Secondary | ICD-10-CM

## 2017-02-10 DIAGNOSIS — E785 Hyperlipidemia, unspecified: Secondary | ICD-10-CM

## 2017-02-10 NOTE — Patient Instructions (Addendum)
Keep up the good work Increase high fiber vegetables, grains, and whole wheat products Gradually increase walking as tolerated for > 150 minutes per week. Reduced Lipid profile by 10% within 6-12 months. Lose 10% of weight over 12 months Re try omega fish oils Increase omega 3 foods of salmon, tuna.

## 2017-02-10 NOTE — Progress Notes (Signed)
  Medical Nutrition Therapy:  Appt start time: 0800 end time:  0930.  Assessment:  Primary concerns today: Obesity, Hyperlipidemia and Impaired fasting blood sugars.A1C 5.5%  Cut out snacks, no sodas and less milk intake. Drinking a lot more water, sf tea and coffee. Had her depression meds increased due to increased feelings of depression from Dr. Tim Lair office. Lost 3 lbs since last visit. Tried using exercise bike and her knees became swollen and had back issues. So back to walking 45 minutes twice a week.   Eating more fiber of bran, oatmeal and dark leafy greens. Feels much better over all. Making slow and stead progress. Encouraged her to reconsider trying Omega 3 Fish Oils for cardiac health.  Lipid Panel     Component Value Date/Time   CHOL 215 (H) 01/10/2017 0817   TRIG 219 (H) 01/10/2017 0817   HDL 45 01/10/2017 0817   CHOLHDL 4.8 (H) 01/10/2017 0817   CHOLHDL 4.5 08/20/2013 0741   VLDL 59 (H) 08/20/2013 0741   LDLCALC 126 (H) 01/10/2017 0817   BMET    Component Value Date/Time   NA 147 (H) 10/01/2016 0823   K 4.2 10/01/2016 0823   CL 104 10/01/2016 0823   CO2 23 10/01/2016 0823   GLUCOSE 103 (H) 10/01/2016 0823   GLUCOSE 125 (H) 06/01/2016 1430   BUN 12 10/01/2016 0823   CREATININE 0.77 10/01/2016 0823   CREATININE 0.79 08/20/2013 0741   CALCIUM 9.4 10/01/2016 0823   GFRNONAA 82 10/01/2016 0823   GFRAA 95 10/01/2016 0823   Preferred Learning Style:   No preference indicated   Learning Readiness:   Ready  Change in progress   MEDICATIONS:    DIETARY INTAKE:  24-hr recall:  B ( AM): Pineapple, cottage cheese and coffee Snk ( AM):   L ( PM):  1 cup green beans, grilled pork chop, 1/2 roll, 2 italian cookies. Water Snk ( PM):  D ( PM): fish, bun and slaw and unsweet tea Snk ( PM): Beverages: water,  Usual physical activity: [facility] and walking 2 days a week for an hour.  Estimated energy needs: 1200  calories 135 g carbohydrates 90 g  protein 33 g fat  Progress Towards Goal(s):  In progress.   Nutritional Diagnosis:  NI-1.5 Excessive energy intake As related to high calorie diet.  As evidenced by BMI > 30 and elevated lipid profile and prediabetes.    Intervention:  Nutrition Nutrition and Diabetes education provided on My Plate, CHO counting, meal planning, portion sizes, timing of meals, avoiding snacks between meals. Low Fat high fiber low salt diet. Importance of taking medications as prescribed, benefits of exercising >30 minutes per day and prevention of Dm and CVD. Healthy weight loss tips, avoiding snacks. Plant based diet.  Goals   Keep up the good work Increase high fiber vegetables, grains, and whole wheat products Gradually increase walking as tolerated for > 150 minutes per week. Reduced Lipid profile by 10% within 6-12 months. Lose 10% of weight over 12 months Re try omega fish oils Increase omega 3 foods of salmon, tuna.    Teaching Method Utilized:  Visual Auditory Hands on  Handouts given during visit include:  The Plate Method  Meal Plan Card   Barriers to learning/adherence to lifestyle change: none  Demonstrated degree of understanding via:  Teach Back   Monitoring/Evaluation:  Dietary intake, exercise, meal planning, and body weight in 3 month(s).

## 2017-02-16 ENCOUNTER — Telehealth: Payer: Self-pay | Admitting: Family Medicine

## 2017-02-16 NOTE — Telephone Encounter (Signed)
Blood pressure reading for today  Laying down:117/64 Sitting:104/64 Standing:110/74

## 2017-02-16 NOTE — Telephone Encounter (Signed)
Patient has appt tomorrow with Hoyle Sauer

## 2017-02-16 NOTE — Telephone Encounter (Signed)
Keep appointment with Emily [name]

## 2017-02-17 ENCOUNTER — Encounter: Payer: Self-pay | Admitting: Nurse Practitioner

## 2017-02-17 ENCOUNTER — Ambulatory Visit (INDEPENDENT_AMBULATORY_CARE_PROVIDER_SITE_OTHER): Payer: PPO | Admitting: Nurse Practitioner

## 2017-02-17 VITALS — BP 120/72 | Temp 98.5°F | Ht 62.0 in | Wt 172.1 lb

## 2017-02-17 DIAGNOSIS — J300 Vasomotor rhinitis: Secondary | ICD-10-CM | POA: Diagnosis not present

## 2017-02-17 DIAGNOSIS — R42 Dizziness and giddiness: Secondary | ICD-10-CM | POA: Diagnosis not present

## 2017-02-17 LAB — POCT HEMOGLOBIN: HEMOGLOBIN: 14.7 g/dL (ref 12.2–16.2)

## 2017-02-17 LAB — POCT GLUCOSE (DEVICE FOR HOME USE): POC GLUCOSE: 109 mg/dL — AB (ref 70–99)

## 2017-02-17 NOTE — Patient Instructions (Addendum)
Flonase, Nasacort and Rhinocort claritin or allegra Meclizine 25 mg every 6 hours as needed

## 2017-02-18 ENCOUNTER — Encounter: Payer: Self-pay | Admitting: Nurse Practitioner

## 2017-02-18 NOTE — Progress Notes (Signed)
Subjective:  Presents for complaints of vertigo that began about 3 weeks ago. Has gotten progressively worse over time. Only occurs with position changes. For example worse when rolling over in the bed or quick movements of the head such as sudden position change. Also occurs if she looks up and looks down quickly moving her head. Episodes are very brief. No syncope. No numbness or weakness of the face arms or leg. No headache. No visual changes. No difficulty speaking or swallowing. No chest pain shortness of breath or palpitations. Has had 2 previous episodes with vertigo in the past which resolved. No fever sore throat cough or ear pain. No nausea or vomiting. Does not affect her ability to perform daily activities or driving.  Objective:   BP 120/72   Temp 98.5 F (36.9 C) (Oral)   Ht 5\' 2"  (1.575 m)   Wt 172 lb 2 oz (78.1 kg)   BMI 31.48 kg/m  NAD. Alert, oriented. Cheerful affect. TMs retracted bilateral, no erythema. Pharynx nonerythematous with cloudy PND noted. Neck supple with mild soft anterior adenopathy. Lungs clear. Heart regular rate rhythm. No murmur or gallop noted. Pupils equal and reactive to light. EOMs intact without nystagmus. Point-to-point localization normal limit. Hand strength 5+ bilateral. Reflexes normal limit. Romberg negative. Patient sent orthostatic blood pressures done at home through my chart which are stable. Results for orders placed or performed in visit on 02/17/17  POCT Glucose (Device for Home Use)  Result Value Ref Range   Glucose Fasting, POC  70 - 99 mg/dL   POC Glucose 109 (A) 70 - 99 mg/dl  POCT hemoglobin  Result Value Ref Range   Hemoglobin 14.7 12.2 - 16.2 g/dL     Assessment:  Vertigo - Plan: POCT Glucose (Device for Home Use), POCT hemoglobin  Acute vasomotor rhinitis - Plan: POCT Glucose (Device for Home Use), POCT hemoglobin    Plan:    Daily steroid nasal spray and antihistamine. Also OTC meclizine as directed if needed. Episodes are  so brief patient states that she does not need any prescription medications at this point. Warning signs reviewed. Call back in 7-10 days if no improvement, sooner if worse.

## 2017-03-19 ENCOUNTER — Other Ambulatory Visit: Payer: Self-pay | Admitting: Family Medicine

## 2017-03-23 DIAGNOSIS — M542 Cervicalgia: Secondary | ICD-10-CM | POA: Diagnosis not present

## 2017-03-23 DIAGNOSIS — M25512 Pain in left shoulder: Secondary | ICD-10-CM | POA: Diagnosis not present

## 2017-03-25 DIAGNOSIS — M5416 Radiculopathy, lumbar region: Secondary | ICD-10-CM | POA: Diagnosis not present

## 2017-03-28 ENCOUNTER — Encounter: Payer: Self-pay | Admitting: Family Medicine

## 2017-03-28 ENCOUNTER — Ambulatory Visit (INDEPENDENT_AMBULATORY_CARE_PROVIDER_SITE_OTHER): Payer: PPO | Admitting: Family Medicine

## 2017-03-28 VITALS — BP 122/80 | Ht 62.0 in | Wt 174.5 lb

## 2017-03-28 DIAGNOSIS — E781 Pure hyperglyceridemia: Secondary | ICD-10-CM | POA: Diagnosis not present

## 2017-03-28 DIAGNOSIS — Z79899 Other long term (current) drug therapy: Secondary | ICD-10-CM | POA: Diagnosis not present

## 2017-03-28 DIAGNOSIS — I10 Essential (primary) hypertension: Secondary | ICD-10-CM | POA: Diagnosis not present

## 2017-03-28 DIAGNOSIS — Z113 Encounter for screening for infections with a predominantly sexual mode of transmission: Secondary | ICD-10-CM | POA: Diagnosis not present

## 2017-03-28 DIAGNOSIS — F324 Major depressive disorder, single episode, in partial remission: Secondary | ICD-10-CM | POA: Diagnosis not present

## 2017-03-28 DIAGNOSIS — E669 Obesity, unspecified: Secondary | ICD-10-CM | POA: Diagnosis not present

## 2017-03-28 DIAGNOSIS — K219 Gastro-esophageal reflux disease without esophagitis: Secondary | ICD-10-CM | POA: Insufficient documentation

## 2017-03-28 MED ORDER — GEMFIBROZIL 600 MG PO TABS: 600.0000 mg | ORAL_TABLET | Freq: Two times a day (BID) | ORAL | 5 refills | Status: DC

## 2017-03-28 MED ORDER — PANTOPRAZOLE SODIUM 40 MG PO TBEC: 40.0000 mg | DELAYED_RELEASE_TABLET | Freq: Every day | ORAL | 8 refills | Status: DC

## 2017-03-28 MED ORDER — DULOXETINE HCL 60 MG PO CPEP: 60.0000 mg | ORAL_CAPSULE | Freq: Every day | ORAL | 5 refills | Status: DC

## 2017-03-28 NOTE — Progress Notes (Signed)
   Subjective:    Patient ID: Emily [name], female    DOB: 10-06-1951, 66 y.o.   MRN: [id number]  Hypertension  This is a chronic problem. The current episode started more than 1 year ago. The problem has been gradually improving since onset. Pertinent negatives include no chest pain, headaches or shortness of breath. There are no associated agents to hypertension. There are no known risk factors for coronary artery disease. Treatments tried: verapamil. The current treatment provides moderate improvement. There are no compliance problems.    Patient is also following up on her cymbalta increase.  Her moods are overall doing well she is under a lot of stress her daughter abuses drugs no longer lives in the home she has a grandson that she is taking care for years who is become very painful and disrespectful  Patient does a good job taking a reflux medicine states under good control denies any problems  Having some orthopedic problems will be seen specialists for follow-up  Patient had her brother recently diagnosed with advanced heart disease and heart attack she feels she does not have any chest tightness pressure pain or shortness of breath but has some risk factors for heart disease  The patient does try to watch her diet she is trying to lose weight she has lost few pounds she is also try to monitor starches as well. Recent A1c within the past few months was normal.  Review of Systems  Constitutional: Negative for activity change, fatigue and fever.  Respiratory: Negative for cough and shortness of breath.   Cardiovascular: Negative for chest pain and leg swelling.  Neurological: Negative for headaches.       Objective:   Physical Exam  Constitutional: She appears well-nourished. No distress.  Cardiovascular: Normal rate, regular rhythm and normal heart sounds.   No murmur heard. Pulmonary/Chest: Effort normal and breath sounds normal. No respiratory distress.  Musculoskeletal:  She exhibits no edema.  Lymphadenopathy:    She has no cervical adenopathy.  Neurological: She is alert. She exhibits normal muscle tone.  Psychiatric: Her behavior is normal.  Vitals reviewed.         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 Blood pressure under good control  Reflux under good control with medication continue current medication.  Hyperlipidemia check lipid profile within the next couple months if not within his own necessary consider switching to a statin or other medicine watch diet closely  Mild obesity and encouragement for the patient to lose weight she has been mildly successful so far.  Mental health she is doing well with the Cymbalta 60 mg continue this.  Follow-up 6 months

## 2017-03-29 ENCOUNTER — Ambulatory Visit: Payer: PPO | Admitting: Orthopedic Surgery

## 2017-03-31 DIAGNOSIS — M25512 Pain in left shoulder: Secondary | ICD-10-CM | POA: Diagnosis not present

## 2017-04-04 DIAGNOSIS — M25512 Pain in left shoulder: Secondary | ICD-10-CM | POA: Diagnosis not present

## 2017-04-07 DIAGNOSIS — S46012A Strain of muscle(s) and tendon(s) of the rotator cuff of left shoulder, initial encounter: Secondary | ICD-10-CM | POA: Diagnosis not present

## 2017-04-07 DIAGNOSIS — M66812 Spontaneous rupture of other tendons, left shoulder: Secondary | ICD-10-CM | POA: Diagnosis not present

## 2017-04-07 DIAGNOSIS — M24112 Other articular cartilage disorders, left shoulder: Secondary | ICD-10-CM | POA: Diagnosis not present

## 2017-04-07 DIAGNOSIS — M7542 Impingement syndrome of left shoulder: Secondary | ICD-10-CM | POA: Diagnosis not present

## 2017-04-07 DIAGNOSIS — G8918 Other acute postprocedural pain: Secondary | ICD-10-CM | POA: Diagnosis not present

## 2017-04-07 DIAGNOSIS — M75122 Complete rotator cuff tear or rupture of left shoulder, not specified as traumatic: Secondary | ICD-10-CM | POA: Diagnosis not present

## 2017-04-07 DIAGNOSIS — M19012 Primary osteoarthritis, left shoulder: Secondary | ICD-10-CM | POA: Diagnosis not present

## 2017-04-20 DIAGNOSIS — M19012 Primary osteoarthritis, left shoulder: Secondary | ICD-10-CM | POA: Diagnosis not present

## 2017-04-20 DIAGNOSIS — M7542 Impingement syndrome of left shoulder: Secondary | ICD-10-CM | POA: Diagnosis not present

## 2017-05-16 ENCOUNTER — Encounter: Payer: PPO | Attending: Family Medicine | Admitting: Nutrition

## 2017-05-16 VITALS — Wt 166.6 lb

## 2017-05-16 DIAGNOSIS — E669 Obesity, unspecified: Secondary | ICD-10-CM

## 2017-05-16 DIAGNOSIS — E7800 Pure hypercholesterolemia, unspecified: Secondary | ICD-10-CM

## 2017-05-16 NOTE — Progress Notes (Signed)
Medical Nutrition Therapy:  Appt start time: 1030end time:  1100 Assessment:  Primary concerns today: Obesity, Hyperlipidemia and Impaired fasting blood sugars.A1C 5.5%  Cut out snacks, no sodas and less milk intake. Drinking a lot more water. Has lost 8 lbs since last visit 3 months ago.Marland Kitchen Has cut out processed foods, fast foods and snacks. Exercise: had been exercising before shoulder surgery 6 weeks ago. Will start therapy on her shoulder next week and exercise again when she can.  Taking Lopid for her cholesterol.  Feels less tired, has less indigestion. Feels better, more energy and less bloated. Working on following the [employer] and more plant based diet. Limiting red meat.    Will get new lab work done at the end of the month. Desires to get her weight down to 150 lbs over the next year.   ,Lipid Panel     Component Value Date/Time   CHOL 215 (H) 01/10/2017 0817   TRIG 219 (H) 01/10/2017 0817   HDL 45 01/10/2017 0817   CHOLHDL 4.8 (H) 01/10/2017 0817   CHOLHDL 4.5 08/20/2013 0741   VLDL 59 (H) 08/20/2013 0741   LDLCALC 126 (H) 01/10/2017 0817   BMET    Component Value Date/Time   NA 147 (H) 10/01/2016 0823   K 4.2 10/01/2016 0823   CL 104 10/01/2016 0823   CO2 23 10/01/2016 0823   GLUCOSE 103 (H) 10/01/2016 0823   GLUCOSE 125 (H) 06/01/2016 1430   BUN 12 10/01/2016 0823   CREATININE 0.77 10/01/2016 0823   CREATININE 0.79 08/20/2013 0741   CALCIUM 9.4 10/01/2016 0823   GFRNONAA 82 10/01/2016 0823   GFRAA 95 10/01/2016 0823   Preferred Learning Style:   No preference indicated   Learning Readiness:   Ready  Change in progress   MEDICATIONS:    DIETARY INTAKE:  24-hr recall:  B ( AM): egg, onions, ham, 4 oz juice and coffee and toast Snk ( AM):  Blueberries  6  L ( PM):  Mexican dip with ground beef, onions, peppers, 4 crackers, unsweet tea,  Snk ( PM):  D ( PM): Lasagna 1/2 slice toast and orange and unsweet tea. Snk ( PM): Beverages:  water,  Usual physical activity: [facility] and walking 2 days a week for an hour.  Estimated energy needs: 1200  calories 135 g carbohydrates 90 g protein 33 g fat  Progress Towards Goal(s):  In progress.   Nutritional Diagnosis:  NI-1.5 Excessive energy intake As related to high calorie diet.  As evidenced by BMI > 30 and elevated lipid profile and prediabetes.    Intervention:  Nutrition Nutrition and Diabetes education provided on My Plate, CHO counting, meal planning, portion sizes, timing of meals, avoiding snacks between meals. Low Fat high fiber low salt diet. Importance of taking medications as prescribed, benefits of exercising >30 minutes per day and prevention of Dm and CVD. Healthy weight loss tips, avoiding snacks. Plant based diet.  Goals   Keep up the good work Increase high fiber vegetables, grains, and whole wheat products Gradually increase walking as tolerated for > 150 minutes per week. Reduced Lipid profile by 10% within 6-12 months. Lose 10% of weight over 12 months Re try omega fish oils Increase omega 3 foods of salmon, tuna.    Teaching Method Utilized:  Visual Auditory Hands on  Handouts given during visit include:  The Plate Method  Meal Plan Card   Barriers to learning/adherence to lifestyle change: none  Demonstrated degree of  understanding via:  Teach Back   Monitoring/Evaluation:  Dietary intake, exercise, meal planning, and body weight in 6 month(s).

## 2017-05-16 NOTE — Patient Instructions (Addendum)
Goals 1. Keep up the great job!! 2. Keep eating heart healthy foods. 3. Increase exercise as able with Physical Therapy. 4. Lose 2-3 lb per month  Get labs done for MD visit at the end of the month Get LDL below 100 mg/dl.

## 2017-05-18 DIAGNOSIS — S46012D Strain of muscle(s) and tendon(s) of the rotator cuff of left shoulder, subsequent encounter: Secondary | ICD-10-CM | POA: Diagnosis not present

## 2017-05-31 ENCOUNTER — Ambulatory Visit (HOSPITAL_COMMUNITY): Payer: PPO | Attending: Orthopedic Surgery

## 2017-05-31 ENCOUNTER — Encounter (HOSPITAL_COMMUNITY): Payer: Self-pay

## 2017-05-31 DIAGNOSIS — M25512 Pain in left shoulder: Secondary | ICD-10-CM | POA: Diagnosis not present

## 2017-05-31 DIAGNOSIS — R29898 Other symptoms and signs involving the musculoskeletal system: Secondary | ICD-10-CM

## 2017-05-31 DIAGNOSIS — M25612 Stiffness of left shoulder, not elsewhere classified: Secondary | ICD-10-CM | POA: Insufficient documentation

## 2017-05-31 NOTE — Patient Instructions (Signed)
Doorway Stretch  Place each hand opposite each other on the doorway. (You can change where you feel the stretch by moving arms higher or lower.) Step through with one foot and bend front knee until a stretch is felt and hold. Step through with the opposite foot on the next rep. Hold for _____ seconds. Repeat ____times.     Scapular Retraction (Standing)   With arms at sides, pinch shoulder blades together. Repeat ____ times per set. Do ____ sets per session. Do ____ sessions per day.  http://orth.exer.us/944      Posterior Capsule Stretch   Stand or sit, one arm across body so hand rests over opposite shoulder. Gently push on crossed elbow with other hand until stretch is felt in shoulder of crossed arm. Hold ___ seconds.  Repeat ___ times per session. Do ___ sessions per day.   Wall Flexion  Using a towel, slide your arm up the wall until a stretch is felt in your shoulder .         Shoulder Abduction Stretch  Stand side ways by a wall with affected up on wall. Gently lean toward wall to feel stretch.

## 2017-05-31 NOTE — Therapy (Signed)
[place], [place], [address] Phone: [phone]   Fax:  [phone]  Occupational Therapy Evaluation  Patient Details  Name: Emily [name] MRN: [id number] Date of Birth: 1950/12/27 Referring Provider: Nicholes Stairs, MD  Encounter Date: 05/31/2017      OT End of Session - 05/31/17 0901    Visit Number 1   Number of Visits 12   Date for OT Re-Evaluation 07/11/17  Mini reassess: 06/28/17   Authorization Type [employer] - copay $15   Authorization Time Period before 10th visit   Authorization - Visit Number 1   Authorization - Number of Visits 10   OT Start Time [phone]   OT Stop Time 0900   OT Time Calculation (min) 39 min   Equipment Utilized During Treatment N/A   Activity Tolerance Patient tolerated treatment well   Behavior During Therapy [facility] for tasks assessed/performed      Past Medical History:  Diagnosis Date  . Acid reflux   . Anxiety   . CHF (congestive heart failure) (HCC)    90's swelling body-no breathing problems at that time-no cardiac -in dr s Wolfgang [place] office [place] give lasix and potassium  . Complication of anesthesia    History of being told she was allergic to something in 1980 no problems since  . Depression   . Fibromyalgia   . History of blood transfusion reaction 1978  . History of hiatal hernia   . HNP (herniated nucleus pulposus)   . Hypertension    Does not see a cardiologist no stress or echo  . Joint pain   . Nerve pain    right leg   . Ruptured lumbar disc   . Sleep apnea    Uses CPAP every night setting at 7 mm/Hg    Past Surgical History:  Procedure Laterality Date  . BACK SURGERY     x2  . BLADDER REPAIR     tear during during birth of child  . CARPAL TUNNEL RELEASE Bilateral   . CESAREAN SECTION    . CHOLECYSTECTOMY     gallbladder disease; stayed sick  . COLONOSCOPY N/A 04/28/2016   Procedure: COLONOSCOPY;  Surgeon: [name], MD;   Location: [facility];  Service: Endoscopy;  Laterality: N/A;  . ESOPHAGOGASTRODUODENOSCOPY N/A 04/28/2016   Procedure: ESOPHAGOGASTRODUODENOSCOPY (EGD);  Surgeon: [name], MD;  Location: [facility];  Service: Endoscopy;  Laterality: N/A;  12:00  . GALLBLADDER SURGERY    . knee right (torn Cart)    . REPLACEMENT TOTAL KNEE     left  . ROTATOR CUFF REPAIR     right  . SPINAL FUSION    . TUBAL LIGATION    . vein right leg removed    . wrist ganglion cyst left      There were no vitals filed for this visit.      Subjective Assessment - 05/31/17 0911    Subjective  S: It is just a sore feeling and it hurts more when I use it too much.   Pertinent History Pt is a 66 year old female who had left shoulder arthoscopic surgery on 04/07/17.  Dr. Stann Mainland has referred pt to occupational therapy for evaluation and treatment following a standard shoulder arthroscopy protocol.    Patient Stated Goals Pt states that she wants to get better ROM in her left arm and to be able to use her left arm as well as her right.  Currently in Pain? Yes   Pain Score 2    Pain Location Shoulder   Pain Orientation Left   Pain Descriptors / Indicators Aching;Sore;Constant   Pain Type Acute pain   Pain Radiating Towards N/A   Pain Onset More than a month ago   Pain Frequency Constant   Aggravating Factors  too much use   Pain Relieving Factors ice   Effect of Pain on Daily Activities minimal effect   Multiple Pain Sites No           [facility] OT Assessment - 05/31/17 0829      Assessment   Diagnosis left shoulder arthroscopy   Referring Provider Nicholes Stairs, MD   Onset Date 04/07/17   Prior Therapy none     Precautions   Precautions Shoulder   Type of Shoulder Precautions Week 6-12 (05/19/17-06/30/17): A/ROM, scapular strengthening, when ROM is full begin light strengthening, theraband exercises in pain free ROM; Week 12+ (06/30/17): full strengthening, work on shoulder stability and  endurance, avoid heavy strengthening with arm abducted, avoid empty can motion, always completed activities with thumb up     Restrictions   Weight Bearing Restrictions No     Balance Screen   Has the patient fallen in the past 6 months No     Home  Environment   Family/patient expects to be discharged to: Private residence   Lives With Spouse;Other (Comment)  two grandsons     Prior Function   Level of Independence Independent   Vocation Retired   Leisure walking, visiting museums, riding in car     ADL   ADL comments Pt reports having trouble with upper and lower body dressing, washing hair, sewing at a sewing machine, holding books, and anything that involves reaching overhead.      Mobility   Mobility Status Independent     Written Expression   Dominant Hand Right     Vision - History   Baseline Vision Wears glasses all the time     Cognition   Overall Cognitive Status Within Functional Limits for tasks assessed     ROM / Strength   AROM / PROM / Strength AROM;PROM;Strength     Palpation   Palpation comment Pt presentes with a mod amount of fascial restrictions in her left shoulder, trapezious and scapularis regions.     AROM   Overall AROM  Deficits   Overall AROM Comments assessed seated. IR/er adducted.   AROM Assessment Site Shoulder   Right/Left Shoulder Left   Left Shoulder Flexion 125 Degrees   Left Shoulder ABduction 73 Degrees   Left Shoulder Internal Rotation 90 Degrees   Left Shoulder External Rotation 45 Degrees     PROM   Overall PROM  Deficits   Overall PROM Comments assessed supine. IR/er adducted.   PROM Assessment Site Shoulder   Right/Left Shoulder Left   Left Shoulder Flexion 147 Degrees   Left Shoulder ABduction 157 Degrees   Left Shoulder Internal Rotation 90 Degrees   Left Shoulder External Rotation 68 Degrees     Strength   Overall Strength Deficits   Overall Strength Comments assessed seated. IR/er adducted   Strength  Assessment Site Shoulder   Right/Left Shoulder Left   Left Shoulder Flexion 3-/5   Left Shoulder ABduction 3-/5   Left Shoulder Internal Rotation 3+/5   Left Shoulder External Rotation 3-/5               OT Education - 05/31/17 1006  Education provided Yes   Education Details Provided pt with HEP of shoulder stretches, discussed need for therapy with pt   Person(s) Educated Patient   Methods Explanation;Demonstration;Verbal cues;Handout   Comprehension Returned demonstration;Verbalized understanding          OT Short Term Goals - 05/31/17 1013      OT SHORT TERM GOAL #1   Title Pt will understand and be independent with the HEP provided to facilitate her progress with shoulder ROM and strength.   Time 3   Period Weeks   Status New     OT SHORT TERM GOAL #2   Title Pt will have strength of 4-/5 to assist in her left arm for ADL tasks such as holding books to read and pots during cooking.   Time 3   Period Weeks   Status New     OT SHORT TERM GOAL #3   Title Patients P/ROM of left arm will be within normal limits to increase her ability to complete overhead reaching activities.   Time 3   Period Weeks   Status New     OT SHORT TERM GOAL #4   Title Pt will have decreased fascial restrictions to min in her LUE to relieve tightness and tenderness to facilitate better ROM.   Time 3   Period Weeks   Status New           OT Long Term Goals - 05/31/17 1016      OT LONG TERM GOAL #1   Title Pt will return to prior level of independence with all daily and leisure activities using her left arm.   Time 6   Period Weeks   Status New     OT LONG TERM GOAL #2   Title Pt will have decreased fascial restrictions to zero in her LUE to facilitate better ROM. to assist in overhead reaching activities.    Time 6   Period Weeks   Status New     OT LONG TERM GOAL #3   Title Pt will have strength of 4+/5 to assist her left arm for ADL tasks such as cooking and  sewing.   Time 6   Period Weeks   Status New     OT LONG TERM GOAL #4   Title Patients A/ROM of left arm will be within normal limits to increase her ability to complete all overhead reaching activities   Time 6   Period Weeks   Status New               Plan - 05/31/17 1007    Clinical Impression Statement A: Pt is a 66 year old female S/P left shoulder arthroscopic repair surgery causing increased fascial restrictions and decreased strength and ROM resulting in difficulty completing ADL tasks with LUE.    Occupational Profile and client history currently impacting functional performance Motivated to return to prior level of function, worked in healthcare and understands importance of therapy, had similar therapy done to right shoulder in the past   Occupational performance deficits (Please refer to evaluation for details): ADL's;IADL's;Rest and Sleep;Leisure   Rehab Potential Good   Current Impairments/barriers affecting progress: N/A   OT Frequency 2x / week   OT Duration 6 weeks   OT Treatment/Interventions Self-care/ADL training;Therapeutic exercise;Patient/family education;Ultrasound;Manual Therapy;Iontophoresis;Cryotherapy;DME and/or AE instruction;Therapeutic activities;Electrical Stimulation;Moist Heat;Passive range of motion   Plan P: Pt will benefit from skilled OT services to increase functional performance during ADL tasks while using LUE. Treatment plan: myofascial release, manual  stretching, P/ROM, A/ROM, shoulder and scapular strengthening, follow provided protocol   Clinical Decision Making Limited treatment options, no task modification necessary   OT Home Exercise Plan 05/31/17: shoulder stretches   Consulted and Agree with Plan of Care Patient      Patient will benefit from skilled therapeutic intervention in order to improve the following deficits and impairments:  Decreased strength, Impaired flexibility, Decreased mobility, Decreased range of motion, Pain,  Increased fascial restricitons, Impaired UE functional use  Visit Diagnosis: Other symptoms and signs involving the musculoskeletal system - Plan: Ot plan of care cert/re-cert  Stiffness of left shoulder, not elsewhere classified - Plan: Ot plan of care cert/re-cert  Acute pain of left shoulder - Plan: Ot plan of care cert/re-cert      G-Codes - 09/12/11 1051    Functional Assessment Tool Used (Outpatient only) FOTO score: 40/100 (60% impaired)   Functional Limitation Carrying, moving and handling objects   Carrying, Moving and Handling Objects Current Status (R9758) At least 60 percent but less than 80 percent impaired, limited or restricted   Carrying, Moving and Handling Objects Goal Status (I3254) At least 20 percent but less than 40 percent impaired, limited or restricted      Problem List Patient Active Problem List   Diagnosis Date Noted  . GERD without esophagitis 03/28/2017  . Obesity (BMI 30-39.9) 03/28/2017  . Obesity 10/06/2016  . Lumbar foraminal stenosis 06/04/2016  . Carpal tunnel syndrome, bilateral 02/07/2014  . S/P lumbar spinal fusion 02/07/2014  . Obstructive sleep apnea 02/04/2014  . Hand arthritis 10/15/2013  . HTN (hypertension), benign 08/24/2013  . Hypertriglyceridemia 08/24/2013  . Iliotibial band tendonitis 02/08/2013  . Trigger finger, acquired 02/08/2013  . Low back pain 06/22/2011  . HNP (herniated nucleus pulposus) 06/22/2011  . HIP PAIN, RIGHT 12/15/2010  . LUMBOSACRAL SPONDYLOSIS WITHOUT MYELOPATHY 12/15/2010  . SPINAL STENOSIS, LUMBAR 12/15/2010  . KNEE REPLACEMENT, HX OF 02/26/2010  . DERANGEMENT MENISCUS 07/03/2009  . KNEE, ARTHRITIS, DEGEN./OSTEO 05/22/2009  . KNEE PAIN 05/22/2009  . ANSERINE BURSITIS 05/22/2009  . TRIGGER FINGER 09/23/2008    Luther Hearing, OT Student [phone] 05/31/2017, 10:54 AM  [place], [place], [address] Phone: [phone]   Fax:   [phone]  Name: Emily [name] MRN: [id number] Date of Birth: [birth year]     This qualified practitioner was present in the room guiding the student in service delivery. Therapy student was participating in the provision of services, and the practitioner was not engaged in treating another patient or doing other tasks at the same time.  Ailene Ravel, OTR/L,CBIS  [phone]

## 2017-06-02 ENCOUNTER — Telehealth (HOSPITAL_COMMUNITY): Payer: Self-pay | Admitting: Family Medicine

## 2017-06-02 NOTE — Telephone Encounter (Signed)
06/02/17 left a message at 3:54 to see if she wanted an appt on 6/29 at 11:15

## 2017-06-03 ENCOUNTER — Ambulatory Visit (HOSPITAL_COMMUNITY): Payer: PPO | Admitting: Occupational Therapy

## 2017-06-03 DIAGNOSIS — R29898 Other symptoms and signs involving the musculoskeletal system: Secondary | ICD-10-CM | POA: Diagnosis not present

## 2017-06-03 DIAGNOSIS — M25612 Stiffness of left shoulder, not elsewhere classified: Secondary | ICD-10-CM

## 2017-06-03 DIAGNOSIS — M25512 Pain in left shoulder: Secondary | ICD-10-CM

## 2017-06-03 NOTE — Therapy (Signed)
[place], [place], [address] Phone: [phone]   Fax:  [phone]  Occupational Therapy Treatment  Patient Details  Name: Emily [name] MRN: [id number] Date of Birth: 08-06-1951 Referring Provider: Nicholes Stairs, MD  Encounter Date: 06/03/2017      OT End of Session - 06/03/17 1158    Visit Number 2   Number of Visits 12   Date for OT Re-Evaluation 07/11/17  Mini reassess: 06/28/17   Authorization Type [employer] - copay $15   Authorization Time Period before 10th visit   Authorization - Visit Number 2   Authorization - Number of Visits 10   OT Start Time 1119   OT Stop Time 1158   OT Time Calculation (min) 39 min   Equipment Utilized During Treatment N/A   Activity Tolerance Patient tolerated treatment well   Behavior During Therapy [facility] for tasks assessed/performed      Past Medical History:  Diagnosis Date  . Acid reflux   . Anxiety   . CHF (congestive heart failure) (HCC)    90's swelling body-no breathing problems at that time-no cardiac -in dr s Wolfgang [place] office [place] give lasix and potassium  . Complication of anesthesia    History of being told she was allergic to something in 1980 no problems since  . Depression   . Fibromyalgia   . History of blood transfusion reaction 1978  . History of hiatal hernia   . HNP (herniated nucleus pulposus)   . Hypertension    Does not see a cardiologist no stress or echo  . Joint pain   . Nerve pain    right leg   . Ruptured lumbar disc   . Sleep apnea    Uses CPAP every night setting at 7 mm/Hg    Past Surgical History:  Procedure Laterality Date  . BACK SURGERY     x2  . BLADDER REPAIR     tear during during birth of child  . CARPAL TUNNEL RELEASE Bilateral   . CESAREAN SECTION    . CHOLECYSTECTOMY     gallbladder disease; stayed sick  . COLONOSCOPY N/A 04/28/2016   Procedure: COLONOSCOPY;  Surgeon: [name], MD;   [facility];  Service: Endoscopy;  Laterality: N/A;  . ESOPHAGOGASTRODUODENOSCOPY N/A 04/28/2016   Procedure: ESOPHAGOGASTRODUODENOSCOPY (EGD);  Surgeon: [name], MD;  [facility];  Service: Endoscopy;  Laterality: N/A;  12:00  . GALLBLADDER SURGERY    . knee right (torn Cart)    . REPLACEMENT TOTAL KNEE     left  . ROTATOR CUFF REPAIR     right  . SPINAL FUSION    . TUBAL LIGATION    . vein right leg removed    . wrist ganglion cyst left      There were no vitals filed for this visit.      Subjective Assessment - 06/03/17 1120    Subjective  S: I exercised it this morning before I came.    Currently in Pain? Yes   Pain Score 2    Pain Location Shoulder   Pain Orientation Left   Pain Descriptors / Indicators Aching   Pain Type Acute pain   Pain Radiating Towards n/a   Pain Onset More than a month ago   Pain Frequency Constant   Aggravating Factors  too much use   Pain Relieving Factors ice   Effect of Pain on Daily Activities  minimal effect   Multiple Pain Sites No                      OT Treatments/Exercises (OP) - 06/03/17 1121      Exercises   Exercises Shoulder     Shoulder Exercises: Supine   Protraction PROM;5 reps;AROM;10 reps   Horizontal ABduction PROM;5 reps;AROM;10 reps   External Rotation PROM;5 reps;AROM;10 reps   Internal Rotation PROM;5 reps;AROM;10 reps   Flexion PROM;5 reps;AROM;10 reps   ABduction PROM;5 reps;AROM;10 reps     Shoulder Exercises: Standing   Protraction AROM;10 reps   Horizontal ABduction AROM;10 reps   External Rotation AROM;10 reps   Internal Rotation AROM;10 reps   Flexion AROM;10 reps   ABduction AROM;10 reps   Extension AROM;10 reps   Row AROM;10 reps   Shoulder Elevation AROM;10 reps     Shoulder Exercises: ROM/Strengthening   Wall Wash 1'   Thumb Tacks 1'     Manual Therapy   Manual Therapy Myofascial release   Manual therapy comments completed separately from  therapeutic exercise   Myofascial Release myofascial release to left upper arm, trapezius, and scapularis regions to decrease pain and fascial restrictions, and increase joint range of motion                  OT Short Term Goals - 06/03/17 1200      OT SHORT TERM GOAL #1   Title Pt will understand and be independent with the HEP provided to facilitate her progress with shoulder ROM and strength.   Time 3   Period Weeks   Status On-going     OT SHORT TERM GOAL #2   Title Pt will have strength of 4-/5 to assist in her left arm for ADL tasks such as holding books to read and pots during cooking.   Time 3   Period Weeks   Status On-going     OT SHORT TERM GOAL #3   Title Patients P/ROM of left arm will be within normal limits to increase her ability to complete overhead reaching activities.   Time 3   Period Weeks   Status On-going     OT SHORT TERM GOAL #4   Title Pt will have decreased fascial restrictions to min in her LUE to relieve tightness and tenderness to facilitate better ROM.   Time 3   Period Weeks   Status On-going           OT Long Term Goals - 06/03/17 1200      OT LONG TERM GOAL #1   Title Pt will return to prior level of independence with all daily and leisure activities using her left arm.   Time 6   Period Weeks   Status On-going     OT LONG TERM GOAL #2   Title Pt will have decreased fascial restrictions to zero in her LUE to facilitate better ROM. to assist in overhead reaching activities.    Time 6   Period Weeks   Status On-going     OT LONG TERM GOAL #3   Title Pt will have strength of 4+/5 to assist her left arm for ADL tasks such as cooking and sewing.   Time 6   Period Weeks   Status On-going     OT LONG TERM GOAL #4   Title Patients A/ROM of left arm will be within normal limits to increase her ability to complete all overhead reaching activities  Time 6   Period Weeks   Status On-going               Plan -  06/03/17 1158    Clinical Impression Statement A: Initiated myofascial release, manual therapy, P/ROM, A/ROM, scapular A/ROM this session. Pt with ROM WFL, tightness noted at end range. Pt required intermittent rest breaks due to fatigue, verbal cuing for form and technique. Pt reports HEP is going well.    Plan P: Continue with A/ROM, add prot/ret/dep/elev   OT Home Exercise Plan 05/31/17: shoulder stretches      Patient will benefit from skilled therapeutic intervention in order to improve the following deficits and impairments:  Decreased strength, Impaired flexibility, Decreased mobility, Decreased range of motion, Pain, Increased fascial restricitons, Impaired UE functional use  Visit Diagnosis: Other symptoms and signs involving the musculoskeletal system  Stiffness of left shoulder, not elsewhere classified  Acute pain of left shoulder    Problem List Patient Active Problem List   Diagnosis Date Noted  . GERD without esophagitis 03/28/2017  . Obesity (BMI 30-39.9) 03/28/2017  . Obesity 10/06/2016  . Lumbar foraminal stenosis 06/04/2016  . Carpal tunnel syndrome, bilateral 02/07/2014  . S/P lumbar spinal fusion 02/07/2014  . Obstructive sleep apnea 02/04/2014  . Hand arthritis 10/15/2013  . HTN (hypertension), benign 08/24/2013  . Hypertriglyceridemia 08/24/2013  . Iliotibial band tendonitis 02/08/2013  . Trigger finger, acquired 02/08/2013  . Low back pain 06/22/2011  . HNP (herniated nucleus pulposus) 06/22/2011  . HIP PAIN, RIGHT 12/15/2010  . LUMBOSACRAL SPONDYLOSIS WITHOUT MYELOPATHY 12/15/2010  . SPINAL STENOSIS, LUMBAR 12/15/2010  . KNEE REPLACEMENT, HX OF 02/26/2010  . DERANGEMENT MENISCUS 07/03/2009  . KNEE, ARTHRITIS, DEGEN./OSTEO 05/22/2009  . KNEE PAIN 05/22/2009  . ANSERINE BURSITIS 05/22/2009  . TRIGGER FINGER 09/23/2008   Guadelupe Sabin, OTR/L  [phone] 06/03/2017, 12:01 PM  [place], [place], [address] Phone: [phone]   Fax:  [phone]  Name: Emily [name] MRN: [id number] Date of Birth: 06-Jul-1951

## 2017-06-06 ENCOUNTER — Encounter (HOSPITAL_COMMUNITY): Payer: Self-pay | Admitting: Occupational Therapy

## 2017-06-06 ENCOUNTER — Ambulatory Visit (HOSPITAL_COMMUNITY): Payer: PPO | Attending: Orthopedic Surgery | Admitting: Occupational Therapy

## 2017-06-06 DIAGNOSIS — M25612 Stiffness of left shoulder, not elsewhere classified: Secondary | ICD-10-CM | POA: Diagnosis not present

## 2017-06-06 DIAGNOSIS — M25512 Pain in left shoulder: Secondary | ICD-10-CM

## 2017-06-06 DIAGNOSIS — R29898 Other symptoms and signs involving the musculoskeletal system: Secondary | ICD-10-CM

## 2017-06-06 NOTE — Therapy (Signed)
[place], [place], [address] Phone: [phone]   Fax:  [phone]  Occupational Therapy Treatment  Patient Details  Name: Emily [name] MRN: [id number] Date of Birth: March 01, 1951 Referring Provider: Nicholes Stairs, MD  Encounter Date: 06/06/2017      OT End of Session - 06/06/17 1156    Visit Number 3   Number of Visits 12   Date for OT Re-Evaluation 07/11/17  Mini reassess: 06/28/17   Authorization Type [employer] - copay $15   Authorization Time Period before 10th visit   Authorization - Visit Number 3   Authorization - Number of Visits 10   OT Start Time 1116   OT Stop Time 1156   OT Time Calculation (min) 40 min   Equipment Utilized During Treatment N/A   Activity Tolerance Patient tolerated treatment well   Behavior During Therapy [facility] for tasks assessed/performed      Past Medical History:  Diagnosis Date  . Acid reflux   . Anxiety   . CHF (congestive heart failure) (HCC)    90's swelling body-no breathing problems at that time-no cardiac -in dr s Wolfgang [place] office [facility] give lasix and potassium  . Complication of anesthesia    History of being told she was allergic to something in 1980 no problems since  . Depression   . Fibromyalgia   . History of blood transfusion reaction 1978  . History of hiatal hernia   . HNP (herniated nucleus pulposus)   . Hypertension    Does not see a cardiologist no stress or echo  . Joint pain   . Nerve pain    right leg   . Ruptured lumbar disc   . Sleep apnea    Uses CPAP every night setting at 7 mm/Hg    Past Surgical History:  Procedure Laterality Date  . BACK SURGERY     x2  . BLADDER REPAIR     tear during during birth of child  . CARPAL TUNNEL RELEASE Bilateral   . CESAREAN SECTION    . CHOLECYSTECTOMY     gallbladder disease; stayed sick  . COLONOSCOPY N/A 04/28/2016   Procedure: COLONOSCOPY;  Surgeon: [name], MD;   [facility];  Service: Endoscopy;  Laterality: N/A;  . ESOPHAGOGASTRODUODENOSCOPY N/A 04/28/2016   Procedure: ESOPHAGOGASTRODUODENOSCOPY (EGD);  Surgeon: [name], MD;  [facility];  Service: Endoscopy;  Laterality: N/A;  12:00  . GALLBLADDER SURGERY    . knee right (torn Cart)    . REPLACEMENT TOTAL KNEE     left  . ROTATOR CUFF REPAIR     right  . SPINAL FUSION    . TUBAL LIGATION    . vein right leg removed    . wrist ganglion cyst left      There were no vitals filed for this visit.      Subjective Assessment - 06/06/17 1115    Subjective  S: It's been sore since last time.    Currently in Pain? Yes   Pain Score 3    Pain Location Shoulder   Pain Orientation Left   Pain Descriptors / Indicators Aching   Pain Type Acute pain   Pain Radiating Towards n/a   Pain Onset More than a month ago   Pain Frequency Intermittent   Aggravating Factors  too much use   Pain Relieving Factors ice   Effect of Pain on Daily Activities minimal effect  Multiple Pain Sites No            [facility] OT Assessment - 06/06/17 1115      Assessment   Diagnosis left shoulder arthroscopy     Precautions   Precautions Shoulder   Type of Shoulder Precautions Week 6-12 (05/19/17-06/30/17): A/ROM, scapular strengthening, when ROM is full begin light strengthening, theraband exercises in pain free ROM; Week 12+ (06/30/17): full strengthening, work on shoulder stability and endurance, avoid heavy strengthening with arm abducted, avoid empty can motion, always completed activities with thumb up                  OT Treatments/Exercises (OP) - 06/06/17 1119      Exercises   Exercises Shoulder     Shoulder Exercises: Supine   Protraction PROM;5 reps;AROM;10 reps   Horizontal ABduction PROM;5 reps;AROM;10 reps   External Rotation PROM;5 reps;AROM;10 reps   Internal Rotation PROM;5 reps;AROM;10 reps   Flexion PROM;5 reps;AROM;10 reps   ABduction PROM;5  reps;AROM;10 reps     Shoulder Exercises: Standing   Protraction AROM;10 reps   Horizontal ABduction AROM;10 reps   External Rotation AROM;10 reps   Internal Rotation AROM;10 reps   Flexion AROM;10 reps   ABduction AROM;10 reps   Extension AROM;10 reps   Row AROM;10 reps   Shoulder Elevation AROM;10 reps     Shoulder Exercises: Pulleys   Flexion 1 minute   ABduction 1 minute     Shoulder Exercises: ROM/Strengthening   Wall Wash 1'   Thumb Tacks 1'   Proximal Shoulder Strengthening, Supine 10X each no rest breaks   Prot/Ret//Elev/Dep 1'     Shoulder Exercises: Stretch   Corner Stretch 2 reps;20 seconds   Wall Stretch - Flexion 2 reps;20 seconds     Manual Therapy   Manual Therapy Myofascial release   Manual therapy comments completed separately from therapeutic exercise   Myofascial Release myofascial release to left upper arm, trapezius, and scapularis regions to decrease pain and fascial restrictions, and increase joint range of motion                  OT Short Term Goals - 06/03/17 1200      OT SHORT TERM GOAL #1   Title Pt will understand and be independent with the HEP provided to facilitate her progress with shoulder ROM and strength.   Time 3   Period Weeks   Status On-going     OT SHORT TERM GOAL #2   Title Pt will have strength of 4-/5 to assist in her left arm for ADL tasks such as holding books to read and pots during cooking.   Time 3   Period Weeks   Status On-going     OT SHORT TERM GOAL #3   Title Patients P/ROM of left arm will be within normal limits to increase her ability to complete overhead reaching activities.   Time 3   Period Weeks   Status On-going     OT SHORT TERM GOAL #4   Title Pt will have decreased fascial restrictions to min in her LUE to relieve tightness and tenderness to facilitate better ROM.   Time 3   Period Weeks   Status On-going           OT Long Term Goals - 06/03/17 1200      OT LONG TERM GOAL #1    Title Pt will return to prior level of independence with all daily and leisure activities using  her left arm.   Time 6   Period Weeks   Status On-going     OT LONG TERM GOAL #2   Title Pt will have decreased fascial restrictions to zero in her LUE to facilitate better ROM. to assist in overhead reaching activities.    Time 6   Period Weeks   Status On-going     OT LONG TERM GOAL #3   Title Pt will have strength of 4+/5 to assist her left arm for ADL tasks such as cooking and sewing.   Time 6   Period Weeks   Status On-going     OT LONG TERM GOAL #4   Title Patients A/ROM of left arm will be within normal limits to increase her ability to complete all overhead reaching activities   Time 6   Period Weeks   Status On-going               Plan - 06/06/17 1157    Clinical Impression Statement A: Continued with A/ROM exercises, added prot/ret/elev/dep, shoulder stretches, and pulleys this session. Pt requires intermittent verbal cuing for form and technique, rest breaks for fatigue. Pt reports she has pulleys at home and will add to HEP.   Plan P: Continue with A/ROM working on form in standing, add rhythmic stabilization exercises in supine. Update HEP for A/ROM   OT Home Exercise Plan 05/31/17: shoulder stretches   Consulted and Agree with Plan of Care Patient      Patient will benefit from skilled therapeutic intervention in order to improve the following deficits and impairments:  Decreased strength, Impaired flexibility, Decreased mobility, Decreased range of motion, Pain, Increased fascial restricitons, Impaired UE functional use  Visit Diagnosis: Other symptoms and signs involving the musculoskeletal system  Stiffness of left shoulder, not elsewhere classified  Acute pain of left shoulder    Problem List Patient Active Problem List   Diagnosis Date Noted  . GERD without esophagitis 03/28/2017  . Obesity (BMI 30-39.9) 03/28/2017  . Obesity 10/06/2016  . Lumbar  foraminal stenosis 06/04/2016  . Carpal tunnel syndrome, bilateral 02/07/2014  . S/P lumbar spinal fusion 02/07/2014  . Obstructive sleep apnea 02/04/2014  . Hand arthritis 10/15/2013  . HTN (hypertension), benign 08/24/2013  . Hypertriglyceridemia 08/24/2013  . Iliotibial band tendonitis 02/08/2013  . Trigger finger, acquired 02/08/2013  . Low back pain 06/22/2011  . HNP (herniated nucleus pulposus) 06/22/2011  . HIP PAIN, RIGHT 12/15/2010  . LUMBOSACRAL SPONDYLOSIS WITHOUT MYELOPATHY 12/15/2010  . SPINAL STENOSIS, LUMBAR 12/15/2010  . KNEE REPLACEMENT, HX OF 02/26/2010  . DERANGEMENT MENISCUS 07/03/2009  . KNEE, ARTHRITIS, DEGEN./OSTEO 05/22/2009  . KNEE PAIN 05/22/2009  . ANSERINE BURSITIS 05/22/2009  . TRIGGER FINGER 09/23/2008   Guadelupe Sabin, OTR/L  [phone] 06/06/2017, 12:00 PM  [place], [place], [address] Phone: [phone]   Fax:  [phone]  Name: Emily [name] MRN: [id number] Date of Birth: June 12, 1951

## 2017-06-09 ENCOUNTER — Telehealth (HOSPITAL_COMMUNITY): Payer: Self-pay | Admitting: Family Medicine

## 2017-06-09 ENCOUNTER — Ambulatory Visit (HOSPITAL_COMMUNITY): Payer: PPO | Admitting: Occupational Therapy

## 2017-06-09 NOTE — Telephone Encounter (Signed)
06/09/17  pt left a message to cx - had a family emergency

## 2017-06-10 ENCOUNTER — Ambulatory Visit (INDEPENDENT_AMBULATORY_CARE_PROVIDER_SITE_OTHER): Payer: PPO

## 2017-06-10 ENCOUNTER — Encounter: Payer: Self-pay | Admitting: Orthopedic Surgery

## 2017-06-10 ENCOUNTER — Ambulatory Visit (INDEPENDENT_AMBULATORY_CARE_PROVIDER_SITE_OTHER): Payer: PPO | Admitting: Orthopedic Surgery

## 2017-06-10 DIAGNOSIS — G8929 Other chronic pain: Secondary | ICD-10-CM

## 2017-06-10 DIAGNOSIS — M25561 Pain in right knee: Secondary | ICD-10-CM | POA: Diagnosis not present

## 2017-06-10 NOTE — Progress Notes (Signed)
Patient ID: [name], female   DOB: [birth year], 66 y.o.   MRN: [id number]  Chief Complaint  Patient presents with  . Follow-up    RIGHT KNEE    Emily [name] comes in for follow-up regarding her right knee  66 year old female status post left total knee did well, status post lumbar fusion with some residual anterior and lateral right thigh numbness presents for evaluation of her right knee with medial and lateral pain crepitance times several years with recent increase in pain after slipping sensation felt in the knee when she got up from a seated position. She has lateral pain and posterior pain  She reports no catching locking or giving way episodes recently    Review of Systems  Musculoskeletal:       Dr. Mardelle Matte did a left shoulder rotator cuff repair and distal clavicle excision 2 months ago, she is in therapy and she says her shoulder is functioning very well      There were no vitals taken for this visit. Gen. appearance is normal grooming and hygiene normal Orientation to person place and time normal Mood normal   Ortho Exam Right knee   She is walking very well no assistive devices her range of motion is from 0-125 degrees she has no pain tenderness or swelling   The x-ray we took today shows valgus arthritis of the knee no progression   A/P  Medical decision-making  Encounter Diagnosis  Name Primary?  . Chronic pain of right knee Yes   Recommend x-ray again in 6 months  Arther Abbott, MD 06/10/2017 10:52 AM

## 2017-06-15 ENCOUNTER — Encounter (HOSPITAL_COMMUNITY): Payer: Self-pay | Admitting: Occupational Therapy

## 2017-06-15 ENCOUNTER — Ambulatory Visit (HOSPITAL_COMMUNITY): Payer: PPO | Admitting: Occupational Therapy

## 2017-06-15 DIAGNOSIS — R29898 Other symptoms and signs involving the musculoskeletal system: Secondary | ICD-10-CM

## 2017-06-15 DIAGNOSIS — M25512 Pain in left shoulder: Secondary | ICD-10-CM

## 2017-06-15 DIAGNOSIS — M25612 Stiffness of left shoulder, not elsewhere classified: Secondary | ICD-10-CM

## 2017-06-15 NOTE — Patient Instructions (Signed)

## 2017-06-15 NOTE — Therapy (Signed)
[name], [place], [address] Phone: [phone]   Fax:  [phone]  Occupational Therapy Treatment  Patient Details  Name: Emily [name] MRN: [id number] Date of Birth: June 16, 1951 Referring Provider: Nicholes Stairs, MD  Encounter Date: 06/15/2017      OT End of Session - 06/15/17 1201    Visit Number 4   Number of Visits 12   Date for OT Re-Evaluation 07/11/17  Mini reassess: 06/28/17   Authorization Type [employer] - copay $15   Authorization Time Period before 10th visit   Authorization - Visit Number 4   Authorization - Number of Visits 10   OT Start Time 1113   OT Stop Time 1157   OT Time Calculation (min) 44 min   Equipment Utilized During Treatment N/A   Activity Tolerance Patient tolerated treatment well   Behavior During Therapy [facility] for tasks assessed/performed      Past Medical History:  Diagnosis Date  . Acid reflux   . Anxiety   . CHF (congestive heart failure) (HCC)    90's swelling body-no breathing problems at that time-no cardiac -in dr s Wolfgang [place] office [place] give lasix and potassium  . Complication of anesthesia    History of being told she was allergic to something in 1980 no problems since  . Depression   . Fibromyalgia   . History of blood transfusion reaction 1978  . History of hiatal hernia   . HNP (herniated nucleus pulposus)   . Hypertension    Does not see a cardiologist no stress or echo  . Joint pain   . Nerve pain    right leg   . Ruptured lumbar disc   . Sleep apnea    Uses CPAP every night setting at 7 mm/Hg    Past Surgical History:  Procedure Laterality Date  . BACK SURGERY     x2  . BLADDER REPAIR     tear during during birth of child  . CARPAL TUNNEL RELEASE Bilateral   . CESAREAN SECTION    . CHOLECYSTECTOMY     gallbladder disease; stayed sick  . COLONOSCOPY N/A 04/28/2016   Procedure: COLONOSCOPY;  Surgeon: [name], MD;   [facility];  Service: Endoscopy;  Laterality: N/A;  . ESOPHAGOGASTRODUODENOSCOPY N/A 04/28/2016   Procedure: ESOPHAGOGASTRODUODENOSCOPY (EGD);  Surgeon: [name], MD;  [facility];  Service: Endoscopy;  Laterality: N/A;  12:00  . GALLBLADDER SURGERY    . knee right (torn Cart)    . REPLACEMENT TOTAL KNEE     left  . ROTATOR CUFF REPAIR     right  . SPINAL FUSION    . TUBAL LIGATION    . vein right leg removed    . wrist ganglion cyst left      There were no vitals filed for this visit.      Subjective Assessment - 06/15/17 1112    Subjective  S: I can fix my hair now with less pain.    Currently in Pain? Yes   Pain Score 2    Pain Location Shoulder   Pain Orientation Left   Pain Descriptors / Indicators Aching   Pain Type Acute pain   Pain Radiating Towards n/a   Pain Onset More than a month ago   Pain Frequency Intermittent   Aggravating Factors  too much use   Pain Relieving Factors ice; Tylenol   Effect of Pain on  Daily Activities minimal effect and ADL completion   Multiple Pain Sites No            [facility] OT Assessment - 06/15/17 1112      Assessment   Diagnosis left shoulder arthroscopy     Precautions   Precautions Shoulder   Type of Shoulder Precautions Week 6-12 (05/19/17-06/30/17): A/ROM, scapular strengthening, when ROM is full begin light strengthening, theraband exercises in pain free ROM; Week 12+ (06/30/17): full strengthening, work on shoulder stability and endurance, avoid heavy strengthening with arm abducted, avoid empty can motion, always completed activities with thumb up                  OT Treatments/Exercises (OP) - 06/15/17 1117      Exercises   Exercises Shoulder     Shoulder Exercises: Supine   Protraction PROM;5 reps;AROM;12 reps   Horizontal ABduction PROM;5 reps;AROM;12 reps   External Rotation PROM;5 reps;AROM;12 reps   Internal Rotation PROM;5 reps;AROM;12 reps   Flexion PROM;5  reps;AROM;12 reps   ABduction PROM;5 reps;AROM;12 reps     Shoulder Exercises: Standing   Protraction AROM;12 reps   Horizontal ABduction AROM;12 reps   External Rotation AROM;12 reps   Internal Rotation AROM;12 reps   Flexion AROM;12 reps   ABduction AROM;12 reps   Extension Theraband;10 reps   Theraband Level (Shoulder Extension) Level 2 (Red)   Row Theraband;10 reps   Theraband Level (Shoulder Row) Level 2 (Red)     Shoulder Exercises: ROM/Strengthening   Wall Wash 1'   Thumb Tacks 1'   Proximal Shoulder Strengthening, Supine 10X each no rest breaks   Proximal Shoulder Strengthening, Seated 10X each no rest breaks   Rhythmic Stabilization, Supine 45 degrees and 90 degrees, 30 seconds, min difficulty     Manual Therapy   Manual Therapy Myofascial release   Manual therapy comments completed separately from therapeutic exercise   Myofascial Release myofascial release to left upper arm, trapezius, and scapularis regions to decrease pain and fascial restrictions, and increase joint range of motion                OT Education - 06/15/17 1144    Education provided Yes   Education Details A/ROM shoulder   Person(s) Educated Patient   Methods Explanation;Demonstration;Handout   Comprehension Verbalized understanding;Returned demonstration          OT Short Term Goals - 06/03/17 1200      OT SHORT TERM GOAL #1   Title Pt will understand and be independent with the HEP provided to facilitate her progress with shoulder ROM and strength.   Time 3   Period Weeks   Status On-going     OT SHORT TERM GOAL #2   Title Pt will have strength of 4-/5 to assist in her left arm for ADL tasks such as holding books to read and pots during cooking.   Time 3   Period Weeks   Status On-going     OT SHORT TERM GOAL #3   Title Patients P/ROM of left arm will be within normal limits to increase her ability to complete overhead reaching activities.   Time 3   Period Weeks   Status  On-going     OT SHORT TERM GOAL #4   Title Pt will have decreased fascial restrictions to min in her LUE to relieve tightness and tenderness to facilitate better ROM.   Time 3   Period Weeks   Status On-going  OT Long Term Goals - 06/03/17 1200      OT LONG TERM GOAL #1   Title Pt will return to prior level of independence with all daily and leisure activities using her left arm.   Time 6   Period Weeks   Status On-going     OT LONG TERM GOAL #2   Title Pt will have decreased fascial restrictions to zero in her LUE to facilitate better ROM. to assist in overhead reaching activities.    Time 6   Period Weeks   Status On-going     OT LONG TERM GOAL #3   Title Pt will have strength of 4+/5 to assist her left arm for ADL tasks such as cooking and sewing.   Time 6   Period Weeks   Status On-going     OT LONG TERM GOAL #4   Title Patients A/ROM of left arm will be within normal limits to increase her ability to complete all overhead reaching activities   Time 6   Period Weeks   Status On-going               Plan - 06/15/17 1201    Clinical Impression Statement A: Continued with A/ROM, increasing repetitions to 12. Added proximal shoulder strengthening in sitting, added scapular theraband extension and row. Pt required intermittent rest breaks for fatigue, verbal cuing for form. Updated HEP for A/ROM.    Plan P: Follow up on HEP, add scapular theraband retraction. Add UBE   OT Home Exercise Plan 05/31/17: shoulder stretches; 7/11: A/ROM shoulder   Consulted and Agree with Plan of Care Patient      Patient will benefit from skilled therapeutic intervention in order to improve the following deficits and impairments:  Decreased strength, Impaired flexibility, Decreased mobility, Decreased range of motion, Pain, Increased fascial restricitons, Impaired UE functional use  Visit Diagnosis: Other symptoms and signs involving the musculoskeletal system  Stiffness  of left shoulder, not elsewhere classified  Acute pain of left shoulder    Problem List Patient Active Problem List   Diagnosis Date Noted  . GERD without esophagitis 03/28/2017  . Obesity (BMI 30-39.9) 03/28/2017  . Obesity 10/06/2016  . Lumbar foraminal stenosis 06/04/2016  . Carpal tunnel syndrome, bilateral 02/07/2014  . S/P lumbar spinal fusion 02/07/2014  . Obstructive sleep apnea 02/04/2014  . Hand arthritis 10/15/2013  . HTN (hypertension), benign 08/24/2013  . Hypertriglyceridemia 08/24/2013  . Iliotibial band tendonitis 02/08/2013  . Trigger finger, acquired 02/08/2013  . Low back pain 06/22/2011  . HNP (herniated nucleus pulposus) 06/22/2011  . HIP PAIN, RIGHT 12/15/2010  . LUMBOSACRAL SPONDYLOSIS WITHOUT MYELOPATHY 12/15/2010  . SPINAL STENOSIS, LUMBAR 12/15/2010  . KNEE REPLACEMENT, HX OF 02/26/2010  . DERANGEMENT MENISCUS 07/03/2009  . KNEE, ARTHRITIS, DEGEN./OSTEO 05/22/2009  . KNEE PAIN 05/22/2009  . ANSERINE BURSITIS 05/22/2009  . TRIGGER FINGER 09/23/2008    Guadelupe Sabin, OTR/L  [phone] 06/15/2017, 12:04 PM  [place], [place], [address] Phone: [phone]   Fax:  [phone]  Name: KYOMI HECTOR MRN: [id number] Date of Birth: [birth year]

## 2017-06-17 ENCOUNTER — Ambulatory Visit (HOSPITAL_COMMUNITY): Payer: PPO

## 2017-06-17 ENCOUNTER — Encounter (HOSPITAL_COMMUNITY): Payer: Self-pay

## 2017-06-17 DIAGNOSIS — M25612 Stiffness of left shoulder, not elsewhere classified: Secondary | ICD-10-CM

## 2017-06-17 DIAGNOSIS — R29898 Other symptoms and signs involving the musculoskeletal system: Secondary | ICD-10-CM

## 2017-06-17 DIAGNOSIS — M25512 Pain in left shoulder: Secondary | ICD-10-CM

## 2017-06-17 NOTE — Therapy (Addendum)
[place], [place], [address] Phone: [phone]   Fax:  [phone]  Occupational Therapy Treatment  Patient Details  Name: Emily [name] MRN: [id number] Date of Birth: Apr 19, 1951 Referring Provider: Nicholes Stairs, MD  Encounter Date: 06/17/2017      OT End of Session - 06/17/17 1409    Visit Number 5   Number of Visits 12   Date for OT Re-Evaluation 07/11/17  Mini reassess: 06/28/17   Authorization Type [employer] - copay $15   Authorization Time Period before 10th visit   Authorization - Visit Number 5   Authorization - Number of Visits 10   OT Start Time 1301   OT Stop Time 1345   OT Time Calculation (min) 44 min   Equipment Utilized During Treatment N/A   Activity Tolerance Patient tolerated treatment well   Behavior During Therapy [facility] for tasks assessed/performed      Past Medical History:  Diagnosis Date  . Acid reflux   . Anxiety   . CHF (congestive heart failure) (HCC)    90's swelling body-no breathing problems at that time-no cardiac -in dr s Wolfgang [place] office  give lasix and potassium  . Complication of anesthesia    History of being told she was allergic to something in 1980 no problems since  . Depression   . Fibromyalgia   . History of blood transfusion reaction 1978  . History of hiatal hernia   . HNP (herniated nucleus pulposus)   . Hypertension    Does not see a cardiologist no stress or echo  . Joint pain   . Nerve pain    right leg   . Ruptured lumbar disc   . Sleep apnea    Uses CPAP every night setting at 7 mm/Hg    Past Surgical History:  Procedure Laterality Date  . BACK SURGERY     x2  . BLADDER REPAIR     tear during during birth of child  . CARPAL TUNNEL RELEASE Bilateral   . CESAREAN SECTION    . CHOLECYSTECTOMY     gallbladder disease; stayed sick  . COLONOSCOPY N/A 04/28/2016   Procedure: COLONOSCOPY;  Surgeon: [name], MD;   [facility];  Service: Endoscopy;  Laterality: N/A;  . ESOPHAGOGASTRODUODENOSCOPY N/A 04/28/2016   Procedure: ESOPHAGOGASTRODUODENOSCOPY (EGD);  Surgeon: [name], MD;  [facility];  Service: Endoscopy;  Laterality: N/A;  12:00  . GALLBLADDER SURGERY    . knee right (torn Cart)    . REPLACEMENT TOTAL KNEE     left  . ROTATOR CUFF REPAIR     right  . SPINAL FUSION    . TUBAL LIGATION    . vein right leg removed    . wrist ganglion cyst left      There were no vitals filed for this visit.      Subjective Assessment - 06/17/17 1326    Subjective  S: The only motion I'm still having trouble with is lifting this way (abduction).   Currently in Pain? Yes   Pain Score 2    Pain Location Shoulder   Pain Orientation Left   Pain Descriptors / Indicators Aching   Pain Type Acute pain   Pain Radiating Towards n/a   Pain Onset More than a month ago   Pain Frequency Intermittent   Aggravating Factors  too much use   Pain Relieving Factors ice, tylenol   Effect  of Pain on Daily Activities minimal effect   Multiple Pain Sites No            [facility] OT Assessment - 06/17/17 1319      Assessment   Diagnosis left shoulder arthroscopy     Precautions   Precautions Shoulder   Type of Shoulder Precautions Week 6-12 (05/19/17-06/30/17): A/ROM, scapular strengthening, when ROM is full begin light strengthening, theraband exercises in pain free ROM; Week 12+ (06/30/17): full strengthening, work on shoulder stability and endurance, avoid heavy strengthening with arm abducted, avoid empty can motion, always completed activities with thumb up                  OT Treatments/Exercises (OP) - 06/17/17 1318      Exercises   Exercises Shoulder     Shoulder Exercises: Supine   Protraction PROM;5 reps;AROM;12 reps   Horizontal ABduction PROM;5 reps;AROM;12 reps   External Rotation PROM;5 reps;AROM;12 reps   Internal Rotation PROM;5 reps;AROM;12 reps    Flexion PROM;5 reps;AROM;12 reps   ABduction PROM;5 reps;AROM;12 reps     Shoulder Exercises: Standing   Protraction AROM;12 reps   Horizontal ABduction AROM;12 reps   External Rotation AROM;12 reps   Internal Rotation AROM;12 reps   Flexion AROM;12 reps   ABduction AROM;12 reps   Extension Theraband;10 reps   Theraband Level (Shoulder Extension) Level 2 (Red)   Row Theraband;10 reps   Theraband Level (Shoulder Row) Level 2 (Red)   Retraction Theraband;10 reps   Theraband Level (Shoulder Retraction) Level 2 (Red)     Shoulder Exercises: ROM/Strengthening   UBE (Upper Arm Bike) level 1, 2' forwards, 2' backwards   Wall Wash 1'   Thumb Tacks 1'   Over Head Lace 1'   X to V Arms 10X-breaks needed  mod difficulty   Proximal Shoulder Strengthening, Supine 12X each no rest breaks   Proximal Shoulder Strengthening, Seated 12X each no rest breaks   Ball on Wall 1' flexion     Manual Therapy   Manual Therapy Myofascial release   Manual therapy comments completed separately from therapeutic exercise   Myofascial Release myofascial release to left upper arm, trapezius, and scapularis regions to decrease pain and fascial restrictions, and increase joint range of motion                OT Education - 06/17/17 1318    Education provided No          OT Short Term Goals - 06/03/17 1200      OT SHORT TERM GOAL #1   Title Pt will understand and be independent with the HEP provided to facilitate her progress with shoulder ROM and strength.   Time 3   Period Weeks   Status On-going     OT SHORT TERM GOAL #2   Title Pt will have strength of 4-/5 to assist in her left arm for ADL tasks such as holding books to read and pots during cooking.   Time 3   Period Weeks   Status On-going     OT SHORT TERM GOAL #3   Title Patients P/ROM of left arm will be within normal limits to increase her ability to complete overhead reaching activities.   Time 3   Period Weeks   Status  On-going     OT SHORT TERM GOAL #4   Title Pt will have decreased fascial restrictions to min in her LUE to relieve tightness and tenderness to facilitate better ROM.  Time 3   Period Weeks   Status On-going           OT Long Term Goals - 06/03/17 1200      OT LONG TERM GOAL #1   Title Pt will return to prior level of independence with all daily and leisure activities using her left arm.   Time 6   Period Weeks   Status On-going     OT LONG TERM GOAL #2   Title Pt will have decreased fascial restrictions to zero in her LUE to facilitate better ROM. to assist in overhead reaching activities.    Time 6   Period Weeks   Status On-going     OT LONG TERM GOAL #3   Title Pt will have strength of 4+/5 to assist her left arm for ADL tasks such as cooking and sewing.   Time 6   Period Weeks   Status On-going     OT LONG TERM GOAL #4   Title Patients A/ROM of left arm will be within normal limits to increase her ability to complete all overhead reaching activities   Time 6   Period Weeks   Status On-going               Plan - 06/17/17 1411    Clinical Impression Statement A: Pt tolerated session very well. Added ball on the wall and overhead lacing. Increased shoulder A/ROM and proximal shoulder strengthening repetitions. Completed scapular theraband exercises and X to V arms with VC needed for form and technique. X to V arms presented as mod difficulty.    Plan P: Add scapular theraband to HEP if pt presents with good form. Follow up on Dr. appt from 06/20/17. Continue with MD protocol, progress as tolerated.      Patient will benefit from skilled therapeutic intervention in order to improve the following deficits and impairments:  Decreased strength, Impaired flexibility, Decreased mobility, Decreased range of motion, Pain, Increased fascial restricitons, Impaired UE functional use  Visit Diagnosis: Other symptoms and signs involving the musculoskeletal  system  Stiffness of left shoulder, not elsewhere classified  Acute pain of left shoulder    Problem List Patient Active Problem List   Diagnosis Date Noted  . GERD without esophagitis 03/28/2017  . Obesity (BMI 30-39.9) 03/28/2017  . Obesity 10/06/2016  . Lumbar foraminal stenosis 06/04/2016  . Carpal tunnel syndrome, bilateral 02/07/2014  . S/P lumbar spinal fusion 02/07/2014  . Obstructive sleep apnea 02/04/2014  . Hand arthritis 10/15/2013  . HTN (hypertension), benign 08/24/2013  . Hypertriglyceridemia 08/24/2013  . Iliotibial band tendonitis 02/08/2013  . Trigger finger, acquired 02/08/2013  . Low back pain 06/22/2011  . HNP (herniated nucleus pulposus) 06/22/2011  . HIP PAIN, RIGHT 12/15/2010  . LUMBOSACRAL SPONDYLOSIS WITHOUT MYELOPATHY 12/15/2010  . SPINAL STENOSIS, LUMBAR 12/15/2010  . KNEE REPLACEMENT, HX OF 02/26/2010  . DERANGEMENT MENISCUS 07/03/2009  . KNEE, ARTHRITIS, DEGEN./OSTEO 05/22/2009  . KNEE PAIN 05/22/2009  . ANSERINE BURSITIS 05/22/2009  . TRIGGER FINGER 09/23/2008    Luther Hearing, OT Student [phone] 06/17/2017, 2:32 PM  [place] [address] [place], [place], [address] Phone: [phone]   Fax:  [phone]  Name: KARIANNA GUSMAN MRN: [id number] Date of Birth: 12-Aug-1951     This qualified practitioner was present in the room guiding the student in service delivery. Therapy student was participating in the provision of services, and the practitioner was not engaged in treating another patient or doing  other tasks at the same time.  Ailene Ravel, OTR/L,CBIS  [phone]   OCCUPATIONAL THERAPY DISCHARGE SUMMARY (07/12/17)  Visits from Start of Care: 5  Current functional level related to goals / functional outcomes: Pt was seen by referring MD and told that she did not need to continue therapy.   Remaining deficits: No deficits per patient   Education / Equipment: No  given at last tx session. Plan: Patient agrees to discharge.  Patient goals were not met. Patient is being discharged due to the physician's request.  ?????         Ailene Ravel, OTR/L,CBIS  [phone]

## 2017-06-20 ENCOUNTER — Telehealth (HOSPITAL_COMMUNITY): Payer: Self-pay | Admitting: Family Medicine

## 2017-06-20 DIAGNOSIS — Z79899 Other long term (current) drug therapy: Secondary | ICD-10-CM | POA: Diagnosis not present

## 2017-06-20 DIAGNOSIS — Z113 Encounter for screening for infections with a predominantly sexual mode of transmission: Secondary | ICD-10-CM | POA: Diagnosis not present

## 2017-06-20 DIAGNOSIS — E781 Pure hyperglyceridemia: Secondary | ICD-10-CM | POA: Diagnosis not present

## 2017-06-20 NOTE — Telephone Encounter (Signed)
06/21/15  pt called and said she saw Dr. Mardelle Matte today and he told her she didn't need anymore therapy

## 2017-06-21 ENCOUNTER — Ambulatory Visit (HOSPITAL_COMMUNITY): Payer: PPO

## 2017-06-21 LAB — BASIC METABOLIC PANEL
BUN / CREAT RATIO: 17 (ref 12–28)
BUN: 12 mg/dL (ref 8–27)
CHLORIDE: 100 mmol/L (ref 96–106)
CO2: 23 mmol/L (ref 20–29)
Calcium: 9.7 mg/dL (ref 8.7–10.3)
Creatinine, Ser: 0.72 mg/dL (ref 0.57–1.00)
GFR calc Af Amer: 102 mL/min/{1.73_m2} (ref >59)
GFR calc non Af Amer: 89 mL/min/{1.73_m2} (ref >59)
GLUCOSE: 97 mg/dL (ref 65–99)
POTASSIUM: 4.2 mmol/L (ref 3.5–5.2)
SODIUM: 144 mmol/L (ref 134–144)

## 2017-06-21 LAB — HEPATITIS C ANTIBODY

## 2017-06-21 LAB — HIV ANTIBODY (ROUTINE TESTING W REFLEX): HIV Screen 4th Generation wRfx: NONREACTIVE

## 2017-06-21 LAB — LIPID PANEL
CHOL/HDL RATIO: 4.4 ratio (ref 0.0–4.4)
CHOLESTEROL TOTAL: 213 mg/dL — AB (ref 100–199)
HDL: 48 mg/dL (ref >39)
LDL CALC: 131 mg/dL — AB (ref 0–99)
Triglycerides: 170 mg/dL — ABNORMAL HIGH (ref 0–149)
VLDL Cholesterol Cal: 34 mg/dL (ref 5–40)

## 2017-06-23 ENCOUNTER — Ambulatory Visit (HOSPITAL_COMMUNITY): Payer: PPO

## 2017-06-27 ENCOUNTER — Ambulatory Visit (HOSPITAL_COMMUNITY): Payer: PPO

## 2017-06-30 ENCOUNTER — Ambulatory Visit (HOSPITAL_COMMUNITY): Payer: PPO

## 2017-07-01 MED ORDER — PRAVASTATIN SODIUM 20 MG PO TABS: 20.0000 mg | ORAL_TABLET | Freq: Every day | ORAL | 5 refills | Status: DC

## 2017-07-01 NOTE — Addendum Note (Signed)
Addended by: Ofilia Neas R on: 07/01/2017 10:39 AM   Modules accepted: Orders

## 2017-07-04 DIAGNOSIS — X32XXXA Exposure to sunlight, initial encounter: Secondary | ICD-10-CM | POA: Diagnosis not present

## 2017-07-04 DIAGNOSIS — L738 Other specified follicular disorders: Secondary | ICD-10-CM | POA: Diagnosis not present

## 2017-07-04 DIAGNOSIS — L304 Erythema intertrigo: Secondary | ICD-10-CM | POA: Diagnosis not present

## 2017-07-04 DIAGNOSIS — L820 Inflamed seborrheic keratosis: Secondary | ICD-10-CM | POA: Diagnosis not present

## 2017-07-04 DIAGNOSIS — L570 Actinic keratosis: Secondary | ICD-10-CM | POA: Diagnosis not present

## 2017-07-11 ENCOUNTER — Telehealth: Payer: Self-pay | Admitting: Family Medicine

## 2017-07-11 NOTE — Telephone Encounter (Signed)
Patient is requesting new Rx for cpap mask and tubing.   She said she also may need to schedule for a sleep study.  Please advise.  She said she had her last sleep study around 2007 with Dr. Merlene Laughter.

## 2017-07-11 NOTE — Telephone Encounter (Signed)
It would be fine to go ahead and write the prescription for the tubing and mask for her CPAP machine. Let the patient know that when she comes in on Thursday for her office visit we will have further discussion regarding her sleep apnea and whether or not to do it an up-to-date test

## 2017-07-12 NOTE — Telephone Encounter (Signed)
Discussed with pt. Pt wanted to pick up script. Script at front for pickup.

## 2017-07-12 NOTE — Telephone Encounter (Signed)
Left message to return call 

## 2017-07-14 ENCOUNTER — Ambulatory Visit (INDEPENDENT_AMBULATORY_CARE_PROVIDER_SITE_OTHER): Payer: PPO | Admitting: Family Medicine

## 2017-07-14 VITALS — BP 120/78 | Ht 62.0 in | Wt 175.0 lb

## 2017-07-14 DIAGNOSIS — R0600 Dyspnea, unspecified: Secondary | ICD-10-CM

## 2017-07-14 DIAGNOSIS — R0609 Other forms of dyspnea: Secondary | ICD-10-CM | POA: Diagnosis not present

## 2017-07-14 DIAGNOSIS — I10 Essential (primary) hypertension: Secondary | ICD-10-CM | POA: Diagnosis not present

## 2017-07-14 DIAGNOSIS — G4730 Sleep apnea, unspecified: Secondary | ICD-10-CM | POA: Diagnosis not present

## 2017-07-14 NOTE — Patient Instructions (Signed)
As part of today's visit a referral has been made. This is a process that is handled by our clinical referral specialists. This process requires that we send your medical information to the specialists for their review before they will issue you an appointment. Unfortunately this does take time and much of this process is under the responsibility of the specialists. For emergent referrals we do our best to speed up this process.Our referral specialist will make certain that your insurance company is notified as well as the physician group that we are referring you to for your problem. Emergent referrals are made as quick as possible. Most standard referrals often take 7 days before we hear from the specialists office when they can see you. If you have not heard when your appointment is from [place] or the referral specialists within 7 days please call us regarding this referral.

## 2017-07-14 NOTE — Progress Notes (Signed)
   Subjective:    Patient ID: Emily [name], female    DOB: 1951/04/11, 66 y.o.   MRN: [id number]  HPI Patient arrives to discuss cardiology referral. Patient states her brother and mother have had heart attacks and her brother wants her to go to cardiologist for work up. Patient is having no problems or symptoms. Patient denies any chest tightness pressure pain shortness breath has history hypertension and hyperlipidemia. Also mild overweight in low activity because of orthopedic issues  The patient does have sleep apnea. Is using machine from [place] 10 years. It is set to a titration of 7 cm. It does not help her much in way of hoping her with her snoring or her daytime energy. She wonders if she needs another sleep study. Patient would benefit from treatment.  Patient would like to see cardiology for further evaluation Review of Systems Denies chest tightness pressure pain nausea, diarrhea fever chills sweats    Objective:   Physical Exam Lungs clear no crackles heart regular pulse normal extremities no edema skin warm dry  Previous EKG looks good     Assessment & Plan:  HTN decent control continue current measures  Extensive history of coronary artery disease in the family I would recommend cardiology consult per patient request  History sleep apnea last sleep study more than 10 years ago she doesn't feel that she is getting the proper response. We will look into whether or not she needs have a complete new study or can just get a new machine that is self titrating

## 2017-07-19 ENCOUNTER — Encounter: Payer: Self-pay | Admitting: Family Medicine

## 2017-07-29 DIAGNOSIS — E781 Pure hyperglyceridemia: Secondary | ICD-10-CM | POA: Diagnosis not present

## 2017-07-30 LAB — HEPATIC FUNCTION PANEL
ALBUMIN: 4.6 g/dL (ref 3.6–4.8)
ALT: 23 IU/L (ref 0–32)
AST: 23 IU/L (ref 0–40)
Alkaline Phosphatase: 85 IU/L (ref 39–117)
BILIRUBIN TOTAL: 0.5 mg/dL (ref 0.0–1.2)
BILIRUBIN, DIRECT: 0.13 mg/dL (ref 0.00–0.40)
Total Protein: 7.1 g/dL (ref 6.0–8.5)

## 2017-07-30 LAB — LIPID PANEL
CHOL/HDL RATIO: 3.7 ratio (ref 0.0–4.4)
CHOLESTEROL TOTAL: 155 mg/dL (ref 100–199)
HDL: 42 mg/dL (ref >39)
LDL CALC: 57 mg/dL (ref 0–99)
Triglycerides: 279 mg/dL — ABNORMAL HIGH (ref 0–149)
VLDL CHOLESTEROL CAL: 56 mg/dL — AB (ref 5–40)

## 2017-08-01 DIAGNOSIS — S46012D Strain of muscle(s) and tendon(s) of the rotator cuff of left shoulder, subsequent encounter: Secondary | ICD-10-CM | POA: Diagnosis not present

## 2017-08-03 ENCOUNTER — Ambulatory Visit (INDEPENDENT_AMBULATORY_CARE_PROVIDER_SITE_OTHER): Payer: PPO | Admitting: Cardiovascular Disease

## 2017-08-03 ENCOUNTER — Encounter: Payer: Self-pay | Admitting: Cardiovascular Disease

## 2017-08-03 VITALS — BP 119/72 | HR 76 | Ht 62.0 in | Wt 178.0 lb

## 2017-08-03 DIAGNOSIS — R011 Cardiac murmur, unspecified: Secondary | ICD-10-CM | POA: Diagnosis not present

## 2017-08-03 DIAGNOSIS — Z8249 Family history of ischemic heart disease and other diseases of the circulatory system: Secondary | ICD-10-CM | POA: Diagnosis not present

## 2017-08-03 DIAGNOSIS — I10 Essential (primary) hypertension: Secondary | ICD-10-CM

## 2017-08-03 DIAGNOSIS — E785 Hyperlipidemia, unspecified: Secondary | ICD-10-CM

## 2017-08-03 NOTE — Patient Instructions (Signed)
Your physician recommends that you schedule a follow-up appointment in: AS NEEDED WITH DR KONESWARAN  Your physician recommends that you continue on your current medications as directed. Please refer to the Current Medication list given to you today.  Thank you for choosing [facility]!!    

## 2017-08-03 NOTE — Progress Notes (Signed)
CARDIOLOGY CONSULT NOTE  Patient ID: Emily [name] MRN: [id number] DOB/AGE: 12-28-1950 66 y.o.  Admit date: (Not on file) Primary Physician: Kathyrn Drown, MD Referring Physician: Wolfgang Phoenix  Reason for Consultation: family h/o CAD  HPI: Emily [name] is a 66 y.o. female who is being seen today for the evaluation of a family h/o heart disease at the request of Luking, Elayne Snare, MD.   She saw her PCP on 07/14/17. She told him that her brother and mother had heart attacks and her brother wanted her to be evaluated by a cardiologist. She has hypertension and hyperlipidemia. She denies exertional chest pain and shortness of breath. She has sleep apnea and uses CPAP.  Lipid panel 07/29/17: Total cholesterol 155, triglycerides mildly elevated at 279, HDL 42, LDL 57.  ECG performed in the office today which I ordered and personally interpreted demonstrates normal sinus rhythm with no ischemic ST segment or T-wave abnormalities, nor any arrhythmias.  She denies activity limiting chest pain and shortness of breath. She may have occasional shortness of breath only after walking 200 yards.  She has noticed her right hand occasionally have a bluish discoloration. She has a history of fibromyalgia.  She denies palpitations. She takes HCTZ for hypertension and ankle swelling.  She does not smoke.  She seldom gets lightheaded.  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   Family history: Mother had an MI at age 57. She was a smoker. Brother had an MI at age 61. He was a smoker as well.    Allergies  Allergen Reactions  . Codeine Itching  . Oxycodone-Acetaminophen Itching  . Sulfamethoxazole-Trimethoprim Hives  . Celebrex [Celecoxib] Other (See Comments)    Homicidal thoughts.  . Hydrocodone-Acetaminophen Itching    Takes with benadryl  . Losartan     Skin on arms got loose  . Lyrica [Pregabalin]     When combined with narcotics will cause SpO2 to drop  . Tramadol Itching     Current Outpatient Prescriptions  Medication Sig Dispense Refill  . acetaminophen (TYLENOL) 500 MG tablet Take 1,000 mg by mouth 3 (three) times daily as needed for moderate pain.     . diazepam (VALIUM) 5 MG tablet Take 5 mg by mouth every 6 (six) hours as needed for anxiety.    . DULoxetine (CYMBALTA) 60 MG capsule Take 1 capsule (60 mg total) by mouth daily. 30 capsule 5  . Fexofenadine HCl (ALLEGRA PO) Take 1 tablet by mouth daily as needed (allergies).     . hydrochlorothiazide (HYDRODIURIL) 25 MG tablet TAKE ONE TABLET BY MOUTH ONCE DAILY 30 tablet 5  . KLOR-CON M20 20 MEQ tablet TAKE ONE TABLET BY MOUTH ONCE DAILY 30 tablet 5  . pantoprazole (PROTONIX) 40 MG tablet Take 1 tablet (40 mg total) by mouth daily. 30 tablet 8  . pravastatin (PRAVACHOL) 20 MG tablet Take 1 tablet (20 mg total) by mouth daily. 30 tablet 5  . verapamil (CALAN-SR) 180 MG CR tablet Take 1 tablet (180 mg total) by mouth daily. 30 tablet 12   No current facility-administered medications for this visit.     Past Medical History:  Diagnosis Date  . Acid reflux   . Anxiety   . CHF (congestive heart failure) (HCC)    90's swelling body-no breathing problems at that time-no cardiac -in dr s Wolfgang Phoenix office [place] give lasix and potassium  . Complication of anesthesia    History of being told  she was allergic to something in 1980 no problems since  . Depression   . Fibromyalgia   . History of blood transfusion reaction 1978  . History of hiatal hernia   . HNP (herniated nucleus pulposus)   . Hypertension    Does not see a cardiologist no stress or echo  . Joint pain   . Nerve pain    right leg   . Ruptured lumbar disc   . Sleep apnea    Uses CPAP every night setting at 7 mm/Hg    Past Surgical History:  Procedure Laterality Date  . BACK SURGERY     x2  . BLADDER REPAIR     tear during during birth of child  . CARPAL TUNNEL RELEASE Bilateral   . CESAREAN SECTION    . CHOLECYSTECTOMY      gallbladder disease; stayed sick  . COLONOSCOPY N/A 04/28/2016   Procedure: COLONOSCOPY;  Surgeon: [name], MD;  Location: [facility];  Service: Endoscopy;  Laterality: N/A;  . ESOPHAGOGASTRODUODENOSCOPY N/A 04/28/2016   Procedure: ESOPHAGOGASTRODUODENOSCOPY (EGD);  Surgeon: [name], MD;  Location: [facility];  Service: Endoscopy;  Laterality: N/A;  12:00  . GALLBLADDER SURGERY    . knee right (torn Cart)    . REPLACEMENT TOTAL KNEE     left  . ROTATOR CUFF REPAIR     right  . SPINAL FUSION    . TUBAL LIGATION    . vein right leg removed    . wrist ganglion cyst left      Social History   Social History  . Marital status: Married    Spouse name: N/A  . Number of children: N/A  . Years of education: college    Occupational History  . nurse     Social History Main Topics  . Smoking status: Never Smoker  . Smokeless tobacco: Never Used  . Alcohol use No  . Drug use: No  . Sexual activity: No   Other Topics Concern  . Not on file   Social History Narrative  . No narrative on file       Current Meds  Medication Sig  . acetaminophen (TYLENOL) 500 MG tablet Take 1,000 mg by mouth 3 (three) times daily as needed for moderate pain.   . diazepam (VALIUM) 5 MG tablet Take 5 mg by mouth every 6 (six) hours as needed for anxiety.  . DULoxetine (CYMBALTA) 60 MG capsule Take 1 capsule (60 mg total) by mouth daily.  Marland Kitchen Fexofenadine HCl (ALLEGRA PO) Take 1 tablet by mouth daily as needed (allergies).   . hydrochlorothiazide (HYDRODIURIL) 25 MG tablet TAKE ONE TABLET BY MOUTH ONCE DAILY  . KLOR-CON M20 20 MEQ tablet TAKE ONE TABLET BY MOUTH ONCE DAILY  . pantoprazole (PROTONIX) 40 MG tablet Take 1 tablet (40 mg total) by mouth daily.  . pravastatin (PRAVACHOL) 20 MG tablet Take 1 tablet (20 mg total) by mouth daily.  . verapamil (CALAN-SR) 180 MG CR tablet Take 1 tablet (180 mg total) by mouth daily.      Review of systems complete and found to be negative  unless listed above in HPI    Physical exam Blood pressure 119/72, pulse 76, height 5\' 2"  (1.575 m), weight 178 lb (80.7 kg). General: NAD Neck: No JVD, no thyromegaly or thyroid nodule.  Lungs: Clear to auscultation bilaterally with normal respiratory effort. CV: Nondisplaced PMI. Regular rate and rhythm, normal S1/S2, no S3/S4, very soft systolic murmur over  RUSB.  No peripheral edema.  No carotid bruit.    Abdomen: Soft, nontender, no distention.  Skin: Intact without lesions or rashes.  Neurologic: Alert and oriented x 3.  Psych: Normal affect. Extremities: No clubbing or cyanosis.  HEENT: Normal.   ECG: Most recent ECG reviewed.   Labs: Lab Results  Component Value Date/Time   K 4.2 06/20/2017 10:41 AM   BUN 12 06/20/2017 10:41 AM   CREATININE 0.72 06/20/2017 10:41 AM   CREATININE 0.79 08/20/2013 07:41 AM   ALT 23 07/29/2017 08:54 AM   TSH 1.670 10/01/2016 08:23 AM   HGB 14.7 02/17/2017 10:32 AM   HGB 13.2 06/01/2016 02:30 PM   HGB 14.1 01/30/2016 11:48 AM     Lipids: Lab Results  Component Value Date/Time   LDLCALC 57 07/29/2017 08:54 AM   CHOL 155 07/29/2017 08:54 AM   TRIG 279 (H) 07/29/2017 08:54 AM   HDL 42 07/29/2017 08:54 AM        ASSESSMENT AND PLAN:  1. Family history of coronary artery disease: It appears both her mother and brother had risk factors which this patient does not possess in the form of tobacco abuse. Her risk factors for coronary artery disease are hypertension and hyperlipidemia which are both reasonably well controlled. Given the absence of symptoms, no cardiac testing is indicated at this time.  2. Hypertension: Controlled. No changes.  3. Hyperlipidemia: Continue statin.  4. Cardiac murmur: This appears to be a benign murmur over the aortic area and likely represents aortic valve sclerosis. If it were to get louder, I would then consider echocardiography.    Disposition: Follow up prn.  Signed: Kate Sable, M.D.,  F.A.C.C.  08/03/2017, 1:35 PM

## 2017-09-07 ENCOUNTER — Telehealth: Payer: Self-pay | Admitting: Family Medicine

## 2017-09-07 NOTE — Telephone Encounter (Signed)
Spoke with patient, she states that she would like to discuss this at her 6 month follow up on 09/27/17, added this to appointment notes on appt desk

## 2017-09-07 NOTE — Telephone Encounter (Signed)
See note below from Dr. Nicki Reaper [facility] [place], pt will need OV to discuss sleep apnea & need for new machine, pt will also need a new sleep study prior to ordering a new machine  [facility] for pt - ? Add this to current follow up OV 09/27/17 or does she want a separate appointment    Luking, Elayne Snare, MD  Lisa Roca E  Patient does not feel her sleep apnea machine works as well as it use to her last sleep study's approximately 10 years ago according to the patient-does the patient need a new sleep study or can she get a new machine that his auto titrating

## 2017-09-27 ENCOUNTER — Ambulatory Visit (INDEPENDENT_AMBULATORY_CARE_PROVIDER_SITE_OTHER): Payer: PPO | Admitting: Family Medicine

## 2017-09-27 ENCOUNTER — Encounter: Payer: Self-pay | Admitting: Family Medicine

## 2017-09-27 VITALS — BP 124/86 | Ht 62.0 in | Wt 176.0 lb

## 2017-09-27 DIAGNOSIS — R7303 Prediabetes: Secondary | ICD-10-CM

## 2017-09-27 DIAGNOSIS — K219 Gastro-esophageal reflux disease without esophagitis: Secondary | ICD-10-CM

## 2017-09-27 DIAGNOSIS — G4733 Obstructive sleep apnea (adult) (pediatric): Secondary | ICD-10-CM

## 2017-09-27 DIAGNOSIS — I10 Essential (primary) hypertension: Secondary | ICD-10-CM | POA: Diagnosis not present

## 2017-09-27 DIAGNOSIS — E781 Pure hyperglyceridemia: Secondary | ICD-10-CM | POA: Diagnosis not present

## 2017-09-27 LAB — POCT GLYCOSYLATED HEMOGLOBIN (HGB A1C): HEMOGLOBIN A1C: 5.1

## 2017-09-27 MED ORDER — PANTOPRAZOLE SODIUM 40 MG PO TBEC: 40.0000 mg | DELAYED_RELEASE_TABLET | Freq: Every day | ORAL | 8 refills | Status: DC

## 2017-09-27 MED ORDER — PRAVASTATIN SODIUM 20 MG PO TABS: 20.0000 mg | ORAL_TABLET | Freq: Every day | ORAL | 5 refills | Status: DC

## 2017-09-27 MED ORDER — VERAPAMIL HCL ER 180 MG PO TBCR: 180.0000 mg | EXTENDED_RELEASE_TABLET | Freq: Every day | ORAL | 5 refills | Status: DC

## 2017-09-27 MED ORDER — POTASSIUM CHLORIDE CRYS ER 20 MEQ PO TBCR: 20.0000 meq | EXTENDED_RELEASE_TABLET | Freq: Every day | ORAL | 5 refills | Status: DC

## 2017-09-27 MED ORDER — HYDROCHLOROTHIAZIDE 25 MG PO TABS: 25.0000 mg | ORAL_TABLET | Freq: Every day | ORAL | 5 refills | Status: DC

## 2017-09-27 MED ORDER — DULOXETINE HCL 60 MG PO CPEP: 60.0000 mg | ORAL_CAPSULE | Freq: Every day | ORAL | 5 refills | Status: DC

## 2017-09-27 NOTE — Progress Notes (Signed)
   Subjective:    Patient ID: Emily [name], female    DOB: Jun 03, 1951, 66 y.o.   MRN: [id number]  Hypertension  This is a chronic problem. The current episode started more than 1 year ago. Pertinent negatives include no chest pain, headaches or shortness of breath.   Patient would like to discuss CPAP. [facility] 65 years old  Results for orders placed or performed in visit on 09/27/17  POCT HgB A1C  Result Value Ref Range   Hemoglobin A1C 5.1    Right knee pain- dr Aline Brochure  CPAP- [place] On 7 cm h20  Review of Systems  Constitutional: Negative for activity change, fatigue and fever.  HENT: Negative for congestion.   Respiratory: Negative for cough, chest tightness and shortness of breath.   Cardiovascular: Negative for chest pain and leg swelling.  Gastrointestinal: Negative for abdominal pain.  Skin: Negative for color change.  Neurological: Negative for headaches.  Psychiatric/Behavioral: Negative for behavioral problems.       Objective:   Physical Exam  Constitutional: She appears well-developed and well-nourished. No distress.  HENT:  Head: Normocephalic and atraumatic.  Eyes: Right eye exhibits no discharge. Left eye exhibits no discharge.  Neck: No tracheal deviation present.  Cardiovascular: Normal rate, regular rhythm and normal heart sounds.   No murmur heard. Pulmonary/Chest: Effort normal and breath sounds normal. No respiratory distress. She has no wheezes. She has no rales.  Musculoskeletal: She exhibits no edema.  Lymphadenopathy:    She has no cervical adenopathy.  Neurological: She is alert. She exhibits normal muscle tone.  Skin: Skin is warm and dry. No erythema.  Psychiatric: Her behavior is normal.  Vitals reviewed.     25 minutes was spent with the patient. Greater than half the time was spent in discussion and answering questions and counseling regarding the issues that the patient came in for today.       Assessment & Plan:  Prediabetes good control watch starches try to lose weight  Obesity minimize calorie intake stay physically active try to bring weight down  Osteoarthritis right knee she states she may not have him have knee replacement in the coming months  HTN good control continue current measures  Sleep apnea she states it is not doing a good job controlling her sleep apnea symptoms she may well need to have a new study in order to figure out what is the right pressure for her  Hyperlipidemia continue medication watch diet  Reflux under decent control with medication  Patient denies being depressed  Lab work from August reviewed no additional lab work necessary

## 2017-09-30 ENCOUNTER — Ambulatory Visit (INDEPENDENT_AMBULATORY_CARE_PROVIDER_SITE_OTHER): Payer: PPO | Admitting: Orthopedic Surgery

## 2017-09-30 ENCOUNTER — Encounter: Payer: Self-pay | Admitting: Orthopedic Surgery

## 2017-09-30 VITALS — BP 142/84 | HR 70 | Ht 62.0 in | Wt 175.0 lb

## 2017-09-30 DIAGNOSIS — G8929 Other chronic pain: Secondary | ICD-10-CM | POA: Diagnosis not present

## 2017-09-30 DIAGNOSIS — M25561 Pain in right knee: Secondary | ICD-10-CM

## 2017-09-30 DIAGNOSIS — M1711 Unilateral primary osteoarthritis, right knee: Secondary | ICD-10-CM

## 2017-09-30 MED ORDER — METHYLPREDNISOLONE ACETATE 40 MG/ML IJ SUSP
40.0000 mg | Freq: Once | INTRAMUSCULAR | Status: AC
  Administered 2017-09-30: 40 mg via INTRA_ARTICULAR

## 2017-09-30 NOTE — Patient Instructions (Signed)

## 2017-09-30 NOTE — Progress Notes (Signed)
Chief Complaint  Patient presents with  . Knee Pain    right     Procedure note right knee injection verbal consent was obtained to inject right knee joint  Timeout was completed to confirm the site of injection  The medications used were 40 mg of Depo-Medrol and 1% lidocaine 3 cc  Anesthesia was provided by ethyl chloride and the skin was prepped with alcohol.  After cleaning the skin with alcohol a 20-gauge needle was used to inject the right knee joint. There were no complications. A sterile bandage was applied.

## 2017-10-18 DIAGNOSIS — L821 Other seborrheic keratosis: Secondary | ICD-10-CM | POA: Diagnosis not present

## 2017-10-18 DIAGNOSIS — D2239 Melanocytic nevi of other parts of face: Secondary | ICD-10-CM | POA: Diagnosis not present

## 2017-11-02 ENCOUNTER — Ambulatory Visit (INDEPENDENT_AMBULATORY_CARE_PROVIDER_SITE_OTHER): Payer: PPO | Admitting: *Deleted

## 2017-11-02 DIAGNOSIS — Z23 Encounter for immunization: Secondary | ICD-10-CM

## 2017-11-07 ENCOUNTER — Ambulatory Visit: Payer: PPO | Admitting: Orthopedic Surgery

## 2017-11-07 VITALS — BP 154/101 | HR 70 | Ht 62.0 in | Wt 172.0 lb

## 2017-11-07 DIAGNOSIS — Z96652 Presence of left artificial knee joint: Secondary | ICD-10-CM | POA: Diagnosis not present

## 2017-11-07 DIAGNOSIS — Z9889 Other specified postprocedural states: Secondary | ICD-10-CM | POA: Diagnosis not present

## 2017-11-07 DIAGNOSIS — S83001A Unspecified subluxation of right patella, initial encounter: Secondary | ICD-10-CM | POA: Diagnosis not present

## 2017-11-07 DIAGNOSIS — M1711 Unilateral primary osteoarthritis, right knee: Secondary | ICD-10-CM | POA: Diagnosis not present

## 2017-11-07 NOTE — Patient Instructions (Signed)
  Physical therapy has been ordered for you at [facility] [phone] is the phone number to call if you want to call to schedule. Please let us know if you do not hear anything within one week.  

## 2017-11-07 NOTE — Progress Notes (Signed)
Progress Note   Patient ID: Emily [name], female   DOB: 20-Oct-1951, 66 y.o.   MRN: [id number]  Chief Complaint  Patient presents with  . Follow-up    1 month recheck on right knee.    Arthritis of knee, right History of total left knee replacement History of lumbar surgery Patellar subluxation, right, initial encounter  (primary encounter diagnosis)  66 year old female had injection of her right knee for osteoarthritis was doing well until she got out of the car twisted her knee felt acute pain and inability to walk painful weightbearing and medial parapatellar pain difficulty going up steps.  Presents for evaluation for that     Review of Systems  Constitutional: Negative for fever.  Musculoskeletal: Positive for joint pain.  Skin: Negative.    No outpatient medications have been marked as taking for the 11/07/17 encounter (Office Visit) with [name], MD.    Allergies  Allergen Reactions  . Codeine Itching  . Oxycodone-Acetaminophen Itching  . Sulfamethoxazole-Trimethoprim Hives  . Celebrex [Celecoxib] Other (See Comments)    Homicidal thoughts.  . Hydrocodone-Acetaminophen Itching    Takes with benadryl  . Losartan     Skin on arms got loose  . Lyrica [Pregabalin]     When combined with narcotics will cause SpO2 to drop  . Tramadol Itching     BP (!) 154/101   Pulse 70   Ht 5\' 2"  (1.575 m)   Wt 172 lb (78 kg)   BMI 31.46 kg/m   Physical Exam  Constitutional: She is oriented to person, place, and time. She appears well-developed and well-nourished.  Musculoskeletal:       Right knee: She exhibits swelling, effusion, abnormal patellar mobility and bony tenderness. She exhibits no ecchymosis, no deformity, no laceration, no erythema, normal alignment, no LCL laxity, normal meniscus and no MCL laxity. Tenderness found. No medial joint line, no lateral joint line, no MCL, no LCL and no patellar tendon tenderness noted.       Legs: Neurological: She is  alert and oriented to person, place, and time.  Psychiatric: She has a normal mood and affect.     Medical decision-making Encounter Diagnoses  Name Primary?  Marland Kitchen Arthritis of knee, right   . History of total left knee replacement   . History of lumbar surgery   . Patellar subluxation, right, initial encounter Yes    Arthritis right knee stable  Left total knee stable  L-spine stable  Patellar subluxation recommend physical therapy and a follow-up in 5 weeks     Arther Abbott, MD 11/07/2017 10:47 AM

## 2017-11-11 ENCOUNTER — Telehealth: Payer: Self-pay | Admitting: Family Medicine

## 2017-11-11 MED ORDER — ALPRAZOLAM 0.5 MG PO TABS: ORAL_TABLET | ORAL | 0 refills | Status: DC

## 2017-11-11 NOTE — Telephone Encounter (Signed)
I called and left a vm,asked that she r/c. Rx printed awaiting signature. Find out where to send the fax from the pt.

## 2017-11-11 NOTE — Telephone Encounter (Signed)
Patient is aware 

## 2017-11-11 NOTE — Telephone Encounter (Signed)
Xanax 0.5 mg, #30, 1/2-1 twice daily as needed anxiety, caution drowsiness-please follow-up if ongoing troubles.  Also nurses please let the patient know that earlier this year she received a prescription of Valium she should not take Valium with Xanax because it could make her too drowsy

## 2017-11-11 NOTE — Telephone Encounter (Signed)
Please advise 

## 2017-11-11 NOTE — Telephone Encounter (Signed)
Pt called to see if the Dr will prescribe her xanax. Pt's brother died and she is having a hard time dealing with the loss. Please advise.    [facility] [place]

## 2017-11-14 ENCOUNTER — Ambulatory Visit (HOSPITAL_COMMUNITY): Payer: PPO | Attending: Orthopedic Surgery

## 2017-11-14 DIAGNOSIS — M25661 Stiffness of right knee, not elsewhere classified: Secondary | ICD-10-CM | POA: Insufficient documentation

## 2017-11-14 DIAGNOSIS — R29898 Other symptoms and signs involving the musculoskeletal system: Secondary | ICD-10-CM | POA: Insufficient documentation

## 2017-11-14 DIAGNOSIS — Z7409 Other reduced mobility: Secondary | ICD-10-CM | POA: Insufficient documentation

## 2017-11-15 ENCOUNTER — Other Ambulatory Visit: Payer: Self-pay

## 2017-11-15 ENCOUNTER — Ambulatory Visit: Payer: PPO | Admitting: Nutrition

## 2017-11-15 ENCOUNTER — Encounter (HOSPITAL_COMMUNITY): Payer: Self-pay

## 2017-11-15 ENCOUNTER — Ambulatory Visit (HOSPITAL_COMMUNITY): Payer: PPO

## 2017-11-15 DIAGNOSIS — Z7409 Other reduced mobility: Secondary | ICD-10-CM | POA: Diagnosis not present

## 2017-11-15 DIAGNOSIS — M25661 Stiffness of right knee, not elsewhere classified: Secondary | ICD-10-CM

## 2017-11-15 DIAGNOSIS — R29898 Other symptoms and signs involving the musculoskeletal system: Secondary | ICD-10-CM

## 2017-11-15 NOTE — Therapy (Signed)
[place], [place], [address] Phone: [phone]   Fax:  [phone]  Physical Therapy Evaluation  Patient Details  Name: ENRIQUE WEISS MRN: [id number] Date of Birth: 11/07/1956 Referring Provider: [name], MD   Encounter Date: 11/15/2017  PT End of Session - 11/15/17 1637    Visit Number  1    Number of Visits  17    Date for PT Re-Evaluation  12/13/17    Authorization Type  [employer]    Authorization Time Period  11/15/2017- 01/16/2018     Authorization - Visit Number  1    Authorization - Number of Visits  10    PT Start Time  4235    PT Stop Time  1559    PT Time Calculation (min)  40 min    Activity Tolerance  Patient tolerated treatment well;Patient limited by pain;No increased pain    Behavior During Therapy  WFL for tasks assessed/performed       Past Medical History:  Diagnosis Date  . Acid reflux   . Anxiety   . CHF (congestive heart failure) (HCC)    90's swelling body-no breathing problems at that time-no cardiac -in dr s Wolfgang [place] office [place] give lasix and potassium  . Complication of anesthesia    History of being told she was allergic to something in 1980 no problems since  . Depression   . Fibromyalgia   . History of blood transfusion reaction 1978  . History of hiatal hernia   . HNP (herniated nucleus pulposus)   . Hypertension    Does not see a cardiologist no stress or echo  . Joint pain   . Nerve pain    right leg   . Ruptured lumbar disc   . Sleep apnea    Uses CPAP every night setting at 7 mm/Hg    Past Surgical History:  Procedure Laterality Date  . BACK SURGERY     x2  . BLADDER REPAIR     tear during during birth of child  . CARPAL TUNNEL RELEASE Bilateral   . CESAREAN SECTION    . CHOLECYSTECTOMY     gallbladder disease; stayed sick  . COLONOSCOPY N/A 04/28/2016   Procedure: COLONOSCOPY;  Surgeon: [name], MD;  Location: [facility];  Service: Endoscopy;  Laterality: N/A;  . ESOPHAGOGASTRODUODENOSCOPY N/A 04/28/2016   Procedure: ESOPHAGOGASTRODUODENOSCOPY (EGD);  Surgeon: [name], MD;  Location: [facility];  Service: Endoscopy;  Laterality: N/A;  12:00  . GALLBLADDER SURGERY    . knee right (torn Cart)    . REPLACEMENT TOTAL KNEE     left  . ROTATOR CUFF REPAIR     right  . SPINAL FUSION    . TUBAL LIGATION    . vein right leg removed    . wrist ganglion cyst left      There were no vitals filed for this visit.   Subjective Assessment - 11/15/17 1520    Subjective  The monday before Thanksgiving 10/24/17 Right lower extremity patient felt that her kneecap went medially and then laterally after trying to stand up from inside car. Patient reports pain all around the knee front and back. After issue patient could not walk for 3 days and was on crutches.  Patient reports that she has increased pain with standing up. Patient reports a left knee replacement about 10 years ago.  Patient states the pain sometimes radiates up  and down. Patient reports having cortisone shots 2 weeks before this current complaint which helped arthritis pain.  Patient states she has pain at night, but that she can usually move to relieve it. Patient denies sudden weight loss, fever, or chills. Patient reports a deep pain below knee that she states she previously discussed with physician. Patient states she has difficulty with stairs and goes up with the left and down with the right lower extremity.     Pertinent History  Osteoarthritis moderate in Right knee; TKA in Left knee    Limitations  Sitting;Standing;Walking;House hold activities;Lifting    How long can you sit comfortably?  1 minute    How long can you stand comfortably?  1 minute    How long can you walk comfortably?  1 minute    Diagnostic tests  x-rays: demonstrate right knee osteoarthritis    Patient Stated Goals  being able to sit and walk without it being  painful; to be able to squat or bend over; bring leg up to tie shoes    Currently in Pain?  Yes    Pain Score  5     Pain Location  Knee    Pain Orientation  Right    Pain Descriptors / Indicators  Aching    Pain Type  Chronic pain    Pain Radiating Towards  Above and below the knee    Pain Onset  1 to 4 weeks ago    Pain Frequency  Other (Comment) Constant ache with some occassional shooting pain    Aggravating Factors   standing, walking, straightening it out, lifting something, going up and down steps    Pain Relieving Factors  Walking helps it a little for 10-15 steps    Effect of Pain on Daily Activities  Pain is limiting ability to stand or walk for greater than 1 minute    Multiple Pain Sites  No         [facility] PT Assessment - 11/15/17 0001      Assessment   Medical Diagnosis  Patella subluxation, knee OA     Referring Provider  [name], MD    Onset Date/Surgical Date  11/15/17    Prior Therapy  After Left total knee replacement      Balance Screen   Has the patient fallen in the past 6 months  No    Has the patient had a decrease in activity level because of a fear of falling?   No    Is the patient reluctant to leave their home because of a fear of falling?   Yes      Observation/Other Assessments   Observations  Unable to assess fully due to clothing restrictions      Circumferential Edema   Circumferential - Right  48.5 8 cm above knee; 43 cm 8 cm below knee    Circumferential - Left   47 cm 8 cm above knee; 42 cm 8 cm below knee      Sensation   Light Touch  Appears Intact Patient reports numbness on upper left thigh from TKA      AROM   Right Knee Extension  15 15 degrees from 0    Right Knee Flexion  98 With pain    Left Knee Extension  1 1 degree from 0    Left Knee Flexion  115      Strength   Right/Left Hip  Right;Left    Right Hip Flexion  3/5    Right Hip ABduction  4+/5    Right Hip ADduction  4+/5    Left Hip Flexion  5/5    Left  Hip ABduction  5/5    Left Hip ADduction  5/5    Right/Left Knee  Right;Left    Right Knee Flexion  3-/5    Right Knee Extension  3-/5    Left Knee Flexion  5/5    Left Knee Extension  5/5    Right/Left Ankle  Right;Left    Right Ankle Dorsiflexion  5/5 Some pain    Right Ankle Plantar Flexion  5/5 Some pain    Left Ankle Dorsiflexion  5/5    Left Ankle Plantar Flexion  5/5      Flexibility   Hamstrings  Both moderately limited; Right more limited due to pain      Palpation   Palpation comment  Patient reports pain over patella; at joint line; posterior to patella; and superiorly and inferior to patella      Special Tests    Special Tests  Knee Special Tests    Knee Special tests   other      other    Findings  Negative    Side   Right;Left    Comments  -- Pain on right varus and valgus tests but no laxity noted      Transfers   Five time sit to stand comments   -- Transfer is slow and appears antalgic with weight shift      Ambulation/Gait   Gait Comments  Gait appears antalgic with decreased stance time on the right lower extremity and decreased stride on Left lower extremity      Static Standing Balance   Static Standing - Balance Support  No upper extremity supported    Static Standing - Level of Assistance  7: Independent    Static Standing Balance -  Activities   Single Leg Stance - Right Leg;Single Leg Stance - Left Leg;Tandam Stance - Right Leg;Tandam Stance - Left Leg    Static Standing - Comment/# of Minutes  Patient maintains NBOS 15 seconds; Left SLS 5 seconds, Right SLS 2 secs; Tandem 3 seconds on Right 5 seconds on left      Timed Up and Go Test   TUG  Normal TUG    Normal TUG (seconds)  23    TUG Comments  The norm for patient's age is 8.1 seconds P      Functional Gait  Assessment   Gait assessed   --             Objective measurements completed on examination: See above findings.      [facility] Adult PT Treatment/Exercise - 11/15/17 0001       Knee/Hip Exercises: Supine   Heel Slides  2 sets;10 reps    Heel Slides Limitations  -- Range of motion on the right side      Ankle Exercises: Supine   Other Supine Ankle Exercises  Ankle pumps 2x10 to reduce swelling             PT Education - 11/15/17 1636    Education provided  Yes    Education Details  Patient was educated on examination findings, plan of care, and on home exercise program    Person(s) Educated  Patient    Methods  Explanation;Handout    Comprehension  Verbalized understanding       PT Short Term Goals - 11/15/17  Sweetser #1   Title  Patient will report understandign and regular compliance with HEP.     Baseline  Patient educated about HEP.     Time  4    Period  Weeks    Status  New    Target Date  12/13/17      PT SHORT TERM GOAL #2   Title  Patient will demonstrate improve right knee strength of 1/2 MMT grade in all limited planes in order to improve functional mobility.     Baseline  Limited by 3-/5 in knee flexion/extension.    Time  4    Period  Weeks    Status  New    Target Date  12/13/17      PT SHORT TERM GOAL #3   Title  Patient will demonstrate ability to perform single limb stance on right lower extremity for 5 seconds in order to assist with stair ambulation.     Baseline  Patient maintians for 2 seconds on right LE.     Time  4    Period  Weeks    Status  New    Target Date  12/13/17        PT Long Term Goals - 11/15/17 1657      PT LONG TERM GOAL #1   Title  Patient will demonstrate improved knee strength of 1 MMT in order to assist with functional mobility.     Baseline  Knee flexion extension on right is 3-/5    Time  8    Period  Weeks    Status  New    Target Date  01/10/18      PT LONG TERM GOAL #2   Title  Patient will demonstrate ability to perform TUG in 13 seconds indicating an improvement dynamic balance.      Baseline  Patient performs TUG in 23 seconds.     Time  8    Period  Weeks     Status  New    Target Date  01/10/18      PT LONG TERM GOAL #3   Title  Patient will demonstrate ability to perform single limb stance for 10 seconds in order to assist with static balance and navigating the environment.     Baseline  Patient maintains right SLS for 2 seconds.     Time  8    Period  Weeks    Status  New    Target Date  01/10/18             Plan - 11/15/17 1640    Clinical Impression Statement  Patient is a pleasant 66 year old female that reports to physical therapy with a history of right patellar subluxation after getting out of a vehicle 10/24/17. Upon examination patient demonstrates decreased range of motion in right knee with 98 degrees of flexion actively and -15 degrees of knee flexion. The patient is limited in knee and hip strength on the right side demonstrating 3-/5 knee flexion and extension on the right and decreased hip flexion. Patient is tender to palpation on patella, superior and inferior to patella and on the posterior side of knee. Ligament laxity testing is negative bilaterally. Patient demonstrates limited static standing balance and very limited dynamic walking balance as assessed by the TUG. Patient also has edema indicated by 1.5 cm greater on the right knee 8 cms above the knee and 1 cm greater on  the right knee 8 cms below the knee. Patient is reporting and demonstrating decreased ability to perform functional activities such as standing, walking or sitting for greater than 1 minute. Patient denies any fever, weight loss, or non-mechanical night pain. Patient reports deep "bone pain" but reports that she has already discussed this with her physician. Patient would benefit from physical therapy in order to address current deficits in balance, knee edema, knee and hip strength, and knee range of motion in order to decrease pain and improve functional independence.     History and Personal Factors relevant to plan of care:  Patient has had a history  of Left TKA; osteoarthritis is right knee    Clinical Presentation due to:  Positive factors: Patient motivation and patient attitude; Negative factors: length of time since injury    Clinical Decision Making  Low    Rehab Potential  Good    Clinical Impairments Affecting Rehab Potential  Pain, ROM, strength, balance, edema    PT Frequency  2x / week    PT Duration  8 weeks    PT Treatment/Interventions  ADLs/Self Care Home Management;Cryotherapy;Gait training;Stair training;Functional mobility training;Therapeutic activities;Therapeutic exercise;Balance training;Neuromuscular re-education;Patient/family education;Manual techniques;Passive range of motion    PT Next Visit Plan  FOTO; Ask about advanced directives; ROM exercises; quad strengthening    PT Home Exercise Plan  Ankle pumps 2x10; heel slides 2x10 both 2 times daily    Consulted and Agree with Plan of Care  Patient       Patient will benefit from skilled therapeutic intervention in order to improve the following deficits and impairments:  Abnormal gait, Decreased endurance, Hypomobility, Increased edema, Decreased activity tolerance, Decreased strength, Increased fascial restricitons, Pain, Decreased balance, Decreased mobility, Difficulty walking, Decreased range of motion, Improper body mechanics, Decreased coordination, Impaired flexibility, Postural dysfunction  Visit Diagnosis: Impaired functional mobility, balance, gait, and endurance  Decreased range of motion (ROM) of right knee  Decreased strength involving knee joint  G-Codes - Nov 23, 2017 1704    Functional Assessment Tool Used (Outpatient Only)  Clinical judgement, strength assessment, and TUG    Functional Limitation  Mobility: Walking and moving around    Mobility: Walking and Moving Around Current Status ([id number])  At least 40 percent but less than 60 percent impaired, limited or restricted    Mobility: Walking and Moving Around Goal Status [phone])  At least 20 percent  but less than 40 percent impaired, limited or restricted        Problem List Patient Active Problem List   Diagnosis Date Noted  . GERD without esophagitis 03/28/2017  . Obesity (BMI 30-39.9) 03/28/2017  . Obesity 10/06/2016  . Lumbar foraminal stenosis 06/04/2016  . Carpal tunnel syndrome, bilateral 02/07/2014  . S/P lumbar spinal fusion 02/07/2014  . Obstructive sleep apnea 02/04/2014  . Hand arthritis 10/15/2013  . HTN (hypertension), benign 08/24/2013  . Hypertriglyceridemia 08/24/2013  . Iliotibial band tendonitis 02/08/2013  . Trigger finger, acquired 02/08/2013  . Low back pain 06/22/2011  . HNP (herniated nucleus pulposus) 06/22/2011  . HIP PAIN, RIGHT 12/15/2010  . LUMBOSACRAL SPONDYLOSIS WITHOUT MYELOPATHY 12/15/2010  . SPINAL STENOSIS, LUMBAR 12/15/2010  . KNEE REPLACEMENT, HX OF 02/26/2010  . DERANGEMENT MENISCUS 07/03/2009  . KNEE, ARTHRITIS, DEGEN./OSTEO 05/22/2009  . KNEE PAIN 05/22/2009  . ANSERINE BURSITIS 05/22/2009  . TRIGGER FINGER 09/23/2008    Clarene Critchley PT, DPT 5:19 PM, 11-23-2017 [place], [place], [address]  Phone: [phone]   Fax:  [phone]  Name: MERIA CRILLY MRN: [id number] Date of Birth: 27-Jan-1951

## 2017-11-15 NOTE — Patient Instructions (Signed)
  ANKLE PUMPS - AP Bend your foot up and down at your ankle joint as shown.     10 times, 2 sets, twice daily   HEEL SLIDES - SUPINE Lying on your back with knees straight, slide the affected heel towards your buttock as you bend your knee. Hold a gentle stretch in this position and then return to original position.     10 times, 2 sets, twice daily

## 2017-11-17 ENCOUNTER — Encounter (HOSPITAL_COMMUNITY): Payer: Self-pay

## 2017-11-17 ENCOUNTER — Other Ambulatory Visit: Payer: Self-pay

## 2017-11-17 ENCOUNTER — Ambulatory Visit (HOSPITAL_COMMUNITY): Payer: PPO

## 2017-11-17 DIAGNOSIS — Z7409 Other reduced mobility: Secondary | ICD-10-CM

## 2017-11-17 DIAGNOSIS — M25661 Stiffness of right knee, not elsewhere classified: Secondary | ICD-10-CM

## 2017-11-17 DIAGNOSIS — R29898 Other symptoms and signs involving the musculoskeletal system: Secondary | ICD-10-CM

## 2017-11-17 NOTE — Therapy (Signed)
[place], [place], [address] Phone: [phone]   Fax:  [phone]  Physical Therapy Treatment  Patient Details  Name: Emily [name] MRN: [id number] Date of Birth: 1951-08-18 Referring Provider: [name], MD   Encounter Date: 11/17/2017  PT End of Session - 11/17/17 1319    Visit Number  2    Number of Visits  17    Date for PT Re-Evaluation  12/13/17    Authorization Type  [employer]    Authorization Time Period  11/15/2017- 01/16/2018     Authorization - Visit Number  2    Authorization - Number of Visits  10    PT Start Time  6384    PT Stop Time  1345    PT Time Calculation (min)  42 min    Activity Tolerance  Patient tolerated treatment well;Patient limited by pain;No increased pain    Behavior During Therapy  WFL for tasks assessed/performed       Past Medical History:  Diagnosis Date  . Acid reflux   . Anxiety   . CHF (congestive heart failure) (HCC)    90's swelling body-no breathing problems at that time-no cardiac -in dr s Wolfgang [place] office [place] give lasix and potassium  . Complication of anesthesia    History of being told she was allergic to something in 1980 no problems since  . Depression   . Fibromyalgia   . History of blood transfusion reaction 1978  . History of hiatal hernia   . HNP (herniated nucleus pulposus)   . Hypertension    Does not see a cardiologist no stress or echo  . Joint pain   . Nerve pain    right leg   . Ruptured lumbar disc   . Sleep apnea    Uses CPAP every night setting at 7 mm/Hg    Past Surgical History:  Procedure Laterality Date  . BACK SURGERY     x2  . BLADDER REPAIR     tear during during birth of child  . CARPAL TUNNEL RELEASE Bilateral   . CESAREAN SECTION    . CHOLECYSTECTOMY     gallbladder disease; stayed sick  . COLONOSCOPY N/A 04/28/2016   Procedure: COLONOSCOPY;  Surgeon: [name], MD;  [facility];  Service: Endoscopy;  Laterality: N/A;  . ESOPHAGOGASTRODUODENOSCOPY N/A 04/28/2016   Procedure: ESOPHAGOGASTRODUODENOSCOPY (EGD);  Surgeon: [name], MD;  [facility];  Service: Endoscopy;  Laterality: N/A;  12:00  . GALLBLADDER SURGERY    . knee right (torn Cart)    . REPLACEMENT TOTAL KNEE     left  . ROTATOR CUFF REPAIR     right  . SPINAL FUSION    . TUBAL LIGATION    . vein right leg removed    . wrist ganglion cyst left      There were no vitals filed for this visit.  Subjective Assessment - 11/17/17 1350    Subjective  Patient has been well the last 2 days since her evaluation. She has been able to participate in her HEP and is having some pain with the heel slides but it is tolerable.    Pertinent History  Osteoarthritis moderate in Right knee; TKA in Left knee    Limitations  Sitting;Standing;Walking;House hold activities;Lifting    How long can you sit comfortably?  --    How long can you stand comfortably?  --  How long can you walk comfortably?  --    Diagnostic tests  --    Patient Stated Goals  being able to sit and walk without it being painful; to be able to squat or bend over; bring leg up to tie shoes    Currently in Pain?  Yes    Pain Score  4     Pain Location  Knee    Pain Orientation  Right    Pain Descriptors / Indicators  Aching    Pain Type  Chronic pain    Pain Onset  1 to 4 weeks ago    Pain Frequency  Constant    Aggravating Factors   standing up, sitting down for a long time    Pain Relieving Factors  walking    Effect of Pain on Daily Activities  limits ability to do sittign or standing activity for extended period    Multiple Pain Sites  No       [facility] PT Assessment - 11/17/17 0001      AROM   Right Knee Extension  5    Right Knee Flexion  106        [facility] Adult PT Treatment/Exercise - 11/17/17 0001      Knee/Hip Exercises: Machines for Strengthening   Total Gym Leg Press  1x 20 reps for functional squat,  29* angle      Knee/Hip Exercises: Standing   Terminal Knee Extension  AROM;Strengthening;Right;1 set;20 reps;Theraband    Theraband Level (Terminal Knee Extension)  Level 4 (Blue)    Functional Squat  1 set;5 reps;Limitations    Functional Squat Limitations  wall squat (mini), stopped exercise secondary to pain in right knee      Knee/Hip Exercises: Supine   Short Arc Quad Sets  AROM;Strengthening;Right;2 sets;10 reps;Limitations    Short Arc Quad Sets Limitations  pain relief with tactile cue/pressure on supreior medial knee    Bridges  AROM;Strengthening;Both;2 sets;10 reps;Limitations    Bridges Limitations  red tehraband for abduction      Knee/Hip Exercises: Sidelying   Clams  2x 10 on right side with red theraband        PT Education - 11/17/17 1352    Education provided  Yes    Education Details  Reviewed evaluation and goals with patient. Educated on proper exercise form and updated HEP. Educated on functional measures, FOTO, and on pain management with ice and importance of HEP participation.    Person(s) Educated  Patient    Methods  Explanation;Handout    Comprehension  Verbalized understanding;Returned demonstration       PT Short Term Goals - 11/15/17 1654      PT SHORT TERM GOAL #1   Title  Patient will report understandign and regular compliance with HEP.     Baseline  Patient educated about HEP.     Time  4    Period  Weeks    Status  New    Target Date  12/13/17      PT SHORT TERM GOAL #2   Title  Patient will demonstrate improve right knee strength of 1/2 MMT grade in all limited planes in order to improve functional mobility.     Baseline  Limited by 3-/5 in knee flexion/extension.    Time  4    Period  Weeks    Status  New    Target Date  12/13/17      PT SHORT TERM GOAL #3   Title  Patient will demonstrate ability to perform single limb stance on right lower extremity for 5 seconds in order to assist with stair ambulation.     Baseline  Patient  maintians for 2 seconds on right LE.     Time  4    Period  Weeks    Status  New    Target Date  12/13/17        PT Long Term Goals - 11/15/17 1657      PT LONG TERM GOAL #1   Title  Patient will demonstrate improved knee strength of 1 MMT in order to assist with functional mobility.     Baseline  Knee flexion extension on right is 3-/5    Time  8    Period  Weeks    Status  New    Target Date  01/10/18      PT LONG TERM GOAL #2   Title  Patient will demonstrate ability to perform TUG in 13 seconds indicating an improvement dynamic balance.      Baseline  Patient performs TUG in 23 seconds.     Time  8    Period  Weeks    Status  New    Target Date  01/10/18      PT LONG TERM GOAL #3   Title  Patient will demonstrate ability to perform single limb stance for 10 seconds in order to assist with static balance and navigating the environment.     Baseline  Patient maintains right SLS for 2 seconds.     Time  8    Period  Weeks    Status  New    Target Date  01/10/18       Plan - 11/17/17 1319    Clinical Impression Statement  Patient initiated first treatment session of therapy today. A portion of this session was focused on functional and subjective measures as well as instructions for pain management and importance of HEP compliance. She was able to initiate functional strengthening today, had improved form, and reduced pain with unweighted squat compared to a wall squat. She also progressed exercises for hip strengthening and added them to her HEP. She continues to be limited by poor right patellar tracking and alignment during functional activity and muscle weakness. She will benefit from continued skilled PT services to address impairments and advance towards goals to improve movement and QOL.    Rehab Potential  Good    Clinical Impairments Affecting Rehab Potential  Pain, ROM, strength, balance, edema    PT Frequency  2x / week    PT Duration  8 weeks    PT  Treatment/Interventions  ADLs/Self Care Home Management;Cryotherapy;Gait training;Stair training;Functional mobility training;Therapeutic activities;Therapeutic exercise;Balance training;Neuromuscular re-education;Patient/family education;Manual techniques;Passive range of motion    PT Next Visit Plan  Continue ROM exercises; quad strengthening, advance functional strengthening and continue hip strengthening. May benefit from trial of PFPS taping.    PT Home Exercise Plan  Ankle pumps 2x10; heel slides 2x10 both 2 times daily; 12/13 - bridge with band, clamshell, SAQ    Consulted and Agree with Plan of Care  Patient       Patient will benefit from skilled therapeutic intervention in order to improve the following deficits and impairments:  Abnormal gait, Decreased endurance, Hypomobility, Increased edema, Decreased activity tolerance, Decreased strength, Increased fascial restricitons, Pain, Decreased balance, Decreased mobility, Difficulty walking, Decreased range of motion, Improper body mechanics, Decreased coordination, Impaired flexibility, Postural dysfunction  Visit Diagnosis: Impaired  functional mobility, balance, gait, and endurance  Decreased range of motion (ROM) of right knee  Decreased strength involving knee joint     Problem List Patient Active Problem List   Diagnosis Date Noted  . GERD without esophagitis 03/28/2017  . Obesity (BMI 30-39.9) 03/28/2017  . Obesity 10/06/2016  . Lumbar foraminal stenosis 06/04/2016  . Carpal tunnel syndrome, bilateral 02/07/2014  . S/P lumbar spinal fusion 02/07/2014  . Obstructive sleep apnea 02/04/2014  . Hand arthritis 10/15/2013  . HTN (hypertension), benign 08/24/2013  . Hypertriglyceridemia 08/24/2013  . Iliotibial band tendonitis 02/08/2013  . Trigger finger, acquired 02/08/2013  . Low back pain 06/22/2011  . HNP (herniated nucleus pulposus) 06/22/2011  . HIP PAIN, RIGHT 12/15/2010  . LUMBOSACRAL SPONDYLOSIS WITHOUT  MYELOPATHY 12/15/2010  . SPINAL STENOSIS, LUMBAR 12/15/2010  . KNEE REPLACEMENT, HX OF 02/26/2010  . DERANGEMENT MENISCUS 07/03/2009  . KNEE, ARTHRITIS, DEGEN./OSTEO 05/22/2009  . KNEE PAIN 05/22/2009  . ANSERINE BURSITIS 05/22/2009  . TRIGGER FINGER 09/23/2008    Kipp Brood, PT, DPT Physical Therapist with [facility]  11/17/2017 4:24 PM    [place], [place], [address] Phone: [phone]   Fax:  [phone]  Name: Emily [name] MRN: [id number] Date of Birth: [birth year]

## 2017-11-17 NOTE — Patient Instructions (Signed)
   ELASTIC BAND - SIDELYING CLAM - CLAMSHELL: 1-2 sets of 10-15 repetitions  While lying on your side with your knees bent and an elastic band wrapped around your knees, draw up the top knee while keeping contact of your feet together as shown.   Do not let your pelvis roll back during the lifting movement.     SHORT ARC QUAD  - SAQ: 1-2 sets of 10-15 repetitions  Place a rolled up towel or object under your knee and slowly straighten your knee as your raise up  your foot.      BRIDGING ELASTIC BAND ABDUCTION: 1-2 sets of 10-15 repetitions  While lying on your back, place an elastic band around your knees and pull your knees apart. Hold this and then tighten your lower abdominals, squeeze your buttocks and raise your buttocks off the floor/bed as creating a "Bridge" with your body.

## 2017-11-22 ENCOUNTER — Encounter (HOSPITAL_COMMUNITY): Payer: Self-pay

## 2017-11-22 ENCOUNTER — Other Ambulatory Visit: Payer: Self-pay

## 2017-11-22 ENCOUNTER — Ambulatory Visit (HOSPITAL_COMMUNITY): Payer: PPO

## 2017-11-22 DIAGNOSIS — R29898 Other symptoms and signs involving the musculoskeletal system: Secondary | ICD-10-CM

## 2017-11-22 DIAGNOSIS — Z7409 Other reduced mobility: Secondary | ICD-10-CM | POA: Diagnosis not present

## 2017-11-22 DIAGNOSIS — M25661 Stiffness of right knee, not elsewhere classified: Secondary | ICD-10-CM

## 2017-11-22 NOTE — Therapy (Signed)
[place], [place], [address] Phone: [phone]   Fax:  [phone]  Physical Therapy Treatment  Patient Details  Name: Emily [name] MRN: [id number] Date of Birth: 02-13-1951 Referring Provider: [name], MD   Encounter Date: 11/22/2017  PT End of Session - 11/22/17 0925    Visit Number  3    Number of Visits  17    Date for PT Re-Evaluation  12/13/17    Authorization Type  [employer]    Authorization Time Period  11/15/2017- 01/16/2018     Authorization - Visit Number  3    Authorization - Number of Visits  10    PT Start Time  0904    PT Stop Time  0945    PT Time Calculation (min)  41 min    Activity Tolerance  Patient tolerated treatment well    Behavior During Therapy  [facility] for tasks assessed/performed       Past Medical History:  Diagnosis Date  . Acid reflux   . Anxiety   . CHF (congestive heart failure) (HCC)    90's swelling body-no breathing problems at that time-no cardiac -in dr s Wolfgang [place] office [place] give lasix and potassium  . Complication of anesthesia    History of being told she was allergic to something in 1980 no problems since  . Depression   . Fibromyalgia   . History of blood transfusion reaction 1978  . History of hiatal hernia   . HNP (herniated nucleus pulposus)   . Hypertension    Does not see a cardiologist no stress or echo  . Joint pain   . Nerve pain    right leg   . Ruptured lumbar disc   . Sleep apnea    Uses CPAP every night setting at 7 mm/Hg    Past Surgical History:  Procedure Laterality Date  . BACK SURGERY     x2  . BLADDER REPAIR     tear during during birth of child  . CARPAL TUNNEL RELEASE Bilateral   . CESAREAN SECTION    . CHOLECYSTECTOMY     gallbladder disease; stayed sick  . COLONOSCOPY N/A 04/28/2016   Procedure: COLONOSCOPY;  Surgeon: [name], MD;  [facility];  Service: Endoscopy;  Laterality:  N/A;  . ESOPHAGOGASTRODUODENOSCOPY N/A 04/28/2016   Procedure: ESOPHAGOGASTRODUODENOSCOPY (EGD);  Surgeon: [name], MD;  [facility];  Service: Endoscopy;  Laterality: N/A;  12:00  . GALLBLADDER SURGERY    . knee right (torn Cart)    . REPLACEMENT TOTAL KNEE     left  . ROTATOR CUFF REPAIR     right  . SPINAL FUSION    . TUBAL LIGATION    . vein right leg removed    . wrist ganglion cyst left      There were no vitals filed for this visit.  Subjective Assessment - 11/22/17 0912    Subjective  Patient states she had a good weekend but reports that all of her exercises are causing some pain, she is unsure if it is muscle soreness versus pain.    Pertinent History  Osteoarthritis moderate in Right knee; TKA in Left knee    Limitations  Sitting;Standing;Walking;House hold activities;Lifting    Patient Stated Goals  being able to sit and walk without it being painful; to be able to squat or bend over; bring leg up to tie shoes  Currently in Pain?  Yes    Pain Score  5     Pain Location  Knee    Pain Orientation  Right    Pain Descriptors / Indicators  Aching    Pain Type  Chronic pain    Pain Radiating Towards  above andb elow knee and in back of knee    Pain Onset  1 to 4 weeks ago    Pain Frequency  Constant    Aggravating Factors   sitting for a long time or walking for more than 15-20 minutes    Pain Relieving Factors  tylenol    Effect of Pain on Daily Activities  limited in standing and walking    Multiple Pain Sites  No      [facility] Adult PT Treatment/Exercise - 11/22/17 0001      Knee/Hip Exercises: Machines for Strengthening   Total Gym Leg Press  1x 20 reps for functional squat, 29* angle      Knee/Hip Exercises: Standing   Terminal Knee Extension  AROM;Strengthening;Right;1 set;20 reps;Theraband    Theraband Level (Terminal Knee Extension)  Level 4 (Blue)    Step Down  2 sets;Right;10 reps;Step Height: 2"    Rocker Board  Limitations;4 minutes     Rocker Board Limitations  2 x 2 minutes, lateral    Other Standing Knee Exercises  side stepping with red theraband, 2x both directions 15 feet      Knee/Hip Exercises: Supine   Short Arc Quad Sets  AROM;Strengthening;Right;2 sets;10 reps;Limitations    Short Arc Quad Sets Limitations  2 lbs    Bridges  AROM;Strengthening;Both;Limitations;1 set;15 reps      Knee/Hip Exercises: Sidelying   Hip ABduction  AROM;Strengthening;Both;2 sets;10 reps;Limitations    Hip ABduction Limitations  2 lbs      Manual Therapy   Manual Therapy  Taping    Manual therapy comments  performed seperate from other services    McConnell  applied K-tape in mcconnel method for PFPS to facilitate pateller tracking, ~ 2 minutes (not billed)       PT Education - 11/22/17 0865    Education provided  Yes    Education Details  Reveiwed proper form of exercises. Instructedon safe progression of SAQ with ankle weight. Educated on benefits of taping and precautions to be alert for skin reactions to adhesives.     Person(s) Educated  Patient    Methods  Explanation    Comprehension  Verbalized understanding       PT Short Term Goals - 11/15/17 1654      PT SHORT TERM GOAL #1   Title  Patient will report understandign and regular compliance with HEP.     Baseline  Patient educated about HEP.     Time  4    Period  Weeks    Status  New    Target Date  12/13/17      PT SHORT TERM GOAL #2   Title  Patient will demonstrate improve right knee strength of 1/2 MMT grade in all limited planes in order to improve functional mobility.     Baseline  Limited by 3-/5 in knee flexion/extension.    Time  4    Period  Weeks    Status  New    Target Date  12/13/17      PT SHORT TERM GOAL #3   Title  Patient will demonstrate ability to perform single limb stance on right lower extremity for 5 seconds in  order to assist with stair ambulation.     Baseline  Patient maintians for 2 seconds on right LE.     Time  4    Period   Weeks    Status  New    Target Date  12/13/17        PT Long Term Goals - 11/15/17 1657      PT LONG TERM GOAL #1   Title  Patient will demonstrate improved knee strength of 1 MMT in order to assist with functional mobility.     Baseline  Knee flexion extension on right is 3-/5    Time  8    Period  Weeks    Status  New    Target Date  01/10/18      PT LONG TERM GOAL #2   Title  Patient will demonstrate ability to perform TUG in 13 seconds indicating an improvement dynamic balance.      Baseline  Patient performs TUG in 23 seconds.     Time  8    Period  Weeks    Status  New    Target Date  01/10/18      PT LONG TERM GOAL #3   Title  Patient will demonstrate ability to perform single limb stance for 10 seconds in order to assist with static balance and navigating the environment.     Baseline  Patient maintains right SLS for 2 seconds.     Time  8    Period  Weeks    Status  New    Target Date  01/10/18       Plan - 11/22/17 6967    Clinical Impression Statement  Patient has continued to progress in therapy and is advancing functional strengthening. She continues to have decreased pain with unweighted squat and remains limited by poor right patellar tracking with CKC exercises. Today we trialed McConnel taping using K-tape and therapist instructed patient in precautions and educated to pay attention to any adverse reactions to adhesive, patient denied history of adhesives previously. She will continue to benefit from skilled PT services to address impairments and progress towards goals, decrease pain, and improve QOL.    Rehab Potential  Good    Clinical Impairments Affecting Rehab Potential  Pain, ROM, strength, balance, edema    PT Frequency  2x / week    PT Duration  8 weeks    PT Treatment/Interventions  ADLs/Self Care Home Management;Cryotherapy;Gait training;Stair training;Functional mobility training;Therapeutic activities;Therapeutic exercise;Balance  training;Neuromuscular re-education;Patient/family education;Manual techniques;Passive range of motion    PT Next Visit Plan  Continue ROM exercises; quad strengthening, advance functional strengthening and continue hip strengthening. Side stepping with band. Continue with McConnel taping (try to use leukotape) if patient reports benefit.     PT Home Exercise Plan  Ankle pumps 2x10; heel slides 2x10 both 2 times daily; 12/13 - bridge with band, clamshell, SAQ    Consulted and Agree with Plan of Care  Patient       Patient will benefit from skilled therapeutic intervention in order to improve the following deficits and impairments:  Abnormal gait, Decreased endurance, Hypomobility, Increased edema, Decreased activity tolerance, Decreased strength, Increased fascial restricitons, Pain, Decreased balance, Decreased mobility, Difficulty walking, Decreased range of motion, Improper body mechanics, Decreased coordination, Impaired flexibility, Postural dysfunction  Visit Diagnosis: Impaired functional mobility, balance, gait, and endurance  Decreased range of motion (ROM) of right knee  Decreased strength involving knee joint     Problem List Patient Active  Problem List   Diagnosis Date Noted  . GERD without esophagitis 03/28/2017  . Obesity (BMI 30-39.9) 03/28/2017  . Obesity 10/06/2016  . Lumbar foraminal stenosis 06/04/2016  . Carpal tunnel syndrome, bilateral 02/07/2014  . S/P lumbar spinal fusion 02/07/2014  . Obstructive sleep apnea 02/04/2014  . Hand arthritis 10/15/2013  . HTN (hypertension), benign 08/24/2013  . Hypertriglyceridemia 08/24/2013  . Iliotibial band tendonitis 02/08/2013  . Trigger finger, acquired 02/08/2013  . Low back pain 06/22/2011  . HNP (herniated nucleus pulposus) 06/22/2011  . HIP PAIN, RIGHT 12/15/2010  . LUMBOSACRAL SPONDYLOSIS WITHOUT MYELOPATHY 12/15/2010  . SPINAL STENOSIS, LUMBAR 12/15/2010  . KNEE REPLACEMENT, HX OF 02/26/2010  . DERANGEMENT  MENISCUS 07/03/2009  . KNEE, ARTHRITIS, DEGEN./OSTEO 05/22/2009  . KNEE PAIN 05/22/2009  . ANSERINE BURSITIS 05/22/2009  . TRIGGER FINGER 09/23/2008    Kipp Brood, PT, DPT Physical Therapist with [facility]  11/22/2017 9:59 AM    [place], [place], [address] Phone: [phone]   Fax:  [phone]  Name: Emily [name] MRN: [id number] Date of Birth: 1951-11-17

## 2017-11-24 ENCOUNTER — Ambulatory Visit (HOSPITAL_COMMUNITY): Payer: PPO

## 2017-11-24 ENCOUNTER — Encounter (HOSPITAL_COMMUNITY): Payer: Self-pay

## 2017-11-24 DIAGNOSIS — R29898 Other symptoms and signs involving the musculoskeletal system: Secondary | ICD-10-CM

## 2017-11-24 DIAGNOSIS — M25661 Stiffness of right knee, not elsewhere classified: Secondary | ICD-10-CM

## 2017-11-24 DIAGNOSIS — Z7409 Other reduced mobility: Secondary | ICD-10-CM

## 2017-11-24 NOTE — Therapy (Signed)
[place], [place], [address] Phone: [phone]   Fax:  [phone]  Physical Therapy Treatment  Patient Details  Name: Emily [name] MRN: [id number] Date of Birth: 01-22-1951 Referring Provider: Arther Abbott, MD   Encounter Date: 11/24/2017  PT End of Session - 11/24/17 0911    Visit Number  4    Number of Visits  17    Date for PT Re-Evaluation  12/13/17    Authorization Type  [employer]    Authorization Time Period  11/15/2017- 01/16/2018     Authorization - Visit Number  4    Authorization - Number of Visits  10    PT Start Time  0903    PT Stop Time  0945    PT Time Calculation (min)  42 min    Activity Tolerance  Patient tolerated treatment well;Patient limited by pain    Behavior During Therapy  [facility] for tasks assessed/performed       Past Medical History:  Diagnosis Date  . Acid reflux   . Anxiety   . CHF (congestive heart failure) (HCC)    90's swelling body-no breathing problems at that time-no cardiac -in dr s Wolfgang [place] office [place] give lasix and potassium  . Complication of anesthesia    History of being told she was allergic to something in 1980 no problems since  . Depression   . Fibromyalgia   . History of blood transfusion reaction 1978  . History of hiatal hernia   . HNP (herniated nucleus pulposus)   . Hypertension    Does not see a cardiologist no stress or echo  . Joint pain   . Nerve pain    right leg   . Ruptured lumbar disc   . Sleep apnea    Uses CPAP every night setting at 7 mm/Hg    Past Surgical History:  Procedure Laterality Date  . BACK SURGERY     x2  . BLADDER REPAIR     tear during during birth of child  . CARPAL TUNNEL RELEASE Bilateral   . CESAREAN SECTION    . CHOLECYSTECTOMY     gallbladder disease; stayed sick  . COLONOSCOPY N/A 04/28/2016   Procedure: COLONOSCOPY;  Surgeon: [name], MD;  [facility];  Service:  Endoscopy;  Laterality: N/A;  . ESOPHAGOGASTRODUODENOSCOPY N/A 04/28/2016   Procedure: ESOPHAGOGASTRODUODENOSCOPY (EGD);  Surgeon: [name], MD;  [facility];  Service: Endoscopy;  Laterality: N/A;  12:00  . GALLBLADDER SURGERY    . knee right (torn Cart)    . REPLACEMENT TOTAL KNEE     left  . ROTATOR CUFF REPAIR     right  . SPINAL FUSION    . TUBAL LIGATION    . vein right leg removed    . wrist ganglion cyst left      There were no vitals filed for this visit.  Subjective Assessment - 11/24/17 0909    Subjective  Pt stated she has pain scale 4/10, stated she was a little irritated but after couple of days feels the tape assisted in stability.  No reports of recent falls and compliance with HEP.      Pertinent History  Osteoarthritis moderate in Right knee; TKA in Left knee    Patient Stated Goals  being able to sit and walk without it being painful; to be able to squat or bend over; bring leg up to tie shoes  Currently in Pain?  Yes    Pain Score  4     Pain Location  Knee    Pain Orientation  Right    Pain Descriptors / Indicators  Aching;Sore    Pain Type  Chronic pain    Pain Onset  1 to 4 weeks ago    Pain Frequency  Constant    Aggravating Factors   sitting for a long time or walking for more than 15-20 minutes    Pain Relieving Factors  tylenol    Effect of Pain on Daily Activities  limited in standing and walking         [facility] Assessment - 11/24/17 0001      Assessment   Medical Diagnosis  Patella subluxation, knee OA     Referring Provider  Arther Abbott, MD    Onset Date/Surgical Date  11/15/17    Next MD Visit  12/12/2017    Prior Therapy  After Left total knee replacement      AROM   Right Knee Extension  3    Right Knee Flexion  118                  [facility] Adult PT Treatment/Exercise - 11/24/17 0001      Knee/Hip Exercises: Stretches   Active Hamstring Stretch  2 reps;30 seconds supine      Knee/Hip Exercises:  Machines for Strengthening   Total Gym Leg Press  1x 20 reps for functional squat, 29* angle      Knee/Hip Exercises: Standing   Lateral Step Up  10 reps;Hand Hold: 2;Step Height: 2" cueing to improve    Step Down  15 reps;Hand Hold: 2;Step Height: 4"    Wall Squat  10 reps;3 seconds with ball between knees    Other Standing Knee Exercises  side stepping with red theraband, 2x both directions 15 feet      Knee/Hip Exercises: Supine   Short Arc Quad Sets  20 reps    Short Arc Quad Sets Limitations  2    Straight Leg Raises  10 reps      Knee/Hip Exercises: Sidelying   Hip ABduction  15 reps    Hip ABduction Limitations  2 lbs, HEP      Manual Therapy   Manual Therapy  Taping    Manual therapy comments  performed seperate from other services    Kinesiotex  Facilitate Muscle      Kinesiotix   Facilitate Muscle   Patella tracking               PT Short Term Goals - 11/15/17 1654      PT SHORT TERM GOAL #1   Title  Patient will report understandign and regular compliance with HEP.     Baseline  Patient educated about HEP.     Time  4    Period  Weeks    Status  New    Target Date  12/13/17      PT SHORT TERM GOAL #2   Title  Patient will demonstrate improve right knee strength of 1/2 MMT grade in all limited planes in order to improve functional mobility.     Baseline  Limited by 3-/5 in knee flexion/extension.    Time  4    Period  Weeks    Status  New    Target Date  12/13/17      PT SHORT TERM GOAL #3   Title  Patient will  demonstrate ability to perform single limb stance on right lower extremity for 5 seconds in order to assist with stair ambulation.     Baseline  Patient maintians for 2 seconds on right LE.     Time  4    Period  Weeks    Status  New    Target Date  12/13/17        PT Long Term Goals - 11/15/17 1657      PT LONG TERM GOAL #1   Title  Patient will demonstrate improved knee strength of 1 MMT in order to assist with functional  mobility.     Baseline  Knee flexion extension on right is 3-/5    Time  8    Period  Weeks    Status  New    Target Date  01/10/18      PT LONG TERM GOAL #2   Title  Patient will demonstrate ability to perform TUG in 13 seconds indicating an improvement dynamic balance.      Baseline  Patient performs TUG in 23 seconds.     Time  8    Period  Weeks    Status  New    Target Date  01/10/18      PT LONG TERM GOAL #3   Title  Patient will demonstrate ability to perform single limb stance for 10 seconds in order to assist with static balance and navigating the environment.     Baseline  Patient maintains right SLS for 2 seconds.     Time  8    Period  Weeks    Status  New    Target Date  01/10/18            Plan - 11/24/17 0946    Clinical Impression Statement  Session focus on quad strengthening with increased focus on patella tracking/continue.  Continued with taping techniques to assist with patella tracking.  Overall pt.'s knee is improving with mobility wtih AROM 3-118 degrees todays session though does continue to be limited with pain.  Able to increase height with step downs and began lateral step ups with cueing for knee positioning.      Rehab Potential  Good    Clinical Impairments Affecting Rehab Potential  Pain, ROM, strength, balance, edema    PT Frequency  2x / week    PT Duration  8 weeks    PT Treatment/Interventions  ADLs/Self Care Home Management;Cryotherapy;Gait training;Stair training;Functional mobility training;Therapeutic activities;Therapeutic exercise;Balance training;Neuromuscular re-education;Patient/family education;Manual techniques;Passive range of motion    PT Next Visit Plan  Continue ROM exercises; quad strengthening, advance functional strengthening and continue hip strengthening. Continue with McConnel taping (try to use leukotape) if patient reports benefit.     PT Home Exercise Plan  Ankle pumps 2x10; heel slides 2x10 both 2 times daily; 12/13 -  bridge with band, clamshell, SAQ       Patient will benefit from skilled therapeutic intervention in order to improve the following deficits and impairments:  Abnormal gait, Decreased endurance, Hypomobility, Increased edema, Decreased activity tolerance, Decreased strength, Increased fascial restricitons, Pain, Decreased balance, Decreased mobility, Difficulty walking, Decreased range of motion, Improper body mechanics, Decreased coordination, Impaired flexibility, Postural dysfunction  Visit Diagnosis: Impaired functional mobility, balance, gait, and endurance  Decreased range of motion (ROM) of right knee  Decreased strength involving knee joint     Problem List Patient Active Problem List   Diagnosis Date Noted  . GERD without esophagitis 03/28/2017  . Obesity (  BMI 30-39.9) 03/28/2017  . Obesity 10/06/2016  . Lumbar foraminal stenosis 06/04/2016  . Carpal tunnel syndrome, bilateral 02/07/2014  . S/P lumbar spinal fusion 02/07/2014  . Obstructive sleep apnea 02/04/2014  . Hand arthritis 10/15/2013  . HTN (hypertension), benign 08/24/2013  . Hypertriglyceridemia 08/24/2013  . Iliotibial band tendonitis 02/08/2013  . Trigger finger, acquired 02/08/2013  . Low back pain 06/22/2011  . HNP (herniated nucleus pulposus) 06/22/2011  . HIP PAIN, RIGHT 12/15/2010  . LUMBOSACRAL SPONDYLOSIS WITHOUT MYELOPATHY 12/15/2010  . SPINAL STENOSIS, LUMBAR 12/15/2010  . KNEE REPLACEMENT, HX OF 02/26/2010  . DERANGEMENT MENISCUS 07/03/2009  . KNEE, ARTHRITIS, DEGEN./OSTEO 05/22/2009  . KNEE PAIN 05/22/2009  . ANSERINE BURSITIS 05/22/2009  . TRIGGER FINGER 09/23/2008   Ihor Austin, [place]; [place]  Aldona Lento 11/24/2017, 12:04 PM  [place], [place], [address] Phone: [phone]   Fax:  [phone]  Name: Emily [name] MRN: [id number] Date of Birth: [birth year]

## 2017-11-30 ENCOUNTER — Encounter (HOSPITAL_COMMUNITY): Payer: Self-pay

## 2017-11-30 ENCOUNTER — Ambulatory Visit (HOSPITAL_COMMUNITY): Payer: PPO

## 2017-11-30 ENCOUNTER — Other Ambulatory Visit: Payer: Self-pay

## 2017-11-30 DIAGNOSIS — Z7409 Other reduced mobility: Secondary | ICD-10-CM | POA: Diagnosis not present

## 2017-11-30 DIAGNOSIS — M25661 Stiffness of right knee, not elsewhere classified: Secondary | ICD-10-CM

## 2017-11-30 DIAGNOSIS — R29898 Other symptoms and signs involving the musculoskeletal system: Secondary | ICD-10-CM

## 2017-11-30 NOTE — Therapy (Signed)
[place], [place], [address] Phone: [phone]   Fax:  [phone]  Physical Therapy Treatment  Patient Details  Name: Emily [name] MRN: [id number] Date of Birth: 03-Nov-1951 Referring Provider: Arther Abbott, MD   Encounter Date: 11/30/2017  PT End of Session - 11/30/17 0906    Visit Number  5    Number of Visits  17    Date for PT Re-Evaluation  12/13/17    Authorization Type  [employer]    Authorization Time Period  11/15/2017- 01/16/2018     Authorization - Visit Number  5    Authorization - Number of Visits  10    PT Start Time  0906    PT Stop Time  0947    PT Time Calculation (min)  41 min    Activity Tolerance  Patient tolerated treatment well;Patient limited by pain    Behavior During Therapy  [facility] for tasks assessed/performed       Past Medical History:  Diagnosis Date  . Acid reflux   . Anxiety   . CHF (congestive heart failure) (HCC)    90's swelling body-no breathing problems at that time-no cardiac -in dr s Wolfgang [place] office [place] give lasix and potassium  . Complication of anesthesia    History of being told she was allergic to something in 1980 no problems since  . Depression   . Fibromyalgia   . History of blood transfusion reaction 1978  . History of hiatal hernia   . HNP (herniated nucleus pulposus)   . Hypertension    Does not see a cardiologist no stress or echo  . Joint pain   . Nerve pain    right leg   . Ruptured lumbar disc   . Sleep apnea    Uses CPAP every night setting at 7 mm/Hg    Past Surgical History:  Procedure Laterality Date  . BACK SURGERY     x2  . BLADDER REPAIR     tear during during birth of child  . CARPAL TUNNEL RELEASE Bilateral   . CESAREAN SECTION    . CHOLECYSTECTOMY     gallbladder disease; stayed sick  . COLONOSCOPY N/A 04/28/2016   Procedure: COLONOSCOPY;  Surgeon: [name], MD;  [facility];  Service:  Endoscopy;  Laterality: N/A;  . ESOPHAGOGASTRODUODENOSCOPY N/A 04/28/2016   Procedure: ESOPHAGOGASTRODUODENOSCOPY (EGD);  Surgeon: [name], MD;  [facility];  Service: Endoscopy;  Laterality: N/A;  12:00  . GALLBLADDER SURGERY    . knee right (torn Cart)    . REPLACEMENT TOTAL KNEE     left  . ROTATOR CUFF REPAIR     right  . SPINAL FUSION    . TUBAL LIGATION    . vein right leg removed    . wrist ganglion cyst left      There were no vitals filed for this visit.  Subjective Assessment - 11/30/17 0925    Subjective  Patient reports she is sore today but had a good Christmas and was able to participate in HEP over the holiday. She feels the tape is still helping.    Pertinent History  Osteoarthritis moderate in Right knee; TKA in Left knee    Patient Stated Goals  being able to sit and walk without it being painful; to be able to squat or bend over; bring leg up to tie shoes    Currently in Pain?  Yes  Pain Score  3     Pain Location  Knee    Pain Orientation  Right    Pain Descriptors / Indicators  Aching;Sore    Pain Type  Chronic pain    Pain Onset  More than a month ago    Pain Frequency  Constant    Aggravating Factors   sitting for a long time and walking for 15-20 minutes    Pain Relieving Factors  tylenol    Effect of Pain on Daily Activities  limited in standing and walking mostly    Multiple Pain Sites  No       [facility] Adult PT Treatment/Exercise - 11/30/17 0001      Knee/Hip Exercises: Stretches   Active Hamstring Stretch  2 reps;30 seconds 12" box      Knee/Hip Exercises: Machines for Strengthening   Total Gym Leg Press  2x 15 reps for functional squat, 29* angle      Knee/Hip Exercises: Standing   Lateral Step Up  20 reps;Hand Hold: 2;Step Height: 4"    Forward Step Up  20 reps;Hand Hold: 2;Step Height: 4"    Step Down  20 reps;Hand Hold: 2;Step Height: 4"    Other Standing Knee Exercises  side stepping with red theraband, 3x both  directions 15 feet      Knee/Hip Exercises: Seated   Long Arc Quad  Strengthening;Both;2 sets;10 reps;Limitations    Long Arc Quad Limitations  3-5 second holds      Knee/Hip Exercises: Supine   Straight Leg Raises  Right;15 reps      Knee/Hip Exercises: Sidelying   Other Sidelying Knee/Hip Exercises  reverse clamshell, 15 repetitions, BLE      Manual Therapy   Manual Therapy  Taping;Soft tissue mobilization    Manual therapy comments  performed seperate from other services    Soft tissue mobilization  STW to medial hamstring tendon, pateitn reported pain relief     McConnell  applied K-tape in mcconnel method for PFPS to facilitate pateller tracking, ~ 2 minutes (not billed)        PT Education - 11/30/17 0928    Education provided  Yes    Education Details  Educated on exercise technique and overall improvements with being able to progress functional strengthening.    Person(s) Educated  Patient    Methods  Explanation    Comprehension  Verbalized understanding;Returned demonstration       PT Short Term Goals - 11/15/17 1654      PT SHORT TERM GOAL #1   Title  Patient will report understandign and regular compliance with HEP.     Baseline  Patient educated about HEP.     Time  4    Period  Weeks    Status  New    Target Date  12/13/17      PT SHORT TERM GOAL #2   Title  Patient will demonstrate improve right knee strength of 1/2 MMT grade in all limited planes in order to improve functional mobility.     Baseline  Limited by 3-/5 in knee flexion/extension.    Time  4    Period  Weeks    Status  New    Target Date  12/13/17      PT SHORT TERM GOAL #3   Title  Patient will demonstrate ability to perform single limb stance on right lower extremity for 5 seconds in order to assist with stair ambulation.     Baseline  Patient maintians for 2 seconds on right LE.     Time  4    Period  Weeks    Status  New    Target Date  12/13/17        PT Long Term Goals -  11/15/17 1657      PT LONG TERM GOAL #1   Title  Patient will demonstrate improved knee strength of 1 MMT in order to assist with functional mobility.     Baseline  Knee flexion extension on right is 3-/5    Time  8    Period  Weeks    Status  New    Target Date  01/10/18      PT LONG TERM GOAL #2   Title  Patient will demonstrate ability to perform TUG in 13 seconds indicating an improvement dynamic balance.      Baseline  Patient performs TUG in 23 seconds.     Time  8    Period  Weeks    Status  New    Target Date  01/10/18      PT LONG TERM GOAL #3   Title  Patient will demonstrate ability to perform single limb stance for 10 seconds in order to assist with static balance and navigating the environment.     Baseline  Patient maintains right SLS for 2 seconds.     Time  8    Period  Weeks    Status  New    Target Date  01/10/18          Plan - 11/30/17 0915    Clinical Impression Statement  Patient is continuing to progress in therapy and continues to report improve stability with use of tape. She advanced functional strengthening today by increasing step height for her step up with no increase in pain. She reported some pain in right knee along medial hamstring tendon and STW was performed at the end of session to decrease pain, patient responded well to treatment. Her patellar tracking is improving overall and her tolerance to CKC strengthening with increase ROM. She will continue to benefit from skilled PT services to address impairments and progress towards goals, decrease pain, and improve QOL.    Rehab Potential  Good    Clinical Impairments Affecting Rehab Potential  Pain, ROM, strength, balance, edema    PT Frequency  2x / week    PT Duration  8 weeks    PT Treatment/Interventions  ADLs/Self Care Home Management;Cryotherapy;Gait training;Stair training;Functional mobility training;Therapeutic activities;Therapeutic exercise;Balance training;Neuromuscular  re-education;Patient/family education;Manual techniques;Passive range of motion    PT Next Visit Plan   Continue with McConnel taping (try to use leukotape) if patient reports benefit. Teach patient technique for self application. Continue ROM exercises; quad strengthening, advance functional strengthening and continue hip strengthening. Progress TB for side stepping and add to HEP. Attemp wall squat next session.    PT Home Exercise Plan  Ankle pumps 2x10; heel slides 2x10 both 2 times daily; 12/13 - bridge with band, clamshell, SAQ    Consulted and Agree with Plan of Care  Patient       Patient will benefit from skilled therapeutic intervention in order to improve the following deficits and impairments:  Abnormal gait, Decreased endurance, Hypomobility, Increased edema, Decreased activity tolerance, Decreased strength, Increased fascial restricitons, Pain, Decreased balance, Decreased mobility, Difficulty walking, Decreased range of motion, Improper body mechanics, Decreased coordination, Impaired flexibility, Postural dysfunction  Visit Diagnosis: Impaired functional mobility, balance, gait, and endurance  Decreased range of motion (ROM) of right knee  Decreased strength involving knee joint     Problem List Patient Active Problem List   Diagnosis Date Noted  . GERD without esophagitis 03/28/2017  . Obesity (BMI 30-39.9) 03/28/2017  . Obesity 10/06/2016  . Lumbar foraminal stenosis 06/04/2016  . Carpal tunnel syndrome, bilateral 02/07/2014  . S/P lumbar spinal fusion 02/07/2014  . Obstructive sleep apnea 02/04/2014  . Hand arthritis 10/15/2013  . HTN (hypertension), benign 08/24/2013  . Hypertriglyceridemia 08/24/2013  . Iliotibial band tendonitis 02/08/2013  . Trigger finger, acquired 02/08/2013  . Low back pain 06/22/2011  . HNP (herniated nucleus pulposus) 06/22/2011  . HIP PAIN, RIGHT 12/15/2010  . LUMBOSACRAL SPONDYLOSIS WITHOUT MYELOPATHY 12/15/2010  . SPINAL STENOSIS,  LUMBAR 12/15/2010  . KNEE REPLACEMENT, HX OF 02/26/2010  . DERANGEMENT MENISCUS 07/03/2009  . KNEE, ARTHRITIS, DEGEN./OSTEO 05/22/2009  . KNEE PAIN 05/22/2009  . ANSERINE BURSITIS 05/22/2009  . TRIGGER FINGER 09/23/2008     Kipp Brood, PT, DPT Physical Therapist with [facility]  11/30/2017 11:04 AM    [place], [place], [address] Phone: [phone]   Fax:  [phone]  Name: Emily [name] MRN: [id number] Date of Birth: 1951/05/28

## 2017-12-02 ENCOUNTER — Ambulatory Visit (HOSPITAL_COMMUNITY): Payer: PPO

## 2017-12-02 ENCOUNTER — Encounter (HOSPITAL_COMMUNITY): Payer: Self-pay

## 2017-12-02 DIAGNOSIS — R29898 Other symptoms and signs involving the musculoskeletal system: Secondary | ICD-10-CM

## 2017-12-02 DIAGNOSIS — Z7409 Other reduced mobility: Secondary | ICD-10-CM | POA: Diagnosis not present

## 2017-12-02 DIAGNOSIS — M25661 Stiffness of right knee, not elsewhere classified: Secondary | ICD-10-CM

## 2017-12-02 NOTE — Therapy (Signed)
[place], [place], [address] Phone: [phone]   Fax:  [phone]  Physical Therapy Treatment  Patient Details  Name: Emily [name] MRN: [id number] Date of Birth: 18-Nov-1951 Referring Provider: Arther Abbott, MD   Encounter Date: 12/02/2017  PT End of Session - 12/02/17 1006    Visit Number  6    Number of Visits  17    Date for PT Re-Evaluation  12/13/17    Authorization Type  [employer]    Authorization Time Period  11/15/2017- 01/16/2018     Authorization - Visit Number  6    Authorization - Number of Visits  10    PT Start Time  0951    PT Stop Time  1029    PT Time Calculation (min)  38 min    Activity Tolerance  Patient limited by pain    Behavior During Therapy  [facility] for tasks assessed/performed       Past Medical History:  Diagnosis Date  . Acid reflux   . Anxiety   . CHF (congestive heart failure) (HCC)    90's swelling body-no breathing problems at that time-no cardiac -in dr s Wolfgang [place] office [place] give lasix and potassium  . Complication of anesthesia    History of being told she was allergic to something in 1980 no problems since  . Depression   . Fibromyalgia   . History of blood transfusion reaction 1978  . History of hiatal hernia   . HNP (herniated nucleus pulposus)   . Hypertension    Does not see a cardiologist no stress or echo  . Joint pain   . Nerve pain    right leg   . Ruptured lumbar disc   . Sleep apnea    Uses CPAP every night setting at 7 mm/Hg    Past Surgical History:  Procedure Laterality Date  . BACK SURGERY     x2  . BLADDER REPAIR     tear during during birth of child  . CARPAL TUNNEL RELEASE Bilateral   . CESAREAN SECTION    . CHOLECYSTECTOMY     gallbladder disease; stayed sick  . COLONOSCOPY N/A 04/28/2016   Procedure: COLONOSCOPY;  Surgeon: [name], MD;  [facility];  Service: Endoscopy;  Laterality: N/A;  .  ESOPHAGOGASTRODUODENOSCOPY N/A 04/28/2016   Procedure: ESOPHAGOGASTRODUODENOSCOPY (EGD);  Surgeon: [name], MD;  [facility];  Service: Endoscopy;  Laterality: N/A;  12:00  . GALLBLADDER SURGERY    . knee right (torn Cart)    . REPLACEMENT TOTAL KNEE     left  . ROTATOR CUFF REPAIR     right  . SPINAL FUSION    . TUBAL LIGATION    . vein right leg removed    . wrist ganglion cyst left      There were no vitals filed for this visit.  Subjective Assessment - 12/02/17 0953    Subjective  Pt stated her knee "gave on her" yesterday while walking outdoor on solid surfaces, reports of increased pain today.  She feels the tape is helpful.    Pertinent History  Osteoarthritis moderate in Right knee; TKA in Left knee    Patient Stated Goals  being able to sit and walk without it being painful; to be able to squat or bend over; bring leg up to tie shoes    Currently in Pain?  Yes    Pain Score  9     Pain Location  Knee    Pain Orientation  Right    Pain Descriptors / Indicators  Sore;Aching;Tender;Tightness;Sharp sharp with movement    Pain Type  Chronic pain    Pain Onset  More than a month ago    Pain Frequency  Constant    Aggravating Factors   sitting for a long time and walking for 15-20 minutes    Pain Relieving Factors  tylenol    Effect of Pain on Daily Activities  limited in standing and walking mostly                      [facility] Adult PT Treatment/Exercise - 12/02/17 0001      Knee/Hip Exercises: Stretches   Active Hamstring Stretch  2 reps;30 seconds supine    ITB Stretch  3 reps;30 seconds supine      Knee/Hip Exercises: Machines for Strengthening   Total Gym Leg Press  2x 15 reps for functional squat, 29* angle      Knee/Hip Exercises: Standing   Wall Squat  5 sets ball between knees; limited by pain      Knee/Hip Exercises: Supine   Bridges  20 reps    Bridges Limitations  ball squeeze for VMO    Straight Leg Raise with External  Rotation  Right;10 reps      Manual Therapy   Manual Therapy  Taping;Soft tissue mobilization    Manual therapy comments  performed seperate from other services    Soft tissue mobilization  STM to quad, ITB and medial hamstrings    Kinesiotex  Facilitate Muscle      Kinesiotix   Facilitate Muscle   Patella tracking             PT Education - 12/02/17 1031    Education provided  Yes    Education Details  Discussed kinesiotape, places of purchase and encouraged to purchase and bring in packet for self care.      Person(s) Educated  Patient    Methods  Explanation    Comprehension  Verbalized understanding;Need further instruction       PT Short Term Goals - 11/15/17 1654      PT SHORT TERM GOAL #1   Title  Patient will report understandign and regular compliance with HEP.     Baseline  Patient educated about HEP.     Time  4    Period  Weeks    Status  New    Target Date  12/13/17      PT SHORT TERM GOAL #2   Title  Patient will demonstrate improve right knee strength of 1/2 MMT grade in all limited planes in order to improve functional mobility.     Baseline  Limited by 3-/5 in knee flexion/extension.    Time  4    Period  Weeks    Status  New    Target Date  12/13/17      PT SHORT TERM GOAL #3   Title  Patient will demonstrate ability to perform single limb stance on right lower extremity for 5 seconds in order to assist with stair ambulation.     Baseline  Patient maintians for 2 seconds on right LE.     Time  4    Period  Weeks    Status  New    Target Date  12/13/17        PT Long Term Goals - 11/15/17  [place] #1   Title  Patient will demonstrate improved knee strength of 1 MMT in order to assist with functional mobility.     Baseline  Knee flexion extension on right is 3-/5    Time  8    Period  Weeks    Status  New    Target Date  01/10/18      PT LONG TERM GOAL #2   Title  Patient will demonstrate ability to perform TUG in  13 seconds indicating an improvement dynamic balance.      Baseline  Patient performs TUG in 23 seconds.     Time  8    Period  Weeks    Status  New    Target Date  01/10/18      PT LONG TERM GOAL #3   Title  Patient will demonstrate ability to perform single limb stance for 10 seconds in order to assist with static balance and navigating the environment.     Baseline  Patient maintains right SLS for 2 seconds.     Time  8    Period  Weeks    Status  New    Target Date  01/10/18            Plan - 12/02/17 1019    Clinical Impression Statement  Pt limited by pain this session, reports of patella giving out lateral yesterday.  Session focus on manual techniuqes to address soft tissue restrictions and kinesiotape to improve patella tracking.  Added stretches to improve mobility for pain control with therex focus on VMO strengthening to improve patella tracking.  Reports of some relief with tape, continues to be limited by pain through session.  Educated places to purchase tape and encouraged to purchase and bring into next session for education on self care.  Reports pain scale 7/10.      Rehab Potential  Good    Clinical Impairments Affecting Rehab Potential  Pain, ROM, strength, balance, edema    PT Frequency  2x / week    PT Duration  8 weeks    PT Treatment/Interventions  ADLs/Self Care Home Management;Cryotherapy;Gait training;Stair training;Functional mobility training;Therapeutic activities;Therapeutic exercise;Balance training;Neuromuscular re-education;Patient/family education;Manual techniques;Passive range of motion    PT Next Visit Plan  Reassess in 2 more session prior MD apt.  Continue lateral patella tracking techniques to assist, educate self care if purchased.  Continue quad/hip strengthening and advanced functional strengthening.  Progress to theraband for sidestepping and addto HEP.      PT Home Exercise Plan  Ankle pumps 2x10; heel slides 2x10 both 2 times daily; 12/13  - bridge with band, clamshell, SAQ       Patient will benefit from skilled therapeutic intervention in order to improve the following deficits and impairments:  Abnormal gait, Decreased endurance, Hypomobility, Increased edema, Decreased activity tolerance, Decreased strength, Increased fascial restricitons, Pain, Decreased balance, Decreased mobility, Difficulty walking, Decreased range of motion, Improper body mechanics, Decreased coordination, Impaired flexibility, Postural dysfunction  Visit Diagnosis: Impaired functional mobility, balance, gait, and endurance  Decreased range of motion (ROM) of right knee  Decreased strength involving knee joint     Problem List Patient Active Problem List   Diagnosis Date Noted  . GERD without esophagitis 03/28/2017  . Obesity (BMI 30-39.9) 03/28/2017  . Obesity 10/06/2016  . Lumbar foraminal stenosis 06/04/2016  . Carpal tunnel syndrome, bilateral 02/07/2014  . S/P lumbar spinal fusion 02/07/2014  . Obstructive sleep  apnea 02/04/2014  . Hand arthritis 10/15/2013  . HTN (hypertension), benign 08/24/2013  . Hypertriglyceridemia 08/24/2013  . Iliotibial band tendonitis 02/08/2013  . Trigger finger, acquired 02/08/2013  . Low back pain 06/22/2011  . HNP (herniated nucleus pulposus) 06/22/2011  . HIP PAIN, RIGHT 12/15/2010  . LUMBOSACRAL SPONDYLOSIS WITHOUT MYELOPATHY 12/15/2010  . SPINAL STENOSIS, LUMBAR 12/15/2010  . KNEE REPLACEMENT, HX OF 02/26/2010  . DERANGEMENT MENISCUS 07/03/2009  . KNEE, ARTHRITIS, DEGEN./OSTEO 05/22/2009  . KNEE PAIN 05/22/2009  . ANSERINE BURSITIS 05/22/2009  . TRIGGER FINGER 09/23/2008   Ihor Austin, [place]; [place] Aldona Lento 12/02/2017, 12:24 PM  [place] [address] [place], [place], [address] Phone: [phone]   Fax:  [phone]  Name: Emily [name] MRN: [id number] Date of Birth: 05-Mar-1951

## 2017-12-05 ENCOUNTER — Ambulatory Visit (HOSPITAL_COMMUNITY): Payer: PPO

## 2017-12-05 ENCOUNTER — Other Ambulatory Visit: Payer: Self-pay

## 2017-12-05 ENCOUNTER — Encounter (HOSPITAL_COMMUNITY): Payer: Self-pay

## 2017-12-05 DIAGNOSIS — Z7409 Other reduced mobility: Secondary | ICD-10-CM

## 2017-12-05 DIAGNOSIS — R29898 Other symptoms and signs involving the musculoskeletal system: Secondary | ICD-10-CM

## 2017-12-05 DIAGNOSIS — M25661 Stiffness of right knee, not elsewhere classified: Secondary | ICD-10-CM

## 2017-12-05 NOTE — Patient Instructions (Signed)
    HAMSTRING STRETCH WITH TOWEL: 3 repetitions for 30-60 seconds  While lying down on your back, hook a towel or strap under  your foot and draw up your leg until a stretch is felt under your leg. calf area.  Keep your knee in a straightened position during the stretch.

## 2017-12-05 NOTE — Therapy (Signed)
[place], [place], [address] Phone: [phone]   Fax:  [phone]  Physical Therapy Treatment  Patient Details  Name: Emily [name] MRN: [id number] Date of Birth: 29-Jun-1951 Referring Provider: Arther Abbott, MD   Encounter Date: 12/05/2017  PT End of Session - 12/05/17 0927    Visit Number  7    Number of Visits  17    Date for PT Re-Evaluation  12/13/17    Authorization Type  [employer]    Authorization Time Period  11/15/2017- 01/16/2018     Authorization - Visit Number  7    Authorization - Number of Visits  10    PT Start Time  0907    PT Stop Time  0946    PT Time Calculation (min)  39 min    Activity Tolerance  Patient tolerated treatment well    Behavior During Therapy  [facility] for tasks assessed/performed       Past Medical History:  Diagnosis Date  . Acid reflux   . Anxiety   . CHF (congestive heart failure) (HCC)    90's swelling body-no breathing problems at that time-no cardiac -in dr s Wolfgang [place] office [place] give lasix and potassium  . Complication of anesthesia    History of being told she was allergic to something in 1980 no problems since  . Depression   . Fibromyalgia   . History of blood transfusion reaction 1978  . History of hiatal hernia   . HNP (herniated nucleus pulposus)   . Hypertension    Does not see a cardiologist no stress or echo  . Joint pain   . Nerve pain    right leg   . Ruptured lumbar disc   . Sleep apnea    Uses CPAP every night setting at 7 mm/Hg    Past Surgical History:  Procedure Laterality Date  . BACK SURGERY     x2  . BLADDER REPAIR     tear during during birth of child  . CARPAL TUNNEL RELEASE Bilateral   . CESAREAN SECTION    . CHOLECYSTECTOMY     gallbladder disease; stayed sick  . COLONOSCOPY N/A 04/28/2016   Procedure: COLONOSCOPY;  Surgeon: [name], MD;  [facility];  Service: Endoscopy;  Laterality:  N/A;  . ESOPHAGOGASTRODUODENOSCOPY N/A 04/28/2016   Procedure: ESOPHAGOGASTRODUODENOSCOPY (EGD);  Surgeon: [name], MD;  [facility];  Service: Endoscopy;  Laterality: N/A;  12:00  . GALLBLADDER SURGERY    . knee right (torn Cart)    . REPLACEMENT TOTAL KNEE     left  . ROTATOR CUFF REPAIR     right  . SPINAL FUSION    . TUBAL LIGATION    . vein right leg removed    . wrist ganglion cyst left      There were no vitals filed for this visit.  Subjective Assessment - 12/05/17 5427    Subjective  Patient reports she has not had her knee give way on her since 12/27 and that she has been continuign her HEP daily. She states most of her knee pain today is in her medial hamstring behind her knee.    Pertinent History  Osteoarthritis moderate in Right knee; TKA in Left knee    Patient Stated Goals  being able to sit and walk without it being painful; to be able to squat or bend over; bring leg up to tie shoes  Currently in Pain?  Yes    Pain Score  5     Pain Location  Knee    Pain Orientation  Right    Pain Descriptors / Indicators  Aching;Sore;Tender    Pain Radiating Towards  in posterior knee along hamstring    Pain Onset  More than a month ago    Pain Frequency  Constant    Aggravating Factors   prolonged walking, standing, or sitting, painful to get up from sitting for prolonged time    Pain Relieving Factors  tylenol    Effect of Pain on Daily Activities  limited in standing and walking    Multiple Pain Sites  No       [facility] Adult PT Treatment/Exercise - 12/05/17 0001      Knee/Hip Exercises: Stretches   Active Hamstring Stretch  3 reps;30 seconds;Limitations    Active Hamstring Stretch Limitations  supine with rope      Knee/Hip Exercises: Supine   Short Arc Quad Sets  20 reps    Short Arc Quad Sets Limitations  3 lbs    Bridges  20 reps    Bridges Limitations  ball squeeze for [employer]    Straight Leg Raises  Right;20 reps;Limitations    Straight Leg  Raises Limitations  3 lbs from initation of SAQ      Manual Therapy   Manual Therapy  Taping;Soft tissue mobilization    Manual therapy comments  performed seperate from other services    Soft tissue mobilization  STM to medial and lateral hamstring,  and distal ITB on RLE    Kinesiotex  Facilitate Muscle      Kinesiotix   Facilitate Muscle   Patella tracking, latearl to medial pull, band split to wrap proximal and distal boarder of patella educated on self-application throughout        PT Education - 12/05/17 0926    Education provided  Yes    Education Details  Educated on technique for K Tape to facilitate patellar tracking, deatails on type of tape, how to cut/prepare the tape, application of tension on KTape for proper faciliation. Educated on proper exercise technique throughout session    Person(s) Educated  Patient    Methods  Explanation    Comprehension  Verbalized understanding;Need further instruction       PT Short Term Goals - 11/15/17 1654      PT SHORT TERM GOAL #1   Title  Patient will report understandign and regular compliance with HEP.     Baseline  Patient educated about HEP.     Time  4    Period  Weeks    Status  New    Target Date  12/13/17      PT SHORT TERM GOAL #2   Title  Patient will demonstrate improve right knee strength of 1/2 MMT grade in all limited planes in order to improve functional mobility.     Baseline  Limited by 3-/5 in knee flexion/extension.    Time  4    Period  Weeks    Status  New    Target Date  12/13/17      PT SHORT TERM GOAL #3   Title  Patient will demonstrate ability to perform single limb stance on right lower extremity for 5 seconds in order to assist with stair ambulation.     Baseline  Patient maintians for 2 seconds on right LE.     Time  4  Period  Weeks    Status  New    Target Date  12/13/17        PT Long Term Goals - 11/15/17 1657      PT LONG TERM GOAL #1   Title  Patient will demonstrate  improved knee strength of 1 MMT in order to assist with functional mobility.     Baseline  Knee flexion extension on right is 3-/5    Time  8    Period  Weeks    Status  New    Target Date  01/10/18      PT LONG TERM GOAL #2   Title  Patient will demonstrate ability to perform TUG in 13 seconds indicating an improvement dynamic balance.      Baseline  Patient performs TUG in 23 seconds.     Time  8    Period  Weeks    Status  New    Target Date  01/10/18      PT LONG TERM GOAL #3   Title  Patient will demonstrate ability to perform single limb stance for 10 seconds in order to assist with static balance and navigating the environment.     Baseline  Patient maintains right SLS for 2 seconds.     Time  8    Period  Weeks    Status  New    Target Date  01/10/18        Plan - 12/05/17 0981    Clinical Impression Statement  Patient progressing in therapy and continues to report benefits with use of tape. Today's session was focused on strengthening/activation of quadriceps muscle and education on self-application of tapping technique. Patient purchased a bandage tape rather than "kinesiotape" and was educated on the difference between the two for use at home. She continues to be limited by pain along right medial hamstring tendon and STW was performed at the start of session to decrease pain. She will continue to benefit from skilled PT services to address impairments and progress towards goals, decrease pain, and improve QOL.    Rehab Potential  Good    Clinical Impairments Affecting Rehab Potential  Pain, ROM, strength, balance, edema    PT Frequency  2x / week    PT Duration  8 weeks    PT Treatment/Interventions  ADLs/Self Care Home Management;Cryotherapy;Gait training;Stair training;Functional mobility training;Therapeutic activities;Therapeutic exercise;Balance training;Neuromuscular re-education;Patient/family education;Manual techniques;Passive range of motion    PT Next Visit  Plan  Re-assess next session before MD appt on Monday 12/12/17.  Continue lateral patella tracking techniques to assist, educate self care if purchased.  Continue quad/hip strengthening and advanced functional strengthening.  Progress to theraband for sidestepping and addto HEP.  Continue with STW to hamstrings and ITB PRN, and faciliate VMO activation qiht quad functioning.    PT Home Exercise Plan  Ankle pumps 2x10; heel slides 2x10 both 2 times daily; 12/13 - bridge with band, clamshell, SAQ; 12/31 - hamstring stretch active;     Consulted and Agree with Plan of Care  Patient       Patient will benefit from skilled therapeutic intervention in order to improve the following deficits and impairments:  Abnormal gait, Decreased endurance, Hypomobility, Increased edema, Decreased activity tolerance, Decreased strength, Increased fascial restricitons, Pain, Decreased balance, Decreased mobility, Difficulty walking, Decreased range of motion, Improper body mechanics, Decreased coordination, Impaired flexibility, Postural dysfunction  Visit Diagnosis: Impaired functional mobility, balance, gait, and endurance  Decreased range of motion (ROM) of right  knee  Decreased strength involving knee joint     Problem List Patient Active Problem List   Diagnosis Date Noted  . GERD without esophagitis 03/28/2017  . Obesity (BMI 30-39.9) 03/28/2017  . Obesity 10/06/2016  . Lumbar foraminal stenosis 06/04/2016  . Carpal tunnel syndrome, bilateral 02/07/2014  . S/P lumbar spinal fusion 02/07/2014  . Obstructive sleep apnea 02/04/2014  . Hand arthritis 10/15/2013  . HTN (hypertension), benign 08/24/2013  . Hypertriglyceridemia 08/24/2013  . Iliotibial band tendonitis 02/08/2013  . Trigger finger, acquired 02/08/2013  . Low back pain 06/22/2011  . HNP (herniated nucleus pulposus) 06/22/2011  . HIP PAIN, RIGHT 12/15/2010  . LUMBOSACRAL SPONDYLOSIS WITHOUT MYELOPATHY 12/15/2010  . SPINAL STENOSIS, LUMBAR  12/15/2010  . KNEE REPLACEMENT, HX OF 02/26/2010  . DERANGEMENT MENISCUS 07/03/2009  . KNEE, ARTHRITIS, DEGEN./OSTEO 05/22/2009  . KNEE PAIN 05/22/2009  . ANSERINE BURSITIS 05/22/2009  . TRIGGER FINGER 09/23/2008    Kipp Brood, PT, DPT Physical Therapist with [facility]  12/05/2017 11:02 AM    [place], [place], [address] Phone: [phone]   Fax:  [phone]  Name: JANAN BOGIE MRN: [id number] Date of Birth: Feb 24, 1951

## 2017-12-08 ENCOUNTER — Other Ambulatory Visit: Payer: Self-pay

## 2017-12-08 ENCOUNTER — Encounter (HOSPITAL_COMMUNITY): Payer: Self-pay

## 2017-12-08 ENCOUNTER — Ambulatory Visit (HOSPITAL_COMMUNITY): Payer: PPO | Attending: Family Medicine

## 2017-12-08 DIAGNOSIS — R29898 Other symptoms and signs involving the musculoskeletal system: Secondary | ICD-10-CM | POA: Insufficient documentation

## 2017-12-08 DIAGNOSIS — Z7409 Other reduced mobility: Secondary | ICD-10-CM | POA: Diagnosis not present

## 2017-12-08 DIAGNOSIS — M25661 Stiffness of right knee, not elsewhere classified: Secondary | ICD-10-CM | POA: Diagnosis not present

## 2017-12-08 NOTE — Therapy (Addendum)
[place], [place], [address] Phone: [phone]   Fax:  [phone]  Physical Therapy Treatment/Discharge Summary  Patient Details  Name: Emily [name] MRN: [id number] Date of Birth: 1951-09-13 Referring Provider: Adonis Huguenin, MD   PHYSICAL THERAPY DISCHARGE SUMMARY  Visits from Start of Care: 8  Current functional level related to goals / functional outcomes: Re-assessment was performed 12/08/17 and the patient has met 3/3 short term goals, and 2/3 long term goals. She has demonstrated significant improvements in ROM and strength for her RLE however remains limited by pain during functional mobility. A new goal was created for performance during stair ambulation to have decreased pain and improve balance/mobility during functional activity. Therapy shoul now focus on pain alleviation along hamstring, quad, and patella tendons as well as stability of right patella during weight bearing activities.     Remaining deficits: See details below.  Remaining goals to be met: PT LONG TERM GOAL #1  Title  Patient will demonstrate improved knee strength of 1 MMT in order to assist with functional mobility.    Baseline  Knee flexion extension on right is 3-/5; 12/08/17 - improved by 1 grade for all groups tested with exception to right hip abduction/adduction which is still 4+/5   Time  8   Period  Weeks   Status  Partially Met   Target Date  01/10/18      Education / Equipment: Educated on progress and current POC. Educated on goals met so far and remaining deficits. Educated on proper exercise form throughout.   Plan: Patient agrees to discharge.  Patient goals were partially met. Patient is being discharged due to the patient's request.  ?????    Patient called to cancel further appointments reporting she has knee surgery scheduled for 12/29/17.  Kipp Brood, PT, DPT Physical Therapist with [facility]  12/13/2017 10:35 AM      Encounter Date: 12/08/2017   ROM:  Flexion: 118 degress            Extension: 1 degree   PT End of Session - 12/08/17 0908    Visit Number  8    Number of Visits  17    Date for PT Re-Evaluation  12/13/17    Authorization Type  [employer]    Authorization Time Period  11/15/2017- 01/16/2018     PT Start Time  0908    PT Stop Time  0952    PT Time Calculation (min)  44 min    Activity Tolerance  Patient tolerated treatment well    Behavior During Therapy  [facility] for tasks assessed/performed       Past Medical History:  Diagnosis Date  . Acid reflux   . Anxiety   . CHF (congestive heart failure) (HCC)    90's swelling body-no breathing problems at that time-no cardiac -in dr s Wolfgang [place] office [place] give lasix and potassium  . Complication of anesthesia    History of being told she was allergic to something in 1980 no problems since  . Depression   . Fibromyalgia   . History of blood transfusion reaction 1978  . History of hiatal hernia   . HNP (herniated nucleus pulposus)   . Hypertension    Does not see a cardiologist no stress or echo  . Joint pain   . Nerve pain    right leg   . Ruptured lumbar disc   . Sleep apnea  Uses CPAP every night setting at 7 mm/Hg    Past Surgical History:  Procedure Laterality Date  . BACK SURGERY     x2  . BLADDER REPAIR     tear during during birth of child  . CARPAL TUNNEL RELEASE Bilateral   . CESAREAN SECTION    . CHOLECYSTECTOMY     gallbladder disease; stayed sick  . COLONOSCOPY N/A 04/28/2016   Procedure: COLONOSCOPY;  Surgeon: [name], MD;  [facility]: [facility];  Service: Endoscopy;  Laterality: N/A;  . ESOPHAGOGASTRODUODENOSCOPY N/A 04/28/2016   Procedure: ESOPHAGOGASTRODUODENOSCOPY (EGD);  Surgeon: [name], MD;  Location: [facility];  Service: Endoscopy;  Laterality: N/A;  12:00  . GALLBLADDER SURGERY    . knee right (torn Cart)     . REPLACEMENT TOTAL KNEE     left  . ROTATOR CUFF REPAIR     right  . SPINAL FUSION    . TUBAL LIGATION    . vein right leg removed    . wrist ganglion cyst left      There were no vitals filed for this visit.  Subjective Assessment - 12/08/17 1140    Subjective  Patient reports she is doing well today. Still having pain in back of knee and more on the anerolateral side of her right knee. She has been continuing with her HEP daily and is icing as needed for 15 minutes throughout the day.    Pertinent History  Osteoarthritis moderate in Right knee; TKA in Left knee    Limitations  Sitting;Standing;Walking;House hold activities;Lifting    Diagnostic tests  x-rays: demonstrate right knee osteoarthritis    Patient Stated Goals  being able to sit and walk without it being painful; to be able to squat or bend over; bring leg up to tie shoes    Currently in Pain?  Yes    Pain Score  4     Pain Location  Knee    Pain Orientation  Right    Pain Descriptors / Indicators  Aching;Sore    Pain Type  Chronic pain    Pain Radiating Towards  around entir knee, bilatearl hamstring tendons and anterolateral knee    Pain Onset  More than a month ago    Pain Frequency  Constant    Aggravating Factors   walking, standing, and sitting for long periods    Pain Relieving Factors  tylenol, ice    Effect of Pain on Daily Activities  limits walking, standing, and stairs    Multiple Pain Sites  No         [facility] PT Assessment - 12/08/17 0001      Assessment   Medical Diagnosis  Patella subluxation, knee OA     Referring Provider  Adonis Huguenin, MD    Onset Date/Surgical Date  11/15/17    Next MD Visit  12/12/17    Prior Therapy  After Left total knee replacement      Balance Screen   Has the patient fallen in the past 6 months  No    Has the patient had a decrease in activity level because of a fear of falling?   No    Is the patient reluctant to leave their home because of a fear of falling?    No      AROM   Right Knee Extension  1    Right Knee Flexion  118      Strength   Right Hip Flexion  4/5 pain with resistance    Right Hip ABduction  4+/5    Right Hip ADduction  4+/5    Left Hip Flexion  5/5    Left Hip ABduction  5/5    Left Hip ADduction  5/5    Right Knee Flexion  4-/5 pain with resistance    Right Knee Extension  4-/5 pain with resistance    Left Knee Flexion  5/5    Left Knee Extension  5/5    Right Ankle Dorsiflexion  5/5    Right Ankle Plantar Flexion  5/5    Left Ankle Dorsiflexion  5/5    Left Ankle Plantar Flexion  5/5      Flexibility   Hamstrings  mild limitation on left LE, moderate limitaiton on right LE      Palpation   Palpation comment  tenderness to palpation along bilateral hamstring tendons and quad/patellar tendons      Transfers   Five time sit to stand comments   27 seconds due to pain anterolateral aspect of right knee patient slow and antalgic      Ambulation/Gait   Gait Pattern  Step-through pattern;Decreased hip/knee flexion - right;Antalgic      Static Standing Balance   Static Standing Balance -  Activities   Single Leg Stance - Right Leg;Single Leg Stance - Left Leg    Static Standing - Comment/# of Minutes  LLE: 12 seconds; RLE: 5 seconds      Timed Up and Go Test   TUG  Normal TUG    Normal TUG (seconds)  12.4    TUG Comments  The norm for patient's age is 8.1 seconds curretn time indicate decreased fall risk however still slow       [facility] Adult PT Treatment/Exercise - 12/08/17 0001      Knee/Hip Exercises: Supine   Bridges  20 reps    Bridges Limitations  ball squeeze for VMO    Straight Leg Raises  Right;20 reps;Limitations    Straight Leg Raises Limitations  3 lbs from initation of SAQ      Manual Therapy   Manual Therapy  Taping;Soft tissue mobilization    Manual therapy comments  performed seperate from other services    Soft tissue mobilization  STM to medial and lateral hamstring    Kinesiotex  Facilitate  Muscle      Kinesiotix   Facilitate Muscle   Patella tracking, lateral to medial pull, band split to wrap proximal and distal boarder of patella        PT Education - 12/08/17 1149    Education provided  Yes    Education Details  Educated on progress and current POC. Educated on goals met so far and remaining deficits. Educated on proper exercise form throughout.    Person(s) Educated  Patient    Methods  Explanation    Comprehension  Verbalized understanding       PT Short Term Goals - 12/08/17 0930      PT SHORT TERM GOAL #1   Title  Patient will report understandign and regular compliance with HEP.     Baseline  Patient educated about HEP. 12/08/17 - patient consisitantly reports particiaption    Time  4    Period  Weeks    Status  Achieved      PT SHORT TERM GOAL #2   Title  Patient will demonstrate improve right knee strength of 1/2 MMT grade in all limited planes in order to  improve functional mobility.     Baseline  Limited by 3-/5 in knee flexion/extension. 12/08/17 - improved by 1 grade for all groups tested with exception to right hip abduction/adduction which is still 4+/5    Time  4    Period  Weeks    Status  Achieved      PT SHORT TERM GOAL #3   Title  Patient will demonstrate ability to perform single limb stance on right lower extremity for 5 seconds in order to assist with stair ambulation.     Baseline  Patient maintians for 2 seconds on right LE. 12/08/17 - RLE 12 seconds, LLE: 25 seconds    Time  4    Period  Weeks    Status  Achieved        PT Long Term Goals - 12/08/17 0935      PT LONG TERM GOAL #1   Title  Patient will demonstrate improved knee strength of 1 MMT in order to assist with functional mobility.     Baseline  Knee flexion extension on right is 3-/5; 12/08/17 - improved by 1 grade for all groups tested with exception to right hip abduction/adduction which is still 4+/5    Time  8    Period  Weeks    Status  Partially Met    Target Date   01/10/18      PT LONG TERM GOAL #2   Title  Patient will demonstrate ability to perform TUG in 13 seconds indicating an improvement dynamic balance.      Baseline  Patient performs TUG in 23 seconds. 12/08/17 - TUG 12.4 seconds     Time  8    Period  Weeks    Status  Achieved      PT LONG TERM GOAL #3   Title  Patient will demonstrate ability to perform single limb stance for 10 seconds in order to assist with static balance and navigating the environment.     Baseline  Patient maintains right SLS for 2 seconds. 12/08/17 - REL 12 seconds, LLE: 25 seconds    Time  8    Period  Weeks    Status  Achieved      PT LONG TERM GOAL #4   Title  Patient will be able to ascend/descend 12x 6" stairs with 1 hand rail and step over step pattern with no increase in pain to improve functional mobility and independence to ascend/descend a flight of stairs.    Time  5    Period  Weeks    Status  New    Target Date  01/10/18       Plan - 12/08/17 1151    Clinical Impression Statement  Re-assessment was performed today and the patient has met 3/3 short term goals, and 2/3 long term goals. She has demonstrated significant improvements in ROM and strength for her RLE however remains limited by pain during functional mobility. A new goal was created for performance during stair ambulation to have decreased pain and improve balance/mobility during functional activity. Therapy shoul now focus on pain alleviation along hamstring, quad, and patella tendons as well as stability of right patella during weight bearing activities. She will continue to benefit from skilled PT services to address impairments and progress towards goals, decrease pain, and improve QOL.     Rehab Potential  Good    Clinical Impairments Affecting Rehab Potential  Pain, ROM, strength, balance, edema    PT Frequency  2x /  week    PT Duration  8 weeks    PT Treatment/Interventions  ADLs/Self Care Home Management;Cryotherapy;Gait training;Stair  training;Functional mobility training;Therapeutic activities;Therapeutic exercise;Balance training;Neuromuscular re-education;Patient/family education;Manual techniques;Passive range of motion    PT Next Visit Plan  Continue lateral patella tracking techniques to assist, educate self care if purchased.  Continue quad/hip strengthening and advanced functional strengthening.  re-introduce theraband for sidestepping and add to HEP.  Continue with STW to hamstrings and ITB PRN, and faciliate VMO activation qiht quad functioning. Focus on pain reduction around patella/quad and hamstring tendons.    PT Home Exercise Plan  Ankle pumps 2x10; heel slides 2x10 both 2 times daily; 12/13 - bridge with band, clamshell, SAQ; 12/31 - hamstring stretch active;     Consulted and Agree with Plan of Care  Patient       Patient will benefit from skilled therapeutic intervention in order to improve the following deficits and impairments:  Abnormal gait, Decreased endurance, Hypomobility, Increased edema, Decreased activity tolerance, Decreased strength, Increased fascial restricitons, Pain, Decreased balance, Decreased mobility, Difficulty walking, Decreased range of motion, Improper body mechanics, Decreased coordination, Impaired flexibility, Postural dysfunction  Visit Diagnosis: Impaired functional mobility, balance, gait, and endurance  Decreased range of motion (ROM) of right knee  Decreased strength involving knee joint     Problem List Patient Active Problem List   Diagnosis Date Noted  . GERD without esophagitis 03/28/2017  . Obesity (BMI 30-39.9) 03/28/2017  . Obesity 10/06/2016  . Lumbar foraminal stenosis 06/04/2016  . Carpal tunnel syndrome, bilateral 02/07/2014  . S/P lumbar spinal fusion 02/07/2014  . Obstructive sleep apnea 02/04/2014  . Hand arthritis 10/15/2013  . HTN (hypertension), benign 08/24/2013  . Hypertriglyceridemia 08/24/2013  . Iliotibial band tendonitis 02/08/2013  . Trigger  finger, acquired 02/08/2013  . Low back pain 06/22/2011  . HNP (herniated nucleus pulposus) 06/22/2011  . HIP PAIN, RIGHT 12/15/2010  . LUMBOSACRAL SPONDYLOSIS WITHOUT MYELOPATHY 12/15/2010  . SPINAL STENOSIS, LUMBAR 12/15/2010  . KNEE REPLACEMENT, HX OF 02/26/2010  . DERANGEMENT MENISCUS 07/03/2009  . KNEE, ARTHRITIS, DEGEN./OSTEO 05/22/2009  . KNEE PAIN 05/22/2009  . ANSERINE BURSITIS 05/22/2009  . TRIGGER FINGER 09/23/2008    Kipp Brood, PT, DPT Physical Therapist with [facility]  12/08/2017 12:03 PM    [place] [address] [place], [place], [address] Phone: [phone]   Fax:  [phone]  Name: Emily [name] MRN: [id number] Date of Birth: November 17, 1951

## 2017-12-12 ENCOUNTER — Telehealth: Payer: Self-pay | Admitting: Radiology

## 2017-12-12 ENCOUNTER — Ambulatory Visit: Payer: PPO | Admitting: Orthopedic Surgery

## 2017-12-12 ENCOUNTER — Encounter: Payer: Self-pay | Admitting: Orthopedic Surgery

## 2017-12-12 VITALS — BP 147/91 | HR 83 | Ht 62.0 in | Wt 176.0 lb

## 2017-12-12 DIAGNOSIS — Z9889 Other specified postprocedural states: Secondary | ICD-10-CM

## 2017-12-12 DIAGNOSIS — M1711 Unilateral primary osteoarthritis, right knee: Secondary | ICD-10-CM | POA: Diagnosis not present

## 2017-12-12 DIAGNOSIS — Z96652 Presence of left artificial knee joint: Secondary | ICD-10-CM | POA: Diagnosis not present

## 2017-12-12 NOTE — Telephone Encounter (Signed)
I  Have called patient to advise surgery for her Knee replacement will be on Jan 24th, expect a call from pre op at [facility]   They will let her know a definite time, but it will be very early in the am.  No answer will try again

## 2017-12-12 NOTE — Patient Instructions (Signed)
You have decided to proceed with knee replacement surgery. You have decided not to continue with nonoperative measures such as but not limited to oral medication, weight loss, activity modification, physical therapy, bracing, or injection.  We will perform the procedure commonly known as total knee replacement. Some of the risks associated with knee replacement surgery include but are not limited to Bleeding Infection Swelling Stiffness Blood clot Pain that persists even after surgery  Infection is especially devastating complication of knee surgery although rare. If infection does occur your implant will usually have to be removed and several surgeries and antibiotics will be needed to eradicate the infection prior to performing a repeat replacement.   In some cases amputation is required to eradicate the infection. In other rare cases a knee fusion is needed    If you're not comfortable with these risks and would like to continue with nonoperative treatment please let Dr. Josph Norfleet know prior to your surgery.  

## 2017-12-12 NOTE — Progress Notes (Signed)
Progress Note   Patient ID: Emily [name], female   DOB: [birth year], 67 y.o.   MRN: [id number]  Chief Complaint  Patient presents with  . Knee Pain    right     67 year old female status post lumbar fusion status post left total knee presents for evaluation of her right knee.  She complains of dull constant severe dull aching pain lateral joint line entire knee posterior knee medial hamstring for several years now unrelieved by physical therapy.  She also complains that the patella is slightly red and out of place and she feels like she might have something in the joint.  She complains of the right leg gives way     Review of Systems  Constitutional: Negative.   Respiratory: Negative.   Cardiovascular: Negative.   Musculoskeletal: Positive for back pain.  Skin: Negative.   Neurological: Negative for tingling, sensory change and focal weakness.   Current Meds  Medication Sig  . acetaminophen (TYLENOL) 500 MG tablet Take 1,000 mg by mouth 3 (three) times daily as needed for moderate pain.   Marland Kitchen ALPRAZolam (XANAX) 0.5 MG tablet 1/2-1 BID prn for anxiety,caution drowsiness.  . diazepam (VALIUM) 5 MG tablet Take 5 mg by mouth every 6 (six) hours as needed for anxiety.  . DULoxetine (CYMBALTA) 60 MG capsule Take 1 capsule (60 mg total) by mouth daily.  Marland Kitchen Fexofenadine HCl (ALLEGRA PO) Take 1 tablet by mouth daily as needed (allergies).   . hydrochlorothiazide (HYDRODIURIL) 25 MG tablet Take 1 tablet (25 mg total) by mouth daily.  . pantoprazole (PROTONIX) 40 MG tablet Take 1 tablet (40 mg total) by mouth daily.  . potassium chloride SA (KLOR-CON M20) 20 MEQ tablet Take 1 tablet (20 mEq total) by mouth daily.  . pravastatin (PRAVACHOL) 20 MG tablet Take 1 tablet (20 mg total) by mouth daily.  . verapamil (CALAN-SR) 180 MG CR tablet Take 1 tablet (180 mg total) by mouth daily.    Past Medical History:  Diagnosis Date  . Acid reflux   . Anxiety   . CHF (congestive heart failure) (HCC)     90's swelling body-no breathing problems at that time-no cardiac -in dr s Wolfgang [place] office [place] give lasix and potassium  . Complication of anesthesia    History of being told she was allergic to something in 1980 no problems since  . Depression   . Fibromyalgia   . History of blood transfusion reaction 1978  . History of hiatal hernia   . HNP (herniated nucleus pulposus)   . Hypertension    Does not see a cardiologist no stress or echo  . Joint pain   . Nerve pain    right leg   . Ruptured lumbar disc   . Sleep apnea    Uses CPAP every night setting at 7 mm/Hg     Allergies  Allergen Reactions  . Codeine Itching  . Oxycodone-Acetaminophen Itching  . Sulfamethoxazole-Trimethoprim Hives  . Celebrex [Celecoxib] Other (See Comments)    Homicidal thoughts.  . Hydrocodone-Acetaminophen Itching    Takes with benadryl  . Losartan     Skin on arms got loose  . Lyrica [Pregabalin]     When combined with narcotics will cause SpO2 to drop  . Tramadol Itching    BP (!) 147/91   Pulse 83   Ht 5\' 2"  (1.575 m)   Wt 176 lb (79.8 kg)   BMI 32.19 kg/m    Physical Exam  Constitutional: She is oriented  to person, place, and time. She appears well-developed and well-nourished. No distress.  Musculoskeletal:       Legs: Neurological: She is alert and oriented to person, place, and time. She displays normal reflexes. No sensory deficit. She exhibits normal muscle tone. Coordination normal.  Skin: Skin is warm and dry. Capillary refill takes less than 2 seconds. She is not diaphoretic.  Psychiatric: She has a normal mood and affect.    Ortho Exam   Medical decision-making  Imaging: X-ray was done on July 2018 she had lateral compartment and medial compartment joint space narrowing with the lateral compartment worse.  We also saw subchondral sclerosis of the lateral plateau with effusion.  Tibiofemoral alignment was valgus 5-7 degrees  Encounter Diagnoses  Name Primary?  Marland Kitchen  Arthritis of knee, right Yes  . History of lumbar surgery   . History of total left knee replacement     After discussion the patient agrees that there is enough pain for her to warrant knee replacement surgery she does not want to participate in any other nonoperative treatment protocols  Patient is ready to have a right total knee arthroplasty scheduled for January 24  The procedure has been fully reviewed with the patient; The risks and benefits of surgery have been discussed and explained and understood. Alternative treatment has also been reviewed, questions were encouraged and answered. The postoperative plan is also been reviewed.    Arther Abbott, MD 12/12/2017 2:40 PM

## 2017-12-13 ENCOUNTER — Ambulatory Visit (HOSPITAL_COMMUNITY): Payer: PPO

## 2017-12-13 ENCOUNTER — Telehealth: Payer: Self-pay | Admitting: Radiology

## 2017-12-13 ENCOUNTER — Ambulatory Visit: Payer: PPO | Admitting: Nutrition

## 2017-12-13 NOTE — Telephone Encounter (Signed)
No show, called and spoke to pt who stated she has knee surgery planned for January 24th or 29th following further examination from MD.  Further apts were cancelled due to new findings per request.    Ihor Austin, [place]; CBIS [phone]

## 2017-12-13 NOTE — Telephone Encounter (Signed)
I called patient about surgery time again. She has gotten a call about the preop / States Jan 24th is good for the surgery.

## 2017-12-13 NOTE — Telephone Encounter (Signed)
I have faxed information to [employer] to get authorization for her TKR

## 2017-12-15 ENCOUNTER — Encounter (HOSPITAL_COMMUNITY): Payer: PPO

## 2017-12-16 ENCOUNTER — Other Ambulatory Visit (HOSPITAL_BASED_OUTPATIENT_CLINIC_OR_DEPARTMENT_OTHER): Payer: Self-pay

## 2017-12-16 DIAGNOSIS — G4730 Sleep apnea, unspecified: Secondary | ICD-10-CM

## 2017-12-16 DIAGNOSIS — R5383 Other fatigue: Secondary | ICD-10-CM

## 2017-12-16 DIAGNOSIS — G4710 Hypersomnia, unspecified: Secondary | ICD-10-CM

## 2017-12-16 DIAGNOSIS — R0683 Snoring: Secondary | ICD-10-CM

## 2017-12-19 ENCOUNTER — Other Ambulatory Visit: Payer: Self-pay | Admitting: Orthopedic Surgery

## 2017-12-19 DIAGNOSIS — M1711 Unilateral primary osteoarthritis, right knee: Secondary | ICD-10-CM

## 2017-12-20 ENCOUNTER — Encounter (HOSPITAL_COMMUNITY): Payer: PPO

## 2017-12-21 ENCOUNTER — Ambulatory Visit: Payer: PPO | Attending: Family Medicine | Admitting: Neurology

## 2017-12-21 DIAGNOSIS — G4733 Obstructive sleep apnea (adult) (pediatric): Secondary | ICD-10-CM | POA: Diagnosis not present

## 2017-12-21 DIAGNOSIS — Z79899 Other long term (current) drug therapy: Secondary | ICD-10-CM | POA: Insufficient documentation

## 2017-12-21 DIAGNOSIS — G4710 Hypersomnia, unspecified: Secondary | ICD-10-CM | POA: Diagnosis not present

## 2017-12-21 DIAGNOSIS — R5383 Other fatigue: Secondary | ICD-10-CM | POA: Diagnosis not present

## 2017-12-21 DIAGNOSIS — R0683 Snoring: Secondary | ICD-10-CM

## 2017-12-21 DIAGNOSIS — G4730 Sleep apnea, unspecified: Secondary | ICD-10-CM

## 2017-12-21 NOTE — Patient Instructions (Signed)
Emily [name]  12/21/2017     @PREFPERIOPPHARMACY @   Your procedure is scheduled on  12/29/2017   Report to [facility] at  615  A.M.  Call this number if you have problems the morning of surgery:  [phone]   Remember:  Do not eat food or drink liquids after midnight.  Take these medicines the morning of surgery with A SIP OF WATER  Xanax or valium, cymbalta, allegra, protonix, verapamil.   Do not wear jewelry, make-up or nail polish.  Do not wear lotions, powders, or perfumes, or deodorant.  Do not shave 48 hours prior to surgery.  Men may shave face and neck.  Do not bring valuables to the hospital.  [facility] is not responsible for any belongings or valuables.  Contacts, dentures or bridgework may not be worn into surgery.  Leave your suitcase in the car.  After surgery it may be brought to your room.  For patients admitted to the hospital, discharge time will be determined by your treatment team.  Patients discharged the day of surgery will not be allowed to drive home.   Name and phone number of your driver:  family Special instructions:  None  Please read over the following fact sheets that you were given. Pain Booklet, Coughing and Deep Breathing, Blood Transfusion Information, Lab Information, Total Joint Packet, MRSA Information, Surgical Site Infection Prevention, Anesthesia Post-op Instructions and Care and Recovery After Surgery       Total Knee Replacement Total knee replacement is a procedure to replace the knee joint with an artificial (prosthetic) knee joint. The purpose of this surgery is to reduce knee pain and improve knee function. The prosthetic knee joint (prosthesis) may be made of metal, plastic or ceramic. It replaces parts of the thigh bone (femur), lower leg bone (tibia), and kneecap (patella) that are removed during the procedure. Tell a health care provider about:  Any allergies you have.  All medicines you are  taking, including vitamins, herbs, eye drops, creams, and over-the-counter medicines.  Any problems you or family members have had with anesthetic medicines.  Any blood disorders you have.  Any surgeries you have had.  Any medical conditions you have.  Whether you are pregnant or may be pregnant. What are the risks? Generally, this is a safe procedure. However, problems may occur, including:  Infection.  Bleeding.  Allergic reactions to medicines.  Damage to other structures or organs.  Decreased range of motion of the knee.  Instability of the knee.  Loosening of the prosthetic joint.  Knee pain that does not go away (chronic pain).  What happens before the procedure?  Ask your health care provider about: ? Changing or stopping your regular medicines. This is especially important if you are taking diabetes medicines or blood thinners. ? Taking medicines such as aspirin and ibuprofen. These medicines can thin your blood. Do not take these medicines before your procedure if your health care provider instructs you not to.  Have dental care and routine cleanings completed before your procedure. Plan to not have dental work done for 3 months after your procedure. Germs from anywhere in your body, including your mouth, can travel to your new joint and infect it.  Follow instructions from your health care provider about eating or drinking restrictions.  Ask your health care provider how your surgical site will be marked or identified.  You may be given  antibiotic medicine to help prevent infection.  If your health care provider prescribes physical therapy, do exercises as instructed.  Do not use any tobacco products, such as cigarettes, chewing tobacco, or e-cigarettes. If you need help quitting, ask your health care provider.  You may have a physical exam.  You may have tests, such as: ? X-rays. ? MRI. ? CT scan. ? Bone scans.  You may have a blood or urine sample  taken.  Plan to have someone take you home after the procedure.  If you will be going home right after the procedure, plan to have someone with you for at least 24 hours. It is recommended that you have someone to help care for you for at least 4-6 weeks after your procedure. What happens during the procedure?  To reduce your risk of infection: ? Your health care team will wash or sanitize their hands. ? Your skin will be washed with soap.  An IV tube will be inserted into one of your veins.  You will be given one or more of the following: ? A medicine to help you relax (sedative). ? A medicine to numb the area (local anesthetic). ? A medicine to make you fall asleep (general anesthetic). ? A medicine that is injected into your spine to numb the area below and slightly above the injection site (spinal anesthetic). ? A medicine that is injected into an area of your body to numb everything below the injection site (regional anesthetic).  An incision will be made in your knee.  Damaged cartilage and bone will be removed from your femur, tibia, and patella.  Parts of the prosthesis (liners) will be placed over the areas of bone and cartilage that were removed. A metal liner will be placed over your femur, and plastic liners will be placed over your tibia and the underside of your patella.  One or more small tubes (drains) may be placed near your incision to help drain extra fluid from your surgical site.  Your incision will be closed with stitches (sutures), skin glue, or adhesive strips. Medicine may be applied to your incision.  A bandage (dressing) will be placed over your incision. The procedure may vary among health care providers and hospitals. What happens after the procedure?  Your blood pressure, heart rate, breathing rate, and blood oxygen level will be monitored often until the medicines you were given have worn off.  You may continue to receive fluids and medicines through  an IV tube.  You will have some pain. Pain medicines will be available to help you.  You may have fluid coming from one or more drains in your incision.  You may have to wear compression stockings. These stockings help to prevent blood clots and reduce swelling in your legs.  You will be encouraged to move around as much as possible.  You may be given a continuous passive motion machine to use at home. You will be shown how to use this machine.  Do not drive for 24 hours if you received a sedative. This information is not intended to replace advice given to you by your health care provider. Make sure you discuss any questions you have with your health care provider. Document Released: 02/28/2001 Document Revised: 12/25/2016 Document Reviewed: 10/29/2015 Elsevier Interactive Patient Education  2018 [place].  Total Knee Replacement, Care After Refer to this sheet in the next few weeks. These instructions provide you with information about caring for yourself after your procedure. Your health  care provider may also give you more specific instructions. Your treatment has been planned according to current medical practices, but problems sometimes occur. Call your health care provider if you have any problems or questions after your procedure. What can I expect after the procedure? After the procedure, it is common to have:  Pain and swelling.  A small amount of blood or clear fluid coming from your incision.  Limited range of motion.  Follow these instructions at home: Medicines  Take over-the-counter and prescription medicines only as told by your health care provider.  If you were prescribed an antibiotic medicine, take it as told by your health care provider. Do not stop taking the antibiotic even if you start to feel better.  If you were prescribed a blood thinner (anticoagulant), take it as told by your health care provider. If you have a splint or brace:  Wear the  immobilizer as told by your health care provider. Remove it only as told by your health care provider.  Loosen the immobilizer if your toes tingle, become numb, or turn cold and blue.  Do not let your immobilizer get wet if it is not waterproof.  Keep the immobilizer clean. Bathing   Do not take baths, swim, or use a hot tub until your health care provider approves. Ask your health care provider if you can take showers. You may only be allowed to take sponge baths for bathing.  If you have an immobilizer that is not waterproof, cover it with a watertight covering when you take a bath or shower.  Keep your bandage (dressing) dry until your health care provider says it can be removed. Incision care and drain care  Check your incision area and drain site every day for signs of infection. Check for: ? More redness, swelling, or pain. ? More fluid or blood. ? Warmth. ? Pus or a bad smell.  Follow instructions from your health care provider about how to take care of your incision. Make sure you: ? Wash your hands with soap and water before you change your dressing. If soap and water are not available, use hand sanitizer. ? Change your dressing as told by your health care provider. ? Leave stitches (sutures), skin glue, or adhesive strips in place. These skin closures may need to stay in place for 2 weeks or longer. If adhesive strip edges start to loosen and curl up, you may trim the loose edges. Do not remove adhesive strips completely unless your health care provider tells you to do that.  If you have a drain, follow instructions from your health care provider about caring for it. Do not remove the drain tube or any dressings around the tube opening unless your health care provider approves. Managing pain, stiffness, and swelling  If directed, put ice on your knee. ? Put ice in a plastic bag. ? Place a towel between your skin and the bag. ? Leave the ice on for 20 minutes, 2-3 times per  day.  If directed, apply heat to the affected area as often as told by your health care provider. Use the heat source that your health care provider recommends, such as a moist heat pack or a heating pad. ? Place a towel between your skin and the heat source. ? Leave the heat on for 20-30 minutes. ? Remove the heat if your skin turns bright red. This is especially important if you are unable to feel pain, heat, or cold. You may have a greater  risk of getting burned.  Move your toes often to avoid stiffness and to lessen swelling.  Raise (elevate) your knee above the level of your heart while you are sitting or lying down.  Wear elastic knee support for as long as told by your health care provider. Driving   Do not drive until your health care provider approves. Ask your health care provider when it is safe to drive if you have an immobilizer on your knee.  Do not drive or operate heavy machinery while taking prescription pain medicine.  Do not drive for 24 hours if you received a sedative. Activity  Do not lift anything that is heavier than 10 lb (4.5 kg) until your health care provider approves.  Do not play contact sports until your health care provider approves.  Avoid high-impact activities, including running, jumping rope, and jumping jacks.  Avoid sitting for a long time without moving. Get up and move around at least every few hours.  If physical therapy was prescribed, do exercises as told by your health care provider.  Return to your normal activities as told by your health care provider. Ask your health care provider what activities are safe for you. Safety  Do not use your leg to support your body weight until your health care provider approves. Use crutches or a walker as told by your health care provider. General instructions   Do not have any dental work done for at least 3 months after your surgery. When you do have dental work done, tell your dentist about your  joint replacement.  Do not use any tobacco products, such as cigarettes, chewing tobacco, or e-cigarettes. If you need help quitting, ask your health care provider.  Wear compression stockings as told by your health care provider. These stockings help to prevent blood clots and reduce swelling in your legs.  If you have been sent home with a continuous passive motion machine, use it as told by your health care provider.  Drink enough fluid to keep your urine clear or pale yellow.  If you have been instructed to lose weight, follow instructions from your health care provider about how to do this safely.  Keep all follow-up visits as told by your health care provider. This is important. Contact a health care provider if:  You have more redness, swelling, or pain around your incision or drain.  You have more fluid or blood coming from your incision or drain.  Your incision or drain site feels warm to the touch.  You have pus or a bad smell coming from your incision or drain.  You have a fever.  Your incision breaks open after your health care provider removes your sutures, skin glue, or adhesive tape.  Your prosthesis feels loose.  You have knee pain that does not go away. Get help right away if:  You have a rash.  You have pain or swelling in your calf or thigh.  You have shortness of breath or difficulty breathing.  You have chest pain.  Your range of motion in your knee is getting worse. This information is not intended to replace advice given to you by your health care provider. Make sure you discuss any questions you have with your health care provider. Document Released: 06/11/2005 Document Revised: 07/26/2016 Document Reviewed: 10/29/2015 Elsevier Interactive Patient Education  2018 [place] y anestesia epidural Spinal Anesthesia and Epidural Anesthesia La anestesia raqudea y la anestesia epidural son formas de Theatre stage manager parte  del  cuerpo. Por lo general, se [employer] siguientes casos:  Durante el [place].  Durante cirugas en determinadas partes del cuerpo. Estas incluyen las siguientes: ? [place]. ? La pelvis. ? Las piernas. ? El abdomen.  Despus de cirugas en determinadas partes del cuerpo. Estas incluyen las siguientes: ? El vientre. ? El pecho.  Qu ocurre antes del procedimiento? Mantenerse hidratado Siga las indicaciones del mdico respecto de las bebidas (hidratacin). Estas pueden incluir lo siguiente:  Hasta 2horas antes del procedimiento, puede seguir bebiendo lquidos claros. Algunos ejemplos de estos lquidos son: ? [place]. ? Jugos de frutas claros. ? Caf negro. ? T solo.  Restricciones en las comidas y bebidas Addy comidas y las bebidas. Estas pueden incluir lo siguiente:  Ocho horas antes del procedimiento, deje de ingerir comidas o alimentos pesados. Por ejemplo, carne, alimentos fritos o alimentos grasos.  Seis horas antes del procedimiento, deje de ingerir comidas o alimentos livianos. Por ejemplo, tostadas o cereales.  Seis horas antes del procedimiento, deje de beber [place] o bebidas que [employer].  Dos horas antes del procedimiento, deje de beber lquidos transparentes.  Medicamentos Consulte al mdico si debe hacer o no lo siguiente:  Quarry manager o suspender los medicamentos que toma habitualmente. Esto es muy importante si toma medicamentos para la diabetes o anticoagulantes.  Cambiar o suspender los complementos alimenticios.  Tomar medicamentos como aspirina e ibuprofeno. Estos medicamentos pueden tener un efecto anticoagulante en la [place]. No tome estos medicamentos antes del procedimiento si el mdico le indica que no lo haga.  Instrucciones generales   No consuma ningn producto que contenga tabaco durante tanto tiempo como sea posible. ? Estos incluyen cigarrillos, tabaco para mascar y Science writer. ? Si necesita ayuda para dejar de fumar, consulte al mdico.  Pregntele al mdico si tendr que pasar la noche en el hospital o la clnica.  Si no tendr que pasar la noche: ? Pdale a alguien que lo lleve a su casa. ? Procure que alguien lo acompae por 24horas. Qu ocurre durante el procedimiento?  El Pharmacist, community parches en el pecho, un brazalete en el brazo o un sensor en el dedo. Estos estarn conectados a monitores.  Pueden colocarle un tubo (catter) intravenoso en una de las venas. Este tubo se [place] para Architectural technologist lquidos y Chief Strategy Officer.  Le administrarn un medicamento para ayudarlo a relajarse (sedante).  Le pedirn que: ? Se siente. ? Se acueste de lado. ? [place] y acerque el mentn al pecho.  Se limpiar una zona de la espalda.  Es posible que le inyecten un medicamento en la espalda. Este medicamento evitar que sienta el dolor de la inyeccin del anestsico.  Se introducir una aguja entre los huesos de la espalda. Mientras hacen esto: ? Respire con normalidad. ? Trate de no moverse. ? Qudese lo ms quieto posible. ? Dgale al mdico si siente hormigueo o dolor que se extienden por la pierna.  Le inyectarn el anestsico.  Si necesita ms medicamento, le colocarn un tubo (catter) en el lugar donde le pusieron la inyeccin. El tubo se usar para administrarle ms medicamentos. Se lo dejarn colocado si necesita analgsicos despus del procedimiento.  Pueden colocarle una venda (vendaje) en la espalda. Este procedimiento puede variar segn el mdico y el hospital. Sander Nephew sucede despus del procedimiento?  Haga reposo en cama hasta que el mdico lo autorice a Writer.  Los mdicos lo controlarn con Risk manager  que hayan desaparecido los [employer].  Si tiene un tubo en la espalda, se lo quitarn cuando ya no lo necesite.  No conduzca durante 24horas si le dieron un medicamento para ayudarlo a  que se relaje (sedante).  Es normal sentir Higher education careers adviser (nuseas) y picazn. Hay medicamentos que pueden ayudar. Tambin es normal tener lo siguiente: ? Somnolencia. ? Vmitos. ? Entumecimiento u hormigueo en las piernas. ? Problemas para orinar. Esta informacin no tiene Marine scientist el consejo del mdico. Asegrese de hacerle al mdico cualquier pregunta que tenga. Document Released: 04/06/2017 Document Revised: 04/06/2017 Document Reviewed: 03/15/2016 Elsevier Interactive Patient Education  2018 [employer].  Spinal Anesthesia and Epidural Anesthesia, Care After These instructions give you information about caring for yourself after your procedure. Your doctor may also give you more specific instructions. Call your doctor if you have any problems or questions after your procedure. Follow these instructions at home: For at least 24 hours after the procedure:   Do not: ? Do activities where you could fall or get hurt (injured). ? Drive. ? Use heavy machinery. ? Drink alcohol. ? Take sleeping pills or medicines that make you sleepy (drowsy). ? Make important decisions. ? Sign legal documents. ? Take care of children on your own.  Rest. Eating and drinking  If you throw up (vomit), drink water, juice, or soup when you can drink without throwing up.  Make sure you do not feel like throwing up (are not nauseous) before you eat.  Follow the diet that is recommended by your doctor. General instructions  Have a responsible adult stay with you until you are awake and alert.  Take over-the-counter and prescription medicines only as told by your doctor.  If you smoke, do not smoke unless an adult is watching.  Keep all follow-up visits as told by your doctor. This is important. Contact a doctor if:  It has been more than one day since your procedure and you feel like throwing up.  It has been more than one day since your procedure and you throw up.  You have a  rash. Get help right away if:  You have a fever.  You have a headache that lasts a long time.  You have a very bad headache.  Your vision is blurry.  You see two of a single object (double vision).  You are dizzy or light-headed.  You faint.  Your arms or legs tingle, feel weak, or get numb.  You have trouble breathing.  You cannot pee (urinate). This information is not intended to replace advice given to you by your health care provider. Make sure you discuss any questions you have with your health care provider. Document Released: 03/15/2016 Document Revised: 07/15/2016 Document Reviewed: 03/15/2016 Elsevier Interactive Patient Education  2018 [place] Anesthesia, Adult General anesthesia is the use of medicines to make a person "go to sleep" (be unconscious) for a medical procedure. General anesthesia is often recommended when a procedure:  Is long.  Requires you to be still or in an unusual position.  Is major and can cause you to lose blood.  Is impossible to do without general anesthesia.  The medicines used for general anesthesia are called general anesthetics. In addition to making you sleep, the medicines:  Prevent pain.  Control your blood pressure.  Relax your muscles.  Tell a health care provider about:  Any allergies you have.  All medicines you are taking, including vitamins, herbs, eye drops,  creams, and over-the-counter medicines.  Any problems you or family members have had with anesthetic medicines.  Types of anesthetics you have had in the past.  Any bleeding disorders you have.  Any surgeries you have had.  Any medical conditions you have.  Any history of heart or lung conditions, such as heart failure, sleep apnea, or chronic obstructive pulmonary disease (COPD).  Whether you are pregnant or may be pregnant.  Whether you use tobacco, alcohol, marijuana, or street drugs.  Any history of Armed forces logistics/support/administrative officer.  Any  history of depression or anxiety. What are the risks? Generally, this is a safe procedure. However, problems may occur, including:  Allergic reaction to anesthetics.  Lung and heart problems.  Inhaling food or liquids from your stomach into your lungs (aspiration).  Injury to nerves.  Waking up during your procedure and being unable to move (rare).  Extreme agitation or a state of mental confusion (delirium) when you wake up from the anesthetic.  Air in the bloodstream, which can lead to stroke.  These problems are more likely to develop if you are having a major surgery or if you have an advanced medical condition. You can prevent some of these complications by answering all of your health care provider's questions thoroughly and by following all pre-procedure instructions. General anesthesia can cause side effects, including:  Nausea or vomiting  A sore throat from the breathing tube.  Feeling cold or shivery.  Feeling tired, washed out, or achy.  Sleepiness or drowsiness.  Confusion or agitation.  What happens before the procedure? Staying hydrated Follow instructions from your health care provider about hydration, which may include:  Up to 2 hours before the procedure - you may continue to drink clear liquids, such as water, clear fruit juice, black coffee, and plain tea.  Eating and drinking restrictions Follow instructions from your health care provider about eating and drinking, which may include:  8 hours before the procedure - stop eating heavy meals or foods such as meat, fried foods, or fatty foods.  6 hours before the procedure - stop eating light meals or foods, such as toast or cereal.  6 hours before the procedure - stop drinking milk or drinks that contain milk.  2 hours before the procedure - stop drinking clear liquids.  Medicines  Ask your health care provider about: ? Changing or stopping your regular medicines. This is especially important if  you are taking diabetes medicines or blood thinners. ? Taking medicines such as aspirin and ibuprofen. These medicines can thin your blood. Do not take these medicines before your procedure if your health care provider instructs you not to. ? Taking new dietary supplements or medicines. Do not take these during the week before your procedure unless your health care provider approves them.  If you are told to take a medicine or to continue taking a medicine on the day of the procedure, take the medicine with sips of water. General instructions   Ask if you will be going home the same day, the following day, or after a longer hospital stay. ? Plan to have someone take you home. ? Plan to have someone stay with you for the first 24 hours after you leave the hospital or clinic.  For 3-6 weeks before the procedure, try not to use any tobacco products, such as cigarettes, chewing tobacco, and e-cigarettes.  You may brush your teeth on the morning of the procedure, but make sure to spit out the toothpaste. What happens  during the procedure?  You will be given anesthetics through a mask and through an IV tube in one of your veins.  You may receive medicine to help you relax (sedative).  As soon as you are asleep, a breathing tube may be used to help you breathe.  An anesthesia specialist will stay with you throughout the procedure. He or she will help keep you comfortable and safe by continuing to give you medicines and adjusting the amount of medicine that you get. He or she will also watch your blood pressure, pulse, and oxygen levels to make sure that the anesthetics do not cause any problems.  If a breathing tube was used to help you breathe, it will be removed before you wake up. The procedure may vary among health care providers and hospitals. What happens after the procedure?  You will wake up, often slowly, after the procedure is complete, usually in a recovery area.  Your blood  pressure, heart rate, breathing rate, and blood oxygen level will be monitored until the medicines you were given have worn off.  You may be given medicine to help you calm down if you feel anxious or agitated.  If you will be going home the same day, your health care provider may check to make sure you can stand, drink, and urinate.  Your health care providers will treat your pain and side effects before you go home.  Do not drive for 24 hours if you received a sedative.  You may: ? Feel nauseous and vomit. ? Have a sore throat. ? Have mental slowness. ? Feel cold or shivery. ? Feel sleepy. ? Feel tired. ? Feel sore or achy, even in parts of your body where you did not have surgery. This information is not intended to replace advice given to you by your health care provider. Make sure you discuss any questions you have with your health care provider. Document Released: 02/29/2008 Document Revised: 05/04/2016 Document Reviewed: 11/06/2015 Elsevier Interactive Patient Education  2018 [place] Anesthesia, Adult, Care After These instructions provide you with information about caring for yourself after your procedure. Your health care provider may also give you more specific instructions. Your treatment has been planned according to current medical practices, but problems sometimes occur. Call your health care provider if you have any problems or questions after your procedure. What can I expect after the procedure? After the procedure, it is common to have:  Vomiting.  A sore throat.  Mental slowness.  It is common to feel:  Nauseous.  Cold or shivery.  Sleepy.  Tired.  Sore or achy, even in parts of your body where you did not have surgery.  Follow these instructions at home: For at least 24 hours after the procedure:  Do not: ? Participate in activities where you could fall or become injured. ? Drive. ? Use heavy machinery. ? Drink alcohol. ? Take  sleeping pills or medicines that cause drowsiness. ? Make important decisions or sign legal documents. ? Take care of children on your own.  Rest. Eating and drinking  If you vomit, drink water, juice, or soup when you can drink without vomiting.  Drink enough fluid to keep your urine clear or pale yellow.  Make sure you have little or no nausea before eating solid foods.  Follow the diet recommended by your health care provider. General instructions  Have a responsible adult stay with you until you are awake and alert.  Return to your normal activities  as told by your health care provider. Ask your health care provider what activities are safe for you.  Take over-the-counter and prescription medicines only as told by your health care provider.  If you smoke, do not smoke without supervision.  Keep all follow-up visits as told by your health care provider. This is important. Contact a health care provider if:  You continue to have nausea or vomiting at home, and medicines are not helpful.  You cannot drink fluids or start eating again.  You cannot urinate after 8-12 hours.  You develop a skin rash.  You have fever.  You have increasing redness at the site of your procedure. Get help right away if:  You have difficulty breathing.  You have chest pain.  You have unexpected bleeding.  You feel that you are having a life-threatening or urgent problem. This information is not intended to replace advice given to you by your health care provider. Make sure you discuss any questions you have with your health care provider. Document Released: 02/28/2001 Document Revised: 04/26/2016 Document Reviewed: 11/06/2015 Elsevier Interactive Patient Education  [employer].

## 2017-12-22 ENCOUNTER — Encounter (HOSPITAL_COMMUNITY): Payer: PPO

## 2017-12-25 NOTE — Procedures (Signed)
  Gambell A. Merlene Laughter, MD     [email or web address]             HOME SLEEP STUDY  LOCATION: [facility]  Patient Name: Emily [name], Emily [name] Date: 12/21/2017 Gender: Female D.O.B: 04-25-1951 Age (years): 16 Referring Provider: Sallee Lange Height (inches): 62 Interpreting Physician: Phillips Odor MD, ABSM Weight (lbs): 176 RPSGT: Peak, Robert BMI: 32 MRN: [id number] Neck Size: CLINICAL INFORMATION Sleep Study Type: HST     Indication for sleep study: Excessive Daytime Sleepiness, Fatigue, Snoring     Epworth Sleepiness Score: NA  SLEEP STUDY TECHNIQUE A multi-channel overnight portable sleep study was performed. The channels recorded were: nasal airflow, thoracic respiratory movement, and oxygen saturation with a pulse oximetry. Snoring was also monitored.  MEDICATIONS Patient self administered medications include: N/A.  Current Outpatient Medications:  .  acetaminophen (TYLENOL) 500 MG tablet, Take 1,000 mg by mouth 2 (two) times daily as needed for moderate pain or headache. , Disp: , Rfl:  .  ALPRAZolam (XANAX) 0.5 MG tablet, 1/2-1 BID prn for anxiety,caution drowsiness. (Patient taking differently: Take 0.25-0.5 mg by mouth 2 (two) times daily as needed for anxiety. ), Disp: 30 tablet, Rfl: 0 .  diazepam (VALIUM) 5 MG tablet, Take 5 mg by mouth every 6 (six) hours as needed for muscle spasms. , Disp: , Rfl:  .  DULoxetine (CYMBALTA) 60 MG capsule, Take 1 capsule (60 mg total) by mouth daily., Disp: 30 capsule, Rfl: 5 .  fexofenadine (ALLEGRA) 180 MG tablet, Take 180 mg by mouth daily as needed for allergies or rhinitis., Disp: , Rfl:  .  hydrochlorothiazide (HYDRODIURIL) 25 MG tablet, Take 1 tablet (25 mg total) by mouth daily., Disp: 30 tablet, Rfl: 5 .  pantoprazole (PROTONIX) 40 MG tablet, Take 1 tablet (40 mg total) by mouth daily., Disp: 30 tablet, Rfl: 8 .  potassium chloride SA (KLOR-CON M20) 20 MEQ tablet, Take 1 tablet (20 mEq total) by mouth  daily., Disp: 30 tablet, Rfl: 5 .  pravastatin (PRAVACHOL) 20 MG tablet, Take 1 tablet (20 mg total) by mouth daily., Disp: 30 tablet, Rfl: 5 .  verapamil (CALAN-SR) 180 MG CR tablet, Take 1 tablet (180 mg total) by mouth daily., Disp: 30 tablet, Rfl: 5   SLEEP ARCHITECTURE Patient was studied for 450.9 minutes. The sleep efficiency was 94.0 % and the patient was supine for 51.4%. The arousal index was 0.0 per hour.  RESPIRATORY PARAMETERS The overall AHI was 60.7 per hour, with a central apnea index of 0.1 per hour.  The oxygen nadir was 79% during sleep.     CARDIAC DATA Mean heart rate during sleep was 72.6 bpm.  IMPRESSIONS Severe obstructive sleep apnea occurred during this study (AHI = 60.7/h). AutoPAP 8-14 is suggested.    Delano Metz, MD Diplomate, American Board of Sleep Medicine.  ELECTRONICALLY SIGNED ON:  12/25/2017, 10:32 PM [place] PH: [phone]   FX: [phone] [place]

## 2017-12-26 ENCOUNTER — Encounter (HOSPITAL_COMMUNITY): Payer: Self-pay

## 2017-12-26 ENCOUNTER — Encounter (HOSPITAL_COMMUNITY)
Admission: RE | Admit: 2017-12-26 | Discharge: 2017-12-26 | Disposition: A | Discharge status: Home / Self Care | Payer: PPO | Source: Ambulatory Visit | Attending: Orthopedic Surgery | Admitting: Orthopedic Surgery

## 2017-12-26 ENCOUNTER — Other Ambulatory Visit: Payer: Self-pay

## 2017-12-26 DIAGNOSIS — F329 Major depressive disorder, single episode, unspecified: Secondary | ICD-10-CM | POA: Diagnosis present

## 2017-12-26 DIAGNOSIS — I10 Essential (primary) hypertension: Secondary | ICD-10-CM | POA: Diagnosis present

## 2017-12-26 DIAGNOSIS — Z882 Allergy status to sulfonamides status: Secondary | ICD-10-CM | POA: Diagnosis not present

## 2017-12-26 DIAGNOSIS — I509 Heart failure, unspecified: Secondary | ICD-10-CM | POA: Insufficient documentation

## 2017-12-26 DIAGNOSIS — G4730 Sleep apnea, unspecified: Secondary | ICD-10-CM | POA: Diagnosis present

## 2017-12-26 DIAGNOSIS — Z471 Aftercare following joint replacement surgery: Secondary | ICD-10-CM | POA: Diagnosis not present

## 2017-12-26 DIAGNOSIS — Z96651 Presence of right artificial knee joint: Secondary | ICD-10-CM | POA: Diagnosis not present

## 2017-12-26 DIAGNOSIS — I110 Hypertensive heart disease with heart failure: Secondary | ICD-10-CM

## 2017-12-26 DIAGNOSIS — E7800 Pure hypercholesterolemia, unspecified: Secondary | ICD-10-CM | POA: Insufficient documentation

## 2017-12-26 DIAGNOSIS — Z833 Family history of diabetes mellitus: Secondary | ICD-10-CM | POA: Diagnosis not present

## 2017-12-26 DIAGNOSIS — Z23 Encounter for immunization: Secondary | ICD-10-CM | POA: Diagnosis present

## 2017-12-26 DIAGNOSIS — Z96652 Presence of left artificial knee joint: Secondary | ICD-10-CM | POA: Diagnosis present

## 2017-12-26 DIAGNOSIS — Z01818 Encounter for other preprocedural examination: Secondary | ICD-10-CM | POA: Insufficient documentation

## 2017-12-26 DIAGNOSIS — K219 Gastro-esophageal reflux disease without esophagitis: Secondary | ICD-10-CM

## 2017-12-26 DIAGNOSIS — F419 Anxiety disorder, unspecified: Secondary | ICD-10-CM | POA: Diagnosis present

## 2017-12-26 DIAGNOSIS — Z981 Arthrodesis status: Secondary | ICD-10-CM | POA: Diagnosis not present

## 2017-12-26 DIAGNOSIS — M1711 Unilateral primary osteoarthritis, right knee: Secondary | ICD-10-CM

## 2017-12-26 DIAGNOSIS — Z885 Allergy status to narcotic agent status: Secondary | ICD-10-CM | POA: Diagnosis not present

## 2017-12-26 DIAGNOSIS — Z8249 Family history of ischemic heart disease and other diseases of the circulatory system: Secondary | ICD-10-CM | POA: Diagnosis not present

## 2017-12-26 DIAGNOSIS — G4733 Obstructive sleep apnea (adult) (pediatric): Secondary | ICD-10-CM | POA: Insufficient documentation

## 2017-12-26 DIAGNOSIS — Z79899 Other long term (current) drug therapy: Secondary | ICD-10-CM | POA: Insufficient documentation

## 2017-12-26 HISTORY — DX: Unspecified osteoarthritis, unspecified site: M19.90

## 2017-12-26 LAB — COMPREHENSIVE METABOLIC PANEL
ALT: 22 U/L (ref 14–54)
ANION GAP: 11 (ref 5–15)
AST: 22 U/L (ref 15–41)
Albumin: 4.1 g/dL (ref 3.5–5.0)
Alkaline Phosphatase: 73 U/L (ref 38–126)
BILIRUBIN TOTAL: 0.6 mg/dL (ref 0.3–1.2)
BUN: 8 mg/dL (ref 6–20)
CALCIUM: 9.1 mg/dL (ref 8.9–10.3)
CO2: 25 mmol/L (ref 22–32)
Chloride: 101 mmol/L (ref 101–111)
Creatinine, Ser: 0.72 mg/dL (ref 0.44–1.00)
GFR calc Af Amer: >60 mL/min (ref >60)
Glucose, Bld: 122 mg/dL — ABNORMAL HIGH (ref 65–99)
Potassium: 3.5 mmol/L (ref 3.5–5.1)
Sodium: 137 mmol/L (ref 135–145)
TOTAL PROTEIN: 7.5 g/dL (ref 6.5–8.1)

## 2017-12-26 LAB — CBC WITH DIFFERENTIAL/PLATELET
Basophils Absolute: 0 10*3/uL (ref 0.0–0.1)
Basophils Relative: 1 %
Eosinophils Absolute: 0.2 10*3/uL (ref 0.0–0.7)
Eosinophils Relative: 3 %
HEMATOCRIT: 39.7 % (ref 36.0–46.0)
Hemoglobin: 13.8 g/dL (ref 12.0–15.0)
LYMPHS PCT: 32 %
Lymphs Abs: 1.8 10*3/uL (ref 0.7–4.0)
MCH: 31.7 pg (ref 26.0–34.0)
MCHC: 34.8 g/dL (ref 30.0–36.0)
MCV: 91.3 fL (ref 78.0–100.0)
MONO ABS: 0.4 10*3/uL (ref 0.1–1.0)
MONOS PCT: 7 %
Neutro Abs: 3.1 10*3/uL (ref 1.7–7.7)
Neutrophils Relative %: 57 %
Platelets: 239 10*3/uL (ref 150–400)
RBC: 4.35 MIL/uL (ref 3.87–5.11)
RDW: 12.7 % (ref 11.5–15.5)
WBC: 5.5 10*3/uL (ref 4.0–10.5)

## 2017-12-26 LAB — PROTIME-INR
INR: 0.91
PROTHROMBIN TIME: 12.1 s (ref 11.4–15.2)

## 2017-12-26 LAB — SURGICAL PCR SCREEN
MRSA, PCR: NEGATIVE
Staphylococcus aureus: NEGATIVE

## 2017-12-26 LAB — PREPARE RBC (CROSSMATCH)

## 2017-12-27 ENCOUNTER — Encounter (HOSPITAL_COMMUNITY): Payer: PPO

## 2017-12-28 MED ORDER — TRANEXAMIC ACID 1000 MG/10ML IV SOLN
1000.0000 mg | INTRAVENOUS | Status: AC
  Administered 2017-12-29: 1000 mg via INTRAVENOUS
  Filled 2017-12-28: qty 10

## 2017-12-28 NOTE — H&P (Signed)
Patient ID: Emily [name], female   DOB: [birth year], 67 y.o.   MRN: [id number]       Chief Complaint  Patient presents with  . Knee Pain      right       67 year old female status post lumbar fusion status post left total knee presents for evaluation of her right knee.   She complains of dull constant severe dull aching pain lateral joint line entire knee posterior knee medial hamstring for several years now unrelieved by physical therapy.  She also complains that the patella is slightly red and out of place and she feels like she might have something in the joint.  She complains of the right leg gives way         Review of Systems  Constitutional: Negative.   Respiratory: Negative.   Cardiovascular: Negative.   Musculoskeletal: Positive for back pain.  Skin: Negative.   Neurological: Negative for tingling, sensory change and focal weakness.    Active Medications      Current Meds  Medication Sig  . acetaminophen (TYLENOL) 500 MG tablet Take 1,000 mg by mouth 3 (three) times daily as needed for moderate pain.   Marland Kitchen ALPRAZolam (XANAX) 0.5 MG tablet 1/2-1 BID prn for anxiety,caution drowsiness.  . diazepam (VALIUM) 5 MG tablet Take 5 mg by mouth every 6 (six) hours as needed for anxiety.  . DULoxetine (CYMBALTA) 60 MG capsule Take 1 capsule (60 mg total) by mouth daily.  Marland Kitchen Fexofenadine HCl (ALLEGRA PO) Take 1 tablet by mouth daily as needed (allergies).   . hydrochlorothiazide (HYDRODIURIL) 25 MG tablet Take 1 tablet (25 mg total) by mouth daily.  . pantoprazole (PROTONIX) 40 MG tablet Take 1 tablet (40 mg total) by mouth daily.  . potassium chloride SA (KLOR-CON M20) 20 MEQ tablet Take 1 tablet (20 mEq total) by mouth daily.  . pravastatin (PRAVACHOL) 20 MG tablet Take 1 tablet (20 mg total) by mouth daily.  . verapamil (CALAN-SR) 180 MG CR tablet Take 1 tablet (180 mg total) by mouth daily.            Past Medical History:  Diagnosis Date  . Acid reflux    . Anxiety    .  CHF (congestive heart failure) (HCC)      90's swelling body-no breathing problems at that time-no cardiac -in dr s Wolfgang [place] office [place] give lasix and potassium  . Complication of anesthesia      History of being told she was allergic to something in 1980 no problems since  . Depression    . Fibromyalgia    . History of blood transfusion reaction 1978  . History of hiatal hernia    . HNP (herniated nucleus pulposus)    . Hypertension      Does not see a cardiologist no stress or echo  . Joint pain    . Nerve pain      right leg   . Ruptured lumbar disc    . Sleep apnea      Uses CPAP every night setting at 7 mm/Hg    Past Surgical History:  Procedure Laterality Date  . BACK SURGERY     x3  . BLADDER REPAIR     tear during during birth of child  . CARPAL TUNNEL RELEASE Bilateral   . CESAREAN SECTION    . CHOLECYSTECTOMY     gallbladder disease; stayed sick  . COLONOSCOPY N/A 04/28/2016   Procedure: COLONOSCOPY;  Surgeon: Mechele Dawley  Laural Golden, MD;  Location: [facility];  Service: Endoscopy;  Laterality: N/A;  . ESOPHAGOGASTRODUODENOSCOPY N/A 04/28/2016   Procedure: ESOPHAGOGASTRODUODENOSCOPY (EGD);  Surgeon: [name], MD;  Location: [facility];  Service: Endoscopy;  Laterality: N/A;  12:00  . GALLBLADDER SURGERY    . knee right (torn Cart)    . REPLACEMENT TOTAL KNEE     left  . ROTATOR CUFF REPAIR Bilateral    right and left  . SPINAL FUSION    . TUBAL LIGATION    . vein right leg removed    . wrist ganglion cyst left     Family History  Problem Relation Age of Onset  . Hypertension Father   . Cancer Father        liposarcoma  . Heart disease Mother   . Cancer Unknown        family history   . Diabetes Unknown        family history   . Coronary artery disease Unknown        family history   . Arthritis Unknown        family history   . Asthma Unknown   . Diabetes Brother   . Heart disease Brother   . Coronary artery disease Brother 59       CABD     Social History   Tobacco Use  . Smoking status: Never Smoker  . Smokeless tobacco: Never Used  Substance Use Topics  . Alcohol use: No  . Drug use: No            Allergies  Allergen Reactions  . Codeine Itching  . Oxycodone-Acetaminophen Itching  . Sulfamethoxazole-Trimethoprim Hives  . Celebrex [Celecoxib] Other (See Comments)      Homicidal thoughts.  . Hydrocodone-Acetaminophen Itching      Takes with benadryl  . Losartan        Skin on arms got loose  . Lyrica [Pregabalin]        When combined with narcotics will cause SpO2 to drop  . Tramadol Itching      BP (!) 147/91   Pulse 83   Ht 5\' 2"  (1.575 m)   Wt 176 lb (79.8 kg)   BMI 32.19 kg/m      Physical Exam  Constitutional: She is oriented to person, place, and time. She appears well-developed and well-nourished. No distress.  Musculoskeletal:       Legs: Neurological: She is alert and oriented to person, place, and time. She displays normal reflexes. No sensory deficit. She exhibits normal muscle tone. Coordination normal.  Skin: Skin is warm and dry. Capillary refill takes less than 2 seconds. She is not diaphoretic.  Psychiatric: She has a normal mood and affect.      Ortho Exam     Medical decision-making   Imaging: X-ray was done on July 2018 she had lateral compartment and medial compartment joint space narrowing with the lateral compartment worse.  We also saw subchondral sclerosis of the lateral plateau with effusion.  Tibiofemoral alignment was valgus 5-7 degrees       Encounter Diagnoses  Name Primary?  Marland Kitchen Arthritis of knee, right Yes  . History of lumbar surgery    . History of total left knee replacement        After discussion the patient agrees that there is enough pain for her to warrant knee replacement surgery she does not want to participate in any other nonoperative treatment protocols   Patient  is ready to have a right total knee arthroplasty scheduled for January 24   The  procedure has been fully reviewed with the patient; The risks and benefits of surgery have been discussed and explained and understood. Alternative treatment has also been reviewed, questions were encouraged and answered. The postoperative plan is also been reviewed.

## 2017-12-29 ENCOUNTER — Inpatient Hospital Stay (HOSPITAL_COMMUNITY): Payer: PPO | Admitting: Anesthesiology

## 2017-12-29 ENCOUNTER — Encounter (HOSPITAL_COMMUNITY)
Admission: RE | Disposition: A | Discharge status: Home Health | Payer: Self-pay | Source: Ambulatory Visit | Attending: Orthopedic Surgery

## 2017-12-29 ENCOUNTER — Inpatient Hospital Stay (HOSPITAL_COMMUNITY): Payer: PPO

## 2017-12-29 ENCOUNTER — Encounter (HOSPITAL_COMMUNITY): Payer: Self-pay | Admitting: *Deleted

## 2017-12-29 ENCOUNTER — Inpatient Hospital Stay (HOSPITAL_COMMUNITY)
Admission: RE | Admit: 2017-12-29 | Discharge: 2017-12-31 | DRG: 470 | Disposition: A | Discharge status: Home Health | Payer: PPO | Source: Ambulatory Visit | Attending: Orthopedic Surgery | Admitting: Orthopedic Surgery

## 2017-12-29 ENCOUNTER — Encounter (HOSPITAL_COMMUNITY): Payer: PPO

## 2017-12-29 ENCOUNTER — Other Ambulatory Visit: Payer: Self-pay

## 2017-12-29 DIAGNOSIS — Z882 Allergy status to sulfonamides status: Secondary | ICD-10-CM | POA: Diagnosis not present

## 2017-12-29 DIAGNOSIS — M1711 Unilateral primary osteoarthritis, right knee: Secondary | ICD-10-CM | POA: Diagnosis present

## 2017-12-29 DIAGNOSIS — Z833 Family history of diabetes mellitus: Secondary | ICD-10-CM | POA: Diagnosis not present

## 2017-12-29 DIAGNOSIS — Z981 Arthrodesis status: Secondary | ICD-10-CM

## 2017-12-29 DIAGNOSIS — K219 Gastro-esophageal reflux disease without esophagitis: Secondary | ICD-10-CM | POA: Diagnosis present

## 2017-12-29 DIAGNOSIS — Z471 Aftercare following joint replacement surgery: Secondary | ICD-10-CM | POA: Diagnosis not present

## 2017-12-29 DIAGNOSIS — I10 Essential (primary) hypertension: Secondary | ICD-10-CM | POA: Diagnosis present

## 2017-12-29 DIAGNOSIS — Z23 Encounter for immunization: Secondary | ICD-10-CM | POA: Diagnosis present

## 2017-12-29 DIAGNOSIS — F329 Major depressive disorder, single episode, unspecified: Secondary | ICD-10-CM | POA: Diagnosis present

## 2017-12-29 DIAGNOSIS — F419 Anxiety disorder, unspecified: Secondary | ICD-10-CM | POA: Diagnosis present

## 2017-12-29 DIAGNOSIS — Z885 Allergy status to narcotic agent status: Secondary | ICD-10-CM | POA: Diagnosis not present

## 2017-12-29 DIAGNOSIS — Z8249 Family history of ischemic heart disease and other diseases of the circulatory system: Secondary | ICD-10-CM | POA: Diagnosis not present

## 2017-12-29 DIAGNOSIS — G4730 Sleep apnea, unspecified: Secondary | ICD-10-CM | POA: Diagnosis present

## 2017-12-29 DIAGNOSIS — Z96652 Presence of left artificial knee joint: Secondary | ICD-10-CM | POA: Diagnosis present

## 2017-12-29 DIAGNOSIS — Z96651 Presence of right artificial knee joint: Secondary | ICD-10-CM

## 2017-12-29 HISTORY — PX: TOTAL KNEE ARTHROPLASTY: SHX125

## 2017-12-29 SURGERY — ARTHROPLASTY, KNEE, TOTAL
Anesthesia: Spinal | Site: Knee | Laterality: Right

## 2017-12-29 MED ORDER — FLEET ENEMA 7-19 GM/118ML RE ENEM: 1.0000 | ENEMA | Freq: Once | RECTAL | Status: DC | PRN

## 2017-12-29 MED ORDER — 0.9 % SODIUM CHLORIDE (POUR BTL) OPTIME
TOPICAL | Status: DC | PRN
  Administered 2017-12-29: 1000 mL

## 2017-12-29 MED ORDER — MIDAZOLAM HCL 2 MG/2ML IJ SOLN
INTRAMUSCULAR | Status: AC
  Filled 2017-12-29: qty 2

## 2017-12-29 MED ORDER — DIPHENHYDRAMINE HCL 25 MG PO CAPS
25.0000 mg | ORAL_CAPSULE | Freq: Once | ORAL | Status: AC
  Administered 2017-12-29: 25 mg via ORAL
  Filled 2017-12-29: qty 1

## 2017-12-29 MED ORDER — FENTANYL CITRATE (PF) 100 MCG/2ML IJ SOLN
INTRAMUSCULAR | Status: AC
  Filled 2017-12-29: qty 2

## 2017-12-29 MED ORDER — EPHEDRINE SULFATE 50 MG/ML IJ SOLN
INTRAMUSCULAR | Status: AC
  Filled 2017-12-29: qty 1

## 2017-12-29 MED ORDER — BUPIVACAINE IN DEXTROSE 0.75-8.25 % IT SOLN
INTRATHECAL | Status: DC | PRN
  Administered 2017-12-29: 15 mg via INTRATHECAL

## 2017-12-29 MED ORDER — DOCUSATE SODIUM 100 MG PO CAPS
100.0000 mg | ORAL_CAPSULE | Freq: Two times a day (BID) | ORAL | Status: DC
  Administered 2017-12-29 – 2017-12-31 (×5): 100 mg via ORAL
  Filled 2017-12-29 (×5): qty 1

## 2017-12-29 MED ORDER — PANTOPRAZOLE SODIUM 40 MG PO TBEC
40.0000 mg | DELAYED_RELEASE_TABLET | Freq: Every day | ORAL | Status: DC
  Administered 2017-12-30 – 2017-12-31 (×2): 40 mg via ORAL
  Filled 2017-12-29 (×2): qty 1

## 2017-12-29 MED ORDER — EPHEDRINE SULFATE 50 MG/ML IJ SOLN
INTRAMUSCULAR | Status: DC | PRN
  Administered 2017-12-29 (×2): 5 mg via INTRAVENOUS
  Administered 2017-12-29 (×2): 10 mg via INTRAVENOUS

## 2017-12-29 MED ORDER — BUPIVACAINE LIPOSOME 1.3 % IJ SUSP
INTRAMUSCULAR | Status: DC | PRN
  Administered 2017-12-29: 60 mL

## 2017-12-29 MED ORDER — SUCCINYLCHOLINE CHLORIDE 20 MG/ML IJ SOLN
INTRAMUSCULAR | Status: AC
  Filled 2017-12-29: qty 1

## 2017-12-29 MED ORDER — SODIUM CHLORIDE 0.9 % IJ SOLN
INTRAMUSCULAR | Status: AC
  Filled 2017-12-29: qty 10

## 2017-12-29 MED ORDER — PHENOL 1.4 % MT LIQD: 1.0000 | OROMUCOSAL | Status: DC | PRN

## 2017-12-29 MED ORDER — BISACODYL 5 MG PO TBEC: 5.0000 mg | DELAYED_RELEASE_TABLET | Freq: Every day | ORAL | Status: DC | PRN

## 2017-12-29 MED ORDER — BUPIVACAINE-EPINEPHRINE (PF) 0.5% -1:200000 IJ SOLN
INTRAMUSCULAR | Status: DC | PRN
  Administered 2017-12-29: 20 mL via PERINEURAL

## 2017-12-29 MED ORDER — DIPHENHYDRAMINE HCL 12.5 MG/5ML PO ELIX
12.5000 mg | ORAL_SOLUTION | ORAL | Status: DC
  Administered 2017-12-29: 25 mg via ORAL
  Administered 2017-12-29: 12.5 mg via ORAL
  Administered 2017-12-30: 25 mg via ORAL
  Administered 2017-12-30 (×4): 12.5 mg via ORAL
  Administered 2017-12-31 (×3): 25 mg via ORAL
  Filled 2017-12-29 (×3): qty 10
  Filled 2017-12-29 (×4): qty 5
  Filled 2017-12-29: qty 10
  Filled 2017-12-29: qty 5
  Filled 2017-12-29: qty 10
  Filled 2017-12-29: qty 5

## 2017-12-29 MED ORDER — FENTANYL CITRATE (PF) 100 MCG/2ML IJ SOLN
25.0000 ug | Freq: Once | INTRAMUSCULAR | Status: AC
  Administered 2017-12-29: 25 ug via INTRAVENOUS

## 2017-12-29 MED ORDER — METOCLOPRAMIDE HCL 10 MG PO TABS: 5.0000 mg | ORAL_TABLET | Freq: Three times a day (TID) | ORAL | Status: DC | PRN

## 2017-12-29 MED ORDER — PROPOFOL 10 MG/ML IV BOLUS
INTRAVENOUS | Status: AC
  Filled 2017-12-29: qty 40

## 2017-12-29 MED ORDER — MENTHOL 3 MG MT LOZG: 1.0000 | LOZENGE | OROMUCOSAL | Status: DC | PRN

## 2017-12-29 MED ORDER — SODIUM CHLORIDE 0.9 % IV SOLN
INTRAVENOUS | Status: DC
  Administered 2017-12-29 – 2017-12-30 (×2): via INTRAVENOUS

## 2017-12-29 MED ORDER — MIDAZOLAM HCL 5 MG/5ML IJ SOLN
INTRAMUSCULAR | Status: DC | PRN
  Administered 2017-12-29: 2 mg via INTRAVENOUS

## 2017-12-29 MED ORDER — FENTANYL CITRATE (PF) 100 MCG/2ML IJ SOLN
INTRAMUSCULAR | Status: DC | PRN
  Administered 2017-12-29: 25 ug via INTRATHECAL
  Administered 2017-12-29: 25 ug via INTRAVENOUS

## 2017-12-29 MED ORDER — PREGABALIN 50 MG PO CAPS
50.0000 mg | ORAL_CAPSULE | Freq: Once | ORAL | Status: AC
  Administered 2017-12-29: 50 mg via ORAL
  Filled 2017-12-29: qty 1

## 2017-12-29 MED ORDER — PNEUMOCOCCAL VAC POLYVALENT 25 MCG/0.5ML IJ INJ
0.5000 mL | INJECTION | INTRAMUSCULAR | Status: AC
  Administered 2017-12-30: 0.5 mL via INTRAMUSCULAR
  Filled 2017-12-29: qty 0.5

## 2017-12-29 MED ORDER — LACTATED RINGERS IV SOLN
INTRAVENOUS | Status: DC
  Administered 2017-12-29 (×2): via INTRAVENOUS

## 2017-12-29 MED ORDER — ONDANSETRON HCL 4 MG/2ML IJ SOLN: 4.0000 mg | Freq: Four times a day (QID) | INTRAMUSCULAR | Status: DC | PRN

## 2017-12-29 MED ORDER — OXYCODONE HCL 5 MG PO TABS: 5.0000 mg | ORAL_TABLET | ORAL | Status: DC | PRN

## 2017-12-29 MED ORDER — ONDANSETRON HCL 4 MG PO TABS: 4.0000 mg | ORAL_TABLET | Freq: Four times a day (QID) | ORAL | Status: DC | PRN

## 2017-12-29 MED ORDER — PROPOFOL 500 MG/50ML IV EMUL
INTRAVENOUS | Status: DC | PRN
  Administered 2017-12-29: 09:00:00 via INTRAVENOUS
  Administered 2017-12-29: 40 ug/kg/min via INTRAVENOUS

## 2017-12-29 MED ORDER — ALUM & MAG HYDROXIDE-SIMETH 200-200-20 MG/5ML PO SUSP: 30.0000 mL | ORAL | Status: DC | PRN

## 2017-12-29 MED ORDER — METHOCARBAMOL 1000 MG/10ML IJ SOLN
500.0000 mg | Freq: Four times a day (QID) | INTRAVENOUS | Status: DC | PRN
  Filled 2017-12-29: qty 5

## 2017-12-29 MED ORDER — MIDAZOLAM HCL 2 MG/2ML IJ SOLN
1.0000 mg | INTRAMUSCULAR | Status: DC
  Administered 2017-12-29: 2 mg via INTRAVENOUS

## 2017-12-29 MED ORDER — SODIUM CHLORIDE 0.9 % IJ SOLN
INTRAMUSCULAR | Status: AC
  Filled 2017-12-29: qty 40

## 2017-12-29 MED ORDER — CELECOXIB 400 MG PO CAPS: 400.0000 mg | ORAL_CAPSULE | Freq: Once | ORAL | Status: DC

## 2017-12-29 MED ORDER — ASPIRIN 81 MG PO CHEW
81.0000 mg | CHEWABLE_TABLET | Freq: Two times a day (BID) | ORAL | Status: DC
  Administered 2017-12-29 – 2017-12-31 (×4): 81 mg via ORAL
  Filled 2017-12-29 (×4): qty 1

## 2017-12-29 MED ORDER — DIPHENHYDRAMINE HCL 50 MG/ML IJ SOLN: 25.0000 mg | Freq: Once | INTRAMUSCULAR | Status: DC

## 2017-12-29 MED ORDER — POTASSIUM CHLORIDE CRYS ER 20 MEQ PO TBCR
20.0000 meq | EXTENDED_RELEASE_TABLET | Freq: Every day | ORAL | Status: DC
  Administered 2017-12-29 – 2017-12-31 (×3): 20 meq via ORAL
  Filled 2017-12-29 (×3): qty 1

## 2017-12-29 MED ORDER — HYDROMORPHONE HCL 1 MG/ML IJ SOLN
0.5000 mg | INTRAMUSCULAR | Status: DC | PRN
  Administered 2017-12-29 (×2): 0.5 mg via INTRAVENOUS
  Filled 2017-12-29 (×2): qty 1

## 2017-12-29 MED ORDER — VERAPAMIL HCL ER 180 MG PO TBCR
180.0000 mg | EXTENDED_RELEASE_TABLET | Freq: Every day | ORAL | Status: DC
Start: 2017-12-30 — End: 2017-12-31
  Administered 2017-12-30 – 2017-12-31 (×2): 180 mg via ORAL
  Filled 2017-12-29 (×2): qty 1

## 2017-12-29 MED ORDER — METHOCARBAMOL 1000 MG/10ML IJ SOLN
500.0000 mg | Freq: Once | INTRAVENOUS | Status: AC
  Administered 2017-12-29: 500 mg via INTRAVENOUS
  Filled 2017-12-29: qty 550

## 2017-12-29 MED ORDER — EPINEPHRINE PF 1 MG/ML IJ SOLN
INTRAMUSCULAR | Status: AC
  Filled 2017-12-29: qty 1

## 2017-12-29 MED ORDER — OXYCODONE HCL 5 MG PO TABS
5.0000 mg | ORAL_TABLET | Freq: Once | ORAL | Status: AC
  Administered 2017-12-29: 5 mg via ORAL
  Filled 2017-12-29: qty 1

## 2017-12-29 MED ORDER — ALPRAZOLAM 0.25 MG PO TABS: 0.2500 mg | ORAL_TABLET | Freq: Two times a day (BID) | ORAL | Status: DC | PRN

## 2017-12-29 MED ORDER — METOCLOPRAMIDE HCL 5 MG/ML IJ SOLN: 5.0000 mg | Freq: Three times a day (TID) | INTRAMUSCULAR | Status: DC | PRN

## 2017-12-29 MED ORDER — CHLORHEXIDINE GLUCONATE 4 % EX LIQD: 60.0000 mL | Freq: Once | CUTANEOUS | Status: DC

## 2017-12-29 MED ORDER — BUPIVACAINE-EPINEPHRINE (PF) 0.5% -1:200000 IJ SOLN
INTRAMUSCULAR | Status: AC
  Filled 2017-12-29: qty 30

## 2017-12-29 MED ORDER — BUPIVACAINE LIPOSOME 1.3 % IJ SUSP
INTRAMUSCULAR | Status: AC
  Filled 2017-12-29: qty 20

## 2017-12-29 MED ORDER — HYDROCHLOROTHIAZIDE 25 MG PO TABS
25.0000 mg | ORAL_TABLET | Freq: Every day | ORAL | Status: DC
  Administered 2017-12-29 – 2017-12-31 (×3): 25 mg via ORAL
  Filled 2017-12-29 (×3): qty 1

## 2017-12-29 MED ORDER — CEFAZOLIN SODIUM-DEXTROSE 2-4 GM/100ML-% IV SOLN
2.0000 g | Freq: Four times a day (QID) | INTRAVENOUS | Status: AC
  Administered 2017-12-29 (×2): 2 g via INTRAVENOUS
  Filled 2017-12-29 (×2): qty 100

## 2017-12-29 MED ORDER — HYDROCODONE-ACETAMINOPHEN 7.5-325 MG PO TABS
1.0000 | ORAL_TABLET | Freq: Four times a day (QID) | ORAL | Status: DC
  Administered 2017-12-29 – 2017-12-31 (×7): 1 via ORAL
  Filled 2017-12-29 (×7): qty 1

## 2017-12-29 MED ORDER — LIDOCAINE HCL (PF) 1 % IJ SOLN
INTRAMUSCULAR | Status: AC
  Filled 2017-12-29: qty 5

## 2017-12-29 MED ORDER — BUPIVACAINE IN DEXTROSE 0.75-8.25 % IT SOLN
INTRATHECAL | Status: AC
  Filled 2017-12-29: qty 2

## 2017-12-29 MED ORDER — METHOCARBAMOL 500 MG PO TABS: 500.0000 mg | ORAL_TABLET | Freq: Four times a day (QID) | ORAL | Status: DC | PRN

## 2017-12-29 MED ORDER — SODIUM CHLORIDE 0.9 % IR SOLN
Status: DC | PRN
  Administered 2017-12-29: 3000 mL

## 2017-12-29 MED ORDER — OXYCODONE HCL 5 MG PO TABS
10.0000 mg | ORAL_TABLET | ORAL | Status: DC | PRN
  Administered 2017-12-30: 10 mg via ORAL
  Filled 2017-12-29: qty 2

## 2017-12-29 MED ORDER — FENTANYL CITRATE (PF) 100 MCG/2ML IJ SOLN: 25.0000 ug | INTRAMUSCULAR | Status: DC | PRN

## 2017-12-29 MED ORDER — LORATADINE 10 MG PO TABS
10.0000 mg | ORAL_TABLET | Freq: Every day | ORAL | Status: DC
  Administered 2017-12-30 – 2017-12-31 (×2): 10 mg via ORAL
  Filled 2017-12-29 (×2): qty 1

## 2017-12-29 MED ORDER — ONDANSETRON HCL 4 MG/2ML IJ SOLN
4.0000 mg | Freq: Once | INTRAMUSCULAR | Status: AC
  Administered 2017-12-29: 4 mg via INTRAVENOUS
  Filled 2017-12-29: qty 2

## 2017-12-29 MED ORDER — CEFAZOLIN SODIUM-DEXTROSE 2-4 GM/100ML-% IV SOLN
2.0000 g | INTRAVENOUS | Status: AC
  Administered 2017-12-29: 2 g via INTRAVENOUS

## 2017-12-29 MED ORDER — SENNOSIDES-DOCUSATE SODIUM 8.6-50 MG PO TABS: 1.0000 | ORAL_TABLET | Freq: Every evening | ORAL | Status: DC | PRN

## 2017-12-29 MED ORDER — DULOXETINE HCL 60 MG PO CPEP
60.0000 mg | ORAL_CAPSULE | Freq: Every day | ORAL | Status: DC
  Administered 2017-12-30 – 2017-12-31 (×2): 60 mg via ORAL
  Filled 2017-12-29 (×2): qty 1

## 2017-12-29 MED ORDER — PRAVASTATIN SODIUM 10 MG PO TABS
20.0000 mg | ORAL_TABLET | Freq: Every day | ORAL | Status: DC
  Administered 2017-12-29 – 2017-12-30 (×2): 20 mg via ORAL
  Filled 2017-12-29 (×2): qty 2

## 2017-12-29 MED ORDER — DEXAMETHASONE SODIUM PHOSPHATE 4 MG/ML IJ SOLN
10.0000 mg | Freq: Once | INTRAMUSCULAR | Status: AC
  Administered 2017-12-30: 10 mg via INTRAVENOUS
  Filled 2017-12-29: qty 3

## 2017-12-29 SURGICAL SUPPLY — 72 items
BAG HAMPER (MISCELLANEOUS) ×2 IMPLANT
BANDAGE ELASTIC 4 LF NS (GAUZE/BANDAGES/DRESSINGS) ×2 IMPLANT
BANDAGE ELASTIC 6 LF NS (GAUZE/BANDAGES/DRESSINGS) ×1 IMPLANT
BANDAGE ESMARK 6X9 LF (GAUZE/BANDAGES/DRESSINGS) ×1 IMPLANT
BIT DRILL 3.2X128 (BIT) ×1 IMPLANT
BLADE HEX COATED 2.75 (ELECTRODE) ×2 IMPLANT
BLADE SAGITTAL 25.0X1.27X90 (BLADE) ×2 IMPLANT
BNDG CMPR 9X6 STRL LF SNTH (GAUZE/BANDAGES/DRESSINGS) ×1
BNDG CMPR MED 5X4 ELC HKLP NS (GAUZE/BANDAGES/DRESSINGS) ×2
BNDG CMPR MED 5X6 ELC HKLP NS (GAUZE/BANDAGES/DRESSINGS) ×1
BNDG ESMARK 6X9 LF (GAUZE/BANDAGES/DRESSINGS) ×2
CAP KNEE TOTAL 3 SIGMA ×1 IMPLANT
CEMENT HV SMART SET (Cement) ×4 IMPLANT
CLOTH BEACON ORANGE TIMEOUT ST (SAFETY) ×2 IMPLANT
COOLER CRYO CUFF IC AND MOTOR (MISCELLANEOUS) ×2 IMPLANT
COVER LIGHT HANDLE STERIS (MISCELLANEOUS) ×4 IMPLANT
CUFF CRYO KNEE18X23 MED (MISCELLANEOUS) ×1 IMPLANT
CUFF TOURNIQUET SINGLE 34IN LL (TOURNIQUET CUFF) ×1 IMPLANT
DECANTER SPIKE VIAL GLASS SM (MISCELLANEOUS) ×2 IMPLANT
DRAPE BACK TABLE (DRAPES) ×2 IMPLANT
DRAPE EXTREMITY T 121X128X90 (DRAPE) ×2 IMPLANT
DRESSING AQUACEL AG ADV 3.5X12 (MISCELLANEOUS) ×1 IMPLANT
DRSG AQUACEL AG ADV 3.5X12 (MISCELLANEOUS) ×2
DRSG MEPILEX BORDER 4X12 (GAUZE/BANDAGES/DRESSINGS) ×2 IMPLANT
DURAPREP 26ML APPLICATOR (WOUND CARE) ×4 IMPLANT
ELECT REM PT RETURN 9FT ADLT (ELECTROSURGICAL) ×2
ELECTRODE REM PT RTRN 9FT ADLT (ELECTROSURGICAL) ×1 IMPLANT
EVACUATOR 3/16  PVC DRAIN (DRAIN) ×1
EVACUATOR 3/16 PVC DRAIN (DRAIN) ×1 IMPLANT
GLOVE BIO SURGEON STRL SZ7 (GLOVE) ×5 IMPLANT
GLOVE BIOGEL PI IND STRL 6.5 (GLOVE) IMPLANT
GLOVE BIOGEL PI IND STRL 7.0 (GLOVE) ×2 IMPLANT
GLOVE BIOGEL PI INDICATOR 6.5 (GLOVE) ×2
GLOVE BIOGEL PI INDICATOR 7.0 (GLOVE) ×5
GLOVE SKINSENSE NS SZ8.0 LF (GLOVE) ×2
GLOVE SKINSENSE STRL SZ8.0 LF (GLOVE) ×2 IMPLANT
GLOVE SS N UNI LF 8.5 STRL (GLOVE) ×2 IMPLANT
GOWN STRL REUS W/ TWL LRG LVL3 (GOWN DISPOSABLE) ×1 IMPLANT
GOWN STRL REUS W/TWL LRG LVL3 (GOWN DISPOSABLE) ×6 IMPLANT
GOWN STRL REUS W/TWL XL LVL3 (GOWN DISPOSABLE) ×2 IMPLANT
HANDPIECE INTERPULSE COAX TIP (DISPOSABLE) ×2
HOOD W/PEELAWAY (MISCELLANEOUS) ×8 IMPLANT
INST SET MAJOR BONE (KITS) ×2 IMPLANT
IV NS IRRIG 3000ML ARTHROMATIC (IV SOLUTION) ×2 IMPLANT
KIT BLADEGUARD II DBL (SET/KITS/TRAYS/PACK) ×2 IMPLANT
KIT ROOM TURNOVER APOR (KITS) ×2 IMPLANT
MANIFOLD NEPTUNE II (INSTRUMENTS) ×2 IMPLANT
MARKER SKIN DUAL TIP RULER LAB (MISCELLANEOUS) ×2 IMPLANT
NDL HYPO 21X1.5 SAFETY (NEEDLE) ×1 IMPLANT
NEEDLE HYPO 21X1.5 SAFETY (NEEDLE) ×2 IMPLANT
NS IRRIG 1000ML POUR BTL (IV SOLUTION) ×2 IMPLANT
PACK TOTAL JOINT (CUSTOM PROCEDURE TRAY) ×2 IMPLANT
PAD ARMBOARD 7.5X6 YLW CONV (MISCELLANEOUS) ×2 IMPLANT
PAD DANNIFLEX CPM (ORTHOPEDIC SUPPLIES) ×2 IMPLANT
PIN TROCAR 3 INCH (PIN) IMPLANT
SAW OSC TIP CART 19.5X105X1.3 (SAW) ×2 IMPLANT
SET BASIN LINEN APH (SET/KITS/TRAYS/PACK) ×2 IMPLANT
SET HNDPC FAN SPRY TIP SCT (DISPOSABLE) ×1 IMPLANT
STAPLER VISISTAT 35W (STAPLE) ×2 IMPLANT
SUT BRALON NAB BRD #1 30IN (SUTURE) ×4 IMPLANT
SUT MNCRL 0 VIOLET CTX 36 (SUTURE) ×1 IMPLANT
SUT MON AB 0 CT1 (SUTURE) ×2 IMPLANT
SUT MON AB 2-0 CT1 36 (SUTURE) IMPLANT
SUT MONOCRYL 0 CTX 36 (SUTURE) ×1
SYR 20CC LL (SYRINGE) ×4 IMPLANT
SYR 30ML LL (SYRINGE) ×2 IMPLANT
SYR BULB IRRIGATION 50ML (SYRINGE) ×2 IMPLANT
TOWEL OR 17X26 4PK STRL BLUE (TOWEL DISPOSABLE) ×2 IMPLANT
TOWER CARTRIDGE SMART MIX (DISPOSABLE) ×2 IMPLANT
TRAY FOLEY W/METER SILVER 16FR (SET/KITS/TRAYS/PACK) ×2 IMPLANT
WATER STERILE IRR 1000ML POUR (IV SOLUTION) ×4 IMPLANT
YANKAUER SUCT 12FT TUBE ARGYLE (SUCTIONS) ×2 IMPLANT

## 2017-12-29 NOTE — Op Note (Signed)
The patient was identified by 2 approved identification mechanism. The operative extremity was evaluated and found to be acceptable for surgical treatment today. The chart was reviewed. The surgical site was confirmed and marked.  The patient was taken to the operating room and given ancef 2 gm for antibiotic. This is consistent with the SCIP medications.  The patient was given the following anesthetic: spinal  The patient was then placed supine on the operating table. A Foley catheter was inserted. The operative extremity right was prepped and draped sterilely from the toes to the groin.  Timeout procedure was executed confirming the patient's name, surgical site, antibiotic administration, x-rays available, and implants available.  The operative limb right,  was exsanguinated with a six-inch Esmarch and the tourniquet was inflated to 300 mmHg.  A straight midline incision was made and taken down to the extensor mechanism. A medial arthrotomy was performed. The patella was everted and the patellofemoral ligament was released. The anterior cruciate ligament and PCL were resected.  The anterior horns of the lateral and medial meniscus were resected. The medial soft tissue sleeve was elevated to the mid coronal plane.  A three-eighths inch drill bit was used to enter the femoral canal which was decompressed with suction and irrigation until clear. The distal femoral cutting guide was set for 11 mm distal resection,  5valgus alignment, for a right knee. The distal femur was resected and checked for flatness.  The Depuy Sigma sizing femoral guide was placed and the femur was sized to a size 2 . A 4-in-1 cutting block was placed along with collateral ligament retractors and the distal femoral cuts were completed.  The external alignment guide for the tibial resection was then applied to the distal and proximal tibia and set for anatomic slope along with 4 MM resection  from the  Medial side .    Rotational alignment was set using the malleolus, the tibial tubercle and the tibial spines.  The proximal tibia was resected residual menisci were removed. The tibia was sized using a base plate to a size  2.5 .   Spacer blocks were used to confirm equal flexion extension gaps with releases done as needed. A size 15 MM spacer block gave equal stability and flexion extension.  The notch cutting guide for the femur was then applied and the notch cut was made.  Trial reduction was completed using 29f 2.5t  trial implants. Patella tracking was normal  We then skeletonized the patella. It measured 23 in thickness and the patellar resection was set for 13 milli meters. the patellar resection was completed. The patella diameter measured 35. We then drilled the peg holes for the patella  The proximal tibia was prepared using the size 2.5 base plate.  Thorough irrigation was performed and the bone was dried and prepared for cement. The cement was mixed on the back table using third generation preparation techniques  60 cc of dilute Exparel was injected into the soft tissues including the posterior capsule.  The implants were then cemented in place and excess cement was removed. The cement was allowed to cure. Irrigation was repeated and excess and residual bone fragments and cement were removed.  A medium-size Hemovac was placed in the joint with 6 openings in the tube left in the joint.  The extensor mechanism was closed with #1 Bralon suture followed by subcutaneous tissue closure using 0 Monocryl suture  30 cc of Marcaine with epinephrine was injected into the joint  Skin approximation  was performed using staples  A sterile dressing was applied, followed by a TED hose and then a Cryo/Cuff which was activated   The patient was taken recovery room in stable condition

## 2017-12-29 NOTE — Transfer of Care (Signed)
Immediate Anesthesia Transfer of Care Note  Patient: ADALY PUDER  Procedure(s) Performed: TOTAL KNEE ARTHROPLASTY-right knee (Right Knee)  Patient Location: PACU  Anesthesia Type:Spinal  Level of Consciousness: awake and patient cooperative  Airway & Oxygen Therapy: Patient Spontanous Breathing and Patient connected to face mask oxygen  Post-op Assessment: Report given to RN and Post -op Vital signs reviewed and stable  Post vital signs: Reviewed and stable  Last Vitals:  Vitals:   12/29/17 0700 12/29/17 0715  BP: 129/81 129/68  Resp: 19 19  Temp:    SpO2: 96% 94%    Last Pain:  Vitals:   12/29/17 0646  TempSrc: Oral  PainSc: 6       Patients Stated Pain Goal: 8 (94/70/76 1518)  Complications: No apparent anesthesia complications

## 2017-12-29 NOTE — Anesthesia Preprocedure Evaluation (Signed)
Anesthesia Evaluation  Patient identified by MRN, date of birth, ID band Patient awake    Reviewed: Allergy & Precautions, H&P , NPO status , Patient's Chart, lab work & pertinent test results  History of Anesthesia Complications (+) history of anesthetic complications (rash from unknown medication)  Airway Mallampati: II  TM Distance: >3 FB Neck ROM: Full    Dental no notable dental hx. (+) Edentulous Upper   Pulmonary sleep apnea and Continuous Positive Airway Pressure Ventilation ,    Pulmonary exam normal breath sounds clear to auscultation       Cardiovascular hypertension, Pt. on medications +CHF  Normal cardiovascular exam Rhythm:Regular Rate:Normal     Neuro/Psych PSYCHIATRIC DISORDERS Anxiety Depression  Neuromuscular disease negative neurological ROS     GI/Hepatic Neg liver ROS, hiatal hernia, GERD  ,  Endo/Other  negative endocrine ROS  Renal/GU negative Renal ROS     Musculoskeletal  (+) Arthritis , Fibromyalgia -  Abdominal   Peds negative pediatric ROS (+)  Hematology negative hematology ROS (+)   Anesthesia Other Findings Lumbar Fusion  Reproductive/Obstetrics                             Anesthesia Physical Anesthesia Plan  ASA: II  Anesthesia Plan: Spinal   Post-op Pain Management:    Induction: Intravenous  PONV Risk Score and Plan:   Airway Management Planned: Simple Face Mask  Additional Equipment:   Intra-op Plan:   Post-operative Plan:   Informed Consent: I have reviewed the patients History and Physical, chart, labs and discussed the procedure including the risks, benefits and alternatives for the proposed anesthesia with the patient or authorized representative who has indicated his/her understanding and acceptance.     Plan Discussed with: CRNA and Surgeon  Anesthesia Plan Comments: (Discussed SAB with possible GOT if unable due to lumbar  fusion. Pt agrees with this plan.)        Anesthesia Quick Evaluation

## 2017-12-29 NOTE — Plan of Care (Signed)
  Acute Rehab PT Goals(only PT should resolve) Pt Will Go Supine/Side To Sit 12/29/2017 1524 - Progressing by Lonell Grandchild, PT Flowsheets Taken 12/29/2017 1524  Pt will go Supine/Side to Sit with modified independence Patient Will Transfer Sit To/From Stand 12/29/2017 1524 - Progressing by Lonell Grandchild, PT Flowsheets Taken 12/29/2017 1524  Patient will transfer sit to/from stand with modified independence Pt Will Transfer Bed To Chair/Chair To Bed 12/29/2017 1524 - Progressing by Lonell Grandchild, PT Flowsheets Taken 12/29/2017 1524  Pt will Transfer Bed to Chair/Chair to Bed with modified independence Pt Will Ambulate 12/29/2017 1524 - Progressing by Lonell Grandchild, PT Flowsheets Taken 12/29/2017 1524  Pt will Ambulate with modified independence;75 feet;with rolling walker Pt Will Go Up/Down Stairs 12/29/2017 1524 - Progressing by Lonell Grandchild, PT Flowsheets Taken 12/29/2017 1524  Pt will Go Up / Down Stairs 6-9 stairs;with supervision;with rail(s)  3:25 PM, 12/29/17 Lonell Grandchild, MPT Physical Therapist with [facility] [phone] office [phone] mobile phone

## 2017-12-29 NOTE — Progress Notes (Signed)
Pt has home CPAP on with no O2 in line. O2 saturation was 77%. Pt in no distress. Placed 4L O2 in line with home CPAP and O2 saturation increased to 92%

## 2017-12-29 NOTE — Anesthesia Procedure Notes (Signed)
Spinal  Patient location during procedure: OR Start time: 12/29/2017 7:53 AM Staffing Resident/CRNA: Charmaine Downs, CRNA Preanesthetic Checklist Completed: patient identified, site marked, surgical consent, pre-op evaluation, timeout performed, IV checked, risks and benefits discussed and monitors and equipment checked Spinal Block Patient position: right lateral decubitus Prep: Betadine and DuraPrep Patient monitoring: heart rate, cardiac monitor, continuous pulse ox and blood pressure Approach: right paramedian Location: L2-3 Injection technique: single-shot Needle Needle type: Sprotte  Needle gauge: 22 G Needle length: 9 cm Assessment Sensory level: T4 Additional Notes [id number]      03 Feb 2020

## 2017-12-29 NOTE — Evaluation (Signed)
Physical Therapy Evaluation Patient Details Name: Emily [name] MRN: [id number] DOB: 1951-04-11 Today's Date: 12/29/2017  RIGHT KNEE  PROM: 5-95 degrees AMBULATION DISTANCE: 25 feet    History of Present Illness  Emily [name] is a 67 y/o female, s/p Right TKA, 12/29/17 with the diagnosis of PRIMARY OSTEOARTHRITIS KNEE   Clinical Impression  Patient demonstrates slightly labored movement for sitting up at bedside and ambulating with RW up to doorway and back to bedside, limited secondary to c/o fatigue and lethargy and tolerated sitting up in chair after therapy with spouse present at bedside.  Patient will benefit from continued physical therapy in hospital and recommended venue below to increase strength, balance, endurance for safe ADLs and gait.    Follow Up Recommendations Home health PT;Supervision for mobility/OOB    Equipment Recommendations  None recommended by PT    Recommendations for Other Services       Precautions / Restrictions Precautions Precautions: Fall Restrictions Weight Bearing Restrictions: Yes RLE Weight Bearing: Weight bearing as tolerated Other Position/Activity Restrictions: no pillows underneath right knee when in bed      Mobility  Bed Mobility Overal bed mobility: Needs Assistance Bed Mobility: Supine to Sit;Sit to Supine     Supine to sit: Min guard Sit to supine: Min guard   General bed mobility comments: slightly labored movement  Transfers Overall transfer level: Needs assistance Equipment used: Rolling walker (2 wheeled) Transfers: Sit to/from [facility] Sit to Stand: Min guard Stand pivot transfers: Min guard          Ambulation/Gait Ambulation/Gait assistance: Min guard Ambulation Distance (Feet): 25 Feet Assistive device: Rolling walker (2 wheeled) Gait Pattern/deviations: Decreased step length - right;Decreased stance time - right;Decreased stride length;Antalgic   Gait velocity interpretation:  Below normal speed for age/gender General Gait Details: slightly labored cadence with antalgic gait on RLE, limited right heel to toe stepping, no loss of balance, limited secondary to c/o fatigue and lethargy  Stairs            Wheelchair Mobility    Modified Rankin (Stroke Patients Only)       Balance Overall balance assessment: Needs assistance Sitting-balance support: No upper extremity supported;Feet supported Sitting balance-Leahy Scale: Good     Standing balance support: Bilateral upper extremity supported;During functional activity Standing balance-Leahy Scale: Fair                               Pertinent Vitals/Pain Pain Assessment: 0-10 Pain Score: 5  Pain Location: right knee Pain Descriptors / Indicators: Aching Pain Intervention(s): Limited activity within patient's tolerance;Monitored during session    [place] expects to be discharged to:: Private residence Living Arrangements: Spouse/significant other Available Help at Discharge: Family Type of Home: House Home Access: Stairs to enter Entrance Stairs-Rails: Left;Right(to wide to reach both) Technical brewer of Steps: 6 Home Layout: Two level Home Equipment: Shower seat - built in;Walker - 2 wheels;Cane - single point      Prior Function Level of Independence: Independent with assistive device(s)         Comments: Community gait with SPC     Hand Dominance        Extremity/Trunk Assessment   Upper Extremity Assessment Upper Extremity Assessment: Defer to OT evaluation    Lower Extremity Assessment Lower Extremity Assessment: Generalized weakness;RLE deficits/detail;LLE deficits/detail RLE Deficits / Details: grossly 3/5 LLE Deficits / Details: grossly 5/5    Cervical /  Trunk Assessment Cervical / Trunk Assessment: Normal  Communication   Communication: No difficulties  Cognition Arousal/Alertness: Awake/alert;Lethargic(slightly lethargic  after surgery) Behavior During Therapy: WFL for tasks assessed/performed Overall Cognitive Status: Within Functional Limits for tasks assessed                                        General Comments      Exercises Total Joint Exercises Ankle Circles/Pumps: Supine;AROM;Strengthening;Both;10 reps Quad Sets: Supine;AROM;Strengthening;Both;10 reps Gluteal Sets: Supine;AROM;Strengthening;Both;10 reps Short Arc Quad: Supine;AROM;Strengthening;Right;10 reps Heel Slides: Supine;AROM;Strengthening;Right;10 reps Goniometric ROM: RIGHT KNEE: 5-90 degrees   Assessment/Plan    PT Assessment Patient needs continued PT services  PT Problem List Decreased strength;Decreased range of motion;Decreased activity tolerance;Decreased balance;Decreased mobility       PT Treatment Interventions Gait training;Stair training;Functional mobility training;Therapeutic activities;Therapeutic exercise;Patient/family education    PT Goals (Current goals can be found in the Care Plan section)  Acute Rehab PT Goals Patient Stated Goal: go home with spouse to assist PT Goal Formulation: With patient/family Time For Goal Achievement: 01/01/18 Potential to Achieve Goals: Good    Frequency 7X/week   Barriers to discharge        Co-evaluation               AM-PAC PT "6 Clicks" Daily Activity  Outcome Measure Difficulty turning over in bed (including adjusting bedclothes, sheets and blankets)?: A Little Difficulty moving from lying on back to sitting on the side of the bed? : A Little Difficulty sitting down on and standing up from a chair with arms (e.g., wheelchair, bedside commode, etc,.)?: A Little Help needed moving to and from a bed to chair (including a wheelchair)?: A Little Help needed walking in hospital room?: A Little Help needed climbing 3-5 steps with a railing? : A Little 6 Click Score: 18    End of Session   Activity Tolerance: Patient tolerated treatment  well;Patient limited by fatigue;Patient limited by lethargy Patient left: in chair;with call bell/phone within reach;with family/visitor present Nurse Communication: Mobility status PT Visit Diagnosis: Unsteadiness on feet (R26.81);Other abnormalities of gait and mobility (R26.89);Muscle weakness (generalized) (M62.81)    Time: 2774-1287 PT Time Calculation (min) (ACUTE ONLY): 38 min   Charges:   PT Evaluation $PT Eval Moderate Complexity: 1 Mod PT Treatments $Therapeutic Exercise: 8-22 mins $Therapeutic Activity: 8-22 mins   PT G Codes:        3:22 PM, January 19, 2018 Lonell Grandchild, MPT Physical Therapist with [facility] [phone] office [phone] mobile phone

## 2017-12-29 NOTE — Interval H&P Note (Signed)
History and Physical Interval Note:  12/29/2017 7:25 AM  BP 129/68   Temp 98.9 F (37.2 C) (Oral)   Resp 19   SpO2 94%   CBC Latest Ref Rng & Units 12/26/2017 02/17/2017 06/01/2016  WBC 4.0 - 10.5 K/uL 5.5 - 6.0  Hemoglobin 12.0 - 15.0 g/dL 13.8 14.7 13.2  Hematocrit 36.0 - 46.0 % 39.7 - 39.1  Platelets 150 - 400 K/uL 239 - 262    Emily [name]  has presented today for surgery, with the diagnosis of PRIMARY OSTEOARTHRITIS KNEE  The various methods of treatment have been discussed with the patient and family. After consideration of risks, benefits and other options for treatment, the patient has consented to  Procedure(s): TOTAL KNEE ARTHROPLASTY-right knee (Right) as a surgical intervention .  The patient's history has been reviewed, patient examined, no change in status, stable for surgery.  I have reviewed the patient's chart and labs.  Questions were answered to the patient's satisfaction.     Arther Abbott

## 2017-12-29 NOTE — Anesthesia Postprocedure Evaluation (Signed)
Anesthesia Post Note  Patient: Emily [name]  Procedure(s) Performed: TOTAL KNEE ARTHROPLASTY-right knee (Right Knee)  Patient location during evaluation: PACU Anesthesia Type: Spinal Level of consciousness: awake and alert and patient cooperative Pain management: pain level controlled Vital Signs Assessment: post-procedure vital signs reviewed and stable Respiratory status: spontaneous breathing, nonlabored ventilation and respiratory function stable Cardiovascular status: blood pressure returned to baseline Postop Assessment: no apparent nausea or vomiting, patient able to bend at knees and spinal receding Anesthetic complications: no     Last Vitals:  Vitals:   12/29/17 1015 12/29/17 1030  BP: 104/65 103/64  Pulse: 71 72  Resp: 16 15  Temp:    SpO2: 97% 91%    Last Pain:  Vitals:   12/29/17 0950  TempSrc:   PainSc: 0-No pain                 [name]

## 2017-12-29 NOTE — Brief Op Note (Signed)
12/29/2017  9:47 AM  PATIENT:  [name]  67 y.o. female  PRE-OPERATIVE DIAGNOSIS:  PRIMARY OSTEOARTHRITIS KNEE  POST-OPERATIVE DIAGNOSIS:  PRIMARY OSTEOARTHRITIS KNEE  PROCEDURE:  Procedure(s): TOTAL KNEE ARTHROPLASTY-right knee (Right)  SURGEON:  Surgeon(s) and Role:    * [name], MD - Primary  PHYSICIAN ASSISTANT:   ASSISTANTS: betty and cynthia   ANESTHESIA:   spinal  EBL:  25 mL   BLOOD ADMINISTERED:none  DRAINS: hemovac   LOCAL MEDICATIONS USED:  OTHER marcaine with epi 30 and exparel 20   SPECIMEN:  No Specimen  DISPOSITION OF SPECIMEN:  N/A  COUNTS:  YES  TOURNIQUET:   Total Tourniquet Time Documented: Thigh (Right) - 73 minutes Total: Thigh (Right) - 73 minutes   DICTATION: .Viviann Spare Dictation  PLAN OF CARE: Admit to inpatient   PATIENT DISPOSITION:  PACU - hemodynamically stable.   Delay start of Pharmacological VTE agent (>24hrs) due to surgical blood loss or risk of bleeding: yes

## 2017-12-30 LAB — BASIC METABOLIC PANEL
ANION GAP: 9 (ref 5–15)
BUN: 12 mg/dL (ref 6–20)
CALCIUM: 8.2 mg/dL — AB (ref 8.9–10.3)
CO2: 29 mmol/L (ref 22–32)
CREATININE: 0.7 mg/dL (ref 0.44–1.00)
Chloride: 98 mmol/L — ABNORMAL LOW (ref 101–111)
GFR calc Af Amer: >60 mL/min (ref >60)
GFR calc non Af Amer: >60 mL/min (ref >60)
GLUCOSE: 108 mg/dL — AB (ref 65–99)
Potassium: 3.6 mmol/L (ref 3.5–5.1)
Sodium: 136 mmol/L (ref 135–145)

## 2017-12-30 LAB — CBC
HCT: 32.5 % — ABNORMAL LOW (ref 36.0–46.0)
Hemoglobin: 10.9 g/dL — ABNORMAL LOW (ref 12.0–15.0)
MCH: 31.7 pg (ref 26.0–34.0)
MCHC: 33.5 g/dL (ref 30.0–36.0)
MCV: 94.5 fL (ref 78.0–100.0)
Platelets: 177 10*3/uL (ref 150–400)
RBC: 3.44 MIL/uL — ABNORMAL LOW (ref 3.87–5.11)
RDW: 13 % (ref 11.5–15.5)
WBC: 8.6 10*3/uL (ref 4.0–10.5)

## 2017-12-30 NOTE — Progress Notes (Signed)
OT Cancellation Note  Patient Details Name: Emily [name] MRN: [id number] DOB: 1951/07/23   Cancelled Treatment:    Reason Eval/Treat Not Completed: OT screened, no needs identified, will sign off. Chart reviewed, pt screened for OT needs. Pt requires increased level of assistance for ADLs due to pain and weakness of RLE limiting independence. Pt is mod to max assist with LB dressing tasks, is able to complete seated tasks with set-up. Pt's husband will be available 24/7 to assist with ADLs on discharge. No further OT services required at this time.   Guadelupe Sabin, OTR/L  [phone] 12/30/2017, 8:25 AM

## 2017-12-30 NOTE — Care Management Note (Addendum)
Case Management Note  Patient Details  Name: Emily [name] MRN: [id number] Date of Birth: 1951-07-14  Subjective/Objective:   S/p TKA. From home with family. Recommended for [facility] PT. Orders for [facility] have been sent to [facility] by MD office. Patient is agreeable to [facility]. Tim of [facility] has referral and aware patient will DC tomorrow. Order for CPM has been sent to [employer]. It will be delivered to patient's home today.  Patient need a toliet riser or BSC. She would like to use [facility] for this DME. Juliann Pulse of [facility] notified and will deliver to room prior to DC.  Patient has RW pta.                Action/Plan: DC home with home health PT.    Expected Discharge Date:    12/31/2017              Expected Discharge Plan:  [place]  In-House Referral:     Discharge planning Services  CM Consult  Post Acute Care Choice:  Home Health, Durable Medical Equipment Choice offered to:  Patient  DME Arranged:  Bedside commode, Continuous passive motion machine DME Agency:  [place] [employer] [facility]  HH Arranged:  PT [place]:  [facility]  Status of Service:  Completed, signed off  If discussed at [place] of Stay Meetings, dates discussed:    Additional Comments:  Kendarrius Tanzi, Chauncey Reading, RN 12/30/2017, 9:07 AM

## 2017-12-30 NOTE — Progress Notes (Addendum)
Physical Therapy Treatment Patient Details Name: Emily [name] MRN: [id number] DOB: Jan 09, 1951 Today's Date: 12/30/2017  RIGHT KNEE  PROM: 3-92 degrees AMBULATION DISTANCE: 75 feet CPM ROM: 0-90 degrees     History of Present Illness RELDA [name] is a 67 y/o female, s/p Right TKA, 12/29/17 with the diagnosis of PRIMARY OSTEOARTHRITIS KNEE     PT Comments    Patient demonstrates increased endurance/distance for gait training with fair/good return for right heel toe stepping without loss of balance.  Patient tolerated sitting up in chair for approximately 1 hour and tolerated CPM set up at 0-90 degrees without c/o increased pain.  Patient will benefit from continued physical therapy in hospital and recommended venue below to increase strength, balance, endurance for safe ADLs and gait.  Patient will benefit from use of BSC and raised toilet seat for home use to improve safety for transfers due to s/p Right TKA.   Follow Up Recommendations  Home health PT;Supervision for mobility/OOB     Equipment Recommendations  3in1 (PT)(bedside commode and toilet riser)    Recommendations for Other Services       Precautions / Restrictions Precautions Precautions: Fall Restrictions Weight Bearing Restrictions: Yes RLE Weight Bearing: Weight bearing as tolerated Other Position/Activity Restrictions: no pillows underneath right knee when in bed    Mobility  Bed Mobility Overal bed mobility: Needs Assistance Bed Mobility: Supine to Sit;Sit to Supine     Supine to sit: Supervision Sit to supine: Min guard   General bed mobility comments: slightly labored movement  Transfers Overall transfer level: Needs assistance Equipment used: Rolling walker (2 wheeled) Transfers: Sit to/from [facility] Sit to Stand: Supervision Stand pivot transfers: Supervision          Ambulation/Gait Ambulation/Gait assistance: Supervision Ambulation Distance (Feet): 75  Feet Assistive device: Rolling walker (2 wheeled) Gait Pattern/deviations: Decreased step length - right;Decreased stance time - right;Decreased stride length;Antalgic   Gait velocity interpretation: Below normal speed for age/gender General Gait Details: slightly labored slow cadence with fair/good return for right heel to toe stepping, no loss of balance, limited secondary to increasing right knee pain   Stairs            Wheelchair Mobility    Modified Rankin (Stroke Patients Only)       Balance Overall balance assessment: Needs assistance Sitting-balance support: No upper extremity supported;Feet supported Sitting balance-Leahy Scale: Good     Standing balance support: Bilateral upper extremity supported;During functional activity Standing balance-Leahy Scale: Fair Standing balance comment: fair/good with RW                            Cognition Arousal/Alertness: Awake/alert Behavior During Therapy: WFL for tasks assessed/performed Overall Cognitive Status: Within Functional Limits for tasks assessed                                        Exercises Total Joint Exercises Ankle Circles/Pumps: Supine;AROM;Strengthening;Both;10 reps Quad Sets: Supine;AROM;Strengthening;Both;10 reps Short Arc Quad: Supine;AROM;Strengthening;Right;10 reps Heel Slides: Supine;AROM;Strengthening;Right;10 reps    General Comments        Pertinent Vitals/Pain Pain Score: 7  Pain Location: right knee Pain Descriptors / Indicators: Aching Pain Intervention(s): Limited activity within patient's tolerance;Monitored during session;RN gave pain meds during session    Home Living  Prior Function            PT Goals (current goals can now be found in the care plan section) Acute Rehab PT Goals Patient Stated Goal: go home with spouse to assist PT Goal Formulation: With patient/family Time For Goal Achievement:  01/01/18 Potential to Achieve Goals: Good Progress towards PT goals: Progressing toward goals    Frequency    7X/week      PT Plan Current plan remains appropriate    Co-evaluation              AM-PAC PT "6 Clicks" Daily Activity  Outcome Measure  Difficulty turning over in bed (including adjusting bedclothes, sheets and blankets)?: A Little Difficulty moving from lying on back to sitting on the side of the bed? : A Little Difficulty sitting down on and standing up from a chair with arms (e.g., wheelchair, bedside commode, etc,.)?: A Little Help needed moving to and from a bed to chair (including a wheelchair)?: A Little Help needed walking in hospital room?: A Little Help needed climbing 3-5 steps with a railing? : A Little 6 Click Score: 18    End of Session   Activity Tolerance: Patient tolerated treatment well;Patient limited by pain Patient left: in chair;with call bell/phone within reach;with family/visitor present Nurse Communication: Mobility status PT Visit Diagnosis: Unsteadiness on feet (R26.81);Other abnormalities of gait and mobility (R26.89);Muscle weakness (generalized) (M62.81)     Time: 0920-0950 PT Time Calculation (min) (ACUTE ONLY): 30 min  Charges:  $Gait Training: 8-22 mins $Therapeutic Exercise: 8-22 mins                    G Codes:       2:26 PM, 01/05/18 Emily [name], MPT Physical Therapist with [facility] [phone] office [phone] mobile phone

## 2017-12-30 NOTE — Anesthesia Postprocedure Evaluation (Signed)
Anesthesia Post Note  Patient: Emily [name]  Procedure(s) Performed: TOTAL KNEE ARTHROPLASTY-right knee (Right Knee)  Patient location during evaluation: Nursing Unit Anesthesia Type: Spinal Level of consciousness: awake and alert and oriented Pain management: pain level controlled Vital Signs Assessment: post-procedure vital signs reviewed and stable Respiratory status: spontaneous breathing Cardiovascular status: blood pressure returned to baseline and stable Postop Assessment: adequate PO intake Anesthetic complications: no     Last Vitals:  Vitals:   12/29/17 2046 12/30/17 0550  BP:  106/65  Pulse:  87  Resp:  18  Temp:  36.8 C  SpO2: 92% 93%    Last Pain:  Vitals:   12/30/17 0550  TempSrc: Oral  PainSc:                  [name]

## 2017-12-30 NOTE — Progress Notes (Signed)
Subjective: 1 Day Post-Op Procedure(s) (LRB): TOTAL KNEE ARTHROPLASTY-right knee (Right) Patient reports pain as moderate.    Objective: Vital signs in last 24 hours: Temp:  [98 F (36.7 C)-98.9 F (37.2 C)] 98.3 F (36.8 C) (01/25 0550) Pulse Rate:  [70-90] 87 (01/25 0550) Resp:  [13-22] 18 (01/25 0550) BP: (103-132)/(57-78) 106/65 (01/25 0550) SpO2:  [77 %-100 %] 93 % (01/25 0550)  Intake/Output from previous day: 01/24 0701 - 01/25 0700 In: 3365 [P.O.:120; I.V.:2740; IV Piggyback:155] Out: 6301 [Urine:1300; Stool:300; Blood:25] Intake/Output this shift: No intake/output data recorded.  Recent Labs    12/30/17 0518  HGB 10.9*   Recent Labs    12/30/17 0518  WBC 8.6  RBC 3.44*  HCT 32.5*  PLT 177   Recent Labs    12/30/17 0518  NA 136  K 3.6  CL 98*  CO2 29  BUN 12  CREATININE 0.70  GLUCOSE 108*  CALCIUM 8.2*   No results for input(s): LABPT, INR in the last 72 hours.  Neurologically intact ABD soft Neurovascular intact Sensation intact distally Intact pulses distally Dorsiflexion/Plantar flexion intact Incision: scant drainage  Assessment/Plan: 1 Day Post-Op Procedure(s) (LRB): TOTAL KNEE ARTHROPLASTY-right knee (Right) Advance diet Up with therapy D/C IV fluids Plan for discharge tomorrow Discharge home with home health  Arther Abbott 12/30/2017, 7:50 AM

## 2017-12-30 NOTE — Addendum Note (Signed)
Addendum  created 12/30/17 1117 by Ollen Bowl, CRNA   Sign clinical note

## 2017-12-30 NOTE — Clinical Social Work Note (Addendum)
Patient is going home with HHPT.  Reconsult if CSW issues arise.   LCSW signing off.    Ariatna Jester, Clydene Pugh, LCSW

## 2017-12-30 NOTE — Care Management Important Message (Addendum)
Important Message  Patient Details  Name: Emily [name] MRN: [id number] Date of Birth: 02-05-1951   Medicare Important Message Given:  Yes    Jesseca Marsch, Chauncey Reading, RN 12/30/2017, 9:18 AM

## 2017-12-31 LAB — CBC
HCT: 31.5 % — ABNORMAL LOW (ref 36.0–46.0)
Hemoglobin: 10.6 g/dL — ABNORMAL LOW (ref 12.0–15.0)
MCH: 32 pg (ref 26.0–34.0)
MCHC: 33.7 g/dL (ref 30.0–36.0)
MCV: 95.2 fL (ref 78.0–100.0)
Platelets: 165 10*3/uL (ref 150–400)
RBC: 3.31 MIL/uL — AB (ref 3.87–5.11)
RDW: 13 % (ref 11.5–15.5)
WBC: 14.7 10*3/uL — ABNORMAL HIGH (ref 4.0–10.5)

## 2017-12-31 MED ORDER — ASPIRIN 81 MG PO CHEW: 81.0000 mg | CHEWABLE_TABLET | Freq: Two times a day (BID) | ORAL | 0 refills | Status: DC

## 2017-12-31 MED ORDER — DOCUSATE SODIUM 100 MG PO CAPS: 100.0000 mg | ORAL_CAPSULE | Freq: Two times a day (BID) | ORAL | 0 refills | Status: DC

## 2017-12-31 MED ORDER — TOILET SEAT ELEVATOR MISC: 0 refills | Status: DC

## 2017-12-31 MED ORDER — DIPHENHYDRAMINE HCL 25 MG PO TABS: 25.0000 mg | ORAL_TABLET | ORAL | 5 refills | Status: AC

## 2017-12-31 MED ORDER — HYDROCODONE-ACETAMINOPHEN 7.5-325 MG PO TABS: 1.0000 | ORAL_TABLET | ORAL | 0 refills | Status: DC | PRN

## 2017-12-31 MED ORDER — METHOCARBAMOL 500 MG PO TABS: 500.0000 mg | ORAL_TABLET | Freq: Four times a day (QID) | ORAL | 1 refills | Status: DC | PRN

## 2017-12-31 NOTE — Discharge Summary (Signed)
Physician Discharge Summary  Patient ID: Emily [name] MRN: [id number] DOB/AGE: [birth year] 66 y.o.  Admit date: 12/29/2017 Discharge date: 12/31/2017  Admission Diagnoses: Primary osteoarthritis right knee  Discharge Diagnoses: Same  Discharged Condition: good  Procedure: Right total knee arthroplasty, [employer] posterior stabilized implant, fixed-bearing  Hospital Course:  Hospital day 1 on January 24 patient underwent uncomplicated right total knee arthroplasty under spinal anesthesia with no complications.  On hospital day 2 January 25 she was able to tolerate her physical therapy and progressively advanced to 75 feet gait and 3-90 degrees of her motion in the right knee  Hospital day 3 Uvaldo Bristle she was afebrile her incision was clean dry and intact she had good pain control and was discharged home.  CBC Latest Ref Rng & Units 12/31/2017 12/30/2017 12/26/2017  WBC 4.0 - 10.5 K/uL 14.7(H) 8.6 5.5  Hemoglobin 12.0 - 15.0 g/dL 10.6(L) 10.9(L) 13.8  Hematocrit 36.0 - 46.0 % 31.5(L) 32.5(L) 39.7  Platelets 150 - 400 K/uL 165 177 239   BMP Latest Ref Rng & Units 12/30/2017 12/26/2017 06/20/2017  Glucose 65 - 99 mg/dL 108(H) 122(H) 97  BUN 6 - 20 mg/dL 12 8 12   Creatinine 0.44 - 1.00 mg/dL 0.70 0.72 0.72  BUN/Creat Ratio 12 - 28 - - 17  Sodium 135 - 145 mmol/L 136 137 144  Potassium 3.5 - 5.1 mmol/L 3.6 3.5 4.2  Chloride 101 - 111 mmol/L 98(L) 101 100  CO2 22 - 32 mmol/L 29 25 23   Calcium 8.9 - 10.3 mg/dL 8.2(L) 9.1 9.7     Discharge Exam: Blood pressure (!) 105/56, pulse 73, temperature 98.1 F (36.7 C), temperature source Oral, resp. rate 18, height 5\' 2"  (1.575 m), SpO2 94 %.   Disposition: 01-Home or Self Care  Discharge Instructions    CPM   Complete by:  As directed    CPM machine for 2 weeks use 6-8 hours/day you can break it up into 2-3 sessions per day start at 0-90 degrees increase 10 degrees/day   Call MD / Call 911   Complete by:  As directed    If you  experience chest pain or shortness of breath, CALL 911 and be transported to the hospital emergency room.  If you develope a fever above 101 F, pus (white drainage) or increased drainage or redness at the wound, or calf pain, call your surgeon's office.   Change dressing   Complete by:  As directed    you do not need to change the dressing   Constipation Prevention   Complete by:  As directed    Drink plenty of fluids.  Prune juice may be helpful.  You may use a stool softener, such as Colace (over the counter) 100 mg twice a day.  Use MiraLax (over the counter) for constipation as needed.   DME Bedside commode   Complete by:  As directed    Patient needs a bedside commode to treat with the following condition:  H/O total knee replacement   Diet - low sodium heart healthy   Complete by:  As directed    Do not put a pillow under the knee. Place it under the heel.   Complete by:  As directed    Driving restrictions   Complete by:  As directed    No driving for 2 weeks   Face-to-face encounter (required for Medicare/Medicaid patients)   Complete by:  As directed    I Arther Abbott certify that this patient is  under my care and that I, or a nurse practitioner or physician's assistant working with me, had a face-to-face encounter that meets the physician face-to-face encounter requirements with this patient on 12/31/2017. The encounter with the patient was in whole, or in part for the following medical condition(s) which is the primary reason for home health care (List medical condition): Osteoarthritis primary right knee, status post right total knee arthroplasty   The encounter with the patient was in whole, or in part, for the following medical condition, which is the primary reason for home health care:  Right total knee arthroplasty   I certify that, based on my findings, the following services are medically necessary home health services:  Physical therapy   Reason for Medically Necessary  Home Health Services:  Therapy- Personnel officer, Public librarian   My clinical findings support the need for the above services:  Pain interferes with ambulation/mobility   Further, I certify that my clinical findings support that this patient is homebound due to:  Unsafe ambulation due to balance issues   Home Health   Complete by:  As directed    To provide the following care/treatments:  PT   Increase activity slowly as tolerated   Complete by:  As directed    TED hose   Complete by:  As directed    Stockings both legs for 2 weeks     Allergies as of 12/31/2017      Reactions   Codeine Itching   Oxycodone-acetaminophen Itching   Sulfamethoxazole-trimethoprim Hives   Celebrex [celecoxib] Other (See Comments)   Homicidal thoughts.   Hydrocodone-acetaminophen Itching, Other (See Comments)   Takes with benadryl   Losartan Other (See Comments)   Skin on arms got loose   Lyrica [pregabalin] Other (See Comments)   When combined with narcotics will cause SpO2 to drop   Tramadol Itching      Medication List    STOP taking these medications   acetaminophen 500 MG tablet Commonly known as:  TYLENOL     TAKE these medications   ALPRAZolam 0.5 MG tablet Commonly known as:  XANAX 1/2-1 BID prn for anxiety,caution drowsiness. What changed:    how much to take  how to take this  when to take this  reasons to take this  additional instructions   aspirin 81 MG chewable tablet Chew 1 tablet (81 mg total) by mouth 2 (two) times daily.   diazepam 5 MG tablet Commonly known as:  VALIUM Take 5 mg by mouth every 6 (six) hours as needed for muscle spasms.   diphenhydrAMINE 25 MG tablet Commonly known as:  BENADRYL ALLERGY Take 1 tablet (25 mg total) by mouth every 4 (four) hours. Take with hydrocodone   docusate sodium 100 MG capsule Commonly known as:  COLACE Take 1 capsule (100 mg total) by mouth 2 (two) times daily.   DULoxetine 60 MG  capsule Commonly known as:  CYMBALTA Take 1 capsule (60 mg total) by mouth daily.   fexofenadine 180 MG tablet Commonly known as:  ALLEGRA Take 180 mg by mouth daily as needed for allergies or rhinitis.   hydrochlorothiazide 25 MG tablet Commonly known as:  HYDRODIURIL Take 1 tablet (25 mg total) by mouth daily.   HYDROcodone-acetaminophen 7.5-325 MG tablet Commonly known as:  NORCO Take 1 tablet by mouth every 4 (four) hours as needed for moderate pain.   methocarbamol 500 MG tablet Commonly known as:  ROBAXIN Take 1 tablet (500 mg total)  by mouth every 6 (six) hours as needed for muscle spasms.   pantoprazole 40 MG tablet Commonly known as:  PROTONIX Take 1 tablet (40 mg total) by mouth daily.   potassium chloride SA 20 MEQ tablet Commonly known as:  KLOR-CON M20 Take 1 tablet (20 mEq total) by mouth daily.   pravastatin 20 MG tablet Commonly known as:  PRAVACHOL Take 1 tablet (20 mg total) by mouth daily.   Chiropractor Misc Elevated toilet seat if needed   verapamil 180 MG CR tablet Commonly known as:  CALAN-SR Take 1 tablet (180 mg total) by mouth daily.            Durable Medical Equipment  (From admission, onward)        Start     Ordered   12/31/17 1141  For home use only DME 3 n 1  Once     12/31/17 1142   12/31/17 0000  DME Bedside commode    Question:  Patient needs a bedside commode to treat with the following condition  Answer:  H/O total knee replacement   12/31/17 1142   12/30/17 0906  For home use only DME Bedside commode  Once    Question:  Patient needs a bedside commode to treat with the following condition  Answer:  Total knee replacement status   12/30/17 0905       Discharge Care Instructions  (From admission, onward)        Start     Ordered   12/31/17 0000  Change dressing    Comments:  you do not need to change the dressing   12/31/17 1142       Signed: Arther Abbott 12/31/2017, 11:42 AM

## 2017-12-31 NOTE — Progress Notes (Signed)
Patient states understanding of discharge instructions.  

## 2017-12-31 NOTE — Progress Notes (Signed)
Subjective: 2 Days Post-Op Procedure(s) (LRB): TOTAL KNEE ARTHROPLASTY-right knee (Right) Patient reports pain as Significantly improved controlled well with oral medication.    Objective: Vital signs in last 24 hours: Temp:  [98.1 F (36.7 C)-99 F (37.2 C)] 98.1 F (36.7 C) (01/26 0500) Pulse Rate:  [73-90] 73 (01/26 0500) Resp:  [18-20] 18 (01/25 2134) BP: (105-121)/(56-60) 105/56 (01/26 0500) SpO2:  [93 %-95 %] 94 % (01/26 0500)  Intake/Output from previous day: 01/25 0701 - 01/26 0700 In: 120 [P.O.:120] Out: 150 [Drains:150] Intake/Output this shift: Total I/O In: 240 [P.O.:240] Out: -   Recent Labs    12/30/17 0518 12/31/17 0633  HGB 10.9* 10.6*   Recent Labs    12/30/17 0518 12/31/17 0633  WBC 8.6 14.7*  RBC 3.44* 3.31*  HCT 32.5* 31.5*  PLT 177 165   Recent Labs    12/30/17 0518  NA 136  K 3.6  CL 98*  CO2 29  BUN 12  CREATININE 0.70  GLUCOSE 108*  CALCIUM 8.2*   No results for input(s): LABPT, INR in the last 72 hours.  Neurologically intact ABD soft Neurovascular intact Sensation intact distally Intact pulses distally Dorsiflexion/Plantar flexion intact Incision: Clean dry and intact no drainage dressing changed Compartment soft  Assessment/Plan: 2 Days Post-Op Procedure(s) (LRB): TOTAL KNEE ARTHROPLASTY-right knee (Right) Discharge home with home health  Arther Abbott 12/31/2017, 11:32 AM

## 2018-01-01 DIAGNOSIS — F419 Anxiety disorder, unspecified: Secondary | ICD-10-CM | POA: Diagnosis not present

## 2018-01-01 DIAGNOSIS — Z96653 Presence of artificial knee joint, bilateral: Secondary | ICD-10-CM | POA: Diagnosis not present

## 2018-01-01 DIAGNOSIS — F329 Major depressive disorder, single episode, unspecified: Secondary | ICD-10-CM | POA: Diagnosis not present

## 2018-01-01 DIAGNOSIS — I509 Heart failure, unspecified: Secondary | ICD-10-CM | POA: Diagnosis not present

## 2018-01-01 DIAGNOSIS — G4730 Sleep apnea, unspecified: Secondary | ICD-10-CM | POA: Diagnosis not present

## 2018-01-01 DIAGNOSIS — I110 Hypertensive heart disease with heart failure: Secondary | ICD-10-CM | POA: Diagnosis not present

## 2018-01-01 DIAGNOSIS — Z471 Aftercare following joint replacement surgery: Secondary | ICD-10-CM | POA: Diagnosis not present

## 2018-01-01 DIAGNOSIS — M797 Fibromyalgia: Secondary | ICD-10-CM | POA: Diagnosis not present

## 2018-01-02 ENCOUNTER — Encounter (HOSPITAL_COMMUNITY): Payer: Self-pay | Admitting: Orthopedic Surgery

## 2018-01-02 ENCOUNTER — Telehealth: Payer: Self-pay | Admitting: Orthopedic Surgery

## 2018-01-02 LAB — TYPE AND SCREEN
ABO/RH(D): A POS
Antibody Screen: NEGATIVE
Unit division: 0
Unit division: 0

## 2018-01-02 LAB — BPAM RBC
Blood Product Expiration Date: 201902112359
Blood Product Expiration Date: 201902112359
Unit Type and Rh: 600
Unit Type and Rh: 600

## 2018-01-02 NOTE — Telephone Encounter (Signed)
Call received from [place] at [facility], [place], direct ph# [phone], requesting verbal orders for home therapy for 3x per week x 2 weeks.  Please advise.

## 2018-01-02 NOTE — Telephone Encounter (Signed)
Patient has confirmed her post op appointment, and relays that one of the medications prescribed at time of hospital discharge:  methocarbamol (ROBAXIN) 500 MG tablet 56 tablet  - states pharmacy cannot fill it, due to an insurance-related matter.

## 2018-01-02 NOTE — Telephone Encounter (Signed)
I called patient, advised Medicare plans do not like to cover Muscle relaxer in patients over 65. I have told her if she is okay without the medicine, it is okay for her not to use it, but if she feels like she needs it, to have pharmacy fill it without her insurance, should not be very expensive.  Patient states at this point she feels fine without the Methocarbamol and will let pharmacy know if anything changes  To you FYI

## 2018-01-02 NOTE — Telephone Encounter (Signed)
I called to provide verbal orders per Dr Aline Brochure.

## 2018-01-03 ENCOUNTER — Encounter (HOSPITAL_COMMUNITY): Payer: PPO

## 2018-01-04 ENCOUNTER — Other Ambulatory Visit: Payer: Self-pay | Admitting: *Deleted

## 2018-01-04 ENCOUNTER — Encounter: Payer: Self-pay | Admitting: *Deleted

## 2018-01-04 NOTE — Patient Outreach (Signed)
[facility] [facility]) Care Management  01/04/2018  Emily [name] 22-Mar-1951 [id number]  Referral via " ADT" of hospital admission/discharge:  Per hx: Admission 1/24-1/26/2019 Dx: primary osteoarthritis -right knee [place]-Total Knee Arthroplasty 12/29/2017  Telephone call to patient who was advised of reason for call & of [facility] care management services.   Sherrin Daisy, RN BSN [place] Management Coordinator [facility] Care Management  [phone]

## 2018-01-05 ENCOUNTER — Encounter (HOSPITAL_COMMUNITY): Payer: PPO

## 2018-01-05 NOTE — Patient Outreach (Signed)
[facility]) Care Management  01/05/2018  Emily [name] [birth year] [id number]  Referral via electronic "ADT " of admission & discharge of patient 1/24-1/26/2019;  Chart Hx: Dx: Primary osteoarthritis Surgery: Total Knee Arthroplasty-right knee Discharged home with CPM machine & home health services.  Telephone call to patient who was advised of reason for call & [facility] care management services. HIPPA verification received from patient.  Patient confirms that she had right total knee replacement 1/24 and is now home with support from her spouse as needed.  States home health services have started and that she uses continuous motion machine daily as directed by her MD. 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 States she manages her medications and is taking as prescribed. States her pain is under control with her medication. States her hospital follow up appointment is scheduled for 2/6 & spouse will be taking her.   Voices that she does not need [facility] services at this time-telephonic or community. Thanked this Electronics engineer for calling. Sates she has contact information if needed.   Plan: Send to care management assistant for case closure.   Sherrin Daisy, RN BSN [place] Management Coordinator [facility] Care Management  [phone]

## 2018-01-09 DIAGNOSIS — Z471 Aftercare following joint replacement surgery: Secondary | ICD-10-CM | POA: Diagnosis not present

## 2018-01-09 DIAGNOSIS — F419 Anxiety disorder, unspecified: Secondary | ICD-10-CM | POA: Diagnosis not present

## 2018-01-09 DIAGNOSIS — Z96653 Presence of artificial knee joint, bilateral: Secondary | ICD-10-CM | POA: Diagnosis not present

## 2018-01-09 DIAGNOSIS — M797 Fibromyalgia: Secondary | ICD-10-CM | POA: Diagnosis not present

## 2018-01-09 DIAGNOSIS — I509 Heart failure, unspecified: Secondary | ICD-10-CM | POA: Diagnosis not present

## 2018-01-09 DIAGNOSIS — I110 Hypertensive heart disease with heart failure: Secondary | ICD-10-CM | POA: Diagnosis not present

## 2018-01-09 DIAGNOSIS — G4730 Sleep apnea, unspecified: Secondary | ICD-10-CM | POA: Diagnosis not present

## 2018-01-09 DIAGNOSIS — F329 Major depressive disorder, single episode, unspecified: Secondary | ICD-10-CM | POA: Diagnosis not present

## 2018-01-10 ENCOUNTER — Encounter (HOSPITAL_COMMUNITY): Payer: PPO

## 2018-01-10 DIAGNOSIS — Z96651 Presence of right artificial knee joint: Secondary | ICD-10-CM | POA: Insufficient documentation

## 2018-01-11 ENCOUNTER — Ambulatory Visit (INDEPENDENT_AMBULATORY_CARE_PROVIDER_SITE_OTHER): Payer: Self-pay | Admitting: Orthopedic Surgery

## 2018-01-11 ENCOUNTER — Encounter: Payer: Self-pay | Admitting: Orthopedic Surgery

## 2018-01-11 DIAGNOSIS — Z96651 Presence of right artificial knee joint: Secondary | ICD-10-CM

## 2018-01-11 MED ORDER — HYDROCODONE-ACETAMINOPHEN 7.5-325 MG PO TABS: 1.0000 | ORAL_TABLET | Freq: Four times a day (QID) | ORAL | 0 refills | Status: DC | PRN

## 2018-01-11 NOTE — Progress Notes (Signed)
POST OP VISIT   Patient ID: Emily [name], female   DOB: Apr 10, 1951, 67 y.o.   MRN: [id number]  Chief Complaint  Patient presents with  . Post-op Follow-up    right total knee replacement 12/29/17 13 days     Encounter Diagnosis  Name Primary?  . S/P TKR (total knee replacement), right 12/29/17    Staples were removed today the knee looks clean dry and intact therapy notes indicate her range of motion is 4-102 degrees and she demonstrates that in the office today  She has no sign of DVT she is ambulating well with assistive device follow-up in 4 weeks   start physical therapy as an outpatient as soon as we can get an appointment  Meds ordered this encounter  Medications  . HYDROcodone-acetaminophen (NORCO) 7.5-325 MG tablet    Sig: Take 1 tablet by mouth every 6 (six) hours as needed for moderate pain.    Dispense:  28 tablet    Refill:  0

## 2018-01-12 ENCOUNTER — Telehealth: Payer: Self-pay | Admitting: Family Medicine

## 2018-01-12 ENCOUNTER — Encounter (HOSPITAL_COMMUNITY): Payer: PPO

## 2018-01-12 NOTE — Telephone Encounter (Signed)
The interpretation of the patient's home sleep study came back.  It showed moderate to severe sleep apnea.  The specialist recommended a auto CPAP machine for auto titration 8-14 cm of pressure.  Please find out from the patient does she currently have that machine or do we need to order this? (Nurses-it is possible that this patient already has a CPAP machine and equipment it is also possible that she needs a new one-I cannot completely tell based on previous notes)

## 2018-01-12 NOTE — Telephone Encounter (Signed)
She may have a prescription for a new machine and supplies this can be through whatever place accepts her insurance if necessary have [place] assist.  Hopefully with a prescription the patient can take ownership of this and get her own machine and supplies auto titrate CPAP machine with necessary supplies settings 8-14 cm of pressure

## 2018-01-12 NOTE — Telephone Encounter (Signed)
Patient states she has a machine from [place] at a [facility] but they no longer accept her insurance and will need a new machine and equipment for a different

## 2018-01-17 ENCOUNTER — Encounter (HOSPITAL_COMMUNITY): Payer: PPO

## 2018-01-17 ENCOUNTER — Other Ambulatory Visit: Payer: Self-pay

## 2018-01-17 ENCOUNTER — Encounter (HOSPITAL_COMMUNITY): Payer: Self-pay | Admitting: Physical Therapy

## 2018-01-17 ENCOUNTER — Ambulatory Visit (HOSPITAL_COMMUNITY): Payer: PPO | Attending: Family Medicine | Admitting: Physical Therapy

## 2018-01-17 DIAGNOSIS — M25661 Stiffness of right knee, not elsewhere classified: Secondary | ICD-10-CM | POA: Insufficient documentation

## 2018-01-17 DIAGNOSIS — M25561 Pain in right knee: Secondary | ICD-10-CM | POA: Insufficient documentation

## 2018-01-17 DIAGNOSIS — R262 Difficulty in walking, not elsewhere classified: Secondary | ICD-10-CM | POA: Diagnosis not present

## 2018-01-17 NOTE — Therapy (Signed)
[place], [place], [address] Phone: [phone]   Fax:  [phone]  Physical Therapy Evaluation  Patient Details  Name: Emily [name] MRN: [id number] Date of Birth: [birth year] Referring Provider: Dr. Arther Abbott   Encounter Date: 01/17/2018  PT End of Session - 01/17/18 1030    Visit Number  1    Number of Visits  12    Date for PT Re-Evaluation  02/16/18    Authorization - Visit Number  1    Authorization - Number of Visits  12    PT Start Time  1696    PT Stop Time  1028    PT Time Calculation (min)  41 min    Behavior During Therapy  [facility] for tasks assessed/performed       Past Medical History:  Diagnosis Date  . Acid reflux   . Anxiety   . Arthritis   . CHF (congestive heart failure) (HCC)    90's swelling body-no breathing problems at that time-no cardiac -in dr s Wolfgang [place] office [place] give lasix and potassium  . Complication of anesthesia    History of being told she was allergic to something in 1980 no problems since  . Depression   . Fibromyalgia   . History of blood transfusion reaction 1978  . History of hiatal hernia   . HNP (herniated nucleus pulposus)   . Hypertension    Does not see a cardiologist no stress or echo  . Joint pain   . Nerve pain    right leg   . Ruptured lumbar disc   . Sleep apnea    Uses CPAP every night setting at 7 mm/Hg    Past Surgical History:  Procedure Laterality Date  . BACK SURGERY     x3  . BLADDER REPAIR     tear during during birth of child  . CARPAL TUNNEL RELEASE Bilateral   . CESAREAN SECTION    . CHOLECYSTECTOMY     gallbladder disease; stayed sick  . COLONOSCOPY N/A 04/28/2016   Procedure: COLONOSCOPY;  Surgeon: [name], MD;  [facility];  Service: Endoscopy;  Laterality: N/A;  . ESOPHAGOGASTRODUODENOSCOPY N/A 04/28/2016   Procedure: ESOPHAGOGASTRODUODENOSCOPY (EGD);  Surgeon: [name], MD;  Location: [facility]  ENDO SUITE;  Service: Endoscopy;  Laterality: N/A;  12:00  . GALLBLADDER SURGERY    . knee right (torn Cart)    . REPLACEMENT TOTAL KNEE     left  . ROTATOR CUFF REPAIR Bilateral    right and left  . SPINAL FUSION    . TOTAL KNEE ARTHROPLASTY Right 12/29/2017   Procedure: TOTAL KNEE ARTHROPLASTY-right knee;  Surgeon: [name], MD;  Location: [facility] ORS;  Service: Orthopedics;  Laterality: Right;  . TUBAL LIGATION    . vein right leg removed    . wrist ganglion cyst left      There were no vitals filed for this visit.   Subjective Assessment - 01/17/18 0935    Subjective  Emily [name] states that she had a TKR on 12/29/2017.  She was discharged to home and home health services ended on 01/13/2018 and is now being referred to out patient therapy     Limitations  Sitting;Standing;Lifting;Walking;House hold activities    How long can you sit comfortably?  10-15 minutes    How long can you stand comfortably?  20 minutes     How long can you walk comfortably?  Walks with a cane for 5-10 minutes     Patient Stated Goals  To have less pain and have better strength to be able to walk without her cane .    Currently in Pain?  Yes    Pain Score  4  worst pain in the past week 8/10     Pain Location  Knee    Pain Orientation  Right    Pain Descriptors / Indicators  Tightness;Aching;Burning;Sore    Pain Type  Acute pain    Pain Onset  1 to 4 weeks ago    Pain Frequency  Constant    Aggravating Factors   bending and wb    Pain Relieving Factors  ice and medication     Effect of Pain on Daily Activities  decreases ADL    Multiple Pain Sites  No         [facility] PT Assessment - 01/17/18 0001      Assessment   Medical Diagnosis  Rt TKR    Referring Provider  Dr. Arther Abbott    Onset Date/Surgical Date  12/29/17    Next MD Visit  02/10/2018    Prior Therapy  HH      Precautions   Precautions  None      Restrictions   Weight Bearing Restrictions  No      Balance Screen   Has  the patient fallen in the past 6 months  No    Has the patient had a decrease in activity level because of a fear of falling?   No    Is the patient reluctant to leave their home because of a fear of falling?   No      Home Film/video editor residence      Prior Function   Level of Independence  Independent    Vocation  Retired    Loss adjuster, chartered,     Leisure  sewing needs to go up 15 steps       Cognition   Overall Cognitive Status  Within Functional Limits for tasks assessed      Observation/Other Assessments   Focus on Therapeutic Outcomes (FOTO)   37      Functional Tests   Functional tests  Single leg stance;Sit to Stand      Single Leg Stance   Comments  Lt:  60;  RT 33      Sit to Stand   Comments  5 x 24.45       AROM   Right Knee Extension  8    Right Knee Flexion  106 prior to TKR was 118      Strength   Right/Left Hip  Right;Left    Right Hip Flexion  5/5    Right Hip Extension  4-/5    Right Hip ABduction  5/5    Left Hip Extension  4+/5    Left Hip ABduction  5/5    Right Knee Flexion  5/5    Right Knee Extension  4+/5    Left Knee Flexion  5/5    Left Knee Extension  5/5    Right Ankle Dorsiflexion  5/5    Left Ankle Dorsiflexion  5/5      Ambulation/Gait   Ambulation Distance (Feet)  472 Feet    Assistive device  None    Gait Comments  3' :  Need to remind pt to hit with heel and bend knee  Objective measurements completed on examination: See above findings.      [facility] Adult PT Treatment/Exercise - 01/17/18 0001      Exercises   Exercises  Knee/Hip      Knee/Hip Exercises: Stretches   Active Hamstring Stretch  Right;3 reps;30 seconds      Knee/Hip Exercises: Standing   Heel Raises  10 reps    SLS  Rt x 3      Knee/Hip Exercises: Supine   Quad Sets  10 reps    Heel Slides  10 reps             PT Education - 01/17/18 1029    Education provided  Yes    Education  Details  HEP    Person(s) Educated  Patient    Methods  Explanation    Comprehension  Verbalized understanding       PT Short Term Goals - 01/17/18 1328      PT SHORT TERM GOAL #1   Title  Pt right knee flexion to be to 110 to allow pt to be comfortable sitting for 45 minutes in order to enjoy a meal.     Time  2    Period  Weeks    Status  New    Target Date  01/31/18      PT SHORT TERM GOAL #2   Title  PT to be walking inside the house without any assistive device.     Time  2    Period  Weeks    Status  New      PT SHORT TERM GOAL #3   Title  PT to be ablle to walk for 45 minutes with a cane to allow pt to shop without difficulty     Time  2    Period  Weeks    Status  New      PT SHORT TERM GOAL #4   Title  PT Rt knee pain to be no greater than a 5/10 to allow pt to be waking only one time a night.     Time  2    Period  Weeks    Status  New        PT Long Term Goals - 01/17/18 1331      PT LONG TERM GOAL #1   Title  Pt Rt knee extension to be 4 or less to allow pt to have a normalized gait pattern     Time  4    Period  Weeks    Status  New    Target Date  02/14/18      PT LONG TERM GOAL #2   Title  PT to be walking outside without a cane .    Time  4    Status  New      PT LONG TERM GOAL #3   Title  PT to be able to go up and down 14 steps in a reciprocal manner.     Time  4    Period  Weeks    Status  New      PT LONG TERM GOAL #4   Title  PT right knee pain to be no greater than a 2/10 to allow pt to sleep throughout the night.     Time  4    Period  Weeks    Status  New      PT LONG TERM GOAL #5   Title  PT Rt knee flexion to  be to 115 to allow pt to ride in a car for up to 2 hours for traveling.     Time  4    Period  Weeks    Status  New             Plan - 01/17/18 1030    Clinical Impression Statement  Pt is a 67 yo female who has had a recent TKR on 12/29/2017.  She had home health therapy and is now being referred to physical  therapy to maximize her functional potential.  Evaluation shows pt still ambulating with a cane, with significant pain interferring with her sleep, decreased ROM ,decreased balance  difficulty completing steps and slight decreased strength.  She will benefit from skillled outpatient therapy to address these deficits and maximize her functional ability.     Clinical Presentation  Stable    Rehab Potential  Good    Clinical Impairments Affecting Rehab Potential  Pain, ROM, strength, balance, edema    PT Frequency  3x / week    PT Duration  4 weeks    PT Treatment/Interventions  ADLs/Self Care Home Management;Cryotherapy;Gait training;Stair training;Functional mobility training;Therapeutic activities;Therapeutic exercise;Balance training;Neuromuscular re-education;Patient/family education;Manual techniques;Passive range of motion    PT Next Visit Plan  Continue gait training with no assistive device, begin sit to stand, step ups and downs, balance, progress to steps, continue to progress ROM  and complete manual to decrease edema.     PT Home Exercise Plan  Hamstring stretch, quad set, heel slide, bridging , heel raises, and functional squats.     Consulted and Agree with Plan of Care  Patient       Patient will benefit from skilled therapeutic intervention in order to improve the following deficits and impairments:  Abnormal gait, Decreased endurance, Hypomobility, Increased edema, Decreased activity tolerance, Decreased strength, Increased fascial restricitons, Pain, Decreased balance, Difficulty walking, Decreased range of motion, Impaired flexibility  Visit Diagnosis: Stiffness of right knee - Plan: PT plan of care cert/re-cert  Acute pain of right knee - Plan: PT plan of care cert/re-cert  Difficulty in walking, not elsewhere classified - Plan: PT plan of care cert/re-cert     Problem List Patient Active Problem List   Diagnosis Date Noted  . S/P TKR (total knee replacement), right  12/29/17 01/10/2018  . Primary osteoarthritis of right knee   . GERD without esophagitis 03/28/2017  . Obesity (BMI 30-39.9) 03/28/2017  . Obesity 10/06/2016  . Lumbar foraminal stenosis 06/04/2016  . Carpal tunnel syndrome, bilateral 02/07/2014  . S/P lumbar spinal fusion 02/07/2014  . Obstructive sleep apnea 02/04/2014  . Hand arthritis 10/15/2013  . HTN (hypertension), benign 08/24/2013  . Hypertriglyceridemia 08/24/2013  . Iliotibial band tendonitis 02/08/2013  . Trigger finger, acquired 02/08/2013  . Low back pain 06/22/2011  . HNP (herniated nucleus pulposus) 06/22/2011  . HIP PAIN, RIGHT 12/15/2010  . LUMBOSACRAL SPONDYLOSIS WITHOUT MYELOPATHY 12/15/2010  . SPINAL STENOSIS, LUMBAR 12/15/2010  . KNEE REPLACEMENT, HX OF 02/26/2010  . DERANGEMENT MENISCUS 07/03/2009  . KNEE, ARTHRITIS, DEGEN./OSTEO 05/22/2009  . KNEE PAIN 05/22/2009  . ANSERINE BURSITIS 05/22/2009  . TRIGGER FINGER 09/23/2008    Rayetta Humphrey, PT CLT [id number] 01/17/2018, 1:41 PM  [place] [address] [place], [place], [address] Phone: [phone]   Fax:  [phone]  Name: Emily [name] MRN: [id number] Date of Birth: 08/22/1951

## 2018-01-17 NOTE — Patient Instructions (Addendum)
Stretching: Hamstring (Supine)    Supporting right thigh behind knee, slowly straighten knee until stretch is felt in back of thigh. Hold _30___ seconds. Repeat __3__ times per set. Do ___1_ sets per session. Do __2__ sessions per day.  http://orth.exer.us/656   Copyright  VHI. All rights reserved.  Self-Mobilization: Heel Slide (Supine)    Slide left heel toward buttocks until a gentle stretch is felt. Hold __5__ seconds. Relax. Repeat _10___ times per set. Do __1__ sets per session. Do 2____ sessions per day.  http://orth.exer.us/710   Copyright  VHI. All rights reserved.  Strengthening: Quadriceps Set    Tighten muscles on top of thighs by pushing knees down into surface. Hold _5__ seconds. Repeat _10___ times per set. Do _1___ sets per session. Do ___2_ sessions per day.  http://orth.exer.us/602   Copyright  VHI. All rights reserved.  Heel Raise: Bilateral (Standing)    Rise on balls of feet. Repeat __10__ times per set. Do __1__ sets per session. Do __2__ sessions per day.  http://orth.exer.us/38   Copyright  VHI. All rights reserved.  Balance: Unilateral   At the kitchen counter  Attempt to balance on left leg, eyes open. Hold __30-60__ seconds. Repeat _3___ times per set. Do __1__ sets per session. Do ___2_ sessions per day. Perform exercise with eyes closed.  http://orth.exer.us/28   Copyright  VHI. All rights reserved.  Functional Quadriceps: Chair Squat    Keeping feet flat on floor, shoulder width apart, squat as low as is comfortable. Use support as necessary. Repeat 10____ times per set. Do 1____ sets per session. Do ___2_ sessions per day.  http://orth.exer.us/736   Copyright  VHI. All rights reserved.  Bridging    Slowly raise buttocks from floor, keeping stomach tight. Repeat _10___ times per set. Do _1___ sets per session. Do _2___ sessions per day.  http://orth.exer.us/1096   Copyright  VHI. All rights reserved.

## 2018-01-19 ENCOUNTER — Encounter (HOSPITAL_COMMUNITY): Payer: PPO

## 2018-01-19 ENCOUNTER — Encounter (HOSPITAL_COMMUNITY): Payer: Self-pay

## 2018-01-19 ENCOUNTER — Ambulatory Visit (HOSPITAL_COMMUNITY): Payer: PPO

## 2018-01-19 DIAGNOSIS — M25661 Stiffness of right knee, not elsewhere classified: Secondary | ICD-10-CM

## 2018-01-19 DIAGNOSIS — M25561 Pain in right knee: Secondary | ICD-10-CM

## 2018-01-19 DIAGNOSIS — R262 Difficulty in walking, not elsewhere classified: Secondary | ICD-10-CM

## 2018-01-19 NOTE — Therapy (Signed)
[place], [place], [address] Phone: [phone]   Fax:  [phone]  Physical Therapy Treatment  Patient Details  Name: Emily [name] MRN: [id number] Date of Birth: 10/21/1951 Referring Provider: Dr. Arther Abbott   Encounter Date: 01/19/2018  PT End of Session - 01/19/18 1104    Visit Number  2    Number of Visits  12    Date for PT Re-Evaluation  02/16/18    Authorization - Visit Number  2    Authorization - Number of Visits  12    PT Start Time  0903    PT Stop Time  0946    PT Time Calculation (min)  43 min    Behavior During Therapy  [facility] for tasks assessed/performed       Past Medical History:  Diagnosis Date  . Acid reflux   . Anxiety   . Arthritis   . CHF (congestive heart failure) (HCC)    90's swelling body-no breathing problems at that time-no cardiac -in dr s [place] office [place] give lasix and potassium  . Complication of anesthesia    History of being told she was allergic to something in 1980 no problems since  . Depression   . Fibromyalgia   . History of blood transfusion reaction 1978  . History of hiatal hernia   . HNP (herniated nucleus pulposus)   . Hypertension    Does not see a cardiologist no stress or echo  . Joint pain   . Nerve pain    right leg   . Ruptured lumbar disc   . Sleep apnea    Uses CPAP every night setting at 7 mm/Hg    Past Surgical History:  Procedure Laterality Date  . BACK SURGERY     x3  . BLADDER REPAIR     tear during during birth of child  . CARPAL TUNNEL RELEASE Bilateral   . CESAREAN SECTION    . CHOLECYSTECTOMY     gallbladder disease; stayed sick  . COLONOSCOPY N/A 04/28/2016   Procedure: COLONOSCOPY;  Surgeon: [name], MD;  [facility];  Service: Endoscopy;  Laterality: N/A;  . ESOPHAGOGASTRODUODENOSCOPY N/A 04/28/2016   Procedure: ESOPHAGOGASTRODUODENOSCOPY (EGD);  Surgeon: [name], MD;  Location: [facility]  END[facility];  Service: Endoscopy;  Laterality: N/A;  12:00  . GALLBLADDER SURGERY    . knee right (torn Cart)    . REPLACEMENT TOTAL KNEE     left  . ROTATOR CUFF REPAIR Bilateral    right and left  . SPINAL FUSION    . TOTAL KNEE ARTHROPLASTY Right 12/29/2017   Procedure: TOTAL KNEE ARTHROPLASTY-right knee;  Surgeon: [name], MD;  Location: [facility]S;  Service: Orthopedics;  Laterality: Right;  . TUBAL LIGATION    . vein right leg removed    . wrist ganglion cyst left      There were no vitals filed for this visit.  Subjective Assessment - 01/19/18 1059    Subjective  States she did not sleep well last night due to pain. Took pain meds this am; noticed knee was quite swollen, 6/10 pain post meds 1 hour ago; pain across posterior knee up right side and the whole front.    Patient is accompained by:  Family member    Pertinent History  Osteoarthritis moderate in Right knee; TKA in Left knee; R TKA 12/29/2017    Limitations  Sitting;Standing;Lifting;Walking;House hold activities  How long can you sit comfortably?  10-15 minutes    How long can you stand comfortably?  20 minutes     How long can you walk comfortably?  Walks with a cane for 5-10 minutes     Diagnostic tests  x-rays: demonstrate right knee osteoarthritis    Patient Stated Goals  To have less pain and have better strength to be able to walk without her cane .    Currently in Pain?  Yes    Pain Score  6     Pain Location  Knee    Pain Orientation  Right;Posterior;Anterior;Lateral;Mid    Pain Descriptors / Indicators  Tightness;Aching;Burning;Sore    Pain Type  Acute pain    Pain Onset  1 to 4 weeks ago    Pain Frequency  Constant         [facility] PT Assessment - 01/19/18 0001      Assessment   Medical Diagnosis  Rt TKR    Onset Date/Surgical Date  12/29/17    Next MD Visit  02/10/2018    Prior Therapy  HH      Precautions   Precautions  None      Restrictions   Weight Bearing Restrictions  No       Home Film/video editor residence      Prior Function   Level of Independence  Independent    Vocation  Retired    Loss adjuster, chartered,     Leisure  sewing needs to go up 15 steps       Cognition   Overall Cognitive Status  Within Functional Limits for tasks assessed      Observation/Other Assessments   Focus on Therapeutic Outcomes (FOTO)   --      Functional Tests   Functional tests  Single leg stance;Sit to Stand      Single Leg Stance   Comments  Lt:  60;  RT 33      Sit to Stand   Comments  5 x 24.45       AROM   Right Knee Extension  8    Right Knee Flexion  106 prior to TKR was 118      Strength   Right Hip Flexion  5/5    Right Hip Extension  4-/5    Right Hip ABduction  5/5    Left Hip Extension  4+/5    Left Hip ABduction  5/5    Right Knee Flexion  5/5    Right Knee Extension  4+/5    Left Knee Flexion  5/5    Left Knee Extension  5/5    Right Ankle Dorsiflexion  5/5    Left Ankle Dorsiflexion  5/5      Ambulation/Gait   Assistive device  None                  [facility] Adult PT Treatment/Exercise - 01/19/18 0001      Ambulation/Gait   Ambulation Distance (Feet)  525 Feet    Gait Pattern  Decreased step length - left;Right flexed knee in stance;Antalgic    Ambulation Surface  Level    Gait Comments  work on heel/toe gait pattern with equal length steps      Knee/Hip Exercises: Stretches   Active Hamstring Stretch  Right;3 reps;30 seconds    Gastroc Stretch  Right;3 reps;30 seconds slantboard      Knee/Hip Exercises: Standing   Heel  Raises  10 reps    Forward Lunges  Right;5 reps;5 seconds knee drive on 4" step    Lateral Step Up  1 set;10 reps;Step Height: 2"    Forward Step Up  1 set;10 reps;Step Height: 2"    Other Standing Knee Exercises  STS from chair  x10             PT Education - 01/19/18 1104    Education provided  Yes    Education Details  HEP    Person(s) Educated  Patient     Methods  Explanation    Comprehension  Verbalized understanding;Returned demonstration       PT Short Term Goals - 01/17/18 1328      PT SHORT TERM GOAL #1   Title  Pt right knee flexion to be to 110 to allow pt to be comfortable sitting for 45 minutes in order to enjoy a meal.     Time  2    Period  Weeks    Status  New    Target Date  01/31/18      PT SHORT TERM GOAL #2   Title  PT to be walking inside the house without any assistive device.     Time  2    Period  Weeks    Status  New      PT SHORT TERM GOAL #3   Title  PT to be ablle to walk for 45 minutes with a cane to allow pt to shop without difficulty     Time  2    Period  Weeks    Status  New      PT SHORT TERM GOAL #4   Title  PT Rt knee pain to be no greater than a 5/10 to allow pt to be waking only one time a night.     Time  2    Period  Weeks    Status  New        PT Long Term Goals - 01/17/18 1331      PT LONG TERM GOAL #1   Title  Pt Rt knee extension to be 4 or less to allow pt to have a normalized gait pattern     Time  4    Period  Weeks    Status  New    Target Date  02/14/18      PT LONG TERM GOAL #2   Title  PT to be walking outside without a cane .    Time  4    Status  New      PT LONG TERM GOAL #3   Title  PT to be able to go up and down 14 steps in a reciprocal manner.     Time  4    Period  Weeks    Status  New      PT LONG TERM GOAL #4   Title  PT right knee pain to be no greater than a 2/10 to allow pt to sleep throughout the night.     Time  4    Period  Weeks    Status  New      PT LONG TERM GOAL #5   Title  PT Rt knee flexion to be to 115 to allow pt to ride in a car for up to 2 hours for traveling.     Time  4    Period  Weeks    Status  New  Plan - 01/19/18 1105    Clinical Impression Statement  Patient tolerated treatment well with complaints of mild pain and stiffness in the right knee. Patient requires cues to normalized gait pattern, exhibited  decreased ROM and muscle length, decreased functional strength on step ups and decreased functional ability on sit-to-stands at lower leveled chairs. Patient would continue to beneift from skilled physical therapy to improved on these deficits.     Rehab Potential  Good    Clinical Impairments Affecting Rehab Potential  Pain, ROM, strength, balance, edema    PT Frequency  3x / week    PT Duration  4 weeks    PT Treatment/Interventions  ADLs/Self Care Home Management;Cryotherapy;Gait training;Stair training;Functional mobility training;Therapeutic activities;Therapeutic exercise;Balance training;Neuromuscular re-education;Patient/family education;Manual techniques;Passive range of motion    PT Next Visit Plan  Continue gait training with no assistive device, begin sit to stand at lower mat level than regular chair, progress step ups and downs to 4", balance, progress to steps, continue to progress ROM  and complete manual to decrease edema.     PT Home Exercise Plan  eval - Hamstring stretch, quad set, heel slide, bridging , heel raises, and functional squats. 01/19/2018 - step up fwd/lat, wall calf stretch, hamstring stretch standing, sit-to-stand    Consulted and Agree with Plan of Care  Patient       Patient will benefit from skilled therapeutic intervention in order to improve the following deficits and impairments:  Abnormal gait, Decreased endurance, Hypomobility, Increased edema, Decreased activity tolerance, Decreased strength, Increased fascial restricitons, Pain, Decreased balance, Difficulty walking, Decreased range of motion, Impaired flexibility  Visit Diagnosis: Stiffness of right knee  Acute pain of right knee  Difficulty in walking, not elsewhere classified     Problem List Patient Active Problem List   Diagnosis Date Noted  . S/P TKR (total knee replacement), right 12/29/17 01/10/2018  . Primary osteoarthritis of right knee   . GERD without esophagitis 03/28/2017  .  Obesity (BMI 30-39.9) 03/28/2017  . Obesity 10/06/2016  . Lumbar foraminal stenosis 06/04/2016  . Carpal tunnel syndrome, bilateral 02/07/2014  . S/P lumbar spinal fusion 02/07/2014  . Obstructive sleep apnea 02/04/2014  . Hand arthritis 10/15/2013  . HTN (hypertension), benign 08/24/2013  . Hypertriglyceridemia 08/24/2013  . Iliotibial band tendonitis 02/08/2013  . Trigger finger, acquired 02/08/2013  . Low back pain 06/22/2011  . HNP (herniated nucleus pulposus) 06/22/2011  . HIP PAIN, RIGHT 12/15/2010  . LUMBOSACRAL SPONDYLOSIS WITHOUT MYELOPATHY 12/15/2010  . SPINAL STENOSIS, LUMBAR 12/15/2010  . KNEE REPLACEMENT, HX OF 02/26/2010  . DERANGEMENT MENISCUS 07/03/2009  . KNEE, ARTHRITIS, DEGEN./OSTEO 05/22/2009  . KNEE PAIN 05/22/2009  . ANSERINE BURSITIS 05/22/2009  . TRIGGER FINGER 09/23/2008    Floria Raveling. Hartnett-Rands, MS, PT [facility] #[id number] 01/19/2018, 11:12 AM  [place], [place], [address] Phone: [phone]   Fax:  [phone]  Name: Emily [name] MRN: [id number] Date of Birth: May 05, 1951

## 2018-01-19 NOTE — Patient Instructions (Addendum)
Step-Down / Step-Up    Stand on stair step or 2____ inch stool. Slowly bend left leg, lowering other foot to floor. Return by straightening front leg. Repeat 10____ times per set. Do _1___ sets per session. Do __2__ sessions per day.  http://orth.exer.us/684   Copyright  VHI. All rights reserved.  Calf Stretch    Hands on wall, shoulder height, slightly wider than shoulders, fingers up. Left foot ahead of right. Body straight (like a board); lean into wall by bending front knee. Keep right heel on floor and foot straight ahead. Hold _30__ seconds. Push away with arms. Repeat _2__ times. Do on other leg.  Copyright  VHI. All rights reserved.  Hamstring Stretch (Standing)    Standing, place one heel on chair or bench. Use one or both hands on thigh for support. Keeping torso straight, lean forward slowly until a stretch is felt in back of same thigh. Hold _30___ seconds. Repeat 2 times.  Copyright  VHI. All rights reserved.  FUNCTIONAL MOBILITY: Squat    Stance: shoulder-width on floor. Bend hips and knees. Keep back straight. Do not allow knees to bend past toes. Squeeze glutes and quads to stand. _10__ reps per set, 2___ sets per day, _5__ days per week  Copyright  VHI. All rights reserved.

## 2018-01-23 ENCOUNTER — Telehealth: Payer: Self-pay | Admitting: Family Medicine

## 2018-01-23 NOTE — Telephone Encounter (Signed)
Please sign order for [employer] & forward to [facility] to fax with all required documentation ° °In red folder in yellow box  °

## 2018-01-23 NOTE — Telephone Encounter (Signed)
New order written, sent to Dr. Nicki Reaper to sign along with new phone message for documentation

## 2018-01-23 NOTE — Telephone Encounter (Signed)
Faxed order to [facility] with demographics & insurance information  Sent 2008 sleep study, 2008 & 2009 OV notes from paper chart as well as recent OV notes & sleep study results from Epic chart  Filed

## 2018-01-23 NOTE — Telephone Encounter (Signed)
This form was signed thank you 

## 2018-01-24 ENCOUNTER — Ambulatory Visit (HOSPITAL_COMMUNITY): Payer: PPO

## 2018-01-24 ENCOUNTER — Telehealth (HOSPITAL_COMMUNITY): Payer: Self-pay | Admitting: Family Medicine

## 2018-01-24 NOTE — Telephone Encounter (Signed)
01/24/18  pt left a message that she was cx because she has a stomach bug

## 2018-01-25 ENCOUNTER — Ambulatory Visit (HOSPITAL_COMMUNITY): Payer: PPO | Admitting: Physical Therapy

## 2018-01-25 DIAGNOSIS — R262 Difficulty in walking, not elsewhere classified: Secondary | ICD-10-CM

## 2018-01-25 DIAGNOSIS — M25661 Stiffness of right knee, not elsewhere classified: Secondary | ICD-10-CM

## 2018-01-25 DIAGNOSIS — M25561 Pain in right knee: Secondary | ICD-10-CM

## 2018-01-25 NOTE — Therapy (Signed)
[place], [place], [address] Phone: [phone]   Fax:  [phone]  Physical Therapy Treatment  Patient Details  Name: Emily [name] MRN: [id number] Date of Birth: 1950/12/29 Referring Provider: Dr. Arther Abbott   Encounter Date: 01/25/2018  PT End of Session - 01/25/18 1105    Visit Number  3    Number of Visits  12    Date for PT Re-Evaluation  02/16/18    Authorization - Visit Number  3    Authorization - Number of Visits  12    PT Start Time  5809    PT Stop Time  1115    PT Time Calculation (min)  40 min    Behavior During Therapy  [facility] for tasks assessed/performed       Past Medical History:  Diagnosis Date  . Acid reflux   . Anxiety   . Arthritis   . CHF (congestive heart failure) (HCC)    90's swelling body-no breathing problems at that time-no cardiac -in dr s Wolfgang [place] office [place] give lasix and potassium  . Complication of anesthesia    History of being told she was allergic to something in 1980 no problems since  . Depression   . Fibromyalgia   . History of blood transfusion reaction 1978  . History of hiatal hernia   . HNP (herniated nucleus pulposus)   . Hypertension    Does not see a cardiologist no stress or echo  . Joint pain   . Nerve pain    right leg   . Ruptured lumbar disc   . Sleep apnea    Uses CPAP every night setting at 7 mm/Hg    Past Surgical History:  Procedure Laterality Date  . BACK SURGERY     x3  . BLADDER REPAIR     tear during during birth of child  . CARPAL TUNNEL RELEASE Bilateral   . CESAREAN SECTION    . CHOLECYSTECTOMY     gallbladder disease; stayed sick  . COLONOSCOPY N/A 04/28/2016   Procedure: COLONOSCOPY;  Surgeon: [name], MD;  [facility];  Service: Endoscopy;  Laterality: N/A;  . ESOPHAGOGASTRODUODENOSCOPY N/A 04/28/2016   Procedure: ESOPHAGOGASTRODUODENOSCOPY (EGD);  Surgeon: [name], MD;  Location: [facility]  ENDO SUITE;  Service: Endoscopy;  Laterality: N/A;  12:00  . GALLBLADDER SURGERY    . knee right (torn Cart)    . REPLACEMENT TOTAL KNEE     left  . ROTATOR CUFF REPAIR Bilateral    right and left  . SPINAL FUSION    . TOTAL KNEE ARTHROPLASTY Right 12/29/2017   Procedure: TOTAL KNEE ARTHROPLASTY-right knee;  Surgeon: [name], MD;  Location: [facility] ORS;  Service: Orthopedics;  Laterality: Right;  . TUBAL LIGATION    . vein right leg removed    . wrist ganglion cyst left      There were no vitals filed for this visit.  Subjective Assessment - 01/25/18 1105    Subjective  PT states she had a stomach virus yesterday but has been 24 hours since last episode.  Currently with 2/10 pain .  Reports compliance with HEP.    Currently in Pain?  Yes    Pain Score  2     Pain Location  Knee    Pain Orientation  Right  [facility] PT Treatment/Exercise - 01/25/18 0001      Knee/Hip Exercises: Stretches   Active Hamstring Stretch  Right;3 reps;30 seconds    Knee: Self-Stretch to increase Flexion  Right;10 seconds;Limitations    Knee: Self-Stretch Limitations  10 reps onto 12" box    Gastroc Stretch  Right;3 reps;30 seconds      Knee/Hip Exercises: Standing   Heel Raises  15 reps    Knee Flexion  Right;10 reps    Forward Lunges  Right;10 reps;Limitations    Forward Lunges Limitations  no UE's onto 4" box    Lateral Step Up  Right;10 reps;Step Height: 4";Hand Hold: 1    Forward Step Up  Right;10 reps;Hand Hold: 1;Step Height: 4"    Other Standing Knee Exercises  STS from chair  x10      Knee/Hip Exercises: Supine   Quad Sets  10 reps    Heel Slides  10 reps    Knee Extension  AROM;Limitations    Knee Extension Limitations  3 was 8    Knee Flexion  AROM;Limitations    Knee Flexion Limitations  115 was 106      Manual Therapy   Manual Therapy  Myofascial release;Soft tissue mobilization    Manual therapy comments  performed seperate from other  services    Soft tissue mobilization  soft tissue work to medial and lateral knee    Myofascial Release  posterior and superior knee/scar to decrease adhesions               PT Short Term Goals - 01/17/18 1328      PT SHORT TERM GOAL #1   Title  Pt right knee flexion to be to 110 to allow pt to be comfortable sitting for 45 minutes in order to enjoy a meal.     Time  2    Period  Weeks    Status  New    Target Date  01/31/18      PT SHORT TERM GOAL #2   Title  PT to be walking inside the house without any assistive device.     Time  2    Period  Weeks    Status  New      PT SHORT TERM GOAL #3   Title  PT to be ablle to walk for 45 minutes with a cane to allow pt to shop without difficulty     Time  2    Period  Weeks    Status  New      PT SHORT TERM GOAL #4   Title  PT Rt knee pain to be no greater than a 5/10 to allow pt to be waking only one time a night.     Time  2    Period  Weeks    Status  New        PT Long Term Goals - 01/17/18 1331      PT LONG TERM GOAL #1   Title  Pt Rt knee extension to be 4 or less to allow pt to have a normalized gait pattern     Time  4    Period  Weeks    Status  New    Target Date  02/14/18      PT LONG TERM GOAL #2   Title  PT to be walking outside without a cane .    Time  4    Status  New  PT LONG TERM GOAL #3   Title  PT to be able to go up and down 14 steps in a reciprocal manner.     Time  4    Period  Weeks    Status  New      PT LONG TERM GOAL #4   Title  PT right knee pain to be no greater than a 2/10 to allow pt to sleep throughout the night.     Time  4    Period  Weeks    Status  New      PT LONG TERM GOAL #5   Title  PT Rt knee flexion to be to 115 to allow pt to ride in a car for up to 2 hours for traveling.     Time  4    Period  Weeks    Status  New            Plan - 01/25/18 1116    Clinical Impression Statement  continued with focus on improving ROM and functional strength.   Added manual to knee prior to measuring ROM with noted improvment today of 3-115 (was 8-106).  PRogressed to 4" step wtih forward and lateral step ups and lunges completed without UE assist.  Pt is overall improving and remains compliant with HEP.      Rehab Potential  Good    Clinical Impairments Affecting Rehab Potential  Pain, ROM, strength, balance, edema    PT Frequency  3x / week    PT Duration  4 weeks    PT Treatment/Interventions  ADLs/Self Care Home Management;Cryotherapy;Gait training;Stair training;Functional mobility training;Therapeutic activities;Therapeutic exercise;Balance training;Neuromuscular re-education;Patient/family education;Manual techniques;Passive range of motion    PT Next Visit Plan  Continue with exercise progression now that ROM is normalizing.  Continue with manual to decrease adhesions and soft tissue mobilty.     PT Home Exercise Plan  eval - Hamstring stretch, quad set, heel slide, bridging , heel raises, and functional squats. 01/19/2018 - step up fwd/lat, wall calf stretch, hamstring stretch standing, sit-to-stand    Consulted and Agree with Plan of Care  Patient       Patient will benefit from skilled therapeutic intervention in order to improve the following deficits and impairments:  Abnormal gait, Decreased endurance, Hypomobility, Increased edema, Decreased activity tolerance, Decreased strength, Increased fascial restricitons, Pain, Decreased balance, Difficulty walking, Decreased range of motion, Impaired flexibility  Visit Diagnosis: Stiffness of right knee  Acute pain of right knee  Difficulty in walking, not elsewhere classified     Problem List Patient Active Problem List   Diagnosis Date Noted  . S/P TKR (total knee replacement), right 12/29/17 01/10/2018  . Primary osteoarthritis of right knee   . GERD without esophagitis 03/28/2017  . Obesity (BMI 30-39.9) 03/28/2017  . Obesity 10/06/2016  . Lumbar foraminal stenosis 06/04/2016  .  Carpal tunnel syndrome, bilateral 02/07/2014  . S/P lumbar spinal fusion 02/07/2014  . Obstructive sleep apnea 02/04/2014  . Hand arthritis 10/15/2013  . HTN (hypertension), benign 08/24/2013  . Hypertriglyceridemia 08/24/2013  . Iliotibial band tendonitis 02/08/2013  . Trigger finger, acquired 02/08/2013  . Low back pain 06/22/2011  . HNP (herniated nucleus pulposus) 06/22/2011  . HIP PAIN, RIGHT 12/15/2010  . LUMBOSACRAL SPONDYLOSIS WITHOUT MYELOPATHY 12/15/2010  . SPINAL STENOSIS, LUMBAR 12/15/2010  . KNEE REPLACEMENT, HX OF 02/26/2010  . DERANGEMENT MENISCUS 07/03/2009  . KNEE, ARTHRITIS, DEGEN./OSTEO 05/22/2009  . KNEE PAIN 05/22/2009  . ANSERINE BURSITIS 05/22/2009  .  TRIGGER FINGER 09/23/2008   Teena Irani, PTA/CLT [id number]  Teena Irani 01/25/2018, 11:19 AM  [place], [place], [address] Phone: [phone]   Fax:  [phone]  Name: Emily [name] MRN: [id number] Date of Birth: 11-Jan-1951

## 2018-01-26 DIAGNOSIS — G4733 Obstructive sleep apnea (adult) (pediatric): Secondary | ICD-10-CM | POA: Diagnosis not present

## 2018-01-27 ENCOUNTER — Other Ambulatory Visit: Payer: Self-pay | Admitting: Orthopedic Surgery

## 2018-01-27 ENCOUNTER — Ambulatory Visit (HOSPITAL_COMMUNITY): Payer: PPO

## 2018-01-27 ENCOUNTER — Encounter (HOSPITAL_COMMUNITY): Payer: Self-pay

## 2018-01-27 ENCOUNTER — Telehealth: Payer: Self-pay | Admitting: Orthopedic Surgery

## 2018-01-27 DIAGNOSIS — M25661 Stiffness of right knee, not elsewhere classified: Secondary | ICD-10-CM

## 2018-01-27 DIAGNOSIS — R262 Difficulty in walking, not elsewhere classified: Secondary | ICD-10-CM

## 2018-01-27 DIAGNOSIS — M25561 Pain in right knee: Secondary | ICD-10-CM

## 2018-01-27 MED ORDER — HYDROCODONE-ACETAMINOPHEN 7.5-325 MG PO TABS: 1.0000 | ORAL_TABLET | Freq: Four times a day (QID) | ORAL | 0 refills | Status: DC | PRN

## 2018-01-27 NOTE — Therapy (Signed)
[place], [place], [address] Phone: [phone]   Fax:  [phone]  Physical Therapy Treatment  Patient Details  Name: Emily [name] MRN: [id number] Date of Birth: August 05, 1951 Referring Provider: Dr. Arther Abbott   Encounter Date: 01/27/2018  PT End of Session - 01/27/18 1306    Visit Number  4    Number of Visits  12    Date for PT Re-Evaluation  02/16/18    Authorization Type  [employer]    Authorization Time Period  02/12-3/30/    PT Start Time  1301    PT Stop Time  1344    PT Time Calculation (min)  43 min    Activity Tolerance  Patient tolerated treatment well    Behavior During Therapy  [facility] for tasks assessed/performed       Past Medical History:  Diagnosis Date  . Acid reflux   . Anxiety   . Arthritis   . CHF (congestive heart failure) (HCC)    90's swelling body-no breathing problems at that time-no cardiac -in dr s Wolfgang [place] office [place] give lasix and potassium  . Complication of anesthesia    History of being told she was allergic to something in 1980 no problems since  . Depression   . Fibromyalgia   . History of blood transfusion reaction 1978  . History of hiatal hernia   . HNP (herniated nucleus pulposus)   . Hypertension    Does not see a cardiologist no stress or echo  . Joint pain   . Nerve pain    right leg   . Ruptured lumbar disc   . Sleep apnea    Uses CPAP every night setting at 7 mm/Hg    Past Surgical History:  Procedure Laterality Date  . BACK SURGERY     x3  . BLADDER REPAIR     tear during during birth of child  . CARPAL TUNNEL RELEASE Bilateral   . CESAREAN SECTION    . CHOLECYSTECTOMY     gallbladder disease; stayed sick  . COLONOSCOPY N/A 04/28/2016   Procedure: COLONOSCOPY;  Surgeon: [name], MD;  [facility];  Service: Endoscopy;  Laterality: N/A;  . ESOPHAGOGASTRODUODENOSCOPY N/A 04/28/2016   Procedure:  ESOPHAGOGASTRODUODENOSCOPY (EGD);  Surgeon: [name], MD;  [facility];  Service: Endoscopy;  Laterality: N/A;  12:00  . GALLBLADDER SURGERY    . knee right (torn Cart)    . REPLACEMENT TOTAL KNEE     left  . ROTATOR CUFF REPAIR Bilateral    right and left  . SPINAL FUSION    . TOTAL KNEE ARTHROPLASTY Right 12/29/2017   Procedure: TOTAL KNEE ARTHROPLASTY-right knee;  Surgeon: [name], MD;  Location: [facility] ORS;  Service: Orthopedics;  Laterality: Right;  . TUBAL LIGATION    . vein right leg removed    . wrist ganglion cyst left      There were no vitals filed for this visit.  Subjective Assessment - 01/27/18 1305    Subjective  Pt stated she slept great last night and felt good this morning following the manual last session.  Reports knee is stiff today, feels related to weather.  Current pain scale 2/10 today.      Pertinent History  Osteoarthritis moderate in Right knee; TKA in Left knee; R TKA 12/29/2017    Patient Stated Goals  To have less pain and have better strength to be  able to walk without her cane .    Currently in Pain?  Yes    Pain Score  2     Pain Location  Knee    Pain Orientation  Right    Pain Descriptors / Indicators  Tightness;Sore;Aching    Pain Type  Acute pain    Pain Onset  1 to 4 weeks ago    Pain Frequency  Constant    Aggravating Factors   bending and wb    Pain Relieving Factors  ice and medication    Effect of Pain on Daily Activities  decrease ADL                      [facility] Adult PT Treatment/Exercise - 01/27/18 0001      Knee/Hip Exercises: Stretches   Active Hamstring Stretch  Right;3 reps;30 seconds    Gastroc Stretch  Right;3 reps;30 seconds slant board      Knee/Hip Exercises: Aerobic   Bike  3' at beginning for mobility, no charge      Knee/Hip Exercises: Standing   Heel Raises  15 reps    Forward Lunges  Right;10 reps;Limitations    Forward Lunges Limitations  no UE's onto 4" box    Terminal  Knee Extension  AROM;Strengthening;Right;1 set;20 reps;Theraband    Theraband Level (Terminal Knee Extension)  Level 4 (Blue)    Lateral Step Up  Right;10 reps;Step Height: 4";Hand Hold: 1    Forward Step Up  Right;10 reps;Hand Hold: 1;Step Height: 4"    SLS  Rt 26", Lt 60" 1st attempt      Knee/Hip Exercises: Supine   Short Arc Quad Sets  15 reps    Heel Slides  10 reps    Knee Extension  AROM;Limitations    Knee Extension Limitations  2    Knee Flexion  AROM;Limitations    Knee Flexion Limitations  117      Manual Therapy   Manual Therapy  Myofascial release;Soft tissue mobilization    Manual therapy comments  performed seperate from other services    Soft tissue mobilization  soft tissue work to medial and lateral knee    Myofascial Release  posterior and superior knee/scar to decrease adhesions               PT Short Term Goals - 01/17/18 1328      PT SHORT TERM GOAL #1   Title  Pt right knee flexion to be to 110 to allow pt to be comfortable sitting for 45 minutes in order to enjoy a meal.     Time  2    Period  Weeks    Status  New    Target Date  01/31/18      PT SHORT TERM GOAL #2   Title  PT to be walking inside the house without any assistive device.     Time  2    Period  Weeks    Status  New      PT SHORT TERM GOAL #3   Title  PT to be ablle to walk for 45 minutes with a cane to allow pt to shop without difficulty     Time  2    Period  Weeks    Status  New      PT SHORT TERM GOAL #4   Title  PT Rt knee pain to be no greater than a 5/10 to allow pt to be waking only one time  a night.     Time  2    Period  Weeks    Status  New        PT Long Term Goals - 01/17/18 1331      PT LONG TERM GOAL #1   Title  Pt Rt knee extension to be 4 or less to allow pt to have a normalized gait pattern     Time  4    Period  Weeks    Status  New    Target Date  02/14/18      PT LONG TERM GOAL #2   Title  PT to be walking outside without a cane .    Time   4    Status  New      PT LONG TERM GOAL #3   Title  PT to be able to go up and down 14 steps in a reciprocal manner.     Time  4    Period  Weeks    Status  New      PT LONG TERM GOAL #4   Title  PT right knee pain to be no greater than a 2/10 to allow pt to sleep throughout the night.     Time  4    Period  Weeks    Status  New      PT LONG TERM GOAL #5   Title  PT Rt knee flexion to be to 115 to allow pt to ride in a car for up to 2 hours for traveling.     Time  4    Period  Weeks    Status  New            Plan - 01/27/18 1641    Clinical Impression Statement  Session focus on end ROM and functional strengthening.  Pt continues to have edema present proximal knee, manual retro massage complete to assist with edema control prior AROM measurement of 2-117 degrees today.  Progressed functional strengtheing with lunges and step training wiht additional SLS for stability, pt ready for 6in step ups next session.  No reoprts of increased pain through session.      Rehab Potential  Good    Clinical Impairments Affecting Rehab Potential  Pain, ROM, strength, balance, edema    PT Frequency  3x / week    PT Duration  4 weeks    PT Treatment/Interventions  ADLs/Self Care Home Management;Cryotherapy;Gait training;Stair training;Functional mobility training;Therapeutic activities;Therapeutic exercise;Balance training;Neuromuscular re-education;Patient/family education;Manual techniques;Passive range of motion    PT Next Visit Plan  Continue with exercise progression now that ROM is normalizing.  Continue with manual to decrease adhesions and soft tissue mobilty. Begin 6in step up next session.      PT Home Exercise Plan  eval - Hamstring stretch, quad set, heel slide, bridging , heel raises, and functional squats. 01/19/2018 - step up fwd/lat, wall calf stretch, hamstring stretch standing, sit-to-stand       Patient will benefit from skilled therapeutic intervention in order to improve  the following deficits and impairments:  Abnormal gait, Decreased endurance, Hypomobility, Increased edema, Decreased activity tolerance, Decreased strength, Increased fascial restricitons, Pain, Decreased balance, Difficulty walking, Decreased range of motion, Impaired flexibility  Visit Diagnosis: Stiffness of right knee  Acute pain of right knee  Difficulty in walking, not elsewhere classified     Problem List Patient Active Problem List   Diagnosis Date Noted  . S/P TKR (total knee replacement), right 12/29/17 01/10/2018  .  Primary osteoarthritis of right knee   . GERD without esophagitis 03/28/2017  . Obesity (BMI 30-39.9) 03/28/2017  . Obesity 10/06/2016  . Lumbar foraminal stenosis 06/04/2016  . Carpal tunnel syndrome, bilateral 02/07/2014  . S/P lumbar spinal fusion 02/07/2014  . Obstructive sleep apnea 02/04/2014  . Hand arthritis 10/15/2013  . HTN (hypertension), benign 08/24/2013  . Hypertriglyceridemia 08/24/2013  . Iliotibial band tendonitis 02/08/2013  . Trigger finger, acquired 02/08/2013  . Low back pain 06/22/2011  . HNP (herniated nucleus pulposus) 06/22/2011  . HIP PAIN, RIGHT 12/15/2010  . LUMBOSACRAL SPONDYLOSIS WITHOUT MYELOPATHY 12/15/2010  . SPINAL STENOSIS, LUMBAR 12/15/2010  . KNEE REPLACEMENT, HX OF 02/26/2010  . DERANGEMENT MENISCUS 07/03/2009  . KNEE, ARTHRITIS, DEGEN./OSTEO 05/22/2009  . KNEE PAIN 05/22/2009  . ANSERINE BURSITIS 05/22/2009  . TRIGGER FINGER 09/23/2008   Ihor Austin, [place]; [place]  Aldona Lento 01/27/2018, 5:08 PM  [facility] [address] [place], [place], [address] Phone: [phone]   Fax:  [phone]  Name: ALZORA HA MRN: [id number] Date of Birth: Aug 26, 1951

## 2018-01-27 NOTE — Telephone Encounter (Signed)
Hydrocodone- Acetaminophen  7.5/325 mg  Qty 28 Tablets  Take 1 tablet by mouth every 6 (six) hours as needed for moderate pain.    PATIENT USES [facility].  Patient was reminded of needing to be sent in by Thursday at noon.

## 2018-01-27 NOTE — Progress Notes (Signed)
NARX SCORES  Narcotic  320  Sedative  250  Stimulant  000  Explanation and Guidance  OVERDOSE RISK SCORE  480  (Range 000-999)  Explanation and Guidance  ADDITIONAL RISK INDICATORS ( 1 )  > 100 MME total and 40 MME/day average  Explanation and Guidance

## 2018-01-30 ENCOUNTER — Ambulatory Visit (HOSPITAL_COMMUNITY): Payer: PPO | Admitting: Physical Therapy

## 2018-01-30 DIAGNOSIS — M25561 Pain in right knee: Secondary | ICD-10-CM

## 2018-01-30 DIAGNOSIS — M25661 Stiffness of right knee, not elsewhere classified: Secondary | ICD-10-CM | POA: Diagnosis not present

## 2018-01-30 DIAGNOSIS — R262 Difficulty in walking, not elsewhere classified: Secondary | ICD-10-CM

## 2018-01-30 NOTE — Therapy (Signed)
[place], [place], [address] Phone: [phone]   Fax:  [phone]  Physical Therapy Treatment  Patient Details  Name: Emily [name] MRN: [id number] Date of Birth: March 26, 1951 Referring Provider: Dr. Arther Abbott   Encounter Date: 01/30/2018  PT End of Session - 01/30/18 1104    Visit Number  5    Number of Visits  12    Date for PT Re-Evaluation  02/16/18    Authorization Type  [employer]    Authorization Time Period  02/12-3/30/    PT Start Time  1037    PT Stop Time  1115    PT Time Calculation (min)  38 min    Activity Tolerance  Patient tolerated treatment well    Behavior During Therapy  [facility] for tasks assessed/performed       Past Medical History:  Diagnosis Date  . Acid reflux   . Anxiety   . Arthritis   . CHF (congestive heart failure) (HCC)    90's swelling body-no breathing problems at that time-no cardiac -in dr s Wolfgang [place] office Jessie give lasix and potassium  . Complication of anesthesia    History of being told she was allergic to something in 1980 no problems since  . Depression   . Fibromyalgia   . History of blood transfusion reaction 1978  . History of hiatal hernia   . HNP (herniated nucleus pulposus)   . Hypertension    Does not see a cardiologist no stress or echo  . Joint pain   . Nerve pain    right leg   . Ruptured lumbar disc   . Sleep apnea    Uses CPAP every night setting at 7 mm/Hg    Past Surgical History:  Procedure Laterality Date  . BACK SURGERY     x3  . BLADDER REPAIR     tear during during birth of child  . CARPAL TUNNEL RELEASE Bilateral   . CESAREAN SECTION    . CHOLECYSTECTOMY     gallbladder disease; stayed sick  . COLONOSCOPY N/A 04/28/2016   Procedure: COLONOSCOPY;  Surgeon: [name], MD;  [facility];  Service: Endoscopy;  Laterality: N/A;  . ESOPHAGOGASTRODUODENOSCOPY N/A 04/28/2016   Procedure:  ESOPHAGOGASTRODUODENOSCOPY (EGD);  Surgeon: [name], MD;  [facility];  Service: Endoscopy;  Laterality: N/A;  12:00  . GALLBLADDER SURGERY    . knee right (torn Cart)    . REPLACEMENT TOTAL KNEE     left  . ROTATOR CUFF REPAIR Bilateral    right and left  . SPINAL FUSION    . TOTAL KNEE ARTHROPLASTY Right 12/29/2017   Procedure: TOTAL KNEE ARTHROPLASTY-right knee;  Surgeon: [name], MD;  Location: [facility];  Service: Orthopedics;  Laterality: Right;  . TUBAL LIGATION    . vein right leg removed    . wrist ganglion cyst left      There were no vitals filed for this visit.  Subjective Assessment - 01/30/18 1047    Subjective  PT states she has been having some spasms in her quad and over to ITB, unsure if related to back or knee.  Currently 2/10 pain in her knee, just aggrevation    Currently in Pain?  Yes    Pain Score  2     Pain Location  Knee    Pain Orientation  Right    Pain Descriptors / Indicators  Sore;Aching  [facility] PT Treatment/Exercise - 01/30/18 0001      Knee/Hip Exercises: Stretches   Active Hamstring Stretch  Right;3 reps;30 seconds    Gastroc Stretch  Right;3 reps;30 seconds      Knee/Hip Exercises: Aerobic   Elliptical  3' at beginning for mobility, no charge seat on 5      Knee/Hip Exercises: Standing   Forward Lunges  Right;Limitations;15 reps    Forward Lunges Limitations  no UE's onto 4" box    Lateral Step Up  Right;10 reps;Hand Hold: 1;Step Height: 6"    Forward Step Up  Right;10 reps;Hand Hold: 1;Step Height: 6"    Step Down  Right;10 reps;Hand Hold: 1;Step Height: 4"    SLS with Vectors  Rt LE 5X5" holds with 1 HHA    Other Standing Knee Exercises  STS from chair  x10 no UE      Knee/Hip Exercises: Supine   Short Arc Quad Sets  Right;10 reps    Heel Slides  10 reps    Knee Extension  AROM;Limitations    Knee Extension Limitations  2    Knee Flexion  AROM;Limitations    Knee  Flexion Limitations  117      Manual Therapy   Manual Therapy  Myofascial release;Soft tissue mobilization    Manual therapy comments  performed seperate from other services    Soft tissue mobilization  soft tissue work to medial and lateral knee    Myofascial Release  posterior and superior knee/scar to decrease adhesions               PT Short Term Goals - 01/17/18 1328      PT SHORT TERM GOAL #1   Title  Pt right knee flexion to be to 110 to allow pt to be comfortable sitting for 45 minutes in order to enjoy a meal.     Time  2    Period  Weeks    Status  New    Target Date  01/31/18      PT SHORT TERM GOAL #2   Title  PT to be walking inside the house without any assistive device.     Time  2    Period  Weeks    Status  New      PT SHORT TERM GOAL #3   Title  PT to be ablle to walk for 45 minutes with a cane to allow pt to shop without difficulty     Time  2    Period  Weeks    Status  New      PT SHORT TERM GOAL #4   Title  PT Rt knee pain to be no greater than a 5/10 to allow pt to be waking only one time a night.     Time  2    Period  Weeks    Status  New        PT Long Term Goals - 01/17/18 1331      PT LONG TERM GOAL #1   Title  Pt Rt knee extension to be 4 or less to allow pt to have a normalized gait pattern     Time  4    Period  Weeks    Status  New    Target Date  02/14/18      PT LONG TERM GOAL #2   Title  PT to be walking outside without a cane .    Time  4  Status  New      PT LONG TERM GOAL #3   Title  PT to be able to go up and down 14 steps in a reciprocal manner.     Time  4    Period  Weeks    Status  New      PT LONG TERM GOAL #4   Title  PT right knee pain to be no greater than a 2/10 to allow pt to sleep throughout the night.     Time  4    Period  Weeks    Status  New      PT LONG TERM GOAL #5   Title  PT Rt knee flexion to be to 115 to allow pt to ride in a car for up to 2 hours for traveling.     Time  4     Period  Weeks    Status  New            Plan - 01/30/18 1127    Clinical Impression Statement  continued exercise progression with increase attention to functional strengthening.    Able to increase to 6" box with forward and lateral step ups and began forward step downs using 4" step.  Vector stance also added to work on improving stability for longer SLS time.  manual completed to help decrease adhsions and edema.  ROM stayed at 2-117 degrees today.      Rehab Potential  Good    Clinical Impairments Affecting Rehab Potential  Pain, ROM, strength, balance, edema    PT Frequency  3x / week    PT Duration  4 weeks    PT Treatment/Interventions  ADLs/Self Care Home Management;Cryotherapy;Gait training;Stair training;Functional mobility training;Therapeutic activities;Therapeutic exercise;Balance training;Neuromuscular re-education;Patient/family education;Manual techniques;Passive range of motion    PT Next Visit Plan  PRogress functional strength and return to normal functioning (ROM 2-117).  Next session attempt stair negotiation and work on normalizing gait.         Patient will benefit from skilled therapeutic intervention in order to improve the following deficits and impairments:  Abnormal gait, Decreased endurance, Hypomobility, Increased edema, Decreased activity tolerance, Decreased strength, Increased fascial restricitons, Pain, Decreased balance, Difficulty walking, Decreased range of motion, Impaired flexibility  Visit Diagnosis: Stiffness of right knee  Acute pain of right knee  Difficulty in walking, not elsewhere classified     Problem List Patient Active Problem List   Diagnosis Date Noted  . S/P TKR (total knee replacement), right 12/29/17 01/10/2018  . Primary osteoarthritis of right knee   . GERD without esophagitis 03/28/2017  . Obesity (BMI 30-39.9) 03/28/2017  . Obesity 10/06/2016  . Lumbar foraminal stenosis 06/04/2016  . Carpal tunnel syndrome, bilateral  02/07/2014  . S/P lumbar spinal fusion 02/07/2014  . Obstructive sleep apnea 02/04/2014  . Hand arthritis 10/15/2013  . HTN (hypertension), benign 08/24/2013  . Hypertriglyceridemia 08/24/2013  . Iliotibial band tendonitis 02/08/2013  . Trigger finger, acquired 02/08/2013  . Low back pain 06/22/2011  . HNP (herniated nucleus pulposus) 06/22/2011  . HIP PAIN, RIGHT 12/15/2010  . LUMBOSACRAL SPONDYLOSIS WITHOUT MYELOPATHY 12/15/2010  . SPINAL STENOSIS, LUMBAR 12/15/2010  . KNEE REPLACEMENT, HX OF 02/26/2010  . DERANGEMENT MENISCUS 07/03/2009  . KNEE, ARTHRITIS, DEGEN./OSTEO 05/22/2009  . KNEE PAIN 05/22/2009  . ANSERINE BURSITIS 05/22/2009  . TRIGGER FINGER 09/23/2008   Teena Irani, PTA/CLT [phone]  Roseanne Reno B 01/30/2018, 11:41 AM  [place]  [place], [place], [address] Phone: [phone]   Fax:  [phone]  Name: Emily [name] MRN: [id number] Date of Birth: [birth year]

## 2018-02-01 ENCOUNTER — Encounter (HOSPITAL_COMMUNITY): Payer: Self-pay | Admitting: Physical Therapy

## 2018-02-01 ENCOUNTER — Other Ambulatory Visit: Payer: Self-pay

## 2018-02-01 ENCOUNTER — Ambulatory Visit (HOSPITAL_COMMUNITY): Payer: PPO | Admitting: Physical Therapy

## 2018-02-01 DIAGNOSIS — R262 Difficulty in walking, not elsewhere classified: Secondary | ICD-10-CM

## 2018-02-01 DIAGNOSIS — M25661 Stiffness of right knee, not elsewhere classified: Secondary | ICD-10-CM | POA: Diagnosis not present

## 2018-02-01 DIAGNOSIS — M25561 Pain in right knee: Secondary | ICD-10-CM

## 2018-02-01 NOTE — Therapy (Signed)
[place], [place], [address] Phone: [phone]   Fax:  [phone]  Physical Therapy Treatment  Patient Details  Name: Emily [name] MRN: [id number] Date of Birth: [birth year] Referring Provider: Dr. Arther Abbott   Encounter Date: 02/01/2018  PT End of Session - 02/01/18 1009    Visit Number  6    Number of Visits  12    Date for PT Re-Evaluation  02/16/18    Authorization Type  [employer]    Authorization Time Period  02/12-3/30/    Authorization - Visit Number  6    Authorization - Number of Visits  10    PT Start Time  [phone]    PT Stop Time  1034    PT Time Calculation (min)  42 min    Activity Tolerance  Patient tolerated treatment well    Behavior During Therapy  [facility] for tasks assessed/performed       Past Medical History:  Diagnosis Date  . Acid reflux   . Anxiety   . Arthritis   . CHF (congestive heart failure) (HCC)    90's swelling body-no breathing problems at that time-no cardiac -in dr s [place] office [place] give lasix and potassium  . Complication of anesthesia    History of being told she was allergic to something in 1980 no problems since  . Depression   . Fibromyalgia   . History of blood transfusion reaction 1978  . History of hiatal hernia   . HNP (herniated nucleus pulposus)   . Hypertension    Does not see a cardiologist no stress or echo  . Joint pain   . Nerve pain    right leg   . Ruptured lumbar disc   . Sleep apnea    Uses CPAP every night setting at 7 mm/Hg    Past Surgical History:  Procedure Laterality Date  . BACK SURGERY     x3  . BLADDER REPAIR     tear during during birth of child  . CARPAL TUNNEL RELEASE Bilateral   . CESAREAN SECTION    . CHOLECYSTECTOMY     gallbladder disease; stayed sick  . COLONOSCOPY N/A 04/28/2016   Procedure: COLONOSCOPY;  Surgeon: [name], MD;  [facility];  Service: Endoscopy;  Laterality:  N/A;  . ESOPHAGOGASTRODUODENOSCOPY N/A 04/28/2016   Procedure: ESOPHAGOGASTRODUODENOSCOPY (EGD);  Surgeon: [name], MD;  [facility];  Service: Endoscopy;  Laterality: N/A;  12:00  . GALLBLADDER SURGERY    . knee right (torn Cart)    . REPLACEMENT TOTAL KNEE     left  . ROTATOR CUFF REPAIR Bilateral    right and left  . SPINAL FUSION    . TOTAL KNEE ARTHROPLASTY Right 12/29/2017   Procedure: TOTAL KNEE ARTHROPLASTY-right knee;  Surgeon: [name], MD;  Location: [facility];  Service: Orthopedics;  Laterality: Right;  . TUBAL LIGATION    . vein right leg removed    . wrist ganglion cyst left      There were no vitals filed for this visit.  Subjective Assessment - 02/01/18 0957    Subjective  Pt states that she is doing well.  She still is having swelling and difficuty with steps. She is icing at least 3 x a day.    Currently in Pain?  Yes    Pain Score  1     Pain Location  Knee  Pain Orientation  Right    Pain Descriptors / Indicators  Aching    Pain Onset  1 to 4 weeks ago    Pain Frequency  Intermittent    Aggravating Factors   bending     Pain Relieving Factors  ice     Effect of Pain on Daily Activities  limits     Multiple Pain Sites  No                      [facility] Adult PT Treatment/Exercise - 02/01/18 0001      Knee/Hip Exercises: Stretches   Active Hamstring Stretch  Right;3 reps;30 seconds    Active Hamstring Stretch Limitations  supine with rope    Passive Hamstring Stretch  3 reps;30 seconds 12 " stool    Knee: Self-Stretch to increase Flexion  Right;10 seconds;Limitations    Knee: Self-Stretch Limitations  10 reps onto 12" box    Gastroc Stretch  Right;3 reps;30 seconds      Knee/Hip Exercises: Standing   Heel Raises  15 reps    Knee Flexion  10 reps;Limitations    Knee Flexion Limitations  3#    Forward Lunges  Right;Limitations;15 reps    Forward Lunges Limitations  bosu     Lateral Step Up  Right;10 reps;Hand  Hold: 1;Step Height: 6"    Forward Step Up  Right;10 reps;Hand Hold: 1;Step Height: 6"    Step Down  Right;10 reps;Hand Hold: 1;Step Height: 6"    Stairs  2 RT     Rocker Board  2 minutes    SLS with Vectors  Rt LE 5X10" holds with 1 HHA      Knee/Hip Exercises: Seated   Sit to Sand  8 reps      Knee/Hip Exercises: Supine   Knee Extension Limitations  0 with verbal encouragement.    Knee Flexion Limitations  119 with verbal encouragement.               PT Short Term Goals - 01/17/18 1328      PT SHORT TERM GOAL #1   Title  Pt right knee flexion to be to 110 to allow pt to be comfortable sitting for 45 minutes in order to enjoy a meal.     Time  2    Period  Weeks    Status  New    Target Date  01/31/18      PT SHORT TERM GOAL #2   Title  PT to be walking inside the house without any assistive device.     Time  2    Period  Weeks    Status  New      PT SHORT TERM GOAL #3   Title  PT to be ablle to walk for 45 minutes with a cane to allow pt to shop without difficulty     Time  2    Period  Weeks    Status  New      PT SHORT TERM GOAL #4   Title  PT Rt knee pain to be no greater than a 5/10 to allow pt to be waking only one time a night.     Time  2    Period  Weeks    Status  New        PT Long Term Goals - 01/17/18 1331      PT LONG TERM GOAL #1   Title  Pt Rt knee extension  to be 4 or less to allow pt to have a normalized gait pattern     Time  4    Period  Weeks    Status  New    Target Date  02/14/18      PT LONG TERM GOAL #2   Title  PT to be walking outside without a cane .    Time  4    Status  New      PT LONG TERM GOAL #3   Title  PT to be able to go up and down 14 steps in a reciprocal manner.     Time  4    Period  Weeks    Status  New      PT LONG TERM GOAL #4   Title  PT right knee pain to be no greater than a 2/10 to allow pt to sleep throughout the night.     Time  4    Period  Weeks    Status  New      PT LONG TERM GOAL #5    Title  PT Rt knee flexion to be to 115 to allow pt to ride in a car for up to 2 hours for traveling.     Time  4    Period  Weeks    Status  New            Plan - 02/01/18 1030    Clinical Impression Statement  Pt continues to improve with ROM; able to achieve 0-119 with encouragement.  Began reciprocal stair climbing with good technique with verbal cuing.  Increased vector stance to improve balance.     Rehab Potential  Good    Clinical Impairments Affecting Rehab Potential  Pain, ROM, strength, balance, edema    PT Frequency  3x / week    PT Duration  4 weeks    PT Treatment/Interventions  ADLs/Self Care Home Management;Cryotherapy;Gait training;Stair training;Functional mobility training;Therapeutic activities;Therapeutic exercise;Balance training;Neuromuscular re-education;Patient/family education;Manual techniques;Passive range of motion    PT Next Visit Plan  Continue steps        Patient will benefit from skilled therapeutic intervention in order to improve the following deficits and impairments:  Abnormal gait, Decreased endurance, Hypomobility, Increased edema, Decreased activity tolerance, Decreased strength, Increased fascial restricitons, Pain, Decreased balance, Difficulty walking, Decreased range of motion, Impaired flexibility  Visit Diagnosis: Stiffness of right knee  Acute pain of right knee  Difficulty in walking, not elsewhere classified     Problem List Patient Active Problem List   Diagnosis Date Noted  . S/P TKR (total knee replacement), right 12/29/17 01/10/2018  . Primary osteoarthritis of right knee   . GERD without esophagitis 03/28/2017  . Obesity (BMI 30-39.9) 03/28/2017  . Obesity 10/06/2016  . Lumbar foraminal stenosis 06/04/2016  . Carpal tunnel syndrome, bilateral 02/07/2014  . S/P lumbar spinal fusion 02/07/2014  . Obstructive sleep apnea 02/04/2014  . Hand arthritis 10/15/2013  . HTN (hypertension), benign 08/24/2013  .  Hypertriglyceridemia 08/24/2013  . Iliotibial band tendonitis 02/08/2013  . Trigger finger, acquired 02/08/2013  . Low back pain 06/22/2011  . HNP (herniated nucleus pulposus) 06/22/2011  . HIP PAIN, RIGHT 12/15/2010  . LUMBOSACRAL SPONDYLOSIS WITHOUT MYELOPATHY 12/15/2010  . SPINAL STENOSIS, LUMBAR 12/15/2010  . KNEE REPLACEMENT, HX OF 02/26/2010  . DERANGEMENT MENISCUS 07/03/2009  . KNEE, ARTHRITIS, DEGEN./OSTEO 05/22/2009  . KNEE PAIN 05/22/2009  . ANSERINE BURSITIS 05/22/2009  . TRIGGER FINGER 09/23/2008  Rayetta Humphrey, [place] CLT [phone]  02/01/2018, 10:33 AM  [facility] [address] [place], [place], [address] Phone: [phone]   Fax:  [phone]  Name: Emily [name] MRN: [id number] Date of Birth: [birth year]

## 2018-02-03 ENCOUNTER — Ambulatory Visit (HOSPITAL_COMMUNITY): Payer: PPO | Attending: Family Medicine

## 2018-02-03 ENCOUNTER — Encounter (HOSPITAL_COMMUNITY): Payer: Self-pay

## 2018-02-03 DIAGNOSIS — R262 Difficulty in walking, not elsewhere classified: Secondary | ICD-10-CM | POA: Diagnosis not present

## 2018-02-03 DIAGNOSIS — M25661 Stiffness of right knee, not elsewhere classified: Secondary | ICD-10-CM | POA: Diagnosis not present

## 2018-02-03 DIAGNOSIS — M25561 Pain in right knee: Secondary | ICD-10-CM | POA: Insufficient documentation

## 2018-02-03 NOTE — Therapy (Signed)
[place], [place], [address] Phone: [phone]   Fax:  [phone]  Physical Therapy Treatment  Patient Details  Name: Emily [name] MRN: [id number] Date of Birth: 21-Dec-1950 Referring Provider: Dr. Arther Abbott   Encounter Date: 02/03/2018  PT End of Session - 02/03/18 0901    Visit Number  7    Number of Visits  12    Date for PT Re-Evaluation  02/16/18    Authorization Type  [employer]    Authorization Time Period  02/12-3/30/    Authorization - Visit Number  7    Authorization - Number of Visits  10    PT Start Time  0858    PT Stop Time  0940    PT Time Calculation (min)  42 min    Activity Tolerance  Patient tolerated treatment well    Behavior During Therapy  [facility] for tasks assessed/performed       Past Medical History:  Diagnosis Date  . Acid reflux   . Anxiety   . Arthritis   . CHF (congestive heart failure) (HCC)    90's swelling body-no breathing problems at that time-no cardiac -in dr s Wolfgang [place] office [place] give lasix and potassium  . Complication of anesthesia    History of being told she was allergic to something in 1980 no problems since  . Depression   . Fibromyalgia   . History of blood transfusion reaction 1978  . History of hiatal hernia   . HNP (herniated nucleus pulposus)   . Hypertension    Does not see a cardiologist no stress or echo  . Joint pain   . Nerve pain    right leg   . Ruptured lumbar disc   . Sleep apnea    Uses CPAP every night setting at 7 mm/Hg    Past Surgical History:  Procedure Laterality Date  . BACK SURGERY     x3  . BLADDER REPAIR     tear during during birth of child  . CARPAL TUNNEL RELEASE Bilateral   . CESAREAN SECTION    . CHOLECYSTECTOMY     gallbladder disease; stayed sick  . COLONOSCOPY N/A 04/28/2016   Procedure: COLONOSCOPY;  Surgeon: [name], MD;  [facility];  Service: Endoscopy;  Laterality:  N/A;  . ESOPHAGOGASTRODUODENOSCOPY N/A 04/28/2016   Procedure: ESOPHAGOGASTRODUODENOSCOPY (EGD);  Surgeon: [name], MD;  Location: [facility];  Service: Endoscopy;  Laterality: N/A;  12:00  . GALLBLADDER SURGERY    . knee right (torn Cart)    . REPLACEMENT TOTAL KNEE     left  . ROTATOR CUFF REPAIR Bilateral    right and left  . SPINAL FUSION    . TOTAL KNEE ARTHROPLASTY Right 12/29/2017   Procedure: TOTAL KNEE ARTHROPLASTY-right knee;  Surgeon: [name], MD;  Location: [facility];  Service: Orthopedics;  Laterality: Right;  . TUBAL LIGATION    . vein right leg removed    . wrist ganglion cyst left      There were no vitals filed for this visit.  Subjective Assessment - 02/03/18 0900    Subjective  Pt stated her knee is feeling good today, reports the swelling has reduced this week and minimal pain.  Continues compliance with HEP daily and icing.    Pertinent History  Osteoarthritis moderate in Right knee; TKA in Left knee; R TKA 12/29/2017    Patient Stated Goals  To have less pain and have better strength to be able to walk without her cane .    Currently in Pain?  No/denies                      [facility] Adult PT Treatment/Exercise - 02/03/18 0001      Knee/Hip Exercises: Stretches   Active Hamstring Stretch  Right;3 reps;30 seconds    Active Hamstring Stretch Limitations  supine with rope    Knee: Self-Stretch to increase Flexion  Right;10 seconds;Limitations    Knee: Self-Stretch Limitations  10 reps onto 12" box    Gastroc Stretch  Right;3 reps;30 seconds slant board      Knee/Hip Exercises: Machines for Strengthening   Total Gym Leg Press  2x 15 reps for functional squat, 29* angle      Knee/Hip Exercises: Standing   Heel Raises  15 reps    Knee Flexion  15 reps    Knee Flexion Limitations  3#    Forward Lunges  Both;15 reps    Forward Lunges Limitations  bosu with minimal UE A    Lateral Step Up  Right;15 reps;Hand Hold: 1;Step Height: 6"     Forward Step Up  Right;15 reps;Hand Hold: 0;Step Height: 6"    Step Down  Right;15 reps;Hand Hold: 1;Step Height: 6" eccentric control, cueing to reduce HHA    Functional Squat  15 reps    Functional Squat Limitations  minimal reports of pain, cueing for equal stance    Stairs  5RT reciprocal pattern 1HR descending    SLS with Vectors  Rt LE 5X10" holds on foam with 1 HHA      Knee/Hip Exercises: Seated   Sit to Sand  10 reps;without UE support      Knee/Hip Exercises: Supine   Knee Extension  AROM    Knee Extension Limitations  0    Knee Flexion  AROM    Knee Flexion Limitations  120               PT Short Term Goals - 01/17/18 1328      PT SHORT TERM GOAL #1   Title  Pt right knee flexion to be to 110 to allow pt to be comfortable sitting for 45 minutes in order to enjoy a meal.     Time  2    Period  Weeks    Status  New    Target Date  01/31/18      PT SHORT TERM GOAL #2   Title  PT to be walking inside the house without any assistive device.     Time  2    Period  Weeks    Status  New      PT SHORT TERM GOAL #3   Title  PT to be ablle to walk for 45 minutes with a cane to allow pt to shop without difficulty     Time  2    Period  Weeks    Status  New      PT SHORT TERM GOAL #4   Title  PT Rt knee pain to be no greater than a 5/10 to allow pt to be waking only one time a night.     Time  2    Period  Weeks    Status  New        PT Long Term Goals - 01/17/18 1331      PT LONG TERM GOAL #1  Title  Pt Rt knee extension to be 4 or less to allow pt to have a normalized gait pattern     Time  4    Period  Weeks    Status  New    Target Date  02/14/18      PT LONG TERM GOAL #2   Title  PT to be walking outside without a cane .    Time  4    Status  New      PT LONG TERM GOAL #3   Title  PT to be able to go up and down 14 steps in a reciprocal manner.     Time  4    Period  Weeks    Status  New      PT LONG TERM GOAL #4   Title  PT right  knee pain to be no greater than a 2/10 to allow pt to sleep throughout the night.     Time  4    Period  Weeks    Status  New      PT LONG TERM GOAL #5   Title  PT Rt knee flexion to be to 115 to allow pt to ride in a car for up to 2 hours for traveling.     Time  4    Period  Weeks    Status  New            Plan - 02/03/18 7169    Clinical Impression Statement  Pt progressing well with AROM (0-120 degrees) and functional strengthening.  Continues to demonstrate difficulty/decreased strength with eccentric control descending stairs.  Added dynamic surfaces with vector stance to improve balance.    Rehab Potential  Good    Clinical Impairments Affecting Rehab Potential  Pain, ROM, strength, balance, edema    PT Frequency  3x / week    PT Duration  4 weeks    PT Treatment/Interventions  ADLs/Self Care Home Management;Cryotherapy;Gait training;Stair training;Functional mobility training;Therapeutic activities;Therapeutic exercise;Balance training;Neuromuscular re-education;Patient/family education;Manual techniques;Passive range of motion    PT Next Visit Plan  Continue steps and balance activities    PT Home Exercise Plan  eval - Hamstring stretch, quad set, heel slide, bridging , heel raises, and functional squats. 01/19/2018 - step up fwd/lat, wall calf stretch, hamstring stretch standing, sit-to-stand       Patient will benefit from skilled therapeutic intervention in order to improve the following deficits and impairments:  Abnormal gait, Decreased endurance, Hypomobility, Increased edema, Decreased activity tolerance, Decreased strength, Increased fascial restricitons, Pain, Decreased balance, Difficulty walking, Decreased range of motion, Impaired flexibility  Visit Diagnosis: Stiffness of right knee  Acute pain of right knee  Difficulty in walking, not elsewhere classified     Problem List Patient Active Problem List   Diagnosis Date Noted  . S/P TKR (total knee  replacement), right 12/29/17 01/10/2018  . Primary osteoarthritis of right knee   . GERD without esophagitis 03/28/2017  . Obesity (BMI 30-39.9) 03/28/2017  . Obesity 10/06/2016  . Lumbar foraminal stenosis 06/04/2016  . Carpal tunnel syndrome, bilateral 02/07/2014  . S/P lumbar spinal fusion 02/07/2014  . Obstructive sleep apnea 02/04/2014  . Hand arthritis 10/15/2013  . HTN (hypertension), benign 08/24/2013  . Hypertriglyceridemia 08/24/2013  . Iliotibial band tendonitis 02/08/2013  . Trigger finger, acquired 02/08/2013  . Low back pain 06/22/2011  . HNP (herniated nucleus pulposus) 06/22/2011  . HIP PAIN, RIGHT 12/15/2010  . LUMBOSACRAL SPONDYLOSIS WITHOUT MYELOPATHY 12/15/2010  .  SPINAL STENOSIS, LUMBAR 12/15/2010  . KNEE REPLACEMENT, HX OF 02/26/2010  . DERANGEMENT MENISCUS 07/03/2009  . KNEE, ARTHRITIS, DEGEN./OSTEO 05/22/2009  . KNEE PAIN 05/22/2009  . ANSERINE BURSITIS 05/22/2009  . TRIGGER FINGER 09/23/2008   Ihor Austin, [place]; [place]  Aldona Lento 02/03/2018, 9:47 AM  [place], [place], [address] Phone: [phone]   Fax:  [phone]  Name: Emily [name] MRN: [id number] Date of Birth: 06-13-1951

## 2018-02-06 ENCOUNTER — Telehealth (HOSPITAL_COMMUNITY): Payer: Self-pay | Admitting: Family Medicine

## 2018-02-06 ENCOUNTER — Other Ambulatory Visit: Payer: Self-pay | Admitting: *Deleted

## 2018-02-06 ENCOUNTER — Ambulatory Visit (HOSPITAL_COMMUNITY): Payer: PPO | Admitting: Physical Therapy

## 2018-02-06 MED ORDER — PRAVASTATIN SODIUM 20 MG PO TABS: 20.0000 mg | ORAL_TABLET | Freq: Every day | ORAL | 0 refills | Status: DC

## 2018-02-06 NOTE — Telephone Encounter (Signed)
02/06/18  pt left a message that she was sick today

## 2018-02-08 ENCOUNTER — Ambulatory Visit (HOSPITAL_COMMUNITY): Payer: PPO | Admitting: Physical Therapy

## 2018-02-08 ENCOUNTER — Other Ambulatory Visit: Payer: Self-pay

## 2018-02-08 ENCOUNTER — Encounter (HOSPITAL_COMMUNITY): Payer: Self-pay | Admitting: Physical Therapy

## 2018-02-08 ENCOUNTER — Telehealth (HOSPITAL_COMMUNITY): Payer: Self-pay | Admitting: Physical Therapy

## 2018-02-08 DIAGNOSIS — R262 Difficulty in walking, not elsewhere classified: Secondary | ICD-10-CM

## 2018-02-08 DIAGNOSIS — M25661 Stiffness of right knee, not elsewhere classified: Secondary | ICD-10-CM | POA: Diagnosis not present

## 2018-02-08 NOTE — Therapy (Signed)
[place], [place], [address] Phone: [phone]   Fax:  [phone]  Physical Therapy Treatment/ Discharge  Patient Details  Name: Emily [name] MRN: [id number] Date of Birth: [birth year] Referring Provider: Arther Abbott    Encounter Date: 02/08/2018      ROM:  3-118;  Able to ambulate for an hour without an assistive device.     PT End of Session - 02/08/18 1016    Visit Number  8    Number of Visits  8    Date for PT Re-Evaluation  02/16/18    Authorization Type  [employer]    Authorization Time Period  02/12-3/30/    Authorization - Visit Number  8    Authorization - Number of Visits  8    PT Start Time  0950    PT Stop Time  1012    PT Time Calculation (min)  22 min    Activity Tolerance  Patient tolerated treatment well    Behavior During Therapy  [facility] for tasks assessed/performed       Past Medical History:  Diagnosis Date  . Acid reflux   . Anxiety   . Arthritis   . CHF (congestive heart failure) (HCC)    90's swelling body-no breathing problems at that time-no cardiac -in dr s Wolfgang [place] office [place] give lasix and potassium  . Complication of anesthesia    History of being told she was allergic to something in 1980 no problems since  . Depression   . Fibromyalgia   . History of blood transfusion reaction 1978  . History of hiatal hernia   . HNP (herniated nucleus pulposus)   . Hypertension    Does not see a cardiologist no stress or echo  . Joint pain   . Nerve pain    right leg   . Ruptured lumbar disc   . Sleep apnea    Uses CPAP every night setting at 7 mm/Hg    Past Surgical History:  Procedure Laterality Date  . BACK SURGERY     x3  . BLADDER REPAIR     tear during during birth of child  . CARPAL TUNNEL RELEASE Bilateral   . CESAREAN SECTION    . CHOLECYSTECTOMY     gallbladder disease; stayed sick  . COLONOSCOPY N/A 04/28/2016   Procedure: COLONOSCOPY;   Surgeon: [name], MD;  [facility];  Service: Endoscopy;  Laterality: N/A;  . ESOPHAGOGASTRODUODENOSCOPY N/A 04/28/2016   Procedure: ESOPHAGOGASTRODUODENOSCOPY (EGD);  Surgeon: [name], MD;  [facility];  Service: Endoscopy;  Laterality: N/A;  12:00  . GALLBLADDER SURGERY    . knee right (torn Cart)    . REPLACEMENT TOTAL KNEE     left  . ROTATOR CUFF REPAIR Bilateral    right and left  . SPINAL FUSION    . TOTAL KNEE ARTHROPLASTY Right 12/29/2017   Procedure: TOTAL KNEE ARTHROPLASTY-right knee;  Surgeon: [name], MD;  Location: [facility];  Service: Orthopedics;  Laterality: Right;  . TUBAL LIGATION    . vein right leg removed    . wrist ganglion cyst left      There were no vitals filed for this visit.  Subjective Assessment - 02/08/18 0951    Subjective  Pain is better she is now only taking pain medication in the morning.      Pertinent History  Osteoarthritis moderate in Right knee; TKA in  Left knee; R TKA 12/29/2017    How long can you sit comfortably?  60 minutes was 10-15    How long can you stand comfortably?  no problem was 20 minutes     How long can you walk comfortably?  an hour without cane; was 5-10 minutes with her cane     Patient Stated Goals  To have less pain and have better strength to be able to walk without her cane .    Currently in Pain?  No/denies Greatest pain in the past week has been a 3/10 in her knee          [facility] PT Assessment - 02/08/18 0001      Assessment   Medical Diagnosis  Rt TKR    Referring Provider  Arther Abbott     Onset Date/Surgical Date  12/29/17    Next MD Visit  02/10/2018    Prior Therapy  HH      Precautions   Precautions  None      Restrictions   Weight Bearing Restrictions  No      Home Environment   Living Environment  Private residence      Prior Function   Level of Independence  Independent    Vocation  Retired    Loss adjuster, chartered,     Leisure   sewing needs to go up 15 steps       Cognition   Overall Cognitive Status  Within Functional Limits for tasks assessed      Observation/Other Assessments   Observations  Unable to assess fully due to clothing restrictions    Focus on Therapeutic Outcomes (FOTO)   37      Circumferential Edema   Circumferential - Right  48.5 8 cm above knee; 43 cm 8 cm below knee    Circumferential - Left   47 cm 8 cm above knee; 42 cm 8 cm below knee      Sensation   Light Touch  Appears Intact Patient reports numbness on upper left thigh from TKA      Functional Tests   Functional tests  Single leg stance;Sit to Stand      Single Leg Stance   Comments  Lt:  60;  RT 60       Sit to Stand   Comments  5 x 11.15 was 24.45       AROM   Right Knee Extension  3 was 8     Right Knee Flexion  118 prior to TKR was 118    Left Knee Extension  1 1 degree from 0    Left Knee Flexion  115      Strength   Right Hip Flexion  5/5    Right Hip Extension  5/5    Right Hip ABduction  5/5 was 4-    Right Hip ADduction  5/5 was 4+     Left Hip Flexion  5/5    Left Hip Extension  5/5 was 4+     Left Hip ABduction  5/5    Left Hip ADduction  5/5    Right Knee Flexion  5/5    Right Knee Extension  5/5    Left Knee Flexion  5/5    Left Knee Extension  5/5 was a 4+/5     Right Ankle Dorsiflexion  5/5    Right Ankle Plantar Flexion  5/5    Left Ankle Dorsiflexion  5/5    Left  Ankle Plantar Flexion  5/5      Flexibility   Hamstrings  mild limitation on left LE, moderate limitaiton on right LE      Palpation   Palpation comment  --      Special Tests    Special Tests  --    Knee Special tests   --      other    Findings  --    Side   --    Comments  --      Transfers   Five time sit to stand comments   27 seconds due to pain anterolateral aspect of right knee patient slow and antalgic      Ambulation/Gait   Ambulation Distance (Feet)  525 Feet    Assistive device  None    Gait Pattern   Decreased step length - left;Right flexed knee in stance;Antalgic    Gait Comments  work on heel/toe gait pattern with equal length steps      6 minute walk test results    Aerobic Endurance Distance Walked  692    Endurance additional comments  3MWT       Static Standing Balance   Static Standing - Balance Support  No upper extremity supported    Static Standing - Level of Assistance  7: Independent    Static Standing Balance -  Activities   Single Leg Stance - Right Leg;Single Leg Stance - Left Leg      Timed Up and Go Test   TUG  Normal TUG    Normal TUG (seconds)  12.4    TUG Comments  The norm for patient's age is 8.1 seconds curretn time indicate decreased fall risk however still slow                  [facility] Adult PT Treatment/Exercise - 02/08/18 0001      Knee/Hip Exercises: Stretches   Active Hamstring Stretch  Right;3 reps;30 seconds      Knee/Hip Exercises: Supine   Quad Sets  10 reps             PT Education - 02/08/18 1015    Education provided  Yes    Education Details  Explained to pt that she needed to work on heel toe gait while walking as well as continuing to work on extension.      Person(s) Educated  Patient    Methods  Explanation    Comprehension  Verbalized understanding       PT Short Term Goals - 02/08/18 1010      PT SHORT TERM GOAL #1   Title  Pt right knee flexion to be to 110 to allow pt to be comfortable sitting for 45 minutes in order to enjoy a meal.     Time  2    Period  Weeks    Status  Achieved      PT SHORT TERM GOAL #2   Title  PT to be walking inside the house without any assistive device.     Time  2    Period  Weeks    Status  Achieved      PT SHORT TERM GOAL #3   Title  PT to be ablle to walk for 45 minutes with a cane to allow pt to shop without difficulty     Time  2    Period  Weeks    Status  Achieved      PT SHORT TERM  GOAL #4   Title  PT Rt knee pain to be no greater than a 5/10 to allow pt to be  waking only one time a night.     Time  2    Period  Weeks    Status  Achieved        PT Long Term Goals - 02/08/18 1010      PT LONG TERM GOAL #1   Title  Pt Rt knee extension to be 4 or less to allow pt to have a normalized gait pattern     Time  4    Period  Weeks    Status  Achieved      PT LONG TERM GOAL #2   Title  PT to be walking outside without a cane .    Time  4    Status  Achieved      PT LONG TERM GOAL #3   Title  PT to be able to go up and down 14 steps in a reciprocal manner.     Time  4    Period  Weeks    Status  Achieved      PT LONG TERM GOAL #4   Title  PT right knee pain to be no greater than a 2/10 to allow pt to sleep throughout the night.     Time  4    Period  Weeks    Status  Achieved      PT LONG TERM GOAL #5   Title  PT Rt knee flexion to be to 115 to allow pt to ride in a car for up to 2 hours for traveling.     Time  4    Period  Weeks    Status  Achieved            Plan - 02/08/18 1017    Clinical Impression Statement  Pt reassessed all goals have been met and pt is ready for discharge.     Rehab Potential  Good    Clinical Impairments Affecting Rehab Potential  Pain, ROM, strength, balance, edema    PT Frequency  3x / week    PT Duration  4 weeks    PT Treatment/Interventions  ADLs/Self Care Home Management;Cryotherapy;Gait training;Stair training;Functional mobility training;Therapeutic activities;Therapeutic exercise;Balance training;Neuromuscular re-education;Patient/family education;Manual techniques;Passive range of motion    PT Next Visit Plan  Discharge.     PT Home Exercise Plan  eval - Hamstring stretch, quad set, heel slide, bridging , heel raises, and functional squats. 01/19/2018 - step up fwd/lat, wall calf stretch, hamstring stretch standing, sit-to-stand       Patient will benefit from skilled therapeutic intervention in order to improve the following deficits and impairments:  Abnormal gait, Decreased endurance,  Hypomobility, Increased edema, Decreased activity tolerance, Decreased strength, Increased fascial restricitons, Pain, Decreased balance, Difficulty walking, Decreased range of motion, Impaired flexibility  Visit Diagnosis: Stiffness of right knee  Difficulty in walking, not elsewhere classified     Problem List Patient Active Problem List   Diagnosis Date Noted  . S/P TKR (total knee replacement), right 12/29/17 01/10/2018  . Primary osteoarthritis of right knee   . GERD without esophagitis 03/28/2017  . Obesity (BMI 30-39.9) 03/28/2017  . Obesity 10/06/2016  . Lumbar foraminal stenosis 06/04/2016  . Carpal tunnel syndrome, bilateral 02/07/2014  . S/P lumbar spinal fusion 02/07/2014  . Obstructive sleep apnea 02/04/2014  . Hand arthritis 10/15/2013  . HTN (hypertension), benign 08/24/2013  . Hypertriglyceridemia 08/24/2013  .  Iliotibial band tendonitis 02/08/2013  . Trigger finger, acquired 02/08/2013  . Low back pain 06/22/2011  . HNP (herniated nucleus pulposus) 06/22/2011  . HIP PAIN, RIGHT 12/15/2010  . LUMBOSACRAL SPONDYLOSIS WITHOUT MYELOPATHY 12/15/2010  . SPINAL STENOSIS, LUMBAR 12/15/2010  . KNEE REPLACEMENT, HX OF 02/26/2010  . DERANGEMENT MENISCUS 07/03/2009  . KNEE, ARTHRITIS, DEGEN./OSTEO 05/22/2009  . KNEE PAIN 05/22/2009  . ANSERINE BURSITIS 05/22/2009  . TRIGGER FINGER 09/23/2008    Rayetta Humphrey, PT CLT [phone] 02/08/2018, 10:21 AM  [place], [place], [address] Phone: [phone]   Fax:  [phone]  Name: Emily [name] MRN: [id number] Date of Birth: [birth year]  PHYSICAL THERAPY DISCHARGE SUMMARY  Visits from Start of Care: 8  Current functional level related to goals / functional outcomes: See above    Remaining deficits: See above    Education / Equipment: HEP Plan: Patient agrees to discharge.  Patient goals were met. Patient is being discharged due to meeting  the stated rehab goals.  ?????       Rayetta Humphrey, [place] CLT [phone]

## 2018-02-08 NOTE — Telephone Encounter (Signed)
Patient was discharged by PT Azucena Freed )

## 2018-02-10 ENCOUNTER — Encounter (HOSPITAL_COMMUNITY): Payer: PPO

## 2018-02-10 ENCOUNTER — Ambulatory Visit (INDEPENDENT_AMBULATORY_CARE_PROVIDER_SITE_OTHER): Payer: PPO | Admitting: Orthopedic Surgery

## 2018-02-10 VITALS — BP 145/91 | HR 76 | Ht 62.0 in | Wt 176.0 lb

## 2018-02-10 DIAGNOSIS — Z96651 Presence of right artificial knee joint: Secondary | ICD-10-CM

## 2018-02-10 MED ORDER — HYDROCODONE-ACETAMINOPHEN 7.5-325 MG PO TABS: 1.0000 | ORAL_TABLET | Freq: Three times a day (TID) | ORAL | 0 refills | Status: DC | PRN

## 2018-02-10 NOTE — Progress Notes (Signed)
POST OP VISIT   Patient ID: Emily [name], female   DOB: 12-Mar-1951, 67 y.o.   MRN: [id number]  Chief Complaint  Patient presents with  . Follow-up    Recheck on right TKA, DOS 12-29-17.    Encounter Diagnosis  Name Primary?  . S/P TKR (total knee replacement), right 12/29/17 Yes   The patient is doing well with her right total knee.  She has excellent flexion extension her wound looks clean no signs of infection or DVT she is 6 weeks out.  She asked for another prescription which we will do and will start a opioid taper.

## 2018-02-13 ENCOUNTER — Encounter (HOSPITAL_COMMUNITY): Payer: PPO | Admitting: Physical Therapy

## 2018-02-15 ENCOUNTER — Encounter (HOSPITAL_COMMUNITY): Payer: PPO | Admitting: Physical Therapy

## 2018-02-17 ENCOUNTER — Ambulatory Visit (HOSPITAL_COMMUNITY): Payer: PPO | Admitting: Physical Therapy

## 2018-02-23 DIAGNOSIS — G4733 Obstructive sleep apnea (adult) (pediatric): Secondary | ICD-10-CM | POA: Diagnosis not present

## 2018-02-24 DIAGNOSIS — G4733 Obstructive sleep apnea (adult) (pediatric): Secondary | ICD-10-CM | POA: Diagnosis not present

## 2018-03-09 ENCOUNTER — Other Ambulatory Visit: Payer: Self-pay | Admitting: Orthopedic Surgery

## 2018-03-09 MED ORDER — HYDROCODONE-ACETAMINOPHEN 5-325 MG PO TABS: 1.0000 | ORAL_TABLET | Freq: Four times a day (QID) | ORAL | 0 refills | Status: DC | PRN

## 2018-03-09 NOTE — Telephone Encounter (Signed)
Patient called for refill:  HYDROcodone-acetaminophen (NORCO) 7.5-325 MG tablet 21 tablet  - [place]

## 2018-03-09 NOTE — Telephone Encounter (Signed)
Filled reduced dose

## 2018-03-22 ENCOUNTER — Ambulatory Visit (INDEPENDENT_AMBULATORY_CARE_PROVIDER_SITE_OTHER): Payer: Self-pay | Admitting: Orthopedic Surgery

## 2018-03-22 VITALS — BP 153/90 | HR 77 | Ht 62.0 in | Wt 176.0 lb

## 2018-03-22 DIAGNOSIS — Z96651 Presence of right artificial knee joint: Secondary | ICD-10-CM

## 2018-03-22 NOTE — Progress Notes (Signed)
Post op   Chief Complaint  Patient presents with  . Follow-up    Recheck on right TKA, DOS 12-29-17.    Encounter Diagnosis  Name Primary?  . S/P TKR (total knee replacement), right 12/29/17 Yes   Global period:   Follow-up right knee replacement  Emily [name] continues to do well.  She is walking with no support she does not have a limp we will see her in January for x-rays on a yearly follow-up basis any problems before that she is to call the office for an appointment

## 2018-03-23 ENCOUNTER — Telehealth: Payer: Self-pay | Admitting: Family Medicine

## 2018-03-23 DIAGNOSIS — Z79899 Other long term (current) drug therapy: Secondary | ICD-10-CM

## 2018-03-23 DIAGNOSIS — E781 Pure hyperglyceridemia: Secondary | ICD-10-CM

## 2018-03-23 DIAGNOSIS — D649 Anemia, unspecified: Secondary | ICD-10-CM

## 2018-03-23 NOTE — Telephone Encounter (Signed)
Patient has an appointment on 03/30/18 with Dr. Nicki Reaper.  She is requesting orders for labs.

## 2018-03-23 NOTE — Telephone Encounter (Signed)
Blood work ordered in Epic. Patient notified. 

## 2018-03-23 NOTE — Telephone Encounter (Signed)
12/2017- CBC, Met 7 and Liver 09/2017- HGBA1C 07/2017- Lipid, Liver, Met 7, Hep C and HIV

## 2018-03-23 NOTE — Telephone Encounter (Signed)
Lipid, liver, metabolic 7, CBC-anemia, hypocalcemia, hyperlipidemia

## 2018-03-26 DIAGNOSIS — G4733 Obstructive sleep apnea (adult) (pediatric): Secondary | ICD-10-CM | POA: Diagnosis not present

## 2018-03-27 DIAGNOSIS — Z79899 Other long term (current) drug therapy: Secondary | ICD-10-CM | POA: Diagnosis not present

## 2018-03-27 DIAGNOSIS — D649 Anemia, unspecified: Secondary | ICD-10-CM | POA: Diagnosis not present

## 2018-03-27 DIAGNOSIS — E781 Pure hyperglyceridemia: Secondary | ICD-10-CM | POA: Diagnosis not present

## 2018-03-28 ENCOUNTER — Ambulatory Visit: Payer: PPO | Admitting: Family Medicine

## 2018-03-28 LAB — CBC WITH DIFFERENTIAL/PLATELET
BASOS ABS: 0 10*3/uL (ref 0.0–0.2)
Basos: 0 %
EOS (ABSOLUTE): 0.1 10*3/uL (ref 0.0–0.4)
Eos: 2 %
HEMOGLOBIN: 12.5 g/dL (ref 11.1–15.9)
Hematocrit: 37.3 % (ref 34.0–46.6)
Immature Grans (Abs): 0 10*3/uL (ref 0.0–0.1)
Immature Granulocytes: 0 %
LYMPHS ABS: 1.4 10*3/uL (ref 0.7–3.1)
Lymphs: 26 %
MCH: 30.3 pg (ref 26.6–33.0)
MCHC: 33.5 g/dL (ref 31.5–35.7)
MCV: 90 fL (ref 79–97)
MONOCYTES: 9 %
MONOS ABS: 0.5 10*3/uL (ref 0.1–0.9)
Neutrophils Absolute: 3.4 10*3/uL (ref 1.4–7.0)
Neutrophils: 63 %
PLATELETS: 218 10*3/uL (ref 150–379)
RBC: 4.13 x10E6/uL (ref 3.77–5.28)
RDW: 13.9 % (ref 12.3–15.4)
WBC: 5.4 10*3/uL (ref 3.4–10.8)

## 2018-03-28 LAB — LIPID PANEL
CHOL/HDL RATIO: 3.3 ratio (ref 0.0–4.4)
Cholesterol, Total: 137 mg/dL (ref 100–199)
HDL: 42 mg/dL (ref >39)
LDL Calculated: 61 mg/dL (ref 0–99)
TRIGLYCERIDES: 170 mg/dL — AB (ref 0–149)
VLDL Cholesterol Cal: 34 mg/dL (ref 5–40)

## 2018-03-28 LAB — BASIC METABOLIC PANEL
BUN / CREAT RATIO: 15 (ref 12–28)
BUN: 11 mg/dL (ref 8–27)
CHLORIDE: 101 mmol/L (ref 96–106)
CO2: 26 mmol/L (ref 20–29)
Calcium: 9.2 mg/dL (ref 8.7–10.3)
Creatinine, Ser: 0.72 mg/dL (ref 0.57–1.00)
GFR calc Af Amer: 102 mL/min/{1.73_m2} (ref >59)
GFR calc non Af Amer: 88 mL/min/{1.73_m2} (ref >59)
GLUCOSE: 91 mg/dL (ref 65–99)
POTASSIUM: 4 mmol/L (ref 3.5–5.2)
SODIUM: 142 mmol/L (ref 134–144)

## 2018-03-28 LAB — HEPATIC FUNCTION PANEL
ALT: 14 IU/L (ref 0–32)
AST: 20 IU/L (ref 0–40)
Albumin: 4 g/dL (ref 3.6–4.8)
Alkaline Phosphatase: 80 IU/L (ref 39–117)
BILIRUBIN, DIRECT: 0.09 mg/dL (ref 0.00–0.40)
Bilirubin Total: 0.3 mg/dL (ref 0.0–1.2)
Total Protein: 6.8 g/dL (ref 6.0–8.5)

## 2018-03-30 ENCOUNTER — Ambulatory Visit: Payer: PPO | Admitting: Family Medicine

## 2018-03-31 DIAGNOSIS — G4733 Obstructive sleep apnea (adult) (pediatric): Secondary | ICD-10-CM | POA: Diagnosis not present

## 2018-04-10 ENCOUNTER — Ambulatory Visit (INDEPENDENT_AMBULATORY_CARE_PROVIDER_SITE_OTHER): Payer: PPO | Admitting: Family Medicine

## 2018-04-10 ENCOUNTER — Encounter: Payer: Self-pay | Admitting: Family Medicine

## 2018-04-10 VITALS — BP 130/78 | Ht 62.0 in | Wt 178.8 lb

## 2018-04-10 DIAGNOSIS — E781 Pure hyperglyceridemia: Secondary | ICD-10-CM

## 2018-04-10 DIAGNOSIS — K219 Gastro-esophageal reflux disease without esophagitis: Secondary | ICD-10-CM

## 2018-04-10 DIAGNOSIS — Z1231 Encounter for screening mammogram for malignant neoplasm of breast: Secondary | ICD-10-CM | POA: Diagnosis not present

## 2018-04-10 DIAGNOSIS — Z780 Asymptomatic menopausal state: Secondary | ICD-10-CM

## 2018-04-10 DIAGNOSIS — I10 Essential (primary) hypertension: Secondary | ICD-10-CM

## 2018-04-10 DIAGNOSIS — F320 Major depressive disorder, single episode, mild: Secondary | ICD-10-CM

## 2018-04-10 MED ORDER — DULOXETINE HCL 60 MG PO CPEP: 60.0000 mg | ORAL_CAPSULE | Freq: Every day | ORAL | 5 refills | Status: DC

## 2018-04-10 MED ORDER — HYDROCHLOROTHIAZIDE 25 MG PO TABS: 25.0000 mg | ORAL_TABLET | Freq: Every day | ORAL | 5 refills | Status: DC

## 2018-04-10 MED ORDER — PRAVASTATIN SODIUM 20 MG PO TABS: 20.0000 mg | ORAL_TABLET | Freq: Every day | ORAL | 5 refills | Status: DC

## 2018-04-10 MED ORDER — VERAPAMIL HCL ER 180 MG PO TBCR: 180.0000 mg | EXTENDED_RELEASE_TABLET | Freq: Every day | ORAL | 5 refills | Status: DC

## 2018-04-10 MED ORDER — POTASSIUM CHLORIDE CRYS ER 20 MEQ PO TBCR: 20.0000 meq | EXTENDED_RELEASE_TABLET | Freq: Every day | ORAL | 5 refills | Status: DC

## 2018-04-10 MED ORDER — MOMETASONE FUROATE 0.1 % EX CREA: TOPICAL_CREAM | CUTANEOUS | 1 refills | Status: DC

## 2018-04-10 MED ORDER — PANTOPRAZOLE SODIUM 40 MG PO TBEC: 40.0000 mg | DELAYED_RELEASE_TABLET | Freq: Every day | ORAL | 8 refills | Status: DC
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
## 2018-04-14 ENCOUNTER — Telehealth: Payer: Self-pay | Admitting: Family Medicine

## 2018-04-14 ENCOUNTER — Other Ambulatory Visit: Payer: Self-pay | Admitting: Family Medicine

## 2018-04-14 MED ORDER — PREDNISONE 20 MG PO TABS: ORAL_TABLET | ORAL | 0 refills | Status: DC

## 2018-04-14 NOTE — Telephone Encounter (Signed)
Here on 5-6 and Dr. Nicki Reaper told her to call back if her poison oak didn't get better on it's on.  She said it's worse and is on her arms and legs, nothing on face.  Wants prescription called in for prednisone.  [place]

## 2018-04-14 NOTE — Telephone Encounter (Signed)
Prednisone, 20 mg, #18,3qd for 3d then 2qd for 3d then 1qd for 3d-follow-up if ongoing \

## 2018-04-14 NOTE — Telephone Encounter (Signed)
Med sent in; tried to call but no answering service set up;phone kept ringing

## 2018-04-17 NOTE — Telephone Encounter (Signed)
Left message to return call 

## 2018-04-19 ENCOUNTER — Encounter (HOSPITAL_COMMUNITY): Payer: Self-pay

## 2018-04-19 ENCOUNTER — Ambulatory Visit (HOSPITAL_COMMUNITY)
Admission: RE | Admit: 2018-04-19 | Discharge: 2018-04-19 | Disposition: A | Discharge status: Home / Self Care | Payer: PPO | Source: Ambulatory Visit | Attending: Family Medicine | Admitting: Family Medicine

## 2018-04-19 DIAGNOSIS — Z13820 Encounter for screening for osteoporosis: Secondary | ICD-10-CM | POA: Diagnosis not present

## 2018-04-19 DIAGNOSIS — Z1231 Encounter for screening mammogram for malignant neoplasm of breast: Secondary | ICD-10-CM | POA: Insufficient documentation

## 2018-04-19 DIAGNOSIS — Z780 Asymptomatic menopausal state: Secondary | ICD-10-CM | POA: Diagnosis not present

## 2018-04-19 NOTE — Telephone Encounter (Signed)
Per pharmacy patient did pick up her prescription as prescribed

## 2018-04-25 DIAGNOSIS — G4733 Obstructive sleep apnea (adult) (pediatric): Secondary | ICD-10-CM | POA: Diagnosis not present

## 2018-05-26 DIAGNOSIS — G4733 Obstructive sleep apnea (adult) (pediatric): Secondary | ICD-10-CM | POA: Diagnosis not present

## 2018-06-25 DIAGNOSIS — G4733 Obstructive sleep apnea (adult) (pediatric): Secondary | ICD-10-CM | POA: Diagnosis not present

## 2018-07-26 DIAGNOSIS — G4733 Obstructive sleep apnea (adult) (pediatric): Secondary | ICD-10-CM | POA: Diagnosis not present

## 2018-08-23 DIAGNOSIS — H524 Presbyopia: Secondary | ICD-10-CM | POA: Diagnosis not present

## 2018-08-23 DIAGNOSIS — H25813 Combined forms of age-related cataract, bilateral: Secondary | ICD-10-CM | POA: Diagnosis not present

## 2018-08-23 DIAGNOSIS — H16223 Keratoconjunctivitis sicca, not specified as Sjogren's, bilateral: Secondary | ICD-10-CM | POA: Diagnosis not present

## 2018-08-23 DIAGNOSIS — H5213 Myopia, bilateral: Secondary | ICD-10-CM | POA: Diagnosis not present

## 2018-08-26 DIAGNOSIS — G4733 Obstructive sleep apnea (adult) (pediatric): Secondary | ICD-10-CM | POA: Diagnosis not present

## 2018-09-14 DIAGNOSIS — H353132 Nonexudative age-related macular degeneration, bilateral, intermediate dry stage: Secondary | ICD-10-CM | POA: Diagnosis not present

## 2018-09-14 DIAGNOSIS — H2513 Age-related nuclear cataract, bilateral: Secondary | ICD-10-CM | POA: Diagnosis not present

## 2018-09-14 DIAGNOSIS — H2511 Age-related nuclear cataract, right eye: Secondary | ICD-10-CM | POA: Diagnosis not present

## 2018-09-14 DIAGNOSIS — H25013 Cortical age-related cataract, bilateral: Secondary | ICD-10-CM | POA: Diagnosis not present

## 2018-09-14 DIAGNOSIS — H02839 Dermatochalasis of unspecified eye, unspecified eyelid: Secondary | ICD-10-CM | POA: Diagnosis not present

## 2018-09-25 DIAGNOSIS — G4733 Obstructive sleep apnea (adult) (pediatric): Secondary | ICD-10-CM | POA: Diagnosis not present

## 2018-09-26 DIAGNOSIS — H2511 Age-related nuclear cataract, right eye: Secondary | ICD-10-CM | POA: Diagnosis not present

## 2018-09-26 DIAGNOSIS — H25811 Combined forms of age-related cataract, right eye: Secondary | ICD-10-CM | POA: Diagnosis not present

## 2018-10-09 ENCOUNTER — Telehealth: Payer: Self-pay

## 2018-10-09 DIAGNOSIS — Z13220 Encounter for screening for lipoid disorders: Secondary | ICD-10-CM

## 2018-10-09 DIAGNOSIS — Z79899 Other long term (current) drug therapy: Secondary | ICD-10-CM

## 2018-10-09 DIAGNOSIS — I10 Essential (primary) hypertension: Secondary | ICD-10-CM

## 2018-10-09 NOTE — Addendum Note (Signed)
Addended by: Karle Barr on: 10/09/2018 09:57 AM   Modules accepted: Orders

## 2018-10-09 NOTE — Telephone Encounter (Signed)
Patient showed up at [facility] wanting labs drawn. She did not have any labs ordered. Her last labs were drawn 03/27/2018 Bmet,cbc,Hepatic,lipid. I advised the lab to let the pt know I am sorry no labs have been ordered,Sorry she went there,but advised that I would ask the Dr. Johnney Ou he would like drawn and I would call her with the answer.

## 2018-10-09 NOTE — Telephone Encounter (Signed)
Lipid, liver, metabolic 7 

## 2018-10-09 NOTE — Telephone Encounter (Signed)
Patient is aware the orders have been placed

## 2018-10-10 DIAGNOSIS — Z79899 Other long term (current) drug therapy: Secondary | ICD-10-CM | POA: Diagnosis not present

## 2018-10-10 DIAGNOSIS — I10 Essential (primary) hypertension: Secondary | ICD-10-CM | POA: Diagnosis not present

## 2018-10-10 DIAGNOSIS — Z13220 Encounter for screening for lipoid disorders: Secondary | ICD-10-CM | POA: Diagnosis not present

## 2018-10-11 ENCOUNTER — Ambulatory Visit (INDEPENDENT_AMBULATORY_CARE_PROVIDER_SITE_OTHER): Payer: PPO | Admitting: Family Medicine

## 2018-10-11 ENCOUNTER — Encounter: Payer: Self-pay | Admitting: Family Medicine

## 2018-10-11 VITALS — BP 138/90 | Ht 62.0 in | Wt 182.0 lb

## 2018-10-11 DIAGNOSIS — M545 Low back pain, unspecified: Secondary | ICD-10-CM

## 2018-10-11 DIAGNOSIS — K219 Gastro-esophageal reflux disease without esophagitis: Secondary | ICD-10-CM | POA: Diagnosis not present

## 2018-10-11 DIAGNOSIS — I10 Essential (primary) hypertension: Secondary | ICD-10-CM

## 2018-10-11 DIAGNOSIS — Z23 Encounter for immunization: Secondary | ICD-10-CM | POA: Diagnosis not present

## 2018-10-11 DIAGNOSIS — G8929 Other chronic pain: Secondary | ICD-10-CM | POA: Diagnosis not present

## 2018-10-11 DIAGNOSIS — E781 Pure hyperglyceridemia: Secondary | ICD-10-CM

## 2018-10-11 LAB — BASIC METABOLIC PANEL
BUN / CREAT RATIO: 16 (ref 12–28)
BUN: 11 mg/dL (ref 8–27)
CHLORIDE: 101 mmol/L (ref 96–106)
CO2: 26 mmol/L (ref 20–29)
Calcium: 9.4 mg/dL (ref 8.7–10.3)
Creatinine, Ser: 0.7 mg/dL (ref 0.57–1.00)
GFR calc Af Amer: 104 mL/min/{1.73_m2} (ref >59)
GFR calc non Af Amer: 91 mL/min/{1.73_m2} (ref >59)
GLUCOSE: 102 mg/dL — AB (ref 65–99)
Potassium: 4.1 mmol/L (ref 3.5–5.2)
SODIUM: 141 mmol/L (ref 134–144)

## 2018-10-11 LAB — HEPATIC FUNCTION PANEL
ALT: 18 IU/L (ref 0–32)
AST: 19 IU/L (ref 0–40)
Albumin: 4.2 g/dL (ref 3.6–4.8)
Alkaline Phosphatase: 75 IU/L (ref 39–117)
BILIRUBIN, DIRECT: 0.12 mg/dL (ref 0.00–0.40)
Bilirubin Total: 0.4 mg/dL (ref 0.0–1.2)
Total Protein: 6.7 g/dL (ref 6.0–8.5)

## 2018-10-11 LAB — LIPID PANEL
CHOLESTEROL TOTAL: 147 mg/dL (ref 100–199)
Chol/HDL Ratio: 3.8 ratio (ref 0.0–4.4)
HDL: 39 mg/dL — ABNORMAL LOW (ref >39)
LDL Calculated: 56 mg/dL (ref 0–99)
Triglycerides: 258 mg/dL — ABNORMAL HIGH (ref 0–149)
VLDL CHOLESTEROL CAL: 52 mg/dL — AB (ref 5–40)

## 2018-10-11 MED ORDER — DULOXETINE HCL 60 MG PO CPEP: 60.0000 mg | ORAL_CAPSULE | Freq: Every day | ORAL | 5 refills | Status: DC

## 2018-10-11 MED ORDER — PANTOPRAZOLE SODIUM 40 MG PO TBEC: 40.0000 mg | DELAYED_RELEASE_TABLET | Freq: Every day | ORAL | 8 refills | Status: DC

## 2018-10-11 MED ORDER — PRAVASTATIN SODIUM 20 MG PO TABS: 20.0000 mg | ORAL_TABLET | Freq: Every day | ORAL | 5 refills | Status: DC

## 2018-10-11 MED ORDER — ZOSTER VAC RECOMB ADJUVANTED 50 MCG/0.5ML IM SUSR: 0.5000 mL | Freq: Once | INTRAMUSCULAR | 1 refills | Status: AC

## 2018-10-11 MED ORDER — ZOSTER VAC RECOMB ADJUVANTED 50 MCG/0.5ML IM SUSR: 0.5000 mL | Freq: Once | INTRAMUSCULAR | 1 refills | Status: DC

## 2018-10-11 MED ORDER — POTASSIUM CHLORIDE CRYS ER 20 MEQ PO TBCR: 20.0000 meq | EXTENDED_RELEASE_TABLET | Freq: Every day | ORAL | 5 refills | Status: DC

## 2018-10-11 MED ORDER — VERAPAMIL HCL ER 180 MG PO TBCR: 180.0000 mg | EXTENDED_RELEASE_TABLET | Freq: Every day | ORAL | 5 refills | Status: DC

## 2018-10-11 MED ORDER — HYDROCODONE-ACETAMINOPHEN 5-325 MG PO TABS: ORAL_TABLET | ORAL | 0 refills | Status: DC

## 2018-10-11 MED ORDER — HYDROCHLOROTHIAZIDE 25 MG PO TABS: 25.0000 mg | ORAL_TABLET | Freq: Every day | ORAL | 5 refills | Status: DC

## 2018-10-11 NOTE — Progress Notes (Signed)
Subjective:    Patient ID: Emily [name], female    DOB: [birth year], 67 y.o.   MRN: [id number]  HPI  Patient is here today to follow up on her chronic health issues.She is states she is on xanax prn and cymbalta 60 mg one per day. She says her medications are helping,but the cymbalta was for her fibromyalgia. She eats healthy and gets some exercise. She does see an eye Dr. Also sees Dr. Aline Brochure for her knee.PHQ 9 and Gad 7 filled out. She had her labs drawn yesterday.  Patient has fibromyalgia tolerates it is best she can  Patient here for follow-up regarding cholesterol.  The patient does have hyperlipidemia.  Patient does try to maintain a reasonable diet.  Patient does take the medication on a regular basis.  Denies missing a dose.  The patient denies any obvious side effects.  Prior blood work results reviewed with the patient.  The patient is aware of his cholesterol goals and the need to keep it under good control to lessen the risk of disease.  Patient does have ongoing trouble with reflux.  Takes medication on a regular basis.  Tries to minimize foods as best they can.  They understand the importance of dietary compliance.  May also try to avoid eating a large meal close to bedtime.  Patient denies any dysphagia denies hematochezia.  States medicine does a good job keeping the problem under good control.  Without the medication may certainly have issues.They desire to continue taking their medication.  Patient for blood pressure check up.  The patient does have hypertension.  The patient is on medication.  Patient relates compliance with meds. Todays BP reviewed with the patient. Patient denies issues with medication. Patient relates reasonable diet. Patient tries to minimize salt. Patient aware of BP goals.  Chronic back pain uses hydrocodone sparingly requesting a prescription for a small amount Review of Systems  Constitutional: Negative for activity change, appetite change and  fatigue.  HENT: Negative for congestion and rhinorrhea.   Eyes: Negative for discharge.  Respiratory: Negative for cough, chest tightness and wheezing.   Cardiovascular: Negative for chest pain.  Gastrointestinal: Negative for abdominal pain, blood in stool and vomiting.  Endocrine: Negative for polyphagia.  Genitourinary: Negative for difficulty urinating and frequency.  Musculoskeletal: Negative for neck pain.  Skin: Negative for color change.  Allergic/Immunologic: Negative for environmental allergies and food allergies.  Neurological: Negative for weakness and headaches.  Psychiatric/Behavioral: Negative for agitation and behavioral problems.       Objective:   Physical Exam  Constitutional: She is oriented to person, place, and time. She appears well-developed and well-nourished.  HENT:  Head: Normocephalic.  Right Ear: External ear normal.  Left Ear: External ear normal.  Eyes: Pupils are equal, round, and reactive to light.  Neck: Normal range of motion. No thyromegaly present.  Cardiovascular: Normal rate, regular rhythm, normal heart sounds and intact distal pulses.  No murmur heard. Pulmonary/Chest: Effort normal and breath sounds normal. No respiratory distress. She has no wheezes.  Abdominal: Soft. Bowel sounds are normal. She exhibits no distension and no mass. There is no tenderness.  Musculoskeletal: Normal range of motion. She exhibits no edema or tenderness.  Lymphadenopathy:    She has no cervical adenopathy.  Neurological: She is alert and oriented to person, place, and time. She exhibits normal muscle tone.  Skin: Skin is warm and dry.  Psychiatric: She has a normal mood and affect. Her behavior is normal.  Assessment & Plan:  Hydrocodone prescribed for chronic neck pain uses sparingly keeps in a safe place  The patient was seen today as part of an evaluation regarding hyperlipidemia.  Recent lab work has been reviewed with the patient as well  as the goals for good cholesterol care.  In addition to this medications have been discussed the importance of compliance with diet and medications discussed as well.  Finally the patient is aware that poor control of cholesterol, noncompliance can dramatically increase the risk of complications. The patient will keep regular office visits and the patient does agreed to periodic lab work.  HTN- Patient was seen today as part of a visit regarding hypertension. The importance of healthy diet and regular physical activity was discussed. The importance of compliance with medications discussed.  Ideal goal is to keep blood pressure low elevated levels certainly below 158/30 when possible.  The patient was counseled that keeping blood pressure under control lessen his risk of complications.  The importance of regular follow-ups was discussed with the patient.  Low-salt diet such as DASH recommended.  Regular physical activity was recommended as well.  Patient was advised to keep regular follow-ups.  Fibromyalgia under good control with current medication  The patient was seen today for GERD. Patient benefits from medication. Patient to continue medication. Keep all regular follow ups.  Labs were reviewed with the patient  25 minutes was spent with the patient.  This statement verifies that 25 minutes was indeed spent with the patient.  More than 50% of this visit-total duration of the visit-was spent in counseling and coordination of care. The issues that the patient came in for today as reflected in the diagnosis (s) please refer to documentation for further details.

## 2018-10-11 NOTE — Patient Instructions (Signed)

## 2018-10-23 DIAGNOSIS — H25012 Cortical age-related cataract, left eye: Secondary | ICD-10-CM | POA: Diagnosis not present

## 2018-10-23 DIAGNOSIS — H2512 Age-related nuclear cataract, left eye: Secondary | ICD-10-CM | POA: Diagnosis not present

## 2018-10-26 DIAGNOSIS — G4733 Obstructive sleep apnea (adult) (pediatric): Secondary | ICD-10-CM | POA: Diagnosis not present

## 2018-10-31 DIAGNOSIS — H25812 Combined forms of age-related cataract, left eye: Secondary | ICD-10-CM | POA: Diagnosis not present

## 2018-10-31 DIAGNOSIS — H2512 Age-related nuclear cataract, left eye: Secondary | ICD-10-CM | POA: Diagnosis not present

## 2018-11-25 DIAGNOSIS — G4733 Obstructive sleep apnea (adult) (pediatric): Secondary | ICD-10-CM | POA: Diagnosis not present

## 2018-12-06 DIAGNOSIS — G4733 Obstructive sleep apnea (adult) (pediatric): Secondary | ICD-10-CM | POA: Diagnosis not present

## 2018-12-20 ENCOUNTER — Ambulatory Visit: Payer: PPO | Admitting: Orthopedic Surgery

## 2018-12-26 ENCOUNTER — Other Ambulatory Visit: Payer: Self-pay

## 2018-12-26 ENCOUNTER — Encounter (HOSPITAL_COMMUNITY): Payer: Self-pay | Admitting: Emergency Medicine

## 2018-12-26 ENCOUNTER — Emergency Department (HOSPITAL_COMMUNITY): Payer: PPO

## 2018-12-26 ENCOUNTER — Emergency Department (HOSPITAL_COMMUNITY)
Admission: EM | Admit: 2018-12-26 | Discharge: 2018-12-26 | Disposition: A | Discharge status: Home / Self Care | Payer: PPO | Attending: Emergency Medicine | Admitting: Emergency Medicine

## 2018-12-26 DIAGNOSIS — I509 Heart failure, unspecified: Secondary | ICD-10-CM | POA: Diagnosis not present

## 2018-12-26 DIAGNOSIS — Y9289 Other specified places as the place of occurrence of the external cause: Secondary | ICD-10-CM | POA: Diagnosis not present

## 2018-12-26 DIAGNOSIS — Y9389 Activity, other specified: Secondary | ICD-10-CM | POA: Diagnosis not present

## 2018-12-26 DIAGNOSIS — I110 Hypertensive heart disease with heart failure: Secondary | ICD-10-CM | POA: Insufficient documentation

## 2018-12-26 DIAGNOSIS — Z79899 Other long term (current) drug therapy: Secondary | ICD-10-CM | POA: Insufficient documentation

## 2018-12-26 DIAGNOSIS — W208XXA Other cause of strike by thrown, projected or falling object, initial encounter: Secondary | ICD-10-CM | POA: Insufficient documentation

## 2018-12-26 DIAGNOSIS — S0101XA Laceration without foreign body of scalp, initial encounter: Secondary | ICD-10-CM | POA: Insufficient documentation

## 2018-12-26 DIAGNOSIS — G4733 Obstructive sleep apnea (adult) (pediatric): Secondary | ICD-10-CM | POA: Diagnosis not present

## 2018-12-26 DIAGNOSIS — Y998 Other external cause status: Secondary | ICD-10-CM | POA: Diagnosis not present

## 2018-12-26 DIAGNOSIS — S0990XA Unspecified injury of head, initial encounter: Secondary | ICD-10-CM | POA: Diagnosis not present

## 2018-12-26 MED ORDER — MELOXICAM 7.5 MG PO TABS: 15.0000 mg | ORAL_TABLET | Freq: Two times a day (BID) | ORAL | 0 refills | Status: AC

## 2018-12-26 MED ORDER — DIPHENHYDRAMINE HCL 25 MG PO CAPS
25.0000 mg | ORAL_CAPSULE | Freq: Once | ORAL | Status: AC
  Administered 2018-12-26: 25 mg via ORAL
  Filled 2018-12-26: qty 1

## 2018-12-26 MED ORDER — LIDOCAINE HCL (PF) 1 % IJ SOLN
INTRAMUSCULAR | Status: AC
  Filled 2018-12-26: qty 2

## 2018-12-26 MED ORDER — HYDROCODONE-ACETAMINOPHEN 5-325 MG PO TABS
2.0000 | ORAL_TABLET | Freq: Once | ORAL | Status: AC
  Administered 2018-12-26: 2 via ORAL
  Filled 2018-12-26: qty 2

## 2018-12-26 MED ORDER — BACITRACIN ZINC 500 UNIT/GM EX OINT
1.0000 "application " | TOPICAL_OINTMENT | Freq: Two times a day (BID) | CUTANEOUS | Status: DC
  Administered 2018-12-26: 1 via TOPICAL
  Filled 2018-12-26: qty 0.9

## 2018-12-26 MED ORDER — POVIDONE-IODINE 10 % EX SOLN
CUTANEOUS | Status: AC
  Administered 2018-12-26: 17:00:00
  Filled 2018-12-26: qty 15
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
## 2018-12-26 NOTE — ED Notes (Signed)
Pt to CT at this time.

## 2018-12-26 NOTE — ED Provider Notes (Signed)
[facility] EMERGENCY DEPARTMENT Provider Note   CSN: [id number] Arrival date & time: 12/26/18  1646     History   Chief Complaint Chief Complaint  Patient presents with  . Head Laceration    HPI Emily [name] is a 68 y.o. female.  HPI  The patient is a 68 year old female presenting with a complaint of a head injury that occurred just prior to arrival when she was standing under the ladder that her husband was on, he was trimming the trees when he accidentally dropped the branch loppers which struck her on the head, metallic point to the scalp just left of midline on the crown of the head.  She did not fall to the ground, did not lose consciousness and has no nausea vomiting neck pain numbness weakness dizziness or changes in vision.  She did have some significant bleeding, she is not on any anticoagulants.  That has since stopped but she has a severe headache.  She was brought immediately here by her husband.  Past Medical History:  Diagnosis Date  . Acid reflux   . Anxiety   . Arthritis   . CHF (congestive heart failure) (HCC)    90's swelling body-no breathing problems at that time-no cardiac -in dr s Wolfgang [place] office [place] give lasix and potassium  . Complication of anesthesia    History of being told she was allergic to something in 1980 no problems since  . Depression   . Fibromyalgia   . History of blood transfusion reaction 1978  . History of hiatal hernia   . HNP (herniated nucleus pulposus)   . Hypertension    Does not see a cardiologist no stress or echo  . Joint pain   . Nerve pain    right leg   . Ruptured lumbar disc   . Sleep apnea    Uses CPAP every night setting at 7 mm/Hg    Patient Active Problem List   Diagnosis Date Noted  . S/P TKR (total knee replacement), right 12/29/17 01/10/2018  . Primary osteoarthritis of right knee   . GERD without esophagitis 03/28/2017  . Obesity (BMI 30-39.9) 03/28/2017  . Obesity 10/06/2016  . Lumbar foraminal  stenosis 06/04/2016  . Carpal tunnel syndrome, bilateral 02/07/2014  . S/P lumbar spinal fusion 02/07/2014  . Obstructive sleep apnea 02/04/2014  . Hand arthritis 10/15/2013  . HTN (hypertension), benign 08/24/2013  . Hypertriglyceridemia 08/24/2013  . Iliotibial band tendonitis 02/08/2013  . Trigger finger, acquired 02/08/2013  . Low back pain 06/22/2011  . HNP (herniated nucleus pulposus) 06/22/2011  . HIP PAIN, RIGHT 12/15/2010  . LUMBOSACRAL SPONDYLOSIS WITHOUT MYELOPATHY 12/15/2010  . SPINAL STENOSIS, LUMBAR 12/15/2010  . KNEE REPLACEMENT, HX OF 02/26/2010  . DERANGEMENT MENISCUS 07/03/2009  . KNEE, ARTHRITIS, DEGEN./OSTEO 05/22/2009  . KNEE PAIN 05/22/2009  . ANSERINE BURSITIS 05/22/2009  . TRIGGER FINGER 09/23/2008    Past Surgical History:  Procedure Laterality Date  . BACK SURGERY     x3  . BLADDER REPAIR     tear during during birth of child  . CARPAL TUNNEL RELEASE Bilateral   . CESAREAN SECTION    . CHOLECYSTECTOMY     gallbladder disease; stayed sick  . COLONOSCOPY N/A 04/28/2016   Procedure: COLONOSCOPY;  Surgeon: [name], MD;  Location: [facility] END[facility] SUITE;  Service: Endoscopy;  Laterality: N/A;  . ESOPHAGOGASTRODUODENOSCOPY N/A 04/28/2016   Procedure: ESOPHAGOGASTRODUODENOSCOPY (EGD);  Surgeon: [name], MD;  Location: [facility] ENDO SUITE;  Service: Endoscopy;  Laterality: N/A;  12:00  . GALLBLADDER SURGERY    . knee right (torn Cart)    . REPLACEMENT TOTAL KNEE     left  . ROTATOR CUFF REPAIR Bilateral    right and left  . SPINAL FUSION    . TOTAL KNEE ARTHROPLASTY Right 12/29/2017   Procedure: TOTAL KNEE ARTHROPLASTY-right knee;  Surgeon: [name], MD;  Location: [facility] ORS;  Service: Orthopedics;  Laterality: Right;  . TUBAL LIGATION    . vein right leg removed    . wrist ganglion cyst left       OB History   No obstetric history on file.      Home Medications    Prior to Admission medications   Medication Sig Start Date End Date  Taking? Authorizing Provider  acetaminophen (TYLENOL) 500 MG tablet Take 500 mg by mouth every 6 (six) hours as needed. She takes two Q 8 hours prn    [provider]  diazepam (VALIUM) 5 MG tablet Take 5 mg by mouth every 6 (six) hours as needed for muscle spasms.     [provider]  diphenhydrAMINE (BENADRYL ALLERGY) 25 MG tablet Take 1 tablet (25 mg total) by mouth every 4 (four) hours. Take with hydrocodone 12/31/17   [name], MD  docusate sodium (COLACE) 100 MG capsule Take 1 capsule (100 mg total) by mouth 2 (two) times daily. 12/31/17   [name], MD  DULoxetine (CYMBALTA) 60 MG capsule Take 1 capsule (60 mg total) by mouth daily. 10/11/18   Kathyrn Drown, MD  fexofenadine (ALLEGRA) 180 MG tablet Take 180 mg by mouth daily as needed for allergies or rhinitis.    [provider]  hydrochlorothiazide (HYDRODIURIL) 25 MG tablet Take 1 tablet (25 mg total) by mouth daily. 10/11/18   Kathyrn Drown, MD  HYDROcodone-acetaminophen (NORCO/VICODIN) 5-325 MG tablet 1 q4 hours prn chronic back pain 10/11/18   Kathyrn Drown, MD  meloxicam (MOBIC) 7.5 MG tablet Take 2 tablets (15 mg total) by mouth 2 (two) times daily for 7 days. 12/26/18 01/02/19  Noemi Chapel, MD  Misc. Devices (TOILET SEAT ELEVATOR) MISC Elevated toilet seat if needed 12/31/17   [name], MD  mometasone (ELOCON) 0.1 % cream Apply to affected area bid prn 04/10/18 04/10/19  Kathyrn Drown, MD  Multiple Vitamins-Minerals (PRESERVISION AREDS 2 PO) Take by mouth. Two po daily.    [provider]  pantoprazole (PROTONIX) 40 MG tablet Take 1 tablet (40 mg total) by mouth daily. 10/11/18   Kathyrn Drown, MD  potassium chloride SA (KLOR-CON M20) 20 MEQ tablet Take 1 tablet (20 mEq total) by mouth daily. 10/11/18   Kathyrn Drown, MD  pravastatin (PRAVACHOL) 20 MG tablet Take 1 tablet (20 mg total) by mouth daily. 10/11/18   Kathyrn Drown, MD  verapamil (CALAN-SR) 180 MG CR  tablet Take 1 tablet (180 mg total) by mouth daily. 10/11/18   Kathyrn Drown, MD    Family History Family History  Problem Relation Age of Onset  . Hypertension Father   . Cancer Father        liposarcoma  . Heart disease Mother   . Cancer Other        family history   . Diabetes Other        family history   . Coronary artery disease Other        family history   . Arthritis Other  family history   . Asthma Other   . Diabetes Brother   . Heart disease Brother   . Coronary artery disease Brother 33       CABD    Social History Social History   Tobacco Use  . Smoking status: Never Smoker  . Smokeless tobacco: Never Used  Substance Use Topics  . Alcohol use: No  . Drug use: No     Allergies   Codeine; Oxycodone-acetaminophen; Sulfamethoxazole-trimethoprim; Celebrex [celecoxib]; Hydrocodone-acetaminophen; Losartan; Lyrica [pregabalin]; and Tramadol   Review of Systems Review of Systems  All other systems reviewed and are negative.    Physical Exam Updated Vital Signs BP 138/76   Pulse 75   Temp 98.4 F (36.9 C) (Oral)   Resp 14   Ht 1.575 m (5\' 2" )   Wt 79.4 kg   SpO2 98%   BMI 32.01 kg/m   Physical Exam Vitals signs and nursing note reviewed.  Constitutional:      General: She is not in acute distress.    Appearance: She is well-developed.  HENT:     Head: Normocephalic.     Comments: 2 cm laceration just left of the midline at the crown of the head.  No surrounding hematomas, no active bleeding    Mouth/Throat:     Pharynx: No oropharyngeal exudate.  Eyes:     General: No scleral icterus.       Right eye: No discharge.        Left eye: No discharge.     Conjunctiva/sclera: Conjunctivae normal.     Pupils: Pupils are equal, round, and reactive to light.  Neck:     Musculoskeletal: Normal range of motion and neck supple.     Thyroid: No thyromegaly.     Vascular: No JVD.  Cardiovascular:     Rate and Rhythm: Normal rate and regular  rhythm.     Heart sounds: Normal heart sounds. No murmur. No friction rub. No gallop.   Pulmonary:     Effort: Pulmonary effort is normal. No respiratory distress.     Breath sounds: Normal breath sounds. No wheezing or rales.  Abdominal:     General: Bowel sounds are normal. There is no distension.     Palpations: Abdomen is soft. There is no mass.     Tenderness: There is no abdominal tenderness.  Musculoskeletal: Normal range of motion.        General: No tenderness.     Comments: All 4 extremities with normal range of motion, soft compartments and supple joints diffusely.  No tenderness over the posterior cervical spine.  Lymphadenopathy:     Cervical: No cervical adenopathy.  Skin:    General: Skin is warm and dry.     Findings: No erythema or rash.  Neurological:     Mental Status: She is alert.     Coordination: Coordination normal.     Comments: Neurologic exam:  Speech clear, pupils equal round reactive to light, extraocular movements intact  Normal peripheral visual fields Cranial nerves III through XII normal including no facial droop Follows commands, moves all extremities x4, normal strength to bilateral upper and lower extremities at all major muscle groups including grip Sensation normal to light touch and pinprick Coordination intact, no limb ataxia, finger-nose-finger normal, heel shin normal bilaterally Rapid alternating movements normal No pronator drift Gait normal Can heal and toe walk without weakness.'   Psychiatric:        Behavior: Behavior normal.  ED Treatments / Results  Labs (all labs ordered are listed, but only abnormal results are displayed) Labs Reviewed - No data to display  EKG None  Radiology Ct Head Wo Contrast  Result Date: 12/26/2018 CLINICAL DATA:  Head injury EXAM: CT HEAD WITHOUT CONTRAST TECHNIQUE: Contiguous axial images were obtained from the base of the skull through the vertex without intravenous contrast.  COMPARISON:  06/06/2008 FINDINGS: Brain: There is no mass effect, midline shift, or acute intracranial hemorrhage. Mild chronic ischemic changes in the periventricular white matter. Vascular: No hyperdense vessel or unexpected calcification. Skull: Cranium is intact. Sinuses/Orbits: Mastoid air cells are clear partial consolidation at the base of the right sphenoid sinus is stable. Paranasal sinuses are otherwise clear. Orbits are within normal limits. Other: Soft tissue injury involving the scalp at the left high vertex is noted. IMPRESSION: No acute intracranial pathology. Electronically Signed   By: Marybelle Killings M.D.   On: 12/26/2018 18:17    Procedures .Marland KitchenLaceration Repair Date/Time: 12/26/2018 6:45 PM Performed by: Noemi Chapel, MD Authorized by: Noemi Chapel, MD   Consent:    Consent obtained:  Verbal   Consent given by:  Patient   Risks discussed:  Infection, pain, need for additional repair, poor cosmetic result and poor wound healing   Alternatives discussed:  No treatment and delayed treatment Laceration details:    Location:  Scalp   Scalp location:  Crown   Length (cm):  2   Depth (mm):  2 Repair type:    Repair type:  Simple Pre-procedure details:    Preparation:  Patient was prepped and draped in usual sterile fashion and imaging obtained to evaluate for foreign bodies Exploration:    Hemostasis achieved with:  Direct pressure   Wound exploration: wound explored through full range of motion and entire depth of wound probed and visualized     Wound extent: no fascia violation noted, no foreign bodies/material noted, no muscle damage noted, no nerve damage noted, no tendon damage noted, no underlying fracture noted and no vascular damage noted     Contaminated: no   Treatment:    Area cleansed with:  Betadine   Amount of cleaning:  Standard   Irrigation solution:  Sterile saline   Irrigation method:  Syringe Skin repair:    Repair method:  Staples   Number of staples:   2 Approximation:    Approximation:  Close Post-procedure details:    Dressing:  Antibiotic ointment   Patient tolerance of procedure:  Tolerated well, no immediate complications Comments:     No violation of the galea   (including critical care time)  Medications Ordered in ED Medications  lidocaine (PF) (XYLOCAINE) 1 % injection (has no administration in time range)  bacitracin ointment 1 application (has no administration in time range)  HYDROcodone-acetaminophen (NORCO/VICODIN) 5-325 MG per tablet 2 tablet (2 tablets Oral Given 12/26/18 1722)  diphenhydrAMINE (BENADRYL) capsule 25 mg (25 mg Oral Given 12/26/18 1722)  povidone-iodine (BETADINE) 10 % external solution (  Given 12/26/18 1722)     Initial Impression / Assessment and Plan / ED Course  I have reviewed the triage vital signs and the nursing notes.  Pertinent labs & imaging results that were available during my care of the patient were reviewed by me and considered in my medical decision making (see chart for details).     Overall the patient is well-appearing but does have signs of a significant head injury with severe headache after a stab type wound to  the top of the head.  We will get imaging to rule out skull fracture or brain injury, primary repair seem straightforward with linear laceration which is stop bleeding.  Neurologic status is intact  CT neg Pt stable for d;c Normal mental status prior to d/c.  Final Clinical Impressions(s) / ED Diagnoses   Final diagnoses:  Laceration of scalp, initial encounter  Minor head injury, initial encounter    ED Discharge Orders         Ordered    meloxicam (MOBIC) 7.5 MG tablet  2 times daily     12/26/18 1844           Noemi Chapel, MD 12/26/18 [phone]

## 2018-12-26 NOTE — Discharge Instructions (Signed)
Laceration:  The laceration is a cut or lesion that goes through all layers of the skin and into the tissue just beneath the skin. This may have been repaired by your caregiver with either stitches or a tissue adhesive similar to a super glue.  Please keep your wound clean and dry with a topical antibiotic and a sterile dressing for the next 48 hours. Your wound should be reevaluated by your family doctor within the next 2 days for a recheck. If you do not have a family doctor you may return to the emergency department for a recheck or see the list of followup doctors below.  Seek medical attention if:  There is redness, swelling, increasing pain in the wound There is a red line that goes up your arm or leg Pus is coming from the wound He developed an unexplained temperature above 100.57F He noticed a foul-smelling coming from the wound or dressing There is a breaking open of the wound after the sutures have been removed If you did not receive a tetanus shot today because she thought she were up to date but did not recall when her last one was given, nature to check with her primary caregiver to determine if she needs one.

## 2019-01-01 ENCOUNTER — Ambulatory Visit (INDEPENDENT_AMBULATORY_CARE_PROVIDER_SITE_OTHER): Payer: PPO

## 2019-01-01 ENCOUNTER — Encounter: Payer: Self-pay | Admitting: Orthopedic Surgery

## 2019-01-01 ENCOUNTER — Ambulatory Visit: Payer: PPO | Admitting: Orthopedic Surgery

## 2019-01-01 VITALS — BP 127/79 | HR 68 | Ht 62.0 in | Wt 180.0 lb

## 2019-01-01 DIAGNOSIS — Z96651 Presence of right artificial knee joint: Secondary | ICD-10-CM

## 2019-01-01 DIAGNOSIS — M65332 Trigger finger, left middle finger: Secondary | ICD-10-CM

## 2019-01-01 DIAGNOSIS — M6530 Trigger finger, unspecified finger: Secondary | ICD-10-CM

## 2019-01-01 NOTE — Progress Notes (Signed)
ANNUAL FOLLOW UP FOR  Right  TKA   Chief Complaint  Patient presents with  . Follow-up    Yearly recheck on right TKA, DOS 12-29-17.  Marland Kitchen Hand Problem    left long finger triggering     HPI: The patient is here for the annual  follow-up x-ray for knee replacement. The patient is not complaining of pain weakness instability or stiffness in the repaired knee.   ROS-growing of 1 of the digits follow-up will be Tuesday  Pain in the morning lateral right knee   Past Medical History:  Diagnosis Date  . Acid reflux   . Anxiety   . Arthritis   . CHF (congestive heart failure) (HCC)    90's swelling body-no breathing problems at that time-no cardiac -in dr s Wolfgang [place] office [place] give lasix and potassium  . Complication of anesthesia    History of being told she was allergic to something in 1980 no problems since  . Depression   . Fibromyalgia   . History of blood transfusion reaction 1978  . History of hiatal hernia   . HNP (herniated nucleus pulposus)   . Hypertension    Does not see a cardiologist no stress or echo  . Joint pain   . Nerve pain    right leg   . Ruptured lumbar disc   . Sleep apnea    Uses CPAP every night setting at 7 mm/Hg     Examination of the right  KNEE  BP 127/79   Pulse 68   Ht 5\' 2"  (1.575 m)   Wt 180 lb (81.6 kg)   BMI 32.92 kg/m   General the patient is normally groomed in no distress  Mood normal Affect pleasant   The patient is Awake and alert ; oriented normal   Inspection shows : incision healed nicely without erythema, no tenderness no swelling  Range of motion total range of motion is 125  Stability the knee is stable anterior to posterior as well as medial to lateral  Strength quadriceps strength is normal  Skin no erythema around the skin incision  Cardiovascular NO EDEMA   Neuro: normal sensation in the operative leg   Gait: normal expected gait without cane   Finger: triggering left hand long finger 2 months of  triggering pain at night pain over the A1 pulley Left hand alignment looks normal she is tender over the A1 pulley she has full range of motion finger stable strength in the flexor tendons normal skin is intact pulses and perfusion capillary refill are normal temperature is normal there is no lymphadenopathy in the extremity sensation is intact   Medical decision-making section  X-rays ordered with the following personal interpretation  Normal alignment without loosening   Diagnosis  Encounter Diagnoses  Name Primary?  . History of total right knee replacement 2019 Yes  . Trigger finger, acquired     Trigger finger injection  Diagnosis left long finger Procedure injection A1 pulley Medications lidocaine 1% 1 mL and Depo-Medrol 40 mg 1 mL Skin prep alcohol and ethyl chloride Verbal consent was obtained Timeout confirmed the injection site  After cleaning the skin with alcohol and anesthetizing the skin with ethyl chloride the A1 pulley was palpated and the injection was performed without complication   Plan follow-up 1 year repeat x-rays

## 2019-01-03 ENCOUNTER — Ambulatory Visit: Payer: PPO | Admitting: Orthopedic Surgery

## 2019-01-03 ENCOUNTER — Encounter: Payer: Self-pay | Admitting: Family Medicine

## 2019-01-03 ENCOUNTER — Ambulatory Visit (INDEPENDENT_AMBULATORY_CARE_PROVIDER_SITE_OTHER): Payer: PPO | Admitting: Family Medicine

## 2019-01-03 VITALS — BP 138/80 | Temp 98.7°F | Ht 62.0 in | Wt 182.0 lb

## 2019-01-03 DIAGNOSIS — W278XXD Contact with other nonpowered hand tool, subsequent encounter: Secondary | ICD-10-CM | POA: Diagnosis not present

## 2019-01-03 DIAGNOSIS — Z23 Encounter for immunization: Secondary | ICD-10-CM | POA: Diagnosis not present

## 2019-01-03 DIAGNOSIS — R51 Headache: Secondary | ICD-10-CM

## 2019-01-03 DIAGNOSIS — S0101XD Laceration without foreign body of scalp, subsequent encounter: Secondary | ICD-10-CM

## 2019-01-03 NOTE — Progress Notes (Signed)
   Subjective:    Patient ID: Emily [name], female    DOB: March 17, 1951, 68 y.o.   MRN: [id number]  HPIpt arrives to have stables removed from head. Put in at ED on 12/26/18.  I reviewed over the ER note Has 2 staples need to be removed  Had cortisone injection this past Monday in left  hand swelled and still having pain. Pt states she never had her hand swell after injection and pain usually last about one day.     Review of Systems Denies fever chills sweats nausea vomiting diarrhea headaches dizziness blurred vision    Objective:   Physical Exam Some puffiness noted in the hand no sign of infection she does have a difficult time straightening her fingers all the way out and has difficult time bending them then  The head laceration seems to be healed well     Assessment & Plan:  Sutures were removed without difficulty 2 staples Warning signs and proper care discussed Due for tetanus shot due to the laceration  Discussion held with patient regarding puffiness of the hand it is possible there could be some local irritation from the cortisone or possibly a localized allergy I recommend she discuss this further with orthopedics the next time she goes  Hold off on any lab work at this time

## 2019-01-26 DIAGNOSIS — G4733 Obstructive sleep apnea (adult) (pediatric): Secondary | ICD-10-CM | POA: Diagnosis not present

## 2019-03-12 DIAGNOSIS — N3010 Interstitial cystitis (chronic) without hematuria: Secondary | ICD-10-CM | POA: Diagnosis not present

## 2019-03-12 DIAGNOSIS — M25662 Stiffness of left knee, not elsewhere classified: Secondary | ICD-10-CM | POA: Diagnosis not present

## 2019-03-12 DIAGNOSIS — M059 Rheumatoid arthritis with rheumatoid factor, unspecified: Secondary | ICD-10-CM | POA: Diagnosis not present

## 2019-03-12 DIAGNOSIS — M79662 Pain in left lower leg: Secondary | ICD-10-CM | POA: Diagnosis not present

## 2019-03-12 DIAGNOSIS — F5101 Primary insomnia: Secondary | ICD-10-CM | POA: Diagnosis not present

## 2019-03-12 DIAGNOSIS — E782 Mixed hyperlipidemia: Secondary | ICD-10-CM | POA: Diagnosis not present

## 2019-03-12 DIAGNOSIS — H2640 Unspecified secondary cataract: Secondary | ICD-10-CM | POA: Diagnosis not present

## 2019-03-12 DIAGNOSIS — M8949 Other hypertrophic osteoarthropathy, multiple sites: Secondary | ICD-10-CM | POA: Diagnosis not present

## 2019-03-12 DIAGNOSIS — M6281 Muscle weakness (generalized): Secondary | ICD-10-CM | POA: Diagnosis not present

## 2019-03-12 DIAGNOSIS — K2100 Gastro-esophageal reflux disease with esophagitis, without bleeding: Secondary | ICD-10-CM | POA: Diagnosis not present

## 2019-03-12 DIAGNOSIS — M810 Age-related osteoporosis without current pathological fracture: Secondary | ICD-10-CM | POA: Diagnosis not present

## 2019-03-12 DIAGNOSIS — D696 Thrombocytopenia, unspecified: Secondary | ICD-10-CM | POA: Diagnosis not present

## 2019-03-12 DIAGNOSIS — Z79899 Other long term (current) drug therapy: Secondary | ICD-10-CM | POA: Diagnosis not present

## 2019-03-12 DIAGNOSIS — R5381 Other malaise: Secondary | ICD-10-CM | POA: Diagnosis not present

## 2019-03-12 DIAGNOSIS — Z961 Presence of intraocular lens: Secondary | ICD-10-CM | POA: Diagnosis not present

## 2019-03-12 DIAGNOSIS — E039 Hypothyroidism, unspecified: Secondary | ICD-10-CM | POA: Diagnosis not present

## 2019-03-12 DIAGNOSIS — I10 Essential (primary) hypertension: Secondary | ICD-10-CM | POA: Diagnosis not present

## 2019-03-12 DIAGNOSIS — H353132 Nonexudative age-related macular degeneration, bilateral, intermediate dry stage: Secondary | ICD-10-CM | POA: Diagnosis not present

## 2019-03-29 DIAGNOSIS — H26492 Other secondary cataract, left eye: Secondary | ICD-10-CM | POA: Diagnosis not present

## 2019-04-18 ENCOUNTER — Ambulatory Visit (INDEPENDENT_AMBULATORY_CARE_PROVIDER_SITE_OTHER): Payer: PPO | Admitting: Family Medicine

## 2019-04-18 ENCOUNTER — Other Ambulatory Visit: Payer: Self-pay

## 2019-04-18 VITALS — BP 124/72 | Temp 98.8°F

## 2019-04-18 DIAGNOSIS — I10 Essential (primary) hypertension: Secondary | ICD-10-CM

## 2019-04-18 DIAGNOSIS — K219 Gastro-esophageal reflux disease without esophagitis: Secondary | ICD-10-CM

## 2019-04-18 DIAGNOSIS — R42 Dizziness and giddiness: Secondary | ICD-10-CM

## 2019-04-18 MED ORDER — DULOXETINE HCL 60 MG PO CPEP: 60.0000 mg | ORAL_CAPSULE | Freq: Every day | ORAL | 5 refills | Status: DC

## 2019-04-18 MED ORDER — PANTOPRAZOLE SODIUM 40 MG PO TBEC: 40.0000 mg | DELAYED_RELEASE_TABLET | Freq: Every day | ORAL | 8 refills | Status: DC

## 2019-04-18 MED ORDER — PRAVASTATIN SODIUM 20 MG PO TABS: 20.0000 mg | ORAL_TABLET | Freq: Every day | ORAL | 5 refills | Status: DC

## 2019-04-18 MED ORDER — HYDROCHLOROTHIAZIDE 25 MG PO TABS: 25.0000 mg | ORAL_TABLET | Freq: Every day | ORAL | 5 refills | Status: DC

## 2019-04-18 MED ORDER — HYDROCODONE-ACETAMINOPHEN 5-325 MG PO TABS: ORAL_TABLET | ORAL | 0 refills | Status: DC

## 2019-04-18 MED ORDER — VERAPAMIL HCL ER 180 MG PO TBCR: 180.0000 mg | EXTENDED_RELEASE_TABLET | Freq: Every day | ORAL | 5 refills | Status: DC

## 2019-04-18 MED ORDER — POTASSIUM CHLORIDE CRYS ER 20 MEQ PO TBCR: 20.0000 meq | EXTENDED_RELEASE_TABLET | Freq: Every day | ORAL | 5 refills | Status: DC

## 2019-04-18 NOTE — Progress Notes (Signed)
   Subjective:  Patient seen in person  Patient ID: Emily [name], female    DOB: [birth year], 68 y.o.   MRN: [id number]  Hyperlipidemia  This is a chronic problem. Pertinent negatives include no chest pain or shortness of breath. Treatments tried: pravastatin 20mg . There are no compliance problems (takes meds every day, eats healthy, waks for exercise).    Dizziness in the morning and when tilting head up. Started over 6 months ago.  She relates dizziness when she lifts her head up and sometimes if she turns quickly she also notices that things that move by her eyes quickly can cause her to feel dizzy she denies nausea or double vision denies headaches Denies unilateral numbness weakness Review of Systems  Constitutional: Negative for activity change, appetite change and fatigue.  HENT: Negative for congestion and rhinorrhea.   Respiratory: Negative for cough and shortness of breath.   Cardiovascular: Negative for chest pain and leg swelling.  Gastrointestinal: Negative for abdominal pain and diarrhea.  Endocrine: Negative for polydipsia and polyphagia.  Skin: Negative for color change.  Neurological: Positive for dizziness. Negative for facial asymmetry, weakness and headaches.  Psychiatric/Behavioral: Negative for behavioral problems and confusion.       Objective:   Physical Exam Vitals signs reviewed.  Constitutional:      General: She is not in acute distress. HENT:     Head: Normocephalic and atraumatic.  Eyes:     General:        Right eye: No discharge.        Left eye: No discharge.  Neck:     Trachea: No tracheal deviation.  Cardiovascular:     Rate and Rhythm: Normal rate and regular rhythm.     Heart sounds: Normal heart sounds. No murmur.  Pulmonary:     Effort: Pulmonary effort is normal. No respiratory distress.     Breath sounds: Normal breath sounds.  Lymphadenopathy:     Cervical: No cervical adenopathy.  Skin:    General: Skin is warm and dry.   Neurological:     Mental Status: She is alert.     Coordination: Coordination normal.  Psychiatric:        Behavior: Behavior normal.      15 minutes was spent with patient today discussing healthcare issues which they came.  More than 50% of this visit-total duration of visit-was spent in counseling and coordination of care.  Please see diagnosis regarding the focus of this coordination and care      Assessment & Plan:  I do not hear any carotid bruits but we will check carotid ultrasound patient has risk factors for vascular disease  Possibly the mild dizziness is just positional vertigo related issues I would not recommend any aggressive medication at this point  She is doing well on her other medicines refills were given She does use hydrocodone occasionally for back pain will not use it frequently.  If the dizziness persists she will follow-up  Otherwise follow-up within 5 months

## 2019-05-30 ENCOUNTER — Other Ambulatory Visit (HOSPITAL_COMMUNITY): Payer: Self-pay | Admitting: Family Medicine

## 2019-05-30 DIAGNOSIS — Z1231 Encounter for screening mammogram for malignant neoplasm of breast: Secondary | ICD-10-CM

## 2019-06-06 DIAGNOSIS — B078 Other viral warts: Secondary | ICD-10-CM | POA: Diagnosis not present

## 2019-06-06 DIAGNOSIS — L821 Other seborrheic keratosis: Secondary | ICD-10-CM | POA: Diagnosis not present

## 2019-06-13 ENCOUNTER — Ambulatory Visit (HOSPITAL_COMMUNITY)
Admission: RE | Admit: 2019-06-13 | Discharge: 2019-06-13 | Disposition: A | Discharge status: Home / Self Care | Payer: PPO | Source: Ambulatory Visit | Attending: Family Medicine | Admitting: Family Medicine

## 2019-06-13 ENCOUNTER — Other Ambulatory Visit: Payer: Self-pay

## 2019-06-13 DIAGNOSIS — R42 Dizziness and giddiness: Secondary | ICD-10-CM | POA: Diagnosis not present

## 2019-06-13 DIAGNOSIS — I6523 Occlusion and stenosis of bilateral carotid arteries: Secondary | ICD-10-CM | POA: Diagnosis not present

## 2019-06-13 DIAGNOSIS — Z1231 Encounter for screening mammogram for malignant neoplasm of breast: Secondary | ICD-10-CM | POA: Diagnosis not present

## 2019-07-19 ENCOUNTER — Other Ambulatory Visit: Payer: Self-pay

## 2019-07-19 DIAGNOSIS — Z20822 Contact with and (suspected) exposure to covid-19: Secondary | ICD-10-CM

## 2019-07-21 LAB — NOVEL CORONAVIRUS, NAA: SARS-CoV-2, NAA: NOT DETECTED

## 2019-08-03 ENCOUNTER — Ambulatory Visit: Payer: PPO | Admitting: Orthopedic Surgery

## 2019-08-07 ENCOUNTER — Ambulatory Visit (INDEPENDENT_AMBULATORY_CARE_PROVIDER_SITE_OTHER): Payer: PPO | Admitting: Orthopedic Surgery

## 2019-08-07 ENCOUNTER — Encounter: Payer: Self-pay | Admitting: Orthopedic Surgery

## 2019-08-07 ENCOUNTER — Other Ambulatory Visit: Payer: Self-pay

## 2019-08-07 VITALS — BP 132/58 | HR 71 | Ht 62.0 in | Wt 185.0 lb

## 2019-08-07 DIAGNOSIS — M6530 Trigger finger, unspecified finger: Secondary | ICD-10-CM

## 2019-08-07 NOTE — Progress Notes (Signed)
Chief Complaint  Patient presents with  . Hand Pain    left middle finger triggering    Post injection left long finger for triggering presents with recurrent triggering and pain over the A1 pulley Trigger finger injection  Diagnosis flexor tenosynovitis left long finger Procedure injection A1 pulley Medications lidocaine 1% 1 mL and Depo-Medrol 40 mg 1 mL Skin prep alcohol and ethyl chloride Verbal consent was obtained Timeout confirmed the injection site  After cleaning the skin with alcohol and anesthetizing the skin with ethyl chloride the A1 pulley was palpated and the injection was performed without complication   Encounter Diagnosis  Name Primary?  . Trigger finger, acquired Yes    Call the office in 3 weeks if not better we can arrange surgery at that time by phone if needed

## 2019-08-10 ENCOUNTER — Ambulatory Visit: Payer: PPO | Admitting: Orthopedic Surgery

## 2019-08-30 IMAGING — CR DG KNEE 1-2V PORT*R*
1 series · 2 of 2 positions shown · non-contrast
Comparison: 06/10/2017.

CLINICAL DATA: Total knee replacement.

EXAM:
PORTABLE RIGHT KNEE - 1-2 VIEW

[Series 1: ap · 0.17mm/px · 2 of 2 slices shown]
[im 1/2]
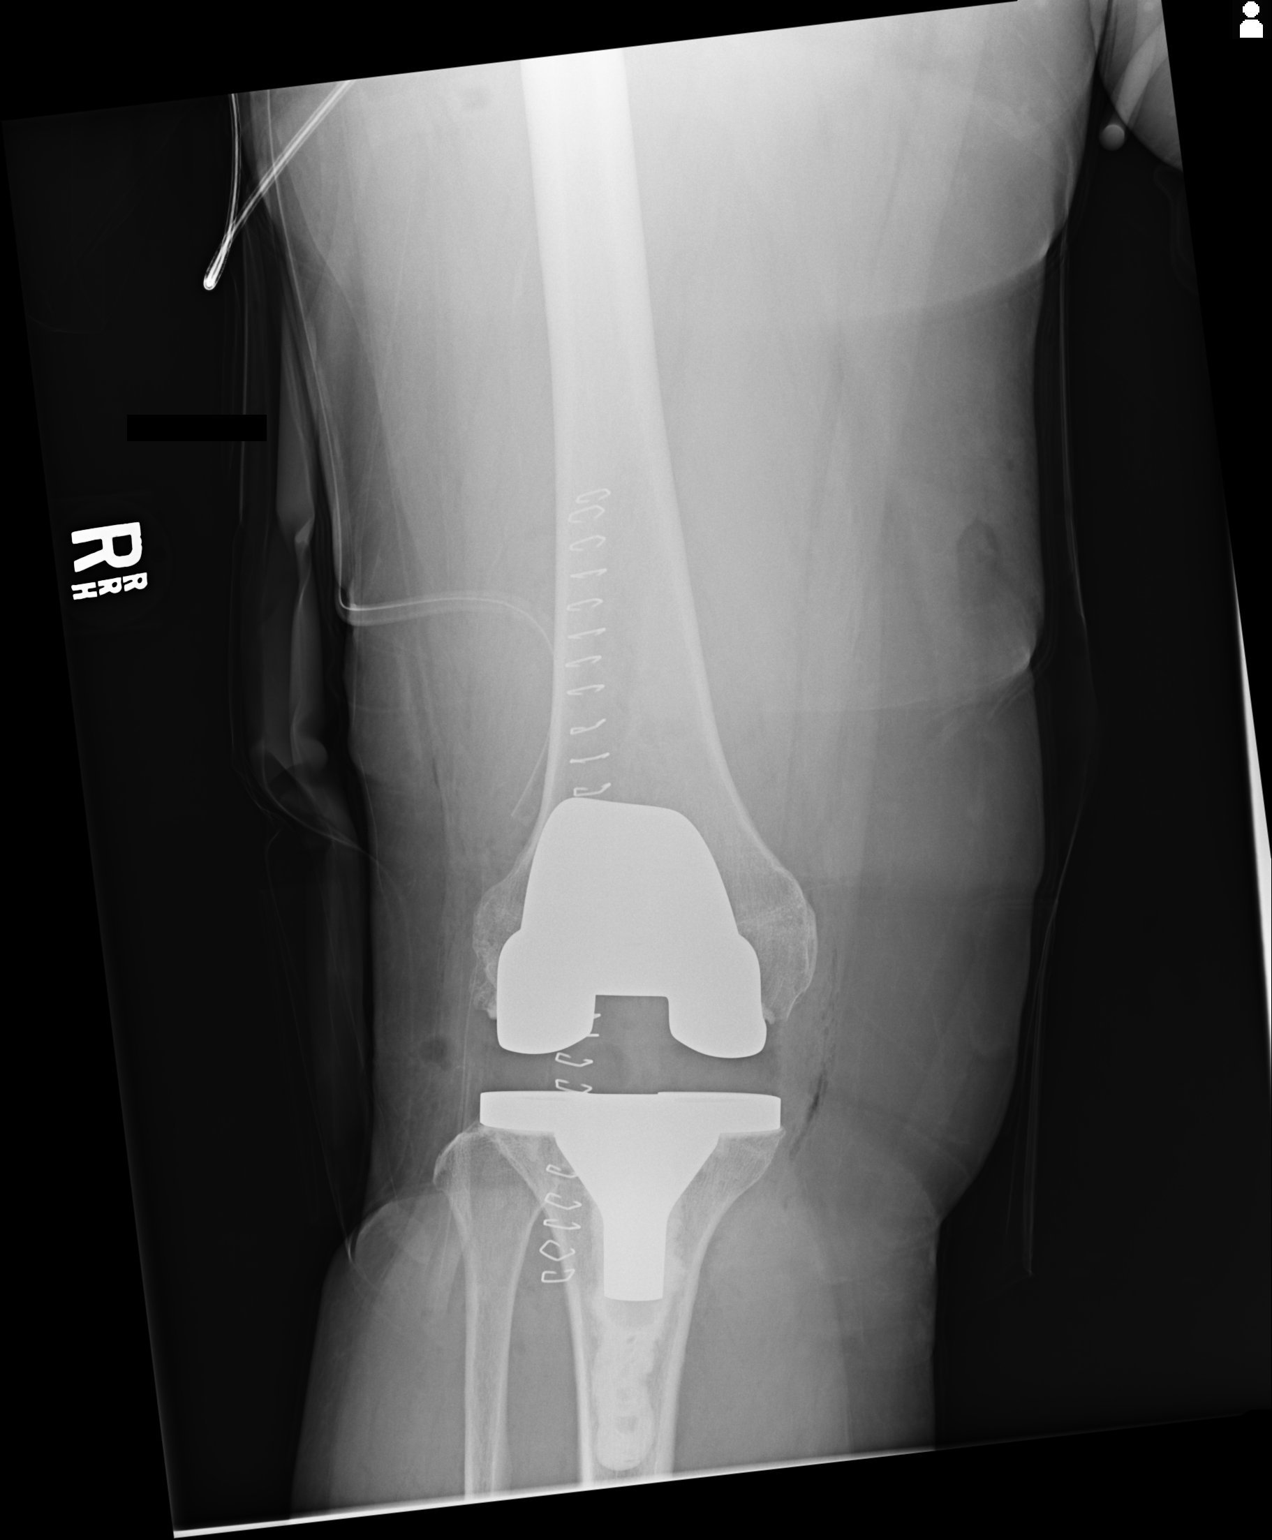
[im 2/2]
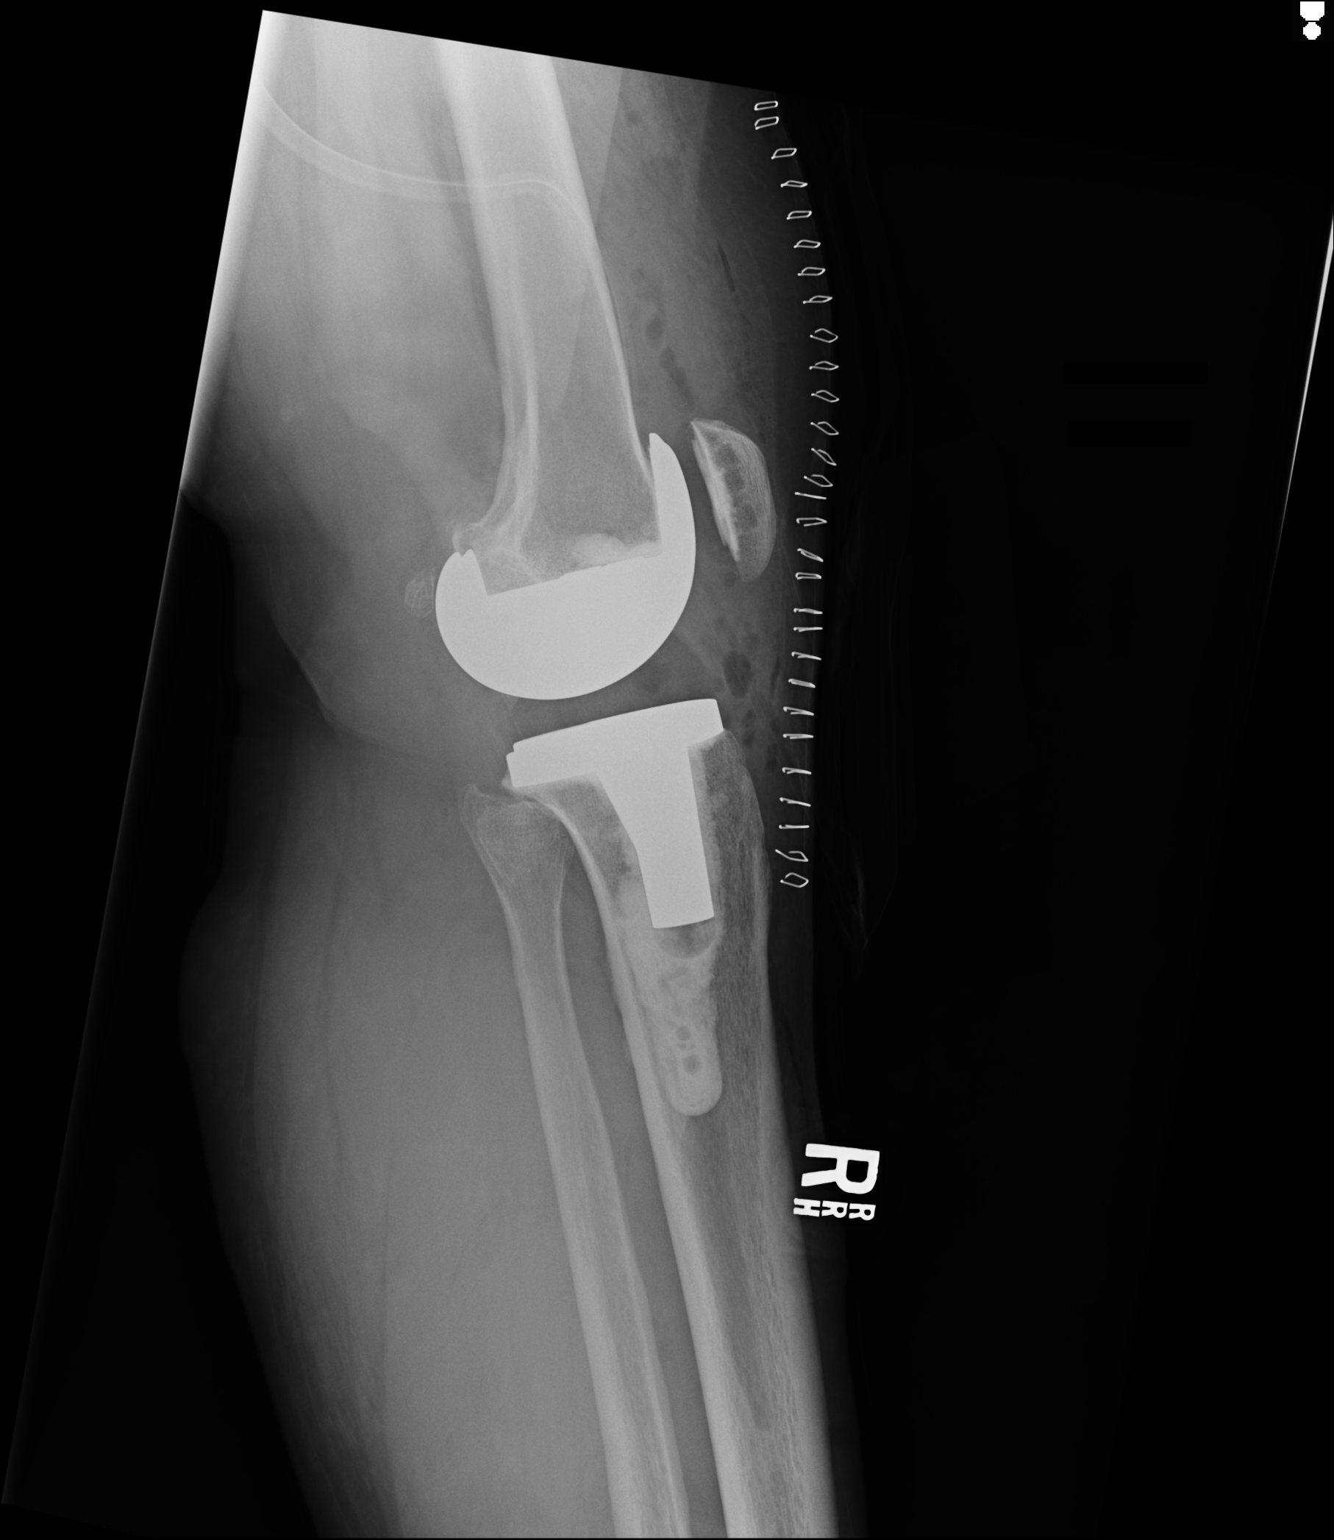

[2 of 2 positions shown; findings below may reference images not displayed]

FINDINGS: Total right knee replacement anatomic alignment.  Hardware intact.
IMPRESSION: Total right knee replacement with anatomic alignment.

## 2019-09-10 ENCOUNTER — Ambulatory Visit (INDEPENDENT_AMBULATORY_CARE_PROVIDER_SITE_OTHER): Payer: PPO | Admitting: Family Medicine

## 2019-09-10 ENCOUNTER — Encounter: Payer: Self-pay | Admitting: Family Medicine

## 2019-09-10 ENCOUNTER — Other Ambulatory Visit: Payer: Self-pay

## 2019-09-10 VITALS — BP 124/74

## 2019-09-10 DIAGNOSIS — Z79899 Other long term (current) drug therapy: Secondary | ICD-10-CM

## 2019-09-10 DIAGNOSIS — M797 Fibromyalgia: Secondary | ICD-10-CM

## 2019-09-10 DIAGNOSIS — E781 Pure hyperglyceridemia: Secondary | ICD-10-CM

## 2019-09-10 DIAGNOSIS — K219 Gastro-esophageal reflux disease without esophagitis: Secondary | ICD-10-CM

## 2019-09-10 DIAGNOSIS — F439 Reaction to severe stress, unspecified: Secondary | ICD-10-CM | POA: Diagnosis not present

## 2019-09-10 DIAGNOSIS — I10 Essential (primary) hypertension: Secondary | ICD-10-CM | POA: Diagnosis not present

## 2019-09-10 HISTORY — DX: Fibromyalgia: M79.7

## 2019-09-10 MED ORDER — HYDROCHLOROTHIAZIDE 25 MG PO TABS: 25.0000 mg | ORAL_TABLET | Freq: Every day | ORAL | 5 refills | Status: DC

## 2019-09-10 MED ORDER — DULOXETINE HCL 60 MG PO CPEP: 60.0000 mg | ORAL_CAPSULE | Freq: Every day | ORAL | 5 refills | Status: DC

## 2019-09-10 MED ORDER — VERAPAMIL HCL ER 180 MG PO TBCR: 180.0000 mg | EXTENDED_RELEASE_TABLET | Freq: Every day | ORAL | 5 refills | Status: DC

## 2019-09-10 MED ORDER — PANTOPRAZOLE SODIUM 40 MG PO TBEC: 40.0000 mg | DELAYED_RELEASE_TABLET | Freq: Every day | ORAL | 5 refills | Status: DC

## 2019-09-10 MED ORDER — POTASSIUM CHLORIDE CRYS ER 20 MEQ PO TBCR: 20.0000 meq | EXTENDED_RELEASE_TABLET | Freq: Every day | ORAL | 5 refills | Status: DC

## 2019-09-10 NOTE — Progress Notes (Signed)
Subjective:    Patient ID: Emily [name], female    DOB: 1951-04-26, 68 y.o.   MRN: [id number]  Hypertension This is a chronic problem. The current episode started more than 1 year ago. Pertinent negatives include no chest pain or shortness of breath. Risk factors for coronary artery disease include dyslipidemia and post-menopausal state. Treatments tried: verapamil, hctz. There are no compliance problems.    BP running 120s/70s Patient feel her eye medication was what was causing the vertigo Patient has had 2 bouts with her stomach recently She also uses medication intermittently to help this patient has significant troubles with fibromyalgia.  She tries stretching she tries other things it seems to help some  Patient does have ongoing trouble with reflux.  Takes medication on a regular basis.  Tries to minimize foods as best they can.  They understand the importance of dietary compliance.  May also try to avoid eating a large meal close to bedtime.  Patient denies any dysphagia denies hematochezia.  States medicine does a good job keeping the problem under good control.  Without the medication may certainly have issues.They desire to continue taking their medication.   This patient has significant fibromyalgia she tries stretching she does do a little bit of walkingPatient under a lot of stress at home her grandson has mental health problems will not take his medication they are essentially doing everything they can to try to help him Virtual Visit via Video Note  I connected with Emily [name] on 09/10/19 at  9:00 AM EDT by a video enabled telemedicine application and verified that I am speaking with the correct person using two identifiers.  Location: Patient: home Provider: office   I discussed the limitations of evaluation and management by telemedicine and the availability of in person appointments. The patient expressed understanding and agreed to proceed.  History of Present  Illness:    Observations/Objective:   Assessment and Plan:   Follow Up Instructions:    I discussed the assessment and treatment plan with the patient. The patient was provided an opportunity to ask questions and all were answered. The patient agreed with the plan and demonstrated an understanding of the instructions.   The patient was advised to call back or seek an in-person evaluation if the symptoms worsen or if the condition fails to improve as anticipated.  I provided 25 minutes of non-face-to-face time during this encounter.      Review of Systems  Constitutional: Negative for activity change, appetite change and fatigue.  HENT: Negative for congestion and rhinorrhea.   Respiratory: Negative for cough and shortness of breath.   Cardiovascular: Negative for chest pain and leg swelling.  Gastrointestinal: Negative for abdominal pain and diarrhea.  Endocrine: Negative for polydipsia and polyphagia.  Musculoskeletal: Positive for arthralgias and back pain.  Skin: Negative for color change.  Neurological: Negative for dizziness and weakness.  Psychiatric/Behavioral: Negative for behavioral problems and confusion.       Objective:   Physical Exam  Today's visit was via telephone Physical exam was not possible for this visit  25 minutes was spent with the patient.  This statement verifies that 25 minutes was indeed spent with the patient.  More than 50% of this visit-total duration of the visit-was spent in counseling and coordination of care. The issues that the patient came in for today as reflected in the diagnosis (s) please refer to documentation for further details.      Assessment & Plan:  1. HTN (hypertension), benign Blood pressure good response continue current medication watch diet minimize salt in the diet - Basic metabolic panel  2. GERD without esophagitis Under good control continue current medication  3. Fibromyalgia Cymbalta helps with this  stretching exercises as well  4. Hypertriglyceridemia Check lab work continue medication watch diet closely - Lipid panel  5. Stress at home Significant stress at home made worse by situation with her grandson trying to manage as best as possible  6. High risk medication use Lab work indicated - Hepatic function panel

## 2019-09-27 DIAGNOSIS — E781 Pure hyperglyceridemia: Secondary | ICD-10-CM | POA: Diagnosis not present

## 2019-09-27 DIAGNOSIS — I10 Essential (primary) hypertension: Secondary | ICD-10-CM | POA: Diagnosis not present

## 2019-09-27 DIAGNOSIS — Z79899 Other long term (current) drug therapy: Secondary | ICD-10-CM | POA: Diagnosis not present

## 2019-09-28 LAB — LIPID PANEL
Chol/HDL Ratio: 4.4 ratio (ref 0.0–4.4)
Cholesterol, Total: 164 mg/dL (ref 100–199)
HDL: 37 mg/dL — ABNORMAL LOW (ref >39)
LDL Chol Calc (NIH): 71 mg/dL (ref 0–99)
Triglycerides: 355 mg/dL — ABNORMAL HIGH (ref 0–149)
VLDL Cholesterol Cal: 56 mg/dL — ABNORMAL HIGH (ref 5–40)

## 2019-09-28 LAB — BASIC METABOLIC PANEL
BUN/Creatinine Ratio: 16 (ref 12–28)
BUN: 11 mg/dL (ref 8–27)
CO2: 24 mmol/L (ref 20–29)
Calcium: 9.1 mg/dL (ref 8.7–10.3)
Chloride: 105 mmol/L (ref 96–106)
Creatinine, Ser: 0.68 mg/dL (ref 0.57–1.00)
GFR calc Af Amer: 105 mL/min/{1.73_m2} (ref >59)
GFR calc non Af Amer: 91 mL/min/{1.73_m2} (ref >59)
Glucose: 103 mg/dL — ABNORMAL HIGH (ref 65–99)
Potassium: 3.9 mmol/L (ref 3.5–5.2)
Sodium: 142 mmol/L (ref 134–144)

## 2019-09-28 LAB — HEPATIC FUNCTION PANEL
ALT: 20 IU/L (ref 0–32)
AST: 19 IU/L (ref 0–40)
Albumin: 4.3 g/dL (ref 3.8–4.8)
Alkaline Phosphatase: 85 IU/L (ref 39–117)
Bilirubin Total: 0.4 mg/dL (ref 0.0–1.2)
Bilirubin, Direct: 0.1 mg/dL (ref 0.00–0.40)
Total Protein: 6.8 g/dL (ref 6.0–8.5)

## 2019-12-06 ENCOUNTER — Ambulatory Visit (INDEPENDENT_AMBULATORY_CARE_PROVIDER_SITE_OTHER): Payer: PPO | Admitting: Family Medicine

## 2019-12-06 ENCOUNTER — Other Ambulatory Visit: Payer: Self-pay

## 2019-12-06 DIAGNOSIS — H6501 Acute serous otitis media, right ear: Secondary | ICD-10-CM | POA: Diagnosis not present

## 2019-12-06 MED ORDER — CEFDINIR 300 MG PO CAPS: ORAL_CAPSULE | ORAL | 0 refills | Status: DC

## 2019-12-06 NOTE — Progress Notes (Signed)
   Subjective:  Audio only  Patient ID: Emily [name], female    DOB: Apr 19, 1951, 68 y.o.   MRN: [id number]  HPI Pt is having pain in right ear. Lymph node on right side is swollen. Sinus headache, pain in upper jaw. No fever. If pt bends over and raises up she gets dizzy. Ear pain began yesterday. Pt has tried Tylenol with no relief.   Virtual Visit via Telephone Note  I connected with [name] on 12/06/19 at 11:00 AM EST by telephone and verified that I am speaking with the correct person using two identifiers.  Location: Patient: home Provider: office   I discussed the limitations, risks, security and privacy concerns of performing an evaluation and management service by telephone and the availability of in person appointments. I also discussed with the patient that there may be a patient responsible charge related to this service. The patient expressed understanding and agreed to proceed.   History of Present Illness:    Observations/Objective:   Assessment and Plan:   Follow Up Instructions:    I discussed the assessment and treatment plan with the patient. The patient was provided an opportunity to ask questions and all were answered. The patient agreed with the plan and demonstrated an understanding of the instructions.   The patient was advised to call back or seek an in-person evaluation if the symptoms worsen or if the condition fails to improve as anticipated.  I provided 17 minutes of non-face-to-face time during this encounter. No cough.  No shortness of breath.  No sore throat.  No fever.  No achiness.  Symptoms primarily focused on the ear.  Vicente Males, LPN  Review of Systems See above    Objective:   Physical Exam  Virtual      Assessment & Plan:  Impression otitis/periauricular lymphadenitis.  Patient notes transient orthostatic symptoms.  With recent illness.  Last just a few seconds.

## 2020-01-02 ENCOUNTER — Other Ambulatory Visit: Payer: Self-pay

## 2020-01-02 ENCOUNTER — Encounter: Payer: Self-pay | Admitting: Orthopedic Surgery

## 2020-01-02 ENCOUNTER — Ambulatory Visit: Payer: PPO

## 2020-01-02 ENCOUNTER — Ambulatory Visit: Payer: PPO | Admitting: Orthopedic Surgery

## 2020-01-02 VITALS — Ht 62.0 in | Wt 185.0 lb

## 2020-01-02 DIAGNOSIS — M1711 Unilateral primary osteoarthritis, right knee: Secondary | ICD-10-CM

## 2020-01-02 DIAGNOSIS — Z96651 Presence of right artificial knee joint: Secondary | ICD-10-CM | POA: Diagnosis not present

## 2020-01-02 NOTE — Patient Instructions (Signed)
Meily everything looks good in terms of your knee replacement.  We do expect some soreness at times related to soft tissues around the joint but the x-rays showed no loosening or wear of the polyethylene bearing of the right knee  Recommend normal activities and return in a year for follow-up films

## 2020-01-02 NOTE — Progress Notes (Signed)
Chief Complaint  Patient presents with  . Routine Post Op    right knee replaced 12/29/17 improving some soreness at times     2-year follow-up on a right total knee patient was having some anterolateral lower thigh and knee pain which subsequently went away.  She is otherwise doing well with her right total knee  She does complain of some right shoulder pain status post right rotator cuff repair and distal clavicle excision about 8 years ago by Dr. Mardelle Matte  Her right knee comes full extension she has flexion past 115 degrees feels stable in all planes  X-ray looks good  Recommend follow-up in a year for repeat x-ray  She will see if she can get Dr. Mardelle Matte to delineate what it is he thought he might have to come back and do which she says he letter know that 30% of the patients have to come back for  Encounter Diagnoses  Name Primary?  . S/P TKR (total knee replacement), right 12/29/17 Yes  . Unilateral primary osteoarthritis, right knee

## 2020-01-13 ENCOUNTER — Emergency Department (HOSPITAL_COMMUNITY)
Admission: EM | Admit: 2020-01-13 | Discharge: 2020-01-13 | Disposition: A | Discharge status: Home / Self Care | Payer: PPO | Attending: Emergency Medicine | Admitting: Emergency Medicine

## 2020-01-13 ENCOUNTER — Encounter (HOSPITAL_COMMUNITY): Payer: Self-pay | Admitting: Emergency Medicine

## 2020-01-13 ENCOUNTER — Other Ambulatory Visit: Payer: Self-pay

## 2020-01-13 ENCOUNTER — Emergency Department (HOSPITAL_COMMUNITY): Payer: PPO

## 2020-01-13 DIAGNOSIS — Z79899 Other long term (current) drug therapy: Secondary | ICD-10-CM | POA: Diagnosis not present

## 2020-01-13 DIAGNOSIS — R079 Chest pain, unspecified: Secondary | ICD-10-CM

## 2020-01-13 DIAGNOSIS — I509 Heart failure, unspecified: Secondary | ICD-10-CM | POA: Insufficient documentation

## 2020-01-13 DIAGNOSIS — I110 Hypertensive heart disease with heart failure: Secondary | ICD-10-CM | POA: Insufficient documentation

## 2020-01-13 DIAGNOSIS — R0789 Other chest pain: Secondary | ICD-10-CM | POA: Diagnosis not present

## 2020-01-13 LAB — BASIC METABOLIC PANEL
Anion gap: 11 (ref 5–15)
BUN: 11 mg/dL (ref 8–23)
CO2: 26 mmol/L (ref 22–32)
Calcium: 8.9 mg/dL (ref 8.9–10.3)
Chloride: 100 mmol/L (ref 98–111)
Creatinine, Ser: 0.81 mg/dL (ref 0.44–1.00)
GFR calc Af Amer: >60 mL/min (ref >60)
GFR calc non Af Amer: >60 mL/min (ref >60)
Glucose, Bld: 120 mg/dL — ABNORMAL HIGH (ref 70–99)
Potassium: 3.2 mmol/L — ABNORMAL LOW (ref 3.5–5.1)
Sodium: 137 mmol/L (ref 135–145)

## 2020-01-13 LAB — CBC WITH DIFFERENTIAL/PLATELET
Abs Immature Granulocytes: 0.01 10*3/uL (ref 0.00–0.07)
Basophils Absolute: 0.1 10*3/uL (ref 0.0–0.1)
Basophils Relative: 1 %
Eosinophils Absolute: 0.2 10*3/uL (ref 0.0–0.5)
Eosinophils Relative: 3 %
HCT: 39.9 % (ref 36.0–46.0)
Hemoglobin: 13.9 g/dL (ref 12.0–15.0)
Immature Granulocytes: 0 %
Lymphocytes Relative: 32 %
Lymphs Abs: 1.9 10*3/uL (ref 0.7–4.0)
MCH: 32.7 pg (ref 26.0–34.0)
MCHC: 34.8 g/dL (ref 30.0–36.0)
MCV: 93.9 fL (ref 80.0–100.0)
Monocytes Absolute: 0.5 10*3/uL (ref 0.1–1.0)
Monocytes Relative: 8 %
Neutro Abs: 3.4 10*3/uL (ref 1.7–7.7)
Neutrophils Relative %: 56 %
Platelets: 227 10*3/uL (ref 150–400)
RBC: 4.25 MIL/uL (ref 3.87–5.11)
RDW: 12.3 % (ref 11.5–15.5)
WBC: 6 10*3/uL (ref 4.0–10.5)
nRBC: 0 % (ref 0.0–0.2)

## 2020-01-13 LAB — D-DIMER, QUANTITATIVE: D-Dimer, Quant: 0.39 ug/mL-FEU (ref 0.00–0.50)

## 2020-01-13 LAB — TROPONIN I (HIGH SENSITIVITY)
Troponin I (High Sensitivity): <2 ng/L (ref <18)
Troponin I (High Sensitivity): <2 ng/L (ref <18)

## 2020-01-13 MED ORDER — SODIUM CHLORIDE 0.9 % IV SOLN: INTRAVENOUS | Status: DC

## 2020-01-13 NOTE — ED Triage Notes (Signed)
Pt states she was in church about 45 minutes ago and began having a stabbing pain in the middle of her back which has now moved to under her left breast and left arm. Felt sob and very tired. Last week had a similar episode much less severe which resolved shortly after onset.

## 2020-01-13 NOTE — ED Provider Notes (Signed)
[facility] Provider Note   CSN: [id number] Arrival date & time: 01/13/20  1531     History Chief Complaint  Patient presents with  . Chest Pain    Emily [name] is a 69 y.o. female.  HPI She presents for evaluation of chest pain that started while she was at church, at 2:30 PM.  The pain was 5 or 10 at the worst, now is almost gone.  The pain started in her left posterior thorax region and radiated to her left axilla and left breast region.  It is now only in the left anterior chest.  With the pain she had a feeling of unsteadiness, nausea and transient shortness of breath.  She had a similar episode 1 week ago but did not get it checked at that time.  She denies fever, chills, cough, persistent nausea and vomiting, weakness or dizziness.  She states she has a history of congestive heart failure that was associated with swelling her legs, but she does not see a cardiologist.  She does not think that she ever had a heart attack.  No known sick contacts.  There are no other known modifying factors.    Past Medical History:  Diagnosis Date  . Acid reflux   . Anxiety   . Arthritis   . CHF (congestive heart failure) (HCC)    90's swelling body-no breathing problems at that time-no cardiac -in dr s Wolfgang [place] office [place] give lasix and potassium  . Complication of anesthesia    History of being told she was allergic to something in 1980 no problems since  . Depression   . Fibromyalgia   . Fibromyalgia 09/10/2019  . History of blood transfusion reaction 1978  . History of hiatal hernia   . HNP (herniated nucleus pulposus)   . Hypertension    Does not see a cardiologist no stress or echo  . Joint pain   . Nerve pain    right leg   . Ruptured lumbar disc   . Sleep apnea    Uses CPAP every night setting at 7 mm/Hg    Patient Active Problem List   Diagnosis Date Noted  . Fibromyalgia 09/10/2019  . S/P TKR (total knee replacement), right 12/29/17 01/10/2018    . Primary osteoarthritis of right knee   . GERD without esophagitis 03/28/2017  . Obesity (BMI 30-39.9) 03/28/2017  . Obesity 10/06/2016  . Lumbar foraminal stenosis 06/04/2016  . Carpal tunnel syndrome, bilateral 02/07/2014  . S/P lumbar spinal fusion 02/07/2014  . Obstructive sleep apnea 02/04/2014  . Hand arthritis 10/15/2013  . HTN (hypertension), benign 08/24/2013  . Hypertriglyceridemia 08/24/2013  . Iliotibial band tendonitis 02/08/2013  . Trigger finger, acquired 02/08/2013  . Low back pain 06/22/2011  . HNP (herniated nucleus pulposus) 06/22/2011  . HIP PAIN, RIGHT 12/15/2010  . LUMBOSACRAL SPONDYLOSIS WITHOUT MYELOPATHY 12/15/2010  . SPINAL STENOSIS, LUMBAR 12/15/2010  . KNEE REPLACEMENT, HX OF 02/26/2010  . DERANGEMENT MENISCUS 07/03/2009  . KNEE, ARTHRITIS, DEGEN./OSTEO 05/22/2009  . KNEE PAIN 05/22/2009  . ANSERINE BURSITIS 05/22/2009  . TRIGGER FINGER 09/23/2008    Past Surgical History:  Procedure Laterality Date  . BACK SURGERY     x3  . BLADDER REPAIR     tear during during birth of child  . CARPAL TUNNEL RELEASE Bilateral   . CESAREAN SECTION    . CHOLECYSTECTOMY     gallbladder disease; stayed sick  . COLONOSCOPY N/A 04/28/2016   Procedure: COLONOSCOPY;  Surgeon: Bernadene Person  Gloriann Loan, MD;  Location: [facility];  Service: Endoscopy;  Laterality: N/A;  . ESOPHAGOGASTRODUODENOSCOPY N/A 04/28/2016   Procedure: ESOPHAGOGASTRODUODENOSCOPY (EGD);  Surgeon: [name], MD;  Location: [facility];  Service: Endoscopy;  Laterality: N/A;  12:00  . GALLBLADDER SURGERY    . knee right (torn Cart)    . REPLACEMENT TOTAL KNEE     left  . ROTATOR CUFF REPAIR Bilateral    right and left  . SPINAL FUSION    . TOTAL KNEE ARTHROPLASTY Right 12/29/2017   Procedure: TOTAL KNEE ARTHROPLASTY-right knee;  Surgeon: [name], MD;  Location: [facility];  Service: Orthopedics;  Laterality: Right;  . TUBAL LIGATION    . vein right leg removed    . wrist ganglion  cyst left       OB History   No obstetric history on file.     Family History  Problem Relation Age of Onset  . Hypertension Father   . Cancer Father        liposarcoma  . Heart disease Mother   . Cancer Other        family history   . Diabetes Other        family history   . Coronary artery disease Other        family history   . Arthritis Other        family history   . Asthma Other   . Diabetes Brother   . Heart disease Brother   . Coronary artery disease Brother 17       CABD    Social History   Tobacco Use  . Smoking status: Never Smoker  . Smokeless tobacco: Never Used  Substance Use Topics  . Alcohol use: No  . Drug use: No    Home Medications Prior to Admission medications   Medication Sig Start Date End Date Taking? Authorizing Provider  acetaminophen (TYLENOL) 500 MG tablet Take 500 mg by mouth every 6 (six) hours as needed. She takes two Q 8 hours prn   Yes [provider]  diazepam (VALIUM) 5 MG tablet Take 5 mg by mouth every 6 (six) hours as needed for muscle spasms.    Yes [provider]  diphenhydrAMINE (BENADRYL ALLERGY) 25 MG tablet Take 1 tablet (25 mg total) by mouth every 4 (four) hours. Take with hydrocodone 12/31/17  Yes [name], MD  DULoxetine (CYMBALTA) 60 MG capsule Take 1 capsule (60 mg total) by mouth daily. 09/10/19  Yes Kathyrn Drown, MD  fexofenadine (ALLEGRA) 180 MG tablet Take 180 mg by mouth daily as needed for allergies or rhinitis.   Yes [provider]  hydrochlorothiazide (HYDRODIURIL) 25 MG tablet Take 1 tablet (25 mg total) by mouth daily. 09/10/19  Yes Kathyrn Drown, MD  Misc. Devices (TOILET SEAT ELEVATOR) MISC Elevated toilet seat if needed 12/31/17  Yes [name], MD  pantoprazole (PROTONIX) 40 MG tablet Take 1 tablet (40 mg total) by mouth daily. 09/10/19  Yes Luking, Elayne Snare, MD  potassium chloride SA (KLOR-CON M20) 20 MEQ tablet Take 1 tablet (20 mEq total) by mouth daily.  09/10/19  Yes Kathyrn Drown, MD  pravastatin (PRAVACHOL) 20 MG tablet Take 1 tablet (20 mg total) by mouth daily. 04/18/19  Yes Luking, Elayne Snare, MD  verapamil (CALAN-SR) 180 MG CR tablet Take 1 tablet (180 mg total) by mouth daily. 09/10/19  Yes Kathyrn Drown, MD  docusate sodium (COLACE) 100  MG capsule Take 1 capsule (100 mg total) by mouth 2 (two) times daily. 12/31/17   [name], MD    Allergies    Codeine, Oxycodone-acetaminophen, Sulfamethoxazole-trimethoprim, Celebrex [celecoxib], Hydrocodone-acetaminophen, Losartan, Lyrica [pregabalin], and Tramadol  Review of Systems   Review of Systems  All other systems reviewed and are negative.   Physical Exam Updated Vital Signs BP (!) 120/59   Pulse 70   Temp 97.8 F (36.6 C) (Oral)   Resp 17   Ht 5\' 2"  (1.575 m)   Wt 81.6 kg   SpO2 94%   BMI 32.92 kg/m   Physical Exam Vitals and nursing note reviewed.  Constitutional:      General: She is not in acute distress.    Appearance: She is well-developed. She is not ill-appearing, toxic-appearing or diaphoretic.  HENT:     Head: Normocephalic and atraumatic.     Right Ear: External ear normal.     Left Ear: External ear normal.  Eyes:     Conjunctiva/sclera: Conjunctivae normal.     Pupils: Pupils are equal, round, and reactive to light.  Neck:     Trachea: Phonation normal.  Cardiovascular:     Rate and Rhythm: Normal rate and regular rhythm.     Heart sounds: Normal heart sounds.  Pulmonary:     Effort: Pulmonary effort is normal.     Breath sounds: Normal breath sounds.  Chest:     Chest wall: No tenderness.  Abdominal:     General: There is no distension.     Palpations: Abdomen is soft.     Tenderness: There is no abdominal tenderness.  Musculoskeletal:        General: No swelling, tenderness or signs of injury. Normal range of motion.     Cervical back: Normal range of motion and neck supple.  Skin:    General: Skin is warm and dry.  Neurological:      Mental Status: She is alert and oriented to person, place, and time.     Cranial Nerves: No cranial nerve deficit.     Sensory: No sensory deficit.     Motor: No abnormal muscle tone.     Coordination: Coordination normal.  Psychiatric:        Mood and Affect: Mood normal.        Behavior: Behavior normal.        Thought Content: Thought content normal.        Judgment: Judgment normal.     ED Results / Procedures / Treatments   Labs (all labs ordered are listed, but only abnormal results are displayed) Labs Reviewed  BASIC METABOLIC PANEL - Abnormal; Notable for the following components:      Result Value   Potassium 3.2 (*)    Glucose, Bld 120 (*)    All other components within normal limits  CBC WITH DIFFERENTIAL/PLATELET  D-DIMER, QUANTITATIVE (NOT AT [facility])  TROPONIN I (HIGH SENSITIVITY)  TROPONIN I (HIGH SENSITIVITY)    EKG EKG Interpretation  Date/Time:  Sunday January 13 2020 15:39:11 EST Ventricular Rate:  72 PR Interval:    QRS Duration: 94 QT Interval:  408 QTC Calculation: 447 R Axis:   -19 Text Interpretation: Sinus rhythm Consider left ventricular hypertrophy Baseline wander in lead(s) II III aVF V1 V3 V4 V6 Since last tracing possible new LVH Otherwise no significant change Confirmed by Daleen Bo [phone]) on 01/13/2020 3:48:22 PM   Radiology DG Chest Port 1 View  Result Date: 01/13/2020 CLINICAL  DATA:  Back and chest pain EXAM: PORTABLE CHEST 1 VIEW COMPARISON:  04/03/2013 FINDINGS: The heart size and mediastinal contours are within normal limits. Both lungs are clear. The visualized skeletal structures are unremarkable. IMPRESSION: No acute abnormality of the lungs in [facility] portable projection. Electronically Signed   By: Eddie Candle M.D.   On: 01/13/2020 16:11    Procedures Procedures (including critical care time)  Medications Ordered in ED Medications - No data to display  ED Course  I have reviewed the triage vital signs and the nursing  notes.  Pertinent labs & imaging results that were available during my care of the patient were reviewed by me and considered in my medical decision making (see chart for details).  Clinical Course as of Jan 12 1854  Sun Jan 13, 2020  1643 Normal  D-dimer, quantitative [EW]  1643 Normal  CBC with Differential [EW]  1643 No infiltrate or CHF, interpreted by me  DG Chest [facility] [EW]  1707 Normal  Troponin I (High Sensitivity) [EW]  1707 Normal except potassium low, glucose high  Basic metabolic panel(!) [EW]  [id number] D-dimer, quantitative [EW]  1841 Normal delta troponin  Troponin I (High Sensitivity) [EW]    Clinical Course User Index [EW] Daleen Bo, MD   MDM Rules/Calculators/A&P                       Patient Vitals for the past 24 hrs:  BP Temp Temp src Pulse Resp SpO2 Height Weight  01/13/20 1800 (!) 120/59 -- -- 70 17 94 % -- --  01/13/20 1745 -- -- -- 71 20 96 % -- --  01/13/20 1730 118/63 -- -- 68 15 95 % -- --  01/13/20 1715 -- -- -- 72 (!) 22 94 % -- --  01/13/20 1700 (!) 115/57 -- -- 69 18 94 % -- --  01/13/20 1645 -- -- -- 69 16 94 % -- --  01/13/20 1630 121/63 -- -- -- 12 -- -- --  01/13/20 1615 -- -- -- 72 17 93 % -- --  01/13/20 1600 127/81 -- -- 69 20 95 % -- --  01/13/20 1543 (!) 150/77 97.8 F (36.6 C) Oral 76 18 96 % -- --  01/13/20 1542 -- -- -- -- -- -- 5\' 2"  (1.575 m) 81.6 kg    6:52 PM Reevaluation with update and discussion. After initial assessment and treatment, an updated evaluation reveals she states her chest pain has resolved and she has no further complaints, findings discussed and questions answered. Daleen Bo   Medical Decision Making: Nonspecific chest pain with reassuring evaluation.  Chest pain is atypical for coronary disease, and her evaluation here does not show any acute abnormalities.  Doubt ACS, PE or pneumonia.  She reports history of congestive heart failure however no cardiac echo or EMR records for same are found.  She  is stable for discharge with outpatient follow-up.  SKYLLAR TORTORICE was evaluated in Emergency Department on 01/13/2020 for the symptoms described in the history of present illness. She was evaluated in the context of the global COVID-19 pandemic, which necessitated consideration that the patient might be at risk for infection with the SARS-CoV-2 virus that causes COVID-19. Institutional protocols and algorithms that pertain to the evaluation of patients at risk for COVID-19 are in a state of rapid change based on information released by regulatory bodies including the CDC and federal and state organizations. These policies and algorithms were followed  during the patient's care in the ED.  CRITICAL CARE-no Performed by: Daleen Bo   Nursing Notes Reviewed/ Care Coordinated Applicable Imaging Reviewed Interpretation of Laboratory Data incorporated into ED treatment  The patient appears reasonably screened and/or stabilized for discharge and I doubt any other medical condition or other [facility] requiring further screening, evaluation, or treatment in the ED at this time prior to discharge.  Plan: Home Medications-continue usual; Home Treatments-rest, gradual advance activity; return here if the recommended treatment, does not improve the symptoms; Recommended follow up-PCP, as needed.  Referred to cardiology for further evaluation of chest discomfort.    Final Clinical Impression(s) / ED Diagnoses Final diagnoses:  Nonspecific chest pain    Rx / DC Orders ED Discharge Orders    None       Daleen Bo, MD 01/13/20 [phone]

## 2020-01-13 NOTE — Discharge Instructions (Addendum)
Testing today did not show any serious problems.  We are referring you to the cardiology service for an evaluation.  They have an office here in [place] where they should be able to see you.  Please call them for an appointment.  Return here, if needed for problems.

## 2020-01-18 ENCOUNTER — Ambulatory Visit: Payer: PPO | Admitting: Internal Medicine

## 2020-01-18 ENCOUNTER — Encounter: Payer: Self-pay | Admitting: Internal Medicine

## 2020-01-18 ENCOUNTER — Other Ambulatory Visit: Payer: Self-pay

## 2020-01-18 ENCOUNTER — Encounter (HOSPITAL_COMMUNITY): Payer: PPO | Attending: Family Medicine

## 2020-01-18 VITALS — BP 148/84 | HR 67 | Temp 96.8°F | Ht 62.0 in | Wt 183.0 lb

## 2020-01-18 DIAGNOSIS — I10 Essential (primary) hypertension: Secondary | ICD-10-CM | POA: Diagnosis not present

## 2020-01-18 DIAGNOSIS — Z01812 Encounter for preprocedural laboratory examination: Secondary | ICD-10-CM | POA: Diagnosis not present

## 2020-01-18 DIAGNOSIS — R079 Chest pain, unspecified: Secondary | ICD-10-CM

## 2020-01-18 DIAGNOSIS — Z20822 Contact with and (suspected) exposure to covid-19: Secondary | ICD-10-CM | POA: Insufficient documentation

## 2020-01-18 LAB — SARS CORONAVIRUS 2 (TAT 6-24 HRS): SARS Coronavirus 2: NEGATIVE

## 2020-01-18 MED ORDER — NITROGLYCERIN 0.4 MG SL SUBL: 0.4000 mg | SUBLINGUAL_TABLET | SUBLINGUAL | 3 refills | Status: DC | PRN

## 2020-01-18 NOTE — Patient Instructions (Signed)
.     [address] [place] [address] Dept: [phone] Loc: [place]  01/18/2020  You are scheduled for a Cardiac Catheterization on Wednesday, February 17 with Dr. Shelva Majestic.  1. Please arrive at the [facility] (Main Entrance A) at [facility]: [address] [place], [place] [address] at 8:00 AM (This time is two hours before your procedure to ensure your preparation). Free valet parking service is available.   Special note: Every effort is made to have your procedure done on time. Please understand that emergencies sometimes delay scheduled procedures.  2. Diet: Do not eat solid foods after midnight.  The patient may have clear liquids until 5am upon the day of the procedure.  3. Labs: Done on 01/11/2020 Go for COVID testing NOW  4. Medication instructions in preparation for your procedure:   Contrast Allergy: No    HOLD HCTZ the morning of cath      On the morning of your procedure, take your Aspirin and any morning medicines NOT listed above.  You may use sips of water.  5. Plan for one night stay--bring personal belongings. 6. Bring a current list of your medications and current insurance cards. 7. You MUST have a responsible person to drive you home. 8. Someone MUST be with you the first 24 hours after you arrive home or your discharge will be delayed. 9. Please wear clothes that are easy to get on and off and wear slip-on shoes.  Thank you for allowing [place] to care for you!   -- [facility]      Follow up 3 weeks after cath with Dr.Ross

## 2020-01-18 NOTE — H&P (View-Only) (Signed)
Cardiology Office Note   Date:  01/18/2020   ID:  Emily [name], DOB 1951-07-02, MRN [id number]  PCP:  Kathyrn Drown, MD  Cardiologist:   Dorris Carnes, MD   Pt referred for CP    History of Present Illness: Emily [name] is a 69 y.o. female with a history of CP  She was in church on 01/13/20   Developed pain 5/10 in severity   Started posterior(back) and up to front of chest then to L axilla and L breast.  Some transient SOB and N/  Had a spell one week prior     Since ED visity she had a spell on Monday that lasted a few hours   4/10 in intensity   Frontal pain .   Yesterday she had some discomfort for about 1 hour.  Took tylenol  The pt says over the past 8 months she has noticed incresased DOE   What she could do with walking she has to stop now or slow down due to SOB    FHx signif for mother who died of MI and brother who has stents       Current Meds  Medication Sig  . acetaminophen (TYLENOL) 500 MG tablet Take 500 mg by mouth every 6 (six) hours as needed. She takes two Q 8 hours prn  . diazepam (VALIUM) 5 MG tablet Take 5 mg by mouth every 6 (six) hours as needed for muscle spasms.   . diphenhydrAMINE (BENADRYL ALLERGY) 25 MG tablet Take 1 tablet (25 mg total) by mouth every 4 (four) hours. Take with hydrocodone  . docusate sodium (COLACE) 100 MG capsule Take 1 capsule (100 mg total) by mouth 2 (two) times daily.  . DULoxetine (CYMBALTA) 60 MG capsule Take 1 capsule (60 mg total) by mouth daily.  . fexofenadine (ALLEGRA) 180 MG tablet Take 180 mg by mouth daily as needed for allergies or rhinitis.  . hydrochlorothiazide (HYDRODIURIL) 25 MG tablet Take 1 tablet (25 mg total) by mouth daily.  . Misc. Devices (TOILET SEAT ELEVATOR) MISC Elevated toilet seat if needed  . pantoprazole (PROTONIX) 40 MG tablet Take 1 tablet (40 mg total) by mouth daily.  . potassium chloride SA (KLOR-CON M20) 20 MEQ tablet Take 1 tablet (20 mEq total) by mouth daily.  . pravastatin  (PRAVACHOL) 20 MG tablet Take 1 tablet (20 mg total) by mouth daily.  . verapamil (CALAN-SR) 180 MG CR tablet Take 1 tablet (180 mg total) by mouth daily.     Allergies:   Codeine, Oxycodone-acetaminophen, Sulfamethoxazole-trimethoprim, Celebrex [celecoxib], Hydrocodone-acetaminophen, Losartan, Lyrica [pregabalin], and Tramadol   Past Medical History:  Diagnosis Date  . Acid reflux   . Anxiety   . Arthritis   . CHF (congestive heart failure) (HCC)    90's swelling body-no breathing problems at that time-no cardiac -in dr s Wolfgang [place] office [facility] give lasix and potassium  . Complication of anesthesia    History of being told she was allergic to something in 1980 no problems since  . Depression   . Fibromyalgia   . Fibromyalgia 09/10/2019  . History of blood transfusion reaction 1978  . History of hiatal hernia   . HNP (herniated nucleus pulposus)   . Hypertension    Does not see a cardiologist no stress or echo  . Joint pain   . Nerve pain    right leg   . Ruptured lumbar disc   . Sleep apnea    Uses CPAP every night  setting at 7 mm/Hg    Past Surgical History:  Procedure Laterality Date  . BACK SURGERY     x3  . BLADDER REPAIR     tear during during birth of child  . CARPAL TUNNEL RELEASE Bilateral   . CESAREAN SECTION    . CHOLECYSTECTOMY     gallbladder disease; stayed sick  . COLONOSCOPY N/A 04/28/2016   Procedure: COLONOSCOPY;  Surgeon: [name], MD;  [facility];  Service: Endoscopy;  Laterality: N/A;  . ESOPHAGOGASTRODUODENOSCOPY N/A 04/28/2016   Procedure: ESOPHAGOGASTRODUODENOSCOPY (EGD);  Surgeon: [name], MD;  [facility]: [facility];  Service: Endoscopy;  Laterality: N/A;  12:00  . GALLBLADDER SURGERY    . knee right (torn Cart)    . REPLACEMENT TOTAL KNEE     left  . ROTATOR CUFF REPAIR Bilateral    right and left  . SPINAL FUSION    . TOTAL KNEE ARTHROPLASTY Right 12/29/2017   Procedure: TOTAL KNEE ARTHROPLASTY-right knee;   Surgeon: [name], MD;  Location: [facility];  Service: Orthopedics;  Laterality: Right;  . TUBAL LIGATION    . vein right leg removed    . wrist ganglion cyst left       Social History:  The patient  reports that she has never smoked. She has never used smokeless tobacco. She reports that she does not drink alcohol or use drugs.   Family History:  The patient's family history includes Arthritis in an other family member; Asthma in an other family member; Cancer in her father and another family member; Coronary artery disease in an other family member; Coronary artery disease (age of onset: 24) in her brother; Diabetes in her brother and another family member; Heart disease in her brother and mother; Hypertension in her father.    ROS:  Please see the history of present illness. All other systems are reviewed and  Negative to the above problem except as noted.    PHYSICAL EXAM: VS:  BP (!) 148/84   Pulse 67   Temp (!) 96.8 F (36 C)   Ht 5\' 2"  (1.575 m)   Wt 183 lb (83 kg)   SpO2 99%   BMI 33.47 kg/m   GEN: OBese 69 yo in no acute distress  HEENT: normal  Neck: no JVD, carotid bruits, or masses Cardiac: RRR; no murmurs, rubs, or gallops,no LE edema  Respiratory:  clear to auscultation bilaterally, normal work of breathing GI: soft, nontender, nondistended, + BS  No hepatomegaly  MS: no deformity Moving all extremities   Skin: warm and dry, no rash Neuro:  Strength and sensation are intact Psych: euthymic mood, full affect   EKG:  EKG is not  ordered today.  SR 72 bpm   Borderline for LVH   Lipid Panel    Component Value Date/Time   CHOL 164 09/27/2019 0900   TRIG 355 (H) 09/27/2019 0900   HDL 37 (L) 09/27/2019 0900   CHOLHDL 4.4 09/27/2019 0900   CHOLHDL 4.5 08/20/2013 0741   VLDL 59 (H) 08/20/2013 0741   LDLCALC 71 09/27/2019 0900      Wt Readings from Last 3 Encounters:  01/18/20 183 lb (83 kg)  01/13/20 180 lb (81.6 kg)  01/02/20 185 lb (83.9 kg)        ASSESSMENT AND PLAN:  1   Chst pain   Pt with several episodes of discomfort in chest over past few wks   Some features are atypcial  She denies reflux   No problems with swallowing  Not pleuritic    COncering is her incresased DOE WIth this and FHX I think it is improtant to eval for CAD   Discussed options.  (CT vs CATH)  Risks / benefits for each    WOudl recom R and L heart cath to define anatomy and pressues.   Pt understands and agrees to proceed. For now would recomm she take activity as tolerated  I would recomm ASA 81 mg   ALso Rx given for NTG (SL)    If symptoms worsen come back to ED  2  HTN   BP is a little up   WIll need to follow  3  HL  Currently on pravastatin   WIll need tofllow  Last lipids in Oct 2020 LDL 71  HDL 37  Trg 355   Watch carbs)  F/U based on test results.     Current medicines are reviewed at length with the patient today.  The patient does not have concerns regarding medicines.  Signed, Dorris Carnes, MD  01/18/2020 1:50 PM    [facility] [address], [place], [place]  [address] Phone: [phone]; Fax: [phone]

## 2020-01-18 NOTE — Progress Notes (Signed)
Cardiology Office Note   Date:  01/18/2020   ID:  Emily [name], DOB [birth year], MRN [id number]  PCP:  Kathyrn Drown, MD  Cardiologist:   Dorris Carnes, MD   Pt referred for CP    History of Present Illness: Emily [name] is a 69 y.o. female with a history of CP  She was in church on 01/13/20   Developed pain 5/10 in severity   Started posterior(back) and up to front of chest then to L axilla and L breast.  Some transient SOB and N/  Had a spell one week prior     Since ED visity she had a spell on Monday that lasted a few hours   4/10 in intensity   Frontal pain .   Yesterday she had some discomfort for about 1 hour.  Took tylenol  The pt says over the past 8 months she has noticed incresased DOE   What she could do with walking she has to stop now or slow down due to SOB    FHx signif for mother who died of MI and brother who has stents       Current Meds  Medication Sig  . acetaminophen (TYLENOL) 500 MG tablet Take 500 mg by mouth every 6 (six) hours as needed. She takes two Q 8 hours prn  . diazepam (VALIUM) 5 MG tablet Take 5 mg by mouth every 6 (six) hours as needed for muscle spasms.   . diphenhydrAMINE (BENADRYL ALLERGY) 25 MG tablet Take 1 tablet (25 mg total) by mouth every 4 (four) hours. Take with hydrocodone  . docusate sodium (COLACE) 100 MG capsule Take 1 capsule (100 mg total) by mouth 2 (two) times daily.  . DULoxetine (CYMBALTA) 60 MG capsule Take 1 capsule (60 mg total) by mouth daily.  . fexofenadine (ALLEGRA) 180 MG tablet Take 180 mg by mouth daily as needed for allergies or rhinitis.  . hydrochlorothiazide (HYDRODIURIL) 25 MG tablet Take 1 tablet (25 mg total) by mouth daily.  . Misc. Devices (TOILET SEAT ELEVATOR) MISC Elevated toilet seat if needed  . pantoprazole (PROTONIX) 40 MG tablet Take 1 tablet (40 mg total) by mouth daily.  . potassium chloride SA (KLOR-CON M20) 20 MEQ tablet Take 1 tablet (20 mEq total) by mouth daily.  . pravastatin  (PRAVACHOL) 20 MG tablet Take 1 tablet (20 mg total) by mouth daily.  . verapamil (CALAN-SR) 180 MG CR tablet Take 1 tablet (180 mg total) by mouth daily.     Allergies:   Codeine, Oxycodone-acetaminophen, Sulfamethoxazole-trimethoprim, Celebrex [celecoxib], Hydrocodone-acetaminophen, Losartan, Lyrica [pregabalin], and Tramadol   Past Medical History:  Diagnosis Date  . Acid reflux   . Anxiety   . Arthritis   . CHF (congestive heart failure) (HCC)    90's swelling body-no breathing problems at that time-no cardiac -in dr s Wolfgang [place] office [place] give lasix and potassium  . Complication of anesthesia    History of being told she was allergic to something in 1980 no problems since  . Depression   . Fibromyalgia   . Fibromyalgia 09/10/2019  . History of blood transfusion reaction 1978  . History of hiatal hernia   . HNP (herniated nucleus pulposus)   . Hypertension    Does not see a cardiologist no stress or echo  . Joint pain   . Nerve pain    right leg   . Ruptured lumbar disc   . Sleep apnea    Uses CPAP every night  setting at 7 mm/Hg    Past Surgical History:  Procedure Laterality Date  . BACK SURGERY     x3  . BLADDER REPAIR     tear during during birth of child  . CARPAL TUNNEL RELEASE Bilateral   . CESAREAN SECTION    . CHOLECYSTECTOMY     gallbladder disease; stayed sick  . COLONOSCOPY N/A 04/28/2016   Procedure: COLONOSCOPY;  Surgeon: [name], MD;  [facility];  Service: Endoscopy;  Laterality: N/A;  . ESOPHAGOGASTRODUODENOSCOPY N/A 04/28/2016   Procedure: ESOPHAGOGASTRODUODENOSCOPY (EGD);  Surgeon: [name], MD;  [facility];  Service: Endoscopy;  Laterality: N/A;  12:00  . GALLBLADDER SURGERY    . knee right (torn Cart)    . REPLACEMENT TOTAL KNEE     left  . ROTATOR CUFF REPAIR Bilateral    right and left  . SPINAL FUSION    . TOTAL KNEE ARTHROPLASTY Right 12/29/2017   Procedure: TOTAL KNEE ARTHROPLASTY-right knee;   Surgeon: [name], MD;  Location: [facility];  Service: Orthopedics;  Laterality: Right;  . TUBAL LIGATION    . vein right leg removed    . wrist ganglion cyst left       Social History:  The patient  reports that she has never smoked. She has never used smokeless tobacco. She reports that she does not drink alcohol or use drugs.   Family History:  The patient's family history includes Arthritis in an other family member; Asthma in an other family member; Cancer in her father and another family member; Coronary artery disease in an other family member; Coronary artery disease (age of onset: 57) in her brother; Diabetes in her brother and another family member; Heart disease in her brother and mother; Hypertension in her father.    ROS:  Please see the history of present illness. All other systems are reviewed and  Negative to the above problem except as noted.    PHYSICAL EXAM: VS:  BP (!) 148/84   Pulse 67   Temp (!) 96.8 F (36 C)   Ht 5\' 2"  (1.575 m)   Wt 183 lb (83 kg)   SpO2 99%   BMI 33.47 kg/m   GEN: OBese 69 yo in no acute distress  HEENT: normal  Neck: no JVD, carotid bruits, or masses Cardiac: RRR; no murmurs, rubs, or gallops,no LE edema  Respiratory:  clear to auscultation bilaterally, normal work of breathing GI: soft, nontender, nondistended, + BS  No hepatomegaly  MS: no deformity Moving all extremities   Skin: warm and dry, no rash Neuro:  Strength and sensation are intact Psych: euthymic mood, full affect   EKG:  EKG is not  ordered today.  SR 72 bpm   Borderline for LVH   Lipid Panel    Component Value Date/Time   CHOL 164 09/27/2019 0900   TRIG 355 (H) 09/27/2019 0900   HDL 37 (L) 09/27/2019 0900   CHOLHDL 4.4 09/27/2019 0900   CHOLHDL 4.5 08/20/2013 0741   VLDL 59 (H) 08/20/2013 0741   LDLCALC 71 09/27/2019 0900      Wt Readings from Last 3 Encounters:  01/18/20 183 lb (83 kg)  01/13/20 180 lb (81.6 kg)  01/02/20 185 lb (83.9 kg)        ASSESSMENT AND PLAN:  1   Chst pain   Pt with several episodes of discomfort in chest over past few wks   Some features are atypcial  She denies reflux   No problems with swallowing  Not pleuritic    COncering is her incresased DOE WIth this and FHX I think it is improtant to eval for CAD   Discussed options.  (CT vs CATH)  Risks / benefits for each    WOudl recom R and L heart cath to define anatomy and pressues.   Pt understands and agrees to proceed. For now would recomm she take activity as tolerated  I would recomm ASA 81 mg   ALso Rx given for NTG (SL)    If symptoms worsen come back to ED  2  HTN   BP is a little up   WIll need to follow  3  HL  Currently on pravastatin   WIll need tofllow  Last lipids in Oct 2020 LDL 71  HDL 37  Trg 355   Watch carbs)  F/U based on test results.     Current medicines are reviewed at length with the patient today.  The patient does not have concerns regarding medicines.  Signed, Dorris Carnes, MD  01/18/2020 1:50 PM    [facility] [address], [place], [place]  [address] Phone: [phone]; Fax: [phone]

## 2020-01-21 ENCOUNTER — Telehealth: Payer: Self-pay | Admitting: *Deleted

## 2020-01-21 NOTE — Telephone Encounter (Signed)
Pt contacted pre-catheterization scheduled at [facility] for: Wednesday January 23, 2020 10 AM Verified arrival time and place: [facility]) at: 8 AM   No solid food after midnight prior to cath, clear liquids until 5 AM day of procedure. Contrast allergy: no _________ Copied from staff message 01/21/20 per Dr Harrington Challenger: Have there take extra 20 mEq KCL today   Take 20 Meq tomorrow  Stop HCTZ  Hold til after cath  ________  Except hold medications AM meds can be  taken pre-cath with sip of water including: ASA 81 mg   Confirmed patient has responsible adult to drive home post procedure and observe 24 hours after arriving home: yes  Currently, due to Covid-19 pandemic, only one person will be allowed with patient. Must be the same person for patient's entire stay and will be required to wear a mask. They will be asked to wait in the waiting room for the duration of the patient's stay.  Patients are required to wear a mask when they enter the hospital.     COVID-19 Pre-Screening Questions:  . In the past 7 to 10 days have you had a cough,  shortness of breath, headache, congestion, fever (100 or greater) body aches, chills, sore throat, or sudden loss of taste or sense of smell? no . Have you been around anyone with known Covid 19 in the past 7-10 days?no . Have you been around anyone who is awaiting Covid 19 test results in the past 7 to 10 days? no . Have you been around anyone who has been exposed to Covid 19, or has mentioned symptoms of Covid 19 within the past 7 to 10 days? No  I reviewed procedure/mask/visitor instructions,  COVID-19 screening questions with patient, she verbalized understanding, thanked me for call.

## 2020-01-23 ENCOUNTER — Other Ambulatory Visit: Payer: Self-pay

## 2020-01-23 ENCOUNTER — Encounter (HOSPITAL_COMMUNITY)
Admission: RE | Disposition: A | Discharge status: Home / Self Care | Payer: Self-pay | Source: Home / Self Care | Attending: Cardiovascular Disease

## 2020-01-23 ENCOUNTER — Ambulatory Visit (HOSPITAL_COMMUNITY)
Admission: RE | Admit: 2020-01-23 | Discharge: 2020-01-23 | Disposition: A | Discharge status: Home / Self Care | Payer: PPO | Attending: Cardiovascular Disease | Admitting: Cardiovascular Disease

## 2020-01-23 DIAGNOSIS — M797 Fibromyalgia: Secondary | ICD-10-CM | POA: Diagnosis not present

## 2020-01-23 DIAGNOSIS — R0602 Shortness of breath: Secondary | ICD-10-CM | POA: Insufficient documentation

## 2020-01-23 DIAGNOSIS — I110 Hypertensive heart disease with heart failure: Secondary | ICD-10-CM | POA: Diagnosis not present

## 2020-01-23 DIAGNOSIS — G4730 Sleep apnea, unspecified: Secondary | ICD-10-CM | POA: Diagnosis not present

## 2020-01-23 DIAGNOSIS — R0600 Dyspnea, unspecified: Secondary | ICD-10-CM

## 2020-01-23 DIAGNOSIS — Z8249 Family history of ischemic heart disease and other diseases of the circulatory system: Secondary | ICD-10-CM | POA: Diagnosis not present

## 2020-01-23 DIAGNOSIS — K219 Gastro-esophageal reflux disease without esophagitis: Secondary | ICD-10-CM | POA: Diagnosis not present

## 2020-01-23 DIAGNOSIS — R079 Chest pain, unspecified: Secondary | ICD-10-CM | POA: Insufficient documentation

## 2020-01-23 DIAGNOSIS — I509 Heart failure, unspecified: Secondary | ICD-10-CM | POA: Diagnosis not present

## 2020-01-23 DIAGNOSIS — M1990 Unspecified osteoarthritis, unspecified site: Secondary | ICD-10-CM | POA: Insufficient documentation

## 2020-01-23 DIAGNOSIS — Z885 Allergy status to narcotic agent status: Secondary | ICD-10-CM | POA: Insufficient documentation

## 2020-01-23 DIAGNOSIS — Z888 Allergy status to other drugs, medicaments and biological substances status: Secondary | ICD-10-CM | POA: Diagnosis not present

## 2020-01-23 DIAGNOSIS — F419 Anxiety disorder, unspecified: Secondary | ICD-10-CM | POA: Diagnosis not present

## 2020-01-23 DIAGNOSIS — Z881 Allergy status to other antibiotic agents status: Secondary | ICD-10-CM | POA: Diagnosis not present

## 2020-01-23 DIAGNOSIS — Z886 Allergy status to analgesic agent status: Secondary | ICD-10-CM | POA: Diagnosis not present

## 2020-01-23 DIAGNOSIS — Z79899 Other long term (current) drug therapy: Secondary | ICD-10-CM | POA: Insufficient documentation

## 2020-01-23 DIAGNOSIS — F329 Major depressive disorder, single episode, unspecified: Secondary | ICD-10-CM | POA: Insufficient documentation

## 2020-01-23 DIAGNOSIS — Z96651 Presence of right artificial knee joint: Secondary | ICD-10-CM | POA: Insufficient documentation

## 2020-01-23 DIAGNOSIS — Z20822 Contact with and (suspected) exposure to covid-19: Secondary | ICD-10-CM | POA: Diagnosis not present

## 2020-01-23 DIAGNOSIS — R0609 Other forms of dyspnea: Secondary | ICD-10-CM

## 2020-01-23 HISTORY — PX: RIGHT/LEFT HEART CATH AND CORONARY ANGIOGRAPHY: CATH118266

## 2020-01-23 LAB — POCT I-STAT EG7
Bicarbonate: 25.4 mmol/L (ref 20.0–28.0)
Calcium, Ion: 1.24 mmol/L (ref 1.15–1.40)
HCT: 36 % (ref 36.0–46.0)
Hemoglobin: 12.2 g/dL (ref 12.0–15.0)
O2 Saturation: 75 %
Potassium: 3.8 mmol/L (ref 3.5–5.1)
Sodium: 141 mmol/L (ref 135–145)
TCO2: 27 mmol/L (ref 22–32)
pCO2, Ven: 45.8 mmHg (ref 44.0–60.0)
pH, Ven: 7.353 (ref 7.250–7.430)
pO2, Ven: 42 mmHg (ref 32.0–45.0)

## 2020-01-23 LAB — POCT I-STAT 7, (LYTES, BLD GAS, ICA,H+H)
Acid-base deficit: 8 mmol/L — ABNORMAL HIGH (ref 0.0–2.0)
Bicarbonate: 19.7 mmol/L — ABNORMAL LOW (ref 20.0–28.0)
Calcium, Ion: 1.11 mmol/L — ABNORMAL LOW (ref 1.15–1.40)
HCT: 37 % (ref 36.0–46.0)
Hemoglobin: 12.6 g/dL (ref 12.0–15.0)
O2 Saturation: 94 %
Potassium: 3.2 mmol/L — ABNORMAL LOW (ref 3.5–5.1)
Sodium: 118 mmol/L — CL (ref 135–145)
TCO2: 21 mmol/L — ABNORMAL LOW (ref 22–32)
pCO2 arterial: 47.3 mmHg (ref 32.0–48.0)
pH, Arterial: 7.228 — ABNORMAL LOW (ref 7.350–7.450)
pO2, Arterial: 85 mmHg (ref 83.0–108.0)

## 2020-01-23 LAB — BASIC METABOLIC PANEL
Anion gap: 13 (ref 5–15)
BUN: 10 mg/dL (ref 8–23)
CO2: 26 mmol/L (ref 22–32)
Calcium: 9.5 mg/dL (ref 8.9–10.3)
Chloride: 103 mmol/L (ref 98–111)
Creatinine, Ser: 0.77 mg/dL (ref 0.44–1.00)
GFR calc Af Amer: >60 mL/min (ref >60)
GFR calc non Af Amer: >60 mL/min (ref >60)
Glucose, Bld: 109 mg/dL — ABNORMAL HIGH (ref 70–99)
Potassium: 3.7 mmol/L (ref 3.5–5.1)
Sodium: 142 mmol/L (ref 135–145)

## 2020-01-23 SURGERY — RIGHT/LEFT HEART CATH AND CORONARY ANGIOGRAPHY
Anesthesia: LOCAL

## 2020-01-23 MED ORDER — MIDAZOLAM HCL 2 MG/2ML IJ SOLN
INTRAMUSCULAR | Status: DC | PRN
  Administered 2020-01-23: 2 mg via INTRAVENOUS
  Administered 2020-01-23: 1 mg via INTRAVENOUS

## 2020-01-23 MED ORDER — MIDAZOLAM HCL 2 MG/2ML IJ SOLN
INTRAMUSCULAR | Status: AC
  Filled 2020-01-23: qty 2

## 2020-01-23 MED ORDER — SODIUM CHLORIDE 0.9 % WEIGHT BASED INFUSION: 1.0000 mL/kg/h | INTRAVENOUS | Status: DC

## 2020-01-23 MED ORDER — HEPARIN (PORCINE) IN NACL 1000-0.9 UT/500ML-% IV SOLN
INTRAVENOUS | Status: DC | PRN
  Administered 2020-01-23: 500 mL

## 2020-01-23 MED ORDER — LIDOCAINE HCL (PF) 1 % IJ SOLN
INTRAMUSCULAR | Status: DC | PRN
  Administered 2020-01-23: 1 mL
  Administered 2020-01-23: 2 mL

## 2020-01-23 MED ORDER — ASPIRIN 81 MG PO CHEW: 81.0000 mg | CHEWABLE_TABLET | ORAL | Status: DC

## 2020-01-23 MED ORDER — HEPARIN (PORCINE) IN NACL 1000-0.9 UT/500ML-% IV SOLN
INTRAVENOUS | Status: AC
  Filled 2020-01-23: qty 1000

## 2020-01-23 MED ORDER — LIDOCAINE HCL (PF) 1 % IJ SOLN
INTRAMUSCULAR | Status: AC
  Filled 2020-01-23: qty 30

## 2020-01-23 MED ORDER — SODIUM CHLORIDE 0.9% FLUSH: 3.0000 mL | Freq: Two times a day (BID) | INTRAVENOUS | Status: DC

## 2020-01-23 MED ORDER — SODIUM CHLORIDE 0.9 % IV SOLN: 250.0000 mL | INTRAVENOUS | Status: DC | PRN

## 2020-01-23 MED ORDER — VERAPAMIL HCL 2.5 MG/ML IV SOLN
INTRAVENOUS | Status: DC | PRN
  Administered 2020-01-23: 10:00:00 10 mL via INTRA_ARTERIAL

## 2020-01-23 MED ORDER — VERAPAMIL HCL 2.5 MG/ML IV SOLN
INTRAVENOUS | Status: AC
  Filled 2020-01-23: qty 2

## 2020-01-23 MED ORDER — FENTANYL CITRATE (PF) 100 MCG/2ML IJ SOLN
INTRAMUSCULAR | Status: AC
  Filled 2020-01-23: qty 2

## 2020-01-23 MED ORDER — HEPARIN SODIUM (PORCINE) 1000 UNIT/ML IJ SOLN
INTRAMUSCULAR | Status: DC | PRN
  Administered 2020-01-23: 4000 [IU] via INTRAVENOUS

## 2020-01-23 MED ORDER — FENTANYL CITRATE (PF) 100 MCG/2ML IJ SOLN
INTRAMUSCULAR | Status: DC | PRN
  Administered 2020-01-23 (×2): 25 ug via INTRAVENOUS

## 2020-01-23 MED ORDER — HEPARIN SODIUM (PORCINE) 1000 UNIT/ML IJ SOLN
INTRAMUSCULAR | Status: AC
  Filled 2020-01-23: qty 1

## 2020-01-23 MED ORDER — IOHEXOL 350 MG/ML SOLN
INTRAVENOUS | Status: DC | PRN
  Administered 2020-01-23: 70 mL

## 2020-01-23 MED ORDER — SODIUM CHLORIDE 0.9 % WEIGHT BASED INFUSION
3.0000 mL/kg/h | INTRAVENOUS | Status: AC
  Administered 2020-01-23: 3 mL/kg/h via INTRAVENOUS

## 2020-01-23 MED ORDER — SODIUM CHLORIDE 0.9% FLUSH: 3.0000 mL | INTRAVENOUS | Status: DC | PRN

## 2020-01-23 SURGICAL SUPPLY — 13 items
CATH BALLN WEDGE 5F 110CM (CATHETERS) ×1 IMPLANT
CATH INFINITI 5FR ANG PIGTAIL (CATHETERS) ×1 IMPLANT
CATH OPTITORQUE TIG 4.0 5F (CATHETERS) ×1 IMPLANT
DEVICE RAD COMP TR BAND LRG (VASCULAR PRODUCTS) ×1 IMPLANT
GLIDESHEATH SLEND SS 6F .021 (SHEATH) ×1 IMPLANT
GUIDEWIRE INQWIRE 1.5J.035X260 (WIRE) IMPLANT
INQWIRE 1.5J .035X260CM (WIRE) ×2
KIT HEART LEFT (KITS) ×2 IMPLANT
PACK CARDIAC CATHETERIZATION (CUSTOM PROCEDURE TRAY) ×2 IMPLANT
SHEATH GLIDE SLENDER 4/5FR (SHEATH) ×1 IMPLANT
SHEATH PROBE COVER 6X72 (BAG) ×1 IMPLANT
TRANSDUCER W/STOPCOCK (MISCELLANEOUS) ×2 IMPLANT
TUBING CIL FLEX 10 FLL-RA (TUBING) ×2 IMPLANT

## 2020-01-23 NOTE — Interval H&P Note (Signed)
Cath Lab Visit (complete for each Cath Lab visit)  Clinical Evaluation Leading to the Procedure:   ACS: No.  Non-ACS:    Anginal Classification: CCS II  Anti-ischemic medical therapy: Minimal Therapy (1 class of medications)  Non-Invasive Test Results: No non-invasive testing performed  Prior CABG: No previous CABG      History and Physical Interval Note:  01/23/2020 9:49 AM  [name]  has presented today for surgery, with the diagnosis of CHEST PAIN.  The various methods of treatment have been discussed with the patient and family. After consideration of risks, benefits and other options for treatment, the patient has consented to  Procedure(s): RIGHT/LEFT HEART CATH AND CORONARY ANGIOGRAPHY (N/A) as a surgical intervention.  The patient's history has been reviewed, patient examined, no change in status, stable for surgery.  I have reviewed the patient's chart and labs.  Questions were answered to the patient's satisfaction.     Shelva Majestic

## 2020-01-23 NOTE — Research (Signed)
PHDE Informed Consent   Subject Name: Emily [name]  Subject met inclusion and exclusion criteria.  The informed consent form, study requirements and expectations were reviewed with the subject and questions and concerns were addressed prior to the signing of the consent form.  The subject verbalized understanding of the trail requirements.  The subject agreed to participate in the PHDE trial and signed the informed consent.  The informed consent was obtained prior to performance of any protocol-specific procedures for the subject.  A copy of the signed informed consent was given to the subject and a copy was placed in the subject's medical record.  [name] 01/23/2020, 8:44 AM

## 2020-01-23 NOTE — Discharge Instructions (Signed)
Radial Site Care  This sheet gives you information about how to care for yourself after your procedure. Your health care provider may also give you more specific instructions. If you have problems or questions, contact your health care provider. What can I expect after the procedure? After the procedure, it is common to have:  Bruising and tenderness at the catheter insertion area. Follow these instructions at home: Medicines  Take over-the-counter and prescription medicines only as told by your health care provider. Insertion site care  Follow instructions from your health care provider about how to take care of your insertion site. Make sure you: ? Wash your hands with soap and water before you change your bandage (dressing). If soap and water are not available, use hand sanitizer. ? Change your dressing as told by your health care provider. ? Leave stitches (sutures), skin glue, or adhesive strips in place. These skin closures may need to stay in place for 2 weeks or longer. If adhesive strip edges start to loosen and curl up, you may trim the loose edges. Do not remove adhesive strips completely unless your health care provider tells you to do that.  Check your insertion site every day for signs of infection. Check for: ? Redness, swelling, or pain. ? Fluid or blood. ? Pus or a bad smell. ? Warmth.  Do not take baths, swim, or use a hot tub until your health care provider approves.  You may shower 24-48 hours after the procedure, or as directed by your health care provider. ? Remove the dressing and gently wash the site with plain soap and water. ? Pat the area dry with a clean towel. ? Do not rub the site. That could cause bleeding.  Do not apply powder or lotion to the site. Activity   For 24 hours after the procedure, or as directed by your health care provider: ? Do not flex or bend the affected arm. ? Do not push or pull heavy objects with the affected arm. ? Do not  drive yourself home from the hospital or clinic. You may drive 24 hours after the procedure unless your health care provider tells you not to. ? Do not operate machinery or power tools.  Do not lift anything that is heavier than 10 lb (4.5 kg), or the limit that you are told, until your health care provider says that it is safe.  Ask your health care provider when it is okay to: ? Return to work or school. ? Resume usual physical activities or sports. ? Resume sexual activity. General instructions  If the catheter site starts to bleed, raise your arm and put firm pressure on the site. If the bleeding does not stop, get help right away. This is a medical emergency.  If you went home on the same day as your procedure, a responsible adult should be with you for the first 24 hours after you arrive home.  Keep all follow-up visits as told by your health care provider. This is important. Contact a health care provider if:  You have a fever.  You have redness, swelling, or yellow drainage around your insertion site. Get help right away if:  You have unusual pain at the radial site.  The catheter insertion area swells very fast.  The insertion area is bleeding, and the bleeding does not stop when you hold steady pressure on the area.  Your arm or hand becomes pale, cool, tingly, or numb. These symptoms may represent a serious problem   that is an emergency. Do not wait to see if the symptoms will go away. Get medical help right away. Call your local emergency services (911 in the [place] Do not drive yourself to the hospital. Summary  After the procedure, it is common to have bruising and tenderness at the site.  Follow instructions from your health care provider about how to take care of your radial site wound. Check the wound every day for signs of infection.  Do not lift anything that is heavier than 10 lb (4.5 kg), or the limit that you are told, until your health care provider says  that it is safe. This information is not intended to replace advice given to you by your health care provider. Make sure you discuss any questions you have with your health care provider. Document Revised: 12/28/2017 Document Reviewed: 12/28/2017 Elsevier Patient Education  2020 [employer]  

## 2020-02-05 ENCOUNTER — Ambulatory Visit: Payer: PPO | Attending: Internal Medicine

## 2020-02-05 ENCOUNTER — Other Ambulatory Visit: Payer: Self-pay

## 2020-02-05 DIAGNOSIS — Z23 Encounter for immunization: Secondary | ICD-10-CM | POA: Insufficient documentation

## 2020-02-06 NOTE — Progress Notes (Signed)
Cardiology Office Note    Date:  02/07/2020   ID:  Emily [name], DOB [birth year], MRN [id number]  PCP:  Kathyrn Drown, MD  Cardiologist: Dorris Carnes, MD    Chief Complaint  Patient presents with  . Follow-up    s/p cardiac catheterization    History of Present Illness:    Emily [name] is a 69 y.o. female with past medical history of HTN, HLD,  Fibromyalgia and GERD who presents to the office today for follow-up from her recent cardiac catheterization.  She was last examined by Dr. Harrington Challenger on 01/18/2020 and reported developing chest pain which started while at church and radiated into her back and left axilla. She reported associated dyspnea and nausea at that time. She also reported worsening dyspnea on exertion for the past 6+ months. Given her symptoms along with significant family history of CAD, a cardiac catheterization was recommended for definitive evaluation. This was performed by Dr. Claiborne Billings on 01/23/2020 and showed torturous but otherwise normal coronary arteries without obstructive disease. She did have normal LV function with LVEDP at 13 mmHg and normal right heart pressures. It was recommended that she have a transthoracic echocardiogram to quantify the extent of LVH and possible diastolic dysfunction in the setting of her dyspnea on exertion.  In talking with the patient today, she reports still having dyspnea on exertion. She denies any associated orthopnea, PND or lower extremity edema. Her episodes of chest discomfort can occur sporadically and are not associated with exertion or positional changes. She has not noticed any association with food consumption.  She does report good compliance with her PPI.  Was informed in the past she had a small hiatal hernia but says this evaluation was 10+ years ago.  She denies any palpitations, dizziness or presyncope. Reports good compliance with her current medication regimen and BP is well controlled at 118/70 during today's visit.    Past Medical History:  Diagnosis Date  . Acid reflux   . Anxiety   . Arthritis   . CHF (congestive heart failure) (HCC)    90's swelling body-no breathing problems at that time-no cardiac -in dr s Wolfgang [place] office Craig give lasix and potassium  . Complication of anesthesia    History of being told she was allergic to something in 1980 no problems since  . Depression   . Fibromyalgia   . Fibromyalgia 09/10/2019  . History of blood transfusion reaction 1978  . History of hiatal hernia   . HNP (herniated nucleus pulposus)   . Hypertension    Does not see a cardiologist no stress or echo  . Joint pain   . Nerve pain    right leg   . Ruptured lumbar disc   . Sleep apnea    Uses CPAP every night setting at 7 mm/Hg    Past Surgical History:  Procedure Laterality Date  . BACK SURGERY     x3  . BLADDER REPAIR     tear during during birth of child  . CARPAL TUNNEL RELEASE Bilateral   . CESAREAN SECTION    . CHOLECYSTECTOMY     gallbladder disease; stayed sick  . COLONOSCOPY N/A 04/28/2016   Procedure: COLONOSCOPY;  Surgeon: [name], MD;  Location: [facility];  Service: Endoscopy;  Laterality: N/A;  . ESOPHAGOGASTRODUODENOSCOPY N/A 04/28/2016   Procedure: ESOPHAGOGASTRODUODENOSCOPY (EGD);  Surgeon: [name], MD;  Location: [facility];  Service: Endoscopy;  Laterality: N/A;  12:00  . GALLBLADDER  SURGERY    . knee right (torn Cart)    . REPLACEMENT TOTAL KNEE     left  . RIGHT/LEFT HEART CATH AND CORONARY ANGIOGRAPHY N/A 01/23/2020   Procedure: RIGHT/LEFT HEART CATH AND CORONARY ANGIOGRAPHY;  Surgeon: [name], MD;  Location: [facility];  Service: Cardiovascular;  Laterality: N/A;  . ROTATOR CUFF REPAIR Bilateral    right and left  . SPINAL FUSION    . TOTAL KNEE ARTHROPLASTY Right 12/29/2017   Procedure: TOTAL KNEE ARTHROPLASTY-right knee;  Surgeon: [name], MD;  Location: [facility];  Service: Orthopedics;  Laterality: Right;  . TUBAL  LIGATION    . vein right leg removed    . wrist ganglion cyst left      Current Medications: Outpatient Medications Prior to Visit  Medication Sig Dispense Refill  . acetaminophen (TYLENOL) 500 MG tablet Take 500 mg by mouth every 6 (six) hours as needed. She takes two Q 8 hours prn    . aspirin 81 MG chewable tablet Chew by mouth daily.    . diazepam (VALIUM) 5 MG tablet Take 5 mg by mouth every 6 (six) hours as needed for muscle spasms.     . diphenhydrAMINE (BENADRYL ALLERGY) 25 MG tablet Take 1 tablet (25 mg total) by mouth every 4 (four) hours. Take with hydrocodone 60 tablet 5  . docusate sodium (COLACE) 100 MG capsule Take 1 capsule (100 mg total) by mouth 2 (two) times daily. 10 capsule 0  . DULoxetine (CYMBALTA) 60 MG capsule Take 1 capsule (60 mg total) by mouth daily. 30 capsule 5  . fexofenadine (ALLEGRA) 180 MG tablet Take 180 mg by mouth daily as needed for allergies or rhinitis.    . hydrochlorothiazide (HYDRODIURIL) 25 MG tablet Take 1 tablet (25 mg total) by mouth daily. 30 tablet 5  . Misc. Devices (TOILET SEAT ELEVATOR) MISC Elevated toilet seat if needed 1 each 0  . nitroGLYCERIN (NITROSTAT) 0.4 MG SL tablet Place 1 tablet (0.4 mg total) under the tongue every 5 (five) minutes as needed. 25 tablet 3  . pantoprazole (PROTONIX) 40 MG tablet Take 1 tablet (40 mg total) by mouth daily. 30 tablet 5  . potassium chloride SA (KLOR-CON M20) 20 MEQ tablet Take 1 tablet (20 mEq total) by mouth daily. 30 tablet 5  . pravastatin (PRAVACHOL) 20 MG tablet Take 1 tablet (20 mg total) by mouth daily. 90 tablet 5  . verapamil (CALAN-SR) 180 MG CR tablet Take 1 tablet (180 mg total) by mouth daily. 30 tablet 5   No facility-administered medications prior to visit.     Allergies:   Codeine, Oxycodone-acetaminophen, Sulfamethoxazole-trimethoprim, Celebrex [celecoxib], Hydrocodone-acetaminophen, Losartan, Lyrica [pregabalin], and Tramadol   Social History   Socioeconomic History  .  Marital status: Married    Spouse name: Not on file  . Number of children: Not on file  . Years of education: college   . Highest education level: Not on file  Occupational History  . Occupation: nurse   Tobacco Use  . Smoking status: Never Smoker  . Smokeless tobacco: Never Used  Substance and Sexual Activity  . Alcohol use: No  . Drug use: No  . Sexual activity: Never    Birth control/protection: Post-menopausal  Other Topics Concern  . Not on file  Social History Narrative  . Not on file   Social Determinants of Health   Financial Resource Strain:   . Difficulty of Paying Living Expenses: Not on file  Food  Insecurity:   . Worried About Charity fundraiser in the Last Year: Not on file  . Ran Out of Food in the Last Year: Not on file  Transportation Needs:   . Lack of Transportation (Medical): Not on file  . Lack of Transportation (Non-Medical): Not on file  Physical Activity:   . Days of Exercise per Week: Not on file  . Minutes of Exercise per Session: Not on file  Stress:   . Feeling of Stress : Not on file  Social Connections:   . Frequency of Communication with Friends and Family: Not on file  . Frequency of Social Gatherings with Friends and Family: Not on file  . Attends Religious Services: Not on file  . Active Member of Clubs or Organizations: Not on file  . Attends Archivist Meetings: Not on file  . Marital Status: Not on file     Family History:  The patient's family history includes Arthritis in an other family member; Asthma in an other family member; Cancer in her father and another family member; Coronary artery disease in an other family member; Coronary artery disease (age of onset: 54) in her brother; Diabetes in her brother and another family member; Heart disease in her brother and mother; Hypertension in her father.   Review of Systems:   Please see the history of present illness.     General:  No chills, fever, night sweats or weight  changes.  Cardiovascular:  No edema, orthopnea, palpitations, paroxysmal nocturnal dyspnea. Positive for chest pain and dyspnea on exertion.  Dermatological: No rash, lesions/masses Respiratory: No cough, dyspnea Urologic: No hematuria, dysuria Abdominal:   No nausea, vomiting, diarrhea, bright red blood per rectum, melena, or hematemesis Neurologic:  No visual changes, wkns, changes in mental status. All other systems reviewed and are otherwise negative except as noted above.   Physical Exam:    VS:  BP 118/70   Pulse 78   Temp 98 F (36.7 C)   Ht 5\' 2"  (1.575 m)   Wt 185 lb (83.9 kg)   SpO2 98%   BMI 33.84 kg/m    General: Well developed, well nourished,female appearing in no acute distress. Head: Normocephalic, atraumatic, sclera non-icteric.  Neck: No carotid bruits. JVD not elevated.  Lungs: Respirations regular and unlabored, without wheezes or rales.  Heart: Regular rate and rhythm. No S3 or S4.  No murmur, no rubs, or gallops appreciated. Abdomen: Soft, non-tender, non-distended. No obvious abdominal masses. Msk:  Strength and tone appear normal for age. No obvious joint deformities or effusions. Extremities: No clubbing or cyanosis. No edema.  Distal pedal pulses are 2+ bilaterally. Radial cath site stable without ecchymosis.  Neuro: Alert and oriented X 3. Moves all extremities spontaneously. No focal deficits noted. Psych:  Responds to questions appropriately with a normal affect. Skin: No rashes or lesions noted  Wt Readings from Last 3 Encounters:  02/07/20 185 lb (83.9 kg)  01/23/20 183 lb (83 kg)  01/18/20 183 lb (83 kg)     Studies/Labs Reviewed:   EKG:  EKG is not ordered today.   Recent Labs: 09/27/2019: ALT 20 01/13/2020: Platelets 227 01/23/2020: BUN 10; Creatinine, Ser 0.77; Hemoglobin 12.6; Potassium 3.2; Sodium 118   Lipid Panel    Component Value Date/Time   CHOL 164 09/27/2019 0900   TRIG 355 (H) 09/27/2019 0900   HDL 37 (L) 09/27/2019 0900    CHOLHDL 4.4 09/27/2019 0900   CHOLHDL 4.5 08/20/2013 0741   VLDL  3 (H) 08/20/2013 0741   LDLCALC 71 09/27/2019 0900    Additional studies/ records that were reviewed today include:   Cardiac Catheterization: 01/2020  The left ventricular systolic function is normal.   Tortuous but otherwise normal coronary arteries without obstructive disease.  Normal LV function with left ventricular hypertrophy; LVEDP 13 mm  Normal right heart pressures.  RECOMMENDATION: Suspect nonischemic chest pain.  Recommend 2D echo Doppler to quantify extent of LVH and potential for diastolic dysfunction contributing to the patient's exertional dyspnea.  Patient will follow up with Dr. Dorris Carnes.  Assessment:    1. DOE (dyspnea on exertion)   2. Essential hypertension   3. Mixed hyperlipidemia   4. GERD without esophagitis      Plan:   In order of problems listed above:  1. Dyspnea on Exertion - She reports having dyspnea on exertion for the past 6 months but denies any associated orthopnea, PND or lower extremity edema. She does appear euvolemic by examination today.   - recent R/LHC was reassuring as outlined above given this showed normal coronary arteries without obstructive disease and normal LV function with LVEDP at 13 mmHg and normal right heart pressures. D-dimer negative by prior labs. No prior tobacco use.  - It was recommended at that time to consider an echocardiogram to quantify the extent of LVH and possible diastolic dysfunction. Reviewed this with the patient today and she is in agreement to obtaining this. If echocardiogram remains unrevealing, I did encourage her to follow-up with her PCP to discuss further evaluation as she does mention a history of a hiatal hernia in the past.  2. HTN - BP is well controlled at 118/70 during today's visit.  Continue current medication regimen with HCTZ 25 mg daily and Verapamil 180 mg daily.  3. HLD - This is followed by her PCP. LDL at  71 in 09/2019. She remains on Pravastatin 20 mg daily.  4. GERD - continue Protonix 40 mg daily.   Medication Adjustments/Labs and Tests Ordered: Current medicines are reviewed at length with the patient today.  Concerns regarding medicines are outlined above.  Medication changes, Labs and Tests ordered today are listed in the Patient Instructions below. Patient Instructions  Medication Instructions:  Your physician recommends that you continue on your current medications as directed. Please refer to the Current Medication list given to you today.  *If you need a refill on your cardiac medications before your next appointment, please call your pharmacy*   Lab Work: NONE  If you have labs (blood work) drawn today and your tests are completely normal, you will receive your results only by: Marland Kitchen MyChart Message (if you have MyChart) OR . A paper copy in the mail If you have any lab test that is abnormal or we need to change your treatment, we will call you to review the results.   Testing/Procedures: Your physician has requested that you have an echocardiogram. Echocardiography is a painless test that uses sound waves to create images of your heart. It provides your doctor with information about the size and shape of your heart and how well your heart's chambers and valves are working. This procedure takes approximately one hour. There are no restrictions for this procedure.     Follow-Up: At [facility], you and your health needs are our priority.  As part of our continuing mission to provide you with exceptional heart care, we have created designated Provider Care Teams.  These Care Teams include your primary Cardiologist (  physician) and Advanced Practice Providers (APPs -  Physician Assistants and Nurse Practitioners) who all work together to provide you with the care you need, when you need it.  We recommend signing up for the patient portal called "MyChart".  Sign up information is  provided on this After Visit Summary.  MyChart is used to connect with patients for Virtual Visits (Telemedicine).  Patients are able to view lab/test results, encounter notes, upcoming appointments, etc.  Non-urgent messages can be sent to your provider as well.   To learn more about what you can do with MyChart, go to [email or web address].    Your next appointment:   1 year(s)  The format for your next appointment:   In Person  Provider:   You may see No primary care provider on file. or one of the following Advanced Practice Providers on your designated Care Team:    Bernerd Pho, PA-C   Ermalinda Barrios, [place]     Other Instructions Thank you for choosing [facility]!    Signed, Erma Heritage, PA-C  02/07/2020 5:10 PM    [facility] [address] [place], [place] [address] Phone: [phone] Fax: [phone]

## 2020-02-07 ENCOUNTER — Encounter: Payer: Self-pay | Admitting: Student

## 2020-02-07 ENCOUNTER — Ambulatory Visit (INDEPENDENT_AMBULATORY_CARE_PROVIDER_SITE_OTHER): Payer: PPO | Admitting: Student

## 2020-02-07 VITALS — BP 118/70 | HR 78 | Temp 98.0°F | Ht 62.0 in | Wt 185.0 lb

## 2020-02-07 DIAGNOSIS — R0600 Dyspnea, unspecified: Secondary | ICD-10-CM

## 2020-02-07 DIAGNOSIS — I10 Essential (primary) hypertension: Secondary | ICD-10-CM

## 2020-02-07 DIAGNOSIS — K219 Gastro-esophageal reflux disease without esophagitis: Secondary | ICD-10-CM

## 2020-02-07 DIAGNOSIS — E782 Mixed hyperlipidemia: Secondary | ICD-10-CM

## 2020-02-07 DIAGNOSIS — R0609 Other forms of dyspnea: Secondary | ICD-10-CM

## 2020-02-07 NOTE — Patient Instructions (Signed)
Medication Instructions:  Your physician recommends that you continue on your current medications as directed. Please refer to the Current Medication list given to you today.  *If you need a refill on your cardiac medications before your next appointment, please call your pharmacy*   Lab Work: NONE  If you have labs (blood work) drawn today and your tests are completely normal, you will receive your results only by: Marland Kitchen MyChart Message (if you have MyChart) OR . A paper copy in the mail If you have any lab test that is abnormal or we need to change your treatment, we will call you to review the results.   Testing/Procedures: Your physician has requested that you have an echocardiogram. Echocardiography is a painless test that uses sound waves to create images of your heart. It provides your doctor with information about the size and shape of your heart and how well your heart's chambers and valves are working. This procedure takes approximately one hour. There are no restrictions for this procedure.     Follow-Up: At [facility], you and your health needs are our priority.  As part of our continuing mission to provide you with exceptional heart care, we have created designated Provider Care Teams.  These Care Teams include your primary Cardiologist (physician) and Advanced Practice Providers (APPs -  Physician Assistants and Nurse Practitioners) who all work together to provide you with the care you need, when you need it.  We recommend signing up for the patient portal called "MyChart".  Sign up information is provided on this After Visit Summary.  MyChart is used to connect with patients for Virtual Visits (Telemedicine).  Patients are able to view lab/test results, encounter notes, upcoming appointments, etc.  Non-urgent messages can be sent to your provider as well.   To learn more about what you can do with MyChart, go to [email or web address].    Your next appointment:   1 year(s)   The format for your next appointment:   In Person  Provider:   You may see No primary care provider on file. or one of the following Advanced Practice Providers on your designated Care Team:    Bernerd Pho, PA-C   Ermalinda Barrios, [place]     Other Instructions Thank you for choosing [place]!

## 2020-02-14 ENCOUNTER — Other Ambulatory Visit: Payer: Self-pay

## 2020-02-14 ENCOUNTER — Ambulatory Visit (HOSPITAL_COMMUNITY)
Admission: RE | Admit: 2020-02-14 | Discharge: 2020-02-14 | Disposition: A | Discharge status: Home / Self Care | Payer: PPO | Source: Ambulatory Visit | Attending: Student | Admitting: Student

## 2020-02-14 DIAGNOSIS — R0600 Dyspnea, unspecified: Secondary | ICD-10-CM | POA: Insufficient documentation

## 2020-02-14 NOTE — Progress Notes (Signed)
*  PRELIMINARY RESULTS* Echocardiogram 2D Echocardiogram has been performed.  Samuel Germany 02/14/2020, 2:36 PM

## 2020-03-04 ENCOUNTER — Ambulatory Visit: Payer: PPO | Attending: Internal Medicine

## 2020-03-04 DIAGNOSIS — Z23 Encounter for immunization: Secondary | ICD-10-CM

## 2020-03-04 NOTE — Progress Notes (Signed)
   Covid-19 Vaccination Clinic  Name:  Emily [name]    MRN: [id number] DOB: 02-May-1951  03/04/2020  Ms. Turnier was observed post Covid-19 immunization for 15 minutes without incident. She was provided with Vaccine Information Sheet and instruction to access the V-Safe system.   Ms. Estela was instructed to call 911 with any severe reactions post vaccine: Marland Kitchen Difficulty breathing  . Swelling of face and throat  . A fast heartbeat  . A bad rash all over body  . Dizziness and weakness   Immunizations Administered    Name Date Dose VIS Date Route   Moderna COVID-19 Vaccine 03/04/2020 10:48 AM 0.5 mL 11/06/2019 Intramuscular   Manufacturer: Moderna   Lot: [id number]   [id number]

## 2020-03-24 ENCOUNTER — Ambulatory Visit: Payer: PPO | Admitting: Orthopedic Surgery

## 2020-03-24 ENCOUNTER — Encounter: Payer: Self-pay | Admitting: Orthopedic Surgery

## 2020-03-24 ENCOUNTER — Other Ambulatory Visit: Payer: Self-pay

## 2020-03-24 VITALS — BP 153/79 | HR 72 | Ht 62.0 in | Wt 180.0 lb

## 2020-03-24 DIAGNOSIS — M65331 Trigger finger, right middle finger: Secondary | ICD-10-CM | POA: Diagnosis not present

## 2020-03-24 DIAGNOSIS — M6530 Trigger finger, unspecified finger: Secondary | ICD-10-CM

## 2020-03-24 NOTE — Progress Notes (Signed)
Chief Complaint  Patient presents with  . Hand Problem    right middle trigger finger     Encounter Diagnosis  Name Primary?  . Trigger finger, acquired Yes   Requests injection   H/O trigger finger long left// pain locking recurrent    Trigger finger injection  Diagnosis  Left long finger tenosynovitis Procedure injection A1 pulley Medications lidocaine 1% 1 mL and Depo-Medrol 40 mg 1 mL Skin prep alcohol and ethyl chloride Verbal consent was obtained Timeout confirmed the injection site  After cleaning the skin with alcohol and anesthetizing the skin with ethyl chloride the A1 pulley was palpated and the injection was performed without complication

## 2020-03-24 NOTE — Patient Instructions (Signed)
Trigger Finger  Trigger finger, also called stenosing tenosynovitis,  is a condition that causes a finger to get stuck in a bent position. Each finger has a tendon, which is a tough, cord-like tissue that connects muscle to bone, and each tendon passes through a tunnel of tissue called a tendon sheath. To move your finger, your tendon needs to glide freely through the sheath. Trigger finger happens when the tendon or the sheath thickens, making it difficult to move your finger. Trigger finger can affect any finger or a thumb. It may affect more than one finger. Mild cases may clear up with rest and medicine. Severe cases require more treatment. What are the causes? Trigger finger is caused by a thickened finger tendon or tendon sheath. The cause of this thickening is not known. What increases the risk? The following factors may make you more likely to develop this condition:  Doing activities that require a strong grip.  Having rheumatoid arthritis, gout, or diabetes.  Being 40-60 years old.  Being female. What are the signs or symptoms? Symptoms of this condition include:  Pain when bending or straightening your finger.  Tenderness or swelling where your finger attaches to the palm of your hand.  A lump in the palm of your hand or on the inside of your finger.  Hearing a noise like a pop or a snap when you try to straighten your finger.  Feeling a catching or locking sensation when you try to straighten your finger.  Being unable to straighten your finger. How is this diagnosed? This condition is diagnosed based on your symptoms and a physical exam. How is this treated? This condition may be treated by:  Resting your finger and avoiding activities that make symptoms worse.  Wearing a finger splint to keep your finger extended.  Taking NSAIDs, such as ibuprofen, to relieve pain and swelling.  Doing gentle exercises to stretch the finger as told by your health care provider.   Having medicine that reduces swelling and inflammation (steroids) injected into the tendon sheath. Injections may need to be repeated.  Having surgery to open the tendon sheath. This may be done if other treatments do not work and you cannot straighten your finger. You may need physical therapy after surgery. Follow these instructions at home: If you have a splint:  Wear the splint as told by your health care provider. Remove it only as told by your health care provider.  Loosen it if your fingers tingle, become numb, or turn cold and blue.  Keep it clean.  If the splint is not waterproof: ? Do not let it get wet. ? Cover it with a watertight covering when you take a bath or shower. Managing pain, stiffness, and swelling     If directed, apply heat to the affected area as often as told by your health care provider. Use the heat source that your health care provider recommends, such as a moist heat pack or a heating pad.  Place a towel between your skin and the heat source.  Leave the heat on for 20-30 minutes.  Remove the heat if your skin turns bright red. This is especially important if you are unable to feel pain, heat, or cold. You may have a greater risk of getting burned. If directed, put ice on the painful area. To do this:  If you have a removable splint, remove it as told by your health care provider.  Put ice in a plastic bag.  Place a   towel between your skin and the bag or between your splint and the bag.  Leave the ice on for 20 minutes, 2-3 times a day.  Activity  Rest your finger as told by your health care provider. Avoid activities that make the pain worse.  Return to your normal activities as told by your health care provider. Ask your health care provider what activities are safe for you.  Do exercises as told by your health care provider.  Ask your health care provider when it is safe to drive if you have a splint on your hand. General instructions   Take over-the-counter and prescription medicines only as told by your health care provider.  Keep all follow-up visits as told by your health care provider. This is important. Contact a health care provider if:  Your symptoms are not improving with home care. Summary  Trigger finger, also called stenosing tenosynovitis, causes your finger to get stuck in a bent position. This can make it difficult and painful to straighten your finger.  This condition develops when a finger tendon or tendon sheath thickens.  Treatment may include resting your finger, wearing a splint, and taking medicines.  In severe cases, surgery to open the tendon sheath may be needed. This information is not intended to replace advice given to you by your health care provider. Make sure you discuss any questions you have with your health care provider. Document Revised: 04/09/2019 Document Reviewed: 04/09/2019 Elsevier Patient Education  2020 [employer]  

## 2020-04-09 ENCOUNTER — Ambulatory Visit (INDEPENDENT_AMBULATORY_CARE_PROVIDER_SITE_OTHER): Payer: PPO | Admitting: Family Medicine

## 2020-04-09 ENCOUNTER — Encounter: Payer: Self-pay | Admitting: Family Medicine

## 2020-04-09 ENCOUNTER — Other Ambulatory Visit: Payer: Self-pay

## 2020-04-09 VITALS — BP 138/82 | HR 76 | Temp 96.1°F | Ht 60.0 in | Wt 180.6 lb

## 2020-04-09 DIAGNOSIS — L237 Allergic contact dermatitis due to plants, except food: Secondary | ICD-10-CM

## 2020-04-09 MED ORDER — TRIAMCINOLONE ACETONIDE 0.1 % EX CREA: 1.0000 "application " | TOPICAL_CREAM | Freq: Two times a day (BID) | CUTANEOUS | 0 refills | Status: DC

## 2020-04-09 MED ORDER — PREDNISONE 10 MG PO TABS: ORAL_TABLET | ORAL | 0 refills | Status: DC

## 2020-04-09 MED ORDER — HYDROXYZINE HCL 25 MG PO TABS: 25.0000 mg | ORAL_TABLET | Freq: Three times a day (TID) | ORAL | 0 refills | Status: DC | PRN

## 2020-04-09 NOTE — Progress Notes (Signed)
Patient ID: Emily [name], female    DOB: 1951/10/12, 69 y.o.   MRN: [id number]   Chief Complaint  Patient presents with  . Posion Oak    Pt here today for poison oak on both arms and legs. Pt was putting out mulch last week. Pt states areas are itchy and she is unable to sleep due to poison oak being uncomfortable. Pt has tried Benadryl, Triamcinolone cream, Tylenol, Hydrocortisone cream. Not much relief from any home treatments.    Subjective:    HPI  Pt seen for rash on bilateral arms and bilateral shins.  Started 5 days ago. She was out mulching in yard when thinking she came in contact with poison ivy/oak. Has been using hydrocortisone cream and triamcinolone cream but ran out. Has been using benadryl prn.  Having redness, blistering and weeping.  Very itchy.  Hasn't improved with topical and benadryl treatment.  No sick contacts, no others at home with similar rash.  No fever.   Medical History Emily [name] has a past medical history of Acid reflux, Anxiety, Arthritis, CHF (congestive heart failure) ([place]), Complication of anesthesia, Depression, Fibromyalgia, Fibromyalgia (09/10/2019), History of blood transfusion reaction (1978), History of hiatal hernia, HNP (herniated nucleus pulposus), Hypertension, Joint pain, Nerve pain, Ruptured lumbar disc, and Sleep apnea.   Outpatient Encounter Medications as of 04/09/2020  Medication Sig  . acetaminophen (TYLENOL) 500 MG tablet Take 500 mg by mouth every 6 (six) hours as needed. She takes two Q 8 hours prn  . diazepam (VALIUM) 5 MG tablet Take 5 mg by mouth every 6 (six) hours as needed for muscle spasms.   . diphenhydrAMINE (BENADRYL ALLERGY) 25 MG tablet Take 1 tablet (25 mg total) by mouth every 4 (four) hours. Take with hydrocodone  . docusate sodium (COLACE) 100 MG capsule Take 1 capsule (100 mg total) by mouth 2 (two) times daily.  . DULoxetine (CYMBALTA) 60 MG capsule Take 1 capsule (60 mg total) by mouth daily.  . fexofenadine  (ALLEGRA) 180 MG tablet Take 180 mg by mouth daily as needed for allergies or rhinitis.  . hydrochlorothiazide (HYDRODIURIL) 25 MG tablet Take 1 tablet (25 mg total) by mouth daily.  . Misc. Devices (TOILET SEAT ELEVATOR) MISC Elevated toilet seat if needed  . nitroGLYCERIN (NITROSTAT) 0.4 MG SL tablet Place 1 tablet (0.4 mg total) under the tongue every 5 (five) minutes as needed.  . pantoprazole (PROTONIX) 40 MG tablet Take 1 tablet (40 mg total) by mouth daily.  . potassium chloride SA (KLOR-CON M20) 20 MEQ tablet Take 1 tablet (20 mEq total) by mouth daily.  . pravastatin (PRAVACHOL) 20 MG tablet Take 1 tablet (20 mg total) by mouth daily.  . verapamil (CALAN-SR) 180 MG CR tablet Take 1 tablet (180 mg total) by mouth daily.  . hydrOXYzine (ATARAX/VISTARIL) 25 MG tablet Take 1 tablet (25 mg total) by mouth 3 (three) times daily as needed (caution with sedation).  . predniSONE (DELTASONE) 10 MG tablet Take 4 tabs p.o. x3D, then take 3 tab x 3D, then take 2 tab x 3D, then 1 tab x 3D.  Marland Kitchen triamcinolone cream (KENALOG) 0.1 % Apply 1 application topically 2 (two) times daily.   No facility-administered encounter medications on file as of 04/09/2020.     Review of Systems  Constitutional: Negative for chills and fever.  Musculoskeletal: Negative for arthralgias and myalgias.  Skin: Positive for rash. Negative for wound.     Vitals BP 138/82   Pulse 76  Temp (!) 96.1 F (35.6 C)   Ht 5' (1.524 m)   Wt 180 lb 9.6 oz (81.9 kg)   SpO2 96%   BMI 35.27 kg/m   Objective:   Physical Exam Vitals and nursing note reviewed.  Constitutional:      Appearance: Normal appearance.  HENT:     Head: Normocephalic and atraumatic.  Pulmonary:     Effort: Pulmonary effort is normal. No respiratory distress.     Breath sounds: No rales.  Musculoskeletal:        General: Normal range of motion.     Right lower leg: No edema.     Left lower leg: No edema.  Skin:    General: Skin is warm and dry.      Findings: Rash present. No lesion.     Comments: +erythematous rash with blisters and weeping on bilateral medial forearms and bilateral shins.    Neurological:     General: No focal deficit present.     Mental Status: She is alert and oriented to person, place, and time.     Cranial Nerves: No cranial nerve deficit.  Psychiatric:        Mood and Affect: Mood normal.        Behavior: Behavior normal.        Thought Content: Thought content normal.        Judgment: Judgment normal.      Assessment and Plan   1. Contact dermatitis due to poison ivy - predniSONE (DELTASONE) 10 MG tablet; Take 4 tabs p.o. x3D, then take 3 tab x 3D, then take 2 tab x 3D, then 1 tab x 3D.  Dispense: 30 tablet; Refill: 0 - hydrOXYzine (ATARAX/VISTARIL) 25 MG tablet; Take 1 tablet (25 mg total) by mouth 3 (three) times daily as needed (caution with sedation).  Dispense: 30 tablet; Refill: 0 - triamcinolone cream (KENALOG) 0.1 %; Apply 1 application topically 2 (two) times daily.  Dispense: 60 g; Refill: 0   Gave prednisone taper for 12 days.  Refill on triamcinolone cream and take hydroxyzine prn. Avoid taking valium, allegra and benadryl if taking hydroxyzine for itching. Cautioned about sedation with hydroxyzine.  Pt in agreement with plan.  F/u prn. Patient was seen and I agree with the management of the patient Dr. Sallee Lange

## 2020-05-09 ENCOUNTER — Other Ambulatory Visit: Payer: Self-pay

## 2020-05-09 ENCOUNTER — Encounter: Payer: Self-pay | Admitting: Emergency Medicine

## 2020-05-09 ENCOUNTER — Ambulatory Visit
Admission: EM | Admit: 2020-05-09 | Discharge: 2020-05-09 | Disposition: A | Discharge status: Home / Self Care | Payer: PPO | Attending: Emergency Medicine | Admitting: Emergency Medicine

## 2020-05-09 DIAGNOSIS — H1013 Acute atopic conjunctivitis, bilateral: Secondary | ICD-10-CM | POA: Diagnosis not present

## 2020-05-09 MED ORDER — OFLOXACIN 0.3 % OP SOLN: 1.0000 [drp] | Freq: Four times a day (QID) | OPHTHALMIC | 0 refills | Status: DC

## 2020-05-09 MED ORDER — OLOPATADINE HCL 0.2 % OP SOLN: 1.0000 [drp] | OPHTHALMIC | 0 refills | Status: AC

## 2020-05-09 NOTE — ED Triage Notes (Signed)
Both eyes red and itchy x 2 weeks.  Seen by eye Dr and was told to do warm compresses and systane eyedrops.  Pt has been doing this and has no relief.

## 2020-05-09 NOTE — Discharge Instructions (Addendum)
Use eye drops as prescribed and to completion Pataday was prescribed for eye itchiness Continue to use Systane for eye dryness Ofloxacin eyedrop was prescribed to prevent infection Follow-up with ophthalmologist or go to ED if symptoms persists such as fever, chills, redness, swelling, eye pain, painful eye movements, vision changes, etc..Marland Kitchen

## 2020-05-09 NOTE — ED Provider Notes (Signed)
[facility]   [id number] 05/09/20 Arrival Time: 1054  CC: Red eye  SUBJECTIVE:  Emily [name] is a 69 y.o. female who presents with complaint of bilateral eye redness and itchiness that began 2 weeks ago.  Has seen ophthalmologist and has been  it is dry eye.  Denies a precipitating event, trauma, or close contacts with similar symptoms.  Has tried OTC Systane without relief.  Nothing make her symptoms worse.  Denies similar symptoms in the past.  Denies fever, chills, nausea, vomiting, eye pain, painful eye movements, halos, discharge, itching, vision changes, double vision, FB sensation, periorbital erythema.     Denies contact lens use.    ROS: As per HPI.  All other pertinent ROS negative.     Past Medical History:  Diagnosis Date  . Acid reflux   . Anxiety   . Arthritis   . CHF (congestive heart failure) ([facility])    90's swelling body-no breathing problems at that time-no cardiac -in dr s Wolfgang [place] office [place] give lasix and potassium  . Complication of anesthesia    History of being told she was allergic to something in 1980 no problems since  . Depression   . Fibromyalgia   . Fibromyalgia 09/10/2019  . History of blood transfusion reaction 1978  . History of hiatal hernia   . HNP (herniated nucleus pulposus)   . Hypertension    Does not see a cardiologist no stress or echo  . Joint pain   . Nerve pain    right leg   . Ruptured lumbar disc   . Sleep apnea    Uses CPAP every night setting at 7 mm/Hg   Past Surgical History:  Procedure Laterality Date  . BACK SURGERY     x3  . BLADDER REPAIR     tear during during birth of child  . CARPAL TUNNEL RELEASE Bilateral   . CESAREAN SECTION    . CHOLECYSTECTOMY     gallbladder disease; stayed sick  . COLONOSCOPY N/A 04/28/2016   Procedure: COLONOSCOPY;  Surgeon: [name], MD;  Location: [facility];  Service: Endoscopy;  Laterality: N/A;  . ESOPHAGOGASTRODUODENOSCOPY N/A 04/28/2016   Procedure:  ESOPHAGOGASTRODUODENOSCOPY (EGD);  Surgeon: [name], MD;  Location: [facility];  Service: Endoscopy;  Laterality: N/A;  12:00  . GALLBLADDER SURGERY    . knee right (torn Cart)    . REPLACEMENT TOTAL KNEE     left  . RIGHT/LEFT HEART CATH AND CORONARY ANGIOGRAPHY N/A 01/23/2020   Procedure: RIGHT/LEFT HEART CATH AND CORONARY ANGIOGRAPHY;  Surgeon: [name], MD;  Location: [facility];  Service: Cardiovascular;  Laterality: N/A;  . ROTATOR CUFF REPAIR Bilateral    right and left  . SPINAL FUSION    . TOTAL KNEE ARTHROPLASTY Right 12/29/2017   Procedure: TOTAL KNEE ARTHROPLASTY-right knee;  Surgeon: [name], MD;  Location: [facility];  Service: Orthopedics;  Laterality: Right;  . TUBAL LIGATION    . vein right leg removed    . wrist ganglion cyst left     Allergies  Allergen Reactions  . Codeine Itching  . Oxycodone-Acetaminophen Itching  . Sulfamethoxazole-Trimethoprim Hives  . Celebrex [Celecoxib] Other (See Comments)    Homicidal thoughts.  . Hydrocodone-Acetaminophen Itching and Other (See Comments)    Takes with benadryl  . Losartan Other (See Comments)    Skin on arms got loose  . Lyrica [Pregabalin] Other (See Comments)    When combined with  narcotics will cause SpO2 to drop  . Tramadol Itching   No current facility-administered medications on file prior to encounter.   Current Outpatient Medications on File Prior to Encounter  Medication Sig Dispense Refill  . acetaminophen (TYLENOL) 500 MG tablet Take 500 mg by mouth every 6 (six) hours as needed. She takes two Q 8 hours prn    . diazepam (VALIUM) 5 MG tablet Take 5 mg by mouth every 6 (six) hours as needed for muscle spasms.     . diphenhydrAMINE (BENADRYL ALLERGY) 25 MG tablet Take 1 tablet (25 mg total) by mouth every 4 (four) hours. Take with hydrocodone 60 tablet 5  . docusate sodium (COLACE) 100 MG capsule Take 1 capsule (100 mg total) by mouth 2 (two) times daily. 10 capsule 0  .  DULoxetine (CYMBALTA) 60 MG capsule Take 1 capsule (60 mg total) by mouth daily. 30 capsule 5  . fexofenadine (ALLEGRA) 180 MG tablet Take 180 mg by mouth daily as needed for allergies or rhinitis.    . hydrochlorothiazide (HYDRODIURIL) 25 MG tablet Take 1 tablet (25 mg total) by mouth daily. 30 tablet 5  . hydrOXYzine (ATARAX/VISTARIL) 25 MG tablet Take 1 tablet (25 mg total) by mouth 3 (three) times daily as needed (caution with sedation). 30 tablet 0  . Misc. Devices (TOILET SEAT ELEVATOR) MISC Elevated toilet seat if needed 1 each 0  . nitroGLYCERIN (NITROSTAT) 0.4 MG SL tablet Place 1 tablet (0.4 mg total) under the tongue every 5 (five) minutes as needed. 25 tablet 3  . pantoprazole (PROTONIX) 40 MG tablet Take 1 tablet (40 mg total) by mouth daily. 30 tablet 5  . potassium chloride SA (KLOR-CON M20) 20 MEQ tablet Take 1 tablet (20 mEq total) by mouth daily. 30 tablet 5  . pravastatin (PRAVACHOL) 20 MG tablet Take 1 tablet (20 mg total) by mouth daily. 90 tablet 5  . predniSONE (DELTASONE) 10 MG tablet Take 4 tabs p.o. x3D, then take 3 tab x 3D, then take 2 tab x 3D, then 1 tab x 3D. 30 tablet 0  . triamcinolone cream (KENALOG) 0.1 % Apply 1 application topically 2 (two) times daily. 60 g 0  . verapamil (CALAN-SR) 180 MG CR tablet Take 1 tablet (180 mg total) by mouth daily. 30 tablet 5   Social History   Socioeconomic History  . Marital status: Married    Spouse name: Not on file  . Number of children: Not on file  . Years of education: college   . Highest education level: Not on file  Occupational History  . Occupation: nurse   Tobacco Use  . Smoking status: Never Smoker  . Smokeless tobacco: Never Used  Substance and Sexual Activity  . Alcohol use: No  . Drug use: No  . Sexual activity: Never    Birth control/protection: Post-menopausal  Other Topics Concern  . Not on file  Social History Narrative  . Not on file   Social Determinants of Health   Financial Resource  Strain:   . Difficulty of Paying Living Expenses:   Food Insecurity:   . Worried About Charity fundraiser in the Last Year:   . Arboriculturist in the Last Year:   Transportation Needs:   . Film/video editor (Medical):   Marland Kitchen Lack of Transportation (Non-Medical):   Physical Activity:   . Days of Exercise per Week:   . Minutes of Exercise per Session:   Stress:   . Feeling of  Stress :   Social Connections:   . Frequency of Communication with Friends and Family:   . Frequency of Social Gatherings with Friends and Family:   . Attends Religious Services:   . Active Member of Clubs or Organizations:   . Attends Archivist Meetings:   Marland Kitchen Marital Status:   Intimate Partner Violence:   . Fear of Current or Ex-Partner:   . Emotionally Abused:   Marland Kitchen Physically Abused:   . Sexually Abused:    Family History  Problem Relation Age of Onset  . Hypertension Father   . Cancer Father        liposarcoma  . Heart disease Mother   . Cancer Other        family history   . Diabetes Other        family history   . Coronary artery disease Other        family history   . Arthritis Other        family history   . Asthma Other   . Diabetes Brother   . Heart disease Brother   . Coronary artery disease Brother 44       CABD    OBJECTIVE:    Visual Acuity  Right Eye Distance:   Left Eye Distance:   Bilateral Distance:    Right Eye Near:   Left Eye Near:    Bilateral Near:      Vitals:   05/09/20 1102 05/09/20 1103  BP: 121/80   Pulse: 77   Resp: 18   Temp: 99.2 F (37.3 C)   TempSrc: Oral   SpO2: 94%   Weight:  177 lb (80.3 kg)  Height:  5\' 2"  (1.575 m)    General appearance: alert; no distress Eyes: No conjunctival erythema. PERRL; EOMI without discomfort;  Clear watery drainage; lid everted without obvious FB; no obvious fluorescein uptake  Neck: supple Lungs: clear to auscultation bilaterally Heart: regular rate and rhythm Skin: warm and dry Psychological:  alert and cooperative; normal mood and affect   ASSESSMENT & PLAN:  1. Allergic conjunctivitis of both eyes     Meds ordered this encounter  Medications  . Olopatadine HCl 0.2 % SOLN    Sig: Apply 1 drop to eye 1 day or 1 dose for 1 dose.    Dispense:  2.5 mL    Refill:  0  . ofloxacin (OCUFLOX) 0.3 % ophthalmic solution    Sig: Place 1 drop into both eyes 4 (four) times daily.    Dispense:  5 mL    Refill:  0   Patient symptom is likely from allergic conjunctivitis.  Ofloxacin was prescribed to prevent infection.  Pataday was prescribed for itchiness   Discharge instructions Use eye drops as prescribed and to completion Pataday was prescribed for eye itchiness Continue to use Systane for eye dryness Ofloxacin eyedrop was prescribed to prevent infection Follow-up with ophthalmologist or go to ED if symptoms persists such as fever, chills, redness, swelling, eye pain, painful eye movements, vision changes, etc...  Reviewed expectations re: course of current medical issues. Questions answered. Outlined signs and symptoms indicating need for more acute intervention. Patient verbalized understanding. After Visit Summary given.   Emerson Monte, FNP 05/09/20 1128

## 2020-05-14 ENCOUNTER — Other Ambulatory Visit (HOSPITAL_COMMUNITY): Payer: Self-pay | Admitting: Family Medicine

## 2020-05-14 DIAGNOSIS — Z1231 Encounter for screening mammogram for malignant neoplasm of breast: Secondary | ICD-10-CM

## 2020-05-22 DIAGNOSIS — G4733 Obstructive sleep apnea (adult) (pediatric): Secondary | ICD-10-CM | POA: Diagnosis not present

## 2020-06-16 ENCOUNTER — Ambulatory Visit (HOSPITAL_COMMUNITY)
Admission: RE | Admit: 2020-06-16 | Discharge: 2020-06-16 | Disposition: A | Discharge status: Home / Self Care | Payer: PPO | Source: Ambulatory Visit | Attending: Family Medicine | Admitting: Family Medicine

## 2020-06-16 ENCOUNTER — Other Ambulatory Visit: Payer: Self-pay

## 2020-06-16 DIAGNOSIS — Z1231 Encounter for screening mammogram for malignant neoplasm of breast: Secondary | ICD-10-CM

## 2020-06-19 ENCOUNTER — Telehealth: Payer: Self-pay | Admitting: Family Medicine

## 2020-06-19 ENCOUNTER — Other Ambulatory Visit: Payer: Self-pay | Admitting: Family Medicine

## 2020-06-19 MED ORDER — ALPRAZOLAM 0.5 MG PO TABS: ORAL_TABLET | ORAL | 0 refills | Status: DC

## 2020-06-19 NOTE — Telephone Encounter (Signed)
Pt states she only takes valium for muscle spasms and it is about 68 years old. Would like to take xanax for nerves states it has been about 3 years since she had any and hers is out of date so she threw it away when she cleaned out her meds [facility] .

## 2020-06-19 NOTE — Telephone Encounter (Signed)
Medication was sent in as requested 

## 2020-06-19 NOTE — Telephone Encounter (Signed)
Pt called and made a med follow up appt for 08/19 she is needing a refill on her xanax 0.5 mg.   Pt call back number is [phone]

## 2020-06-19 NOTE — Telephone Encounter (Signed)
Nurses Please call patient Her med list has Valium listed on it When I checked drug registry she is not received any nerve medications for quite some time Please clarify what she is asking for and the reason she is using it for thank you ( I.e. need more info)

## 2020-06-19 NOTE — Telephone Encounter (Signed)
Attempted to contact patient twice. No answer; no voicemail available

## 2020-06-20 NOTE — Telephone Encounter (Signed)
Pt.notified

## 2020-07-08 ENCOUNTER — Encounter: Payer: Self-pay | Admitting: Family Medicine

## 2020-07-08 ENCOUNTER — Ambulatory Visit (INDEPENDENT_AMBULATORY_CARE_PROVIDER_SITE_OTHER): Payer: PPO | Admitting: Family Medicine

## 2020-07-08 ENCOUNTER — Other Ambulatory Visit: Payer: Self-pay

## 2020-07-08 VITALS — BP 134/86 | Temp 97.6°F | Wt 181.6 lb

## 2020-07-08 DIAGNOSIS — Z13820 Encounter for screening for osteoporosis: Secondary | ICD-10-CM

## 2020-07-08 DIAGNOSIS — Z79899 Other long term (current) drug therapy: Secondary | ICD-10-CM

## 2020-07-08 DIAGNOSIS — I779 Disorder of arteries and arterioles, unspecified: Secondary | ICD-10-CM

## 2020-07-08 DIAGNOSIS — K219 Gastro-esophageal reflux disease without esophagitis: Secondary | ICD-10-CM | POA: Diagnosis not present

## 2020-07-08 DIAGNOSIS — I10 Essential (primary) hypertension: Secondary | ICD-10-CM | POA: Diagnosis not present

## 2020-07-08 DIAGNOSIS — E781 Pure hyperglyceridemia: Secondary | ICD-10-CM

## 2020-07-08 DIAGNOSIS — E785 Hyperlipidemia, unspecified: Secondary | ICD-10-CM

## 2020-07-08 DIAGNOSIS — F439 Reaction to severe stress, unspecified: Secondary | ICD-10-CM

## 2020-07-08 MED ORDER — HYDROCHLOROTHIAZIDE 25 MG PO TABS: 25.0000 mg | ORAL_TABLET | Freq: Every day | ORAL | 1 refills | Status: DC

## 2020-07-08 MED ORDER — AMLODIPINE BESYLATE 5 MG PO TABS
5.0000 mg | ORAL_TABLET | Freq: Every day | ORAL | 5 refills | Status: DC
Start: 2020-07-08 — End: 2020-10-22

## 2020-07-08 MED ORDER — PRAVASTATIN SODIUM 20 MG PO TABS: 20.0000 mg | ORAL_TABLET | Freq: Every day | ORAL | 1 refills | Status: DC

## 2020-07-08 MED ORDER — PANTOPRAZOLE SODIUM 40 MG PO TBEC: 40.0000 mg | DELAYED_RELEASE_TABLET | Freq: Every day | ORAL | 1 refills | Status: DC

## 2020-07-08 MED ORDER — POTASSIUM CHLORIDE CRYS ER 20 MEQ PO TBCR: 20.0000 meq | EXTENDED_RELEASE_TABLET | Freq: Every day | ORAL | 1 refills | Status: DC

## 2020-07-08 MED ORDER — DULOXETINE HCL 60 MG PO CPEP: 60.0000 mg | ORAL_CAPSULE | Freq: Every day | ORAL | 1 refills | Status: DC

## 2020-07-08 NOTE — Progress Notes (Signed)
Subjective:    Patient ID: Emily [name], female    DOB: 02/10/1951, 69 y.o.   MRN: [id number]  Hypertension This is a chronic problem. Pertinent negatives include no chest pain or shortness of breath. Treatments tried: verapamil, HCTZ. There are no compliance problems.    Patient would like to discuss ongoing lower back pain. Xanax Is not helping with this.  Patient states she is try to do some stretching She has a history of surgery The Cymbalta she takes does help to some degree with the neuropathic pain and also helps with her depression She does take her blood pressure medicines on a regular basis Blood pressure is somewhat higher than what I would like to see. We will talked about switching away from verapamil Patient having some stress related issues partly in her daughter who has drug addiction issues has now moved home also partly having some issues with her husband but that is improving Review of Systems  Constitutional: Negative for activity change, appetite change and fatigue.  HENT: Negative for congestion and rhinorrhea.   Respiratory: Negative for cough and shortness of breath.   Cardiovascular: Negative for chest pain and leg swelling.  Gastrointestinal: Negative for abdominal pain and diarrhea.  Endocrine: Negative for polydipsia and polyphagia.  Skin: Negative for color change.  Neurological: Negative for dizziness and weakness.  Psychiatric/Behavioral: Negative for behavioral problems and confusion.       Objective:   Physical Exam Vitals reviewed.  Constitutional:      General: She is not in acute distress. HENT:     Head: Normocephalic and atraumatic.  Eyes:     General:        Right eye: No discharge.        Left eye: No discharge.  Neck:     Trachea: No tracheal deviation.  Cardiovascular:     Rate and Rhythm: Normal rate and regular rhythm.     Heart sounds: Normal heart sounds. No murmur heard.   Pulmonary:     Effort: Pulmonary effort is  normal. No respiratory distress.     Breath sounds: Normal breath sounds.  Lymphadenopathy:     Cervical: No cervical adenopathy.  Skin:    General: Skin is warm and dry.  Neurological:     Mental Status: She is alert.     Coordination: Coordination normal.  Psychiatric:        Behavior: Behavior normal.       Patient patient finds herself depressed but states that with counseling and taking her medicine she believes she will get better denies being suicidal I have encouraged this patient to do counseling to try to help her with some of her stress related issues I am concerned that without counseling she may get stuck in a rut and not see significant improvement     Assessment & Plan:  1. HTN (hypertension), benign Blood pressure subpar control stop verapamil start amlodipine 5 mg daily continue the diuretic - Basic metabolic panel  2. GERD without esophagitis Reflux decent control continue current measures  3. Carotid artery disease, unspecified laterality, unspecified type ([place]) History of carotid artery disease does need a follow-up carotid Doppler study this was ordered - US Carotid Bilateral  4. Hypertriglyceridemia Hyperlipidemia continue current medication watch diet closely stay active  5. Hyperlipidemia, unspecified hyperlipidemia type See above - Lipid panel  6. Stress Significant stress issues related to her daughter who is a drug abuser having to move back home and not contributing to  the wellbeing of the household plus also having some marital issues with her husband which is doing better now - Ambulatory referral to Psychology  7. High risk medication use Check liver profile - Hepatic function panel  8. Screening for osteoporosis Bone density - DG Bone Density   Hydrocodone was given for occasional use for severe low back pain patient is responsible with her taking it  Patient does use Xanax sparingly for stress does not abuse it I do not feel the  patient is addicted

## 2020-07-10 ENCOUNTER — Telehealth: Payer: Self-pay | Admitting: Family Medicine

## 2020-07-10 MED ORDER — HYDROCODONE-ACETAMINOPHEN 5-325 MG PO TABS: 1.0000 | ORAL_TABLET | Freq: Four times a day (QID) | ORAL | 0 refills | Status: DC | PRN

## 2020-07-10 NOTE — Telephone Encounter (Signed)
Patient notified

## 2020-07-10 NOTE — Telephone Encounter (Signed)
Sent as requested.

## 2020-07-10 NOTE — Telephone Encounter (Signed)
Patient was seen yesterday and she states nothing was sent in for her back pain to [facility]

## 2020-07-10 NOTE — Telephone Encounter (Signed)
Note states hydrocodone given for back pain but do not see where it was sent in. [facility] [place].

## 2020-07-11 DIAGNOSIS — I10 Essential (primary) hypertension: Secondary | ICD-10-CM | POA: Diagnosis not present

## 2020-07-11 DIAGNOSIS — Z79899 Other long term (current) drug therapy: Secondary | ICD-10-CM | POA: Diagnosis not present

## 2020-07-11 DIAGNOSIS — E785 Hyperlipidemia, unspecified: Secondary | ICD-10-CM | POA: Diagnosis not present

## 2020-07-12 LAB — HEPATIC FUNCTION PANEL
ALT: 18 IU/L (ref 0–32)
AST: 16 IU/L (ref 0–40)
Albumin: 4.3 g/dL (ref 3.8–4.8)
Alkaline Phosphatase: 76 IU/L (ref 48–121)
Bilirubin Total: 0.4 mg/dL (ref 0.0–1.2)
Bilirubin, Direct: 0.12 mg/dL (ref 0.00–0.40)
Total Protein: 6.7 g/dL (ref 6.0–8.5)

## 2020-07-12 LAB — LIPID PANEL
Chol/HDL Ratio: 4.7 ratio — ABNORMAL HIGH (ref 0.0–4.4)
Cholesterol, Total: 149 mg/dL (ref 100–199)
HDL: 32 mg/dL — ABNORMAL LOW (ref >39)
LDL Chol Calc (NIH): 55 mg/dL (ref 0–99)
Triglycerides: 408 mg/dL — ABNORMAL HIGH (ref 0–149)
VLDL Cholesterol Cal: 62 mg/dL — ABNORMAL HIGH (ref 5–40)

## 2020-07-12 LAB — BASIC METABOLIC PANEL
BUN/Creatinine Ratio: 16 (ref 12–28)
BUN: 12 mg/dL (ref 8–27)
CO2: 24 mmol/L (ref 20–29)
Calcium: 9.1 mg/dL (ref 8.7–10.3)
Chloride: 103 mmol/L (ref 96–106)
Creatinine, Ser: 0.74 mg/dL (ref 0.57–1.00)
GFR calc Af Amer: 97 mL/min/{1.73_m2} (ref >59)
GFR calc non Af Amer: 84 mL/min/{1.73_m2} (ref >59)
Glucose: 102 mg/dL — ABNORMAL HIGH (ref 65–99)
Potassium: 4.1 mmol/L (ref 3.5–5.2)
Sodium: 140 mmol/L (ref 134–144)

## 2020-07-14 ENCOUNTER — Other Ambulatory Visit: Payer: Self-pay

## 2020-07-14 ENCOUNTER — Ambulatory Visit (HOSPITAL_COMMUNITY)
Admission: RE | Admit: 2020-07-14 | Discharge: 2020-07-14 | Disposition: A | Discharge status: Home / Self Care | Payer: PPO | Source: Ambulatory Visit | Attending: Family Medicine | Admitting: Family Medicine

## 2020-07-14 ENCOUNTER — Encounter: Payer: Self-pay | Admitting: Family Medicine

## 2020-07-14 DIAGNOSIS — Z780 Asymptomatic menopausal state: Secondary | ICD-10-CM | POA: Insufficient documentation

## 2020-07-14 DIAGNOSIS — Z13820 Encounter for screening for osteoporosis: Secondary | ICD-10-CM | POA: Diagnosis not present

## 2020-07-14 DIAGNOSIS — R29890 Loss of height: Secondary | ICD-10-CM | POA: Diagnosis not present

## 2020-07-15 ENCOUNTER — Telehealth: Payer: Self-pay | Admitting: Family Medicine

## 2020-07-15 NOTE — Telephone Encounter (Signed)
Please advise thank you

## 2020-07-15 NOTE — Telephone Encounter (Signed)
Pt's grandson was with them Friday and Saturday and has now tested positive for COVID yesterday. She would like to know if and when they should get tested. They are currently having no symptoms and had both vaccines.   She states grandson was going today to get covid vaccine.

## 2020-07-15 NOTE — Telephone Encounter (Signed)
In these situations we recommend waiting approximately 6 days so therefore consider testing on Friday sooner if any symptoms the likelihood of breakthrough infection is low thank goodness if any problems let us know

## 2020-07-15 NOTE — Telephone Encounter (Signed)
Pt contacted and verbalized understanding.  

## 2020-07-22 ENCOUNTER — Ambulatory Visit: Payer: PPO | Admitting: Family Medicine

## 2020-07-28 ENCOUNTER — Ambulatory Visit (HOSPITAL_COMMUNITY)
Admission: RE | Admit: 2020-07-28 | Discharge: 2020-07-28 | Disposition: A | Discharge status: Home / Self Care | Payer: PPO | Source: Ambulatory Visit | Attending: Family Medicine | Admitting: Family Medicine

## 2020-07-28 ENCOUNTER — Other Ambulatory Visit: Payer: Self-pay

## 2020-07-28 DIAGNOSIS — I6523 Occlusion and stenosis of bilateral carotid arteries: Secondary | ICD-10-CM | POA: Diagnosis not present

## 2020-07-28 DIAGNOSIS — I779 Disorder of arteries and arterioles, unspecified: Secondary | ICD-10-CM | POA: Diagnosis not present

## 2020-07-31 DIAGNOSIS — M5416 Radiculopathy, lumbar region: Secondary | ICD-10-CM | POA: Diagnosis not present

## 2020-07-31 DIAGNOSIS — M5412 Radiculopathy, cervical region: Secondary | ICD-10-CM | POA: Diagnosis not present

## 2020-08-06 ENCOUNTER — Other Ambulatory Visit: Payer: Self-pay | Admitting: Family Medicine

## 2020-08-06 ENCOUNTER — Telehealth: Payer: Self-pay | Admitting: Family Medicine

## 2020-08-06 DIAGNOSIS — F439 Reaction to severe stress, unspecified: Secondary | ICD-10-CM

## 2020-08-06 MED ORDER — ALPRAZOLAM 0.5 MG PO TABS: ORAL_TABLET | ORAL | 0 refills | Status: DC

## 2020-08-06 NOTE — Telephone Encounter (Signed)
Referral behavioral health for counseling or alternative option is [facility] in [place]

## 2020-08-06 NOTE — Telephone Encounter (Signed)
Called pt to get more information about the referral request. Asked who she wanted to see and she said who every dr scott recommended. States she has been upset off and on for 2 months and talked with dr Nicki Reaper about it at last visit. Prescribed xanax 0.5mg  takes one half to one bid and states it does help. Would like a refill [facility] to [facility] in [place]. Still has some but does not want to run out over the weekend. I asked if she was having any suicidal thoughts and she said about halfway. I asked if she had a plan to hurt herself and she said oh no nothing like that. I advised her if she was to start to have any suicidal thoughts she should go to ED right away and she verbalized understanding.

## 2020-08-06 NOTE — Telephone Encounter (Signed)
Pt is requesting referral for her nerves being torn up and crying all the time.

## 2020-08-06 NOTE — Telephone Encounter (Signed)
Referral ordered in Epic. Refill sent to pharmacy by Dr Nicki Reaper  Left message to return call to notify patient.

## 2020-08-06 NOTE — Telephone Encounter (Signed)
Patient notified

## 2020-08-13 DIAGNOSIS — M5416 Radiculopathy, lumbar region: Secondary | ICD-10-CM | POA: Diagnosis not present

## 2020-08-13 DIAGNOSIS — M5126 Other intervertebral disc displacement, lumbar region: Secondary | ICD-10-CM | POA: Diagnosis not present

## 2020-08-13 DIAGNOSIS — M47812 Spondylosis without myelopathy or radiculopathy, cervical region: Secondary | ICD-10-CM | POA: Diagnosis not present

## 2020-08-13 DIAGNOSIS — M5412 Radiculopathy, cervical region: Secondary | ICD-10-CM | POA: Diagnosis not present

## 2020-08-14 DIAGNOSIS — M5416 Radiculopathy, lumbar region: Secondary | ICD-10-CM | POA: Diagnosis not present

## 2020-08-18 ENCOUNTER — Ambulatory Visit: Payer: PPO | Admitting: Orthopedic Surgery

## 2020-08-21 ENCOUNTER — Other Ambulatory Visit: Payer: Self-pay

## 2020-08-21 ENCOUNTER — Ambulatory Visit: Payer: PPO | Admitting: Orthopedic Surgery

## 2020-08-21 ENCOUNTER — Encounter: Payer: Self-pay | Admitting: Orthopedic Surgery

## 2020-08-21 VITALS — BP 151/78 | HR 74 | Ht 60.0 in | Wt 175.0 lb

## 2020-08-21 DIAGNOSIS — M79676 Pain in unspecified toe(s): Secondary | ICD-10-CM | POA: Diagnosis not present

## 2020-08-21 DIAGNOSIS — M7631 Iliotibial band syndrome, right leg: Secondary | ICD-10-CM

## 2020-08-21 DIAGNOSIS — B351 Tinea unguium: Secondary | ICD-10-CM | POA: Diagnosis not present

## 2020-08-21 NOTE — Progress Notes (Signed)
Chief Complaint  Patient presents with  . Knee Pain    right knee- s/p TKA wants an injection??  left knee- feels something at distal end incision   69 year old female status post right total knee   I gave her an injection iliotibial band for tightness and pain did well.  She says she think it may have eventually released or torn as she felt a ripping sensation and the pain got better but she would like another injection A steroid injection was performed at [place] using 1% plain Lidocaine and 40 mg of DEPOMEDROL . This was well tolerated.  Encounter Diagnosis  Name Primary?  . Iliotibial band syndrome of right side Yes   FU PRN

## 2020-08-21 NOTE — Patient Instructions (Signed)

## 2020-09-03 ENCOUNTER — Ambulatory Visit (HOSPITAL_COMMUNITY): Payer: PPO | Attending: Neurosurgery | Admitting: Physical Therapy

## 2020-09-03 ENCOUNTER — Other Ambulatory Visit: Payer: Self-pay

## 2020-09-03 ENCOUNTER — Encounter (HOSPITAL_COMMUNITY): Payer: Self-pay | Admitting: Physical Therapy

## 2020-09-03 DIAGNOSIS — M5416 Radiculopathy, lumbar region: Secondary | ICD-10-CM

## 2020-09-03 DIAGNOSIS — M6281 Muscle weakness (generalized): Secondary | ICD-10-CM | POA: Diagnosis not present

## 2020-09-03 NOTE — Therapy (Signed)
[place], [place], [address] Phone: [phone]   Fax:  [phone]  Physical Therapy Evaluation  Patient Details  Name: Emily [name] MRN: [id number] Date of Birth: 03-12-1951 Referring Provider (PT): Deri Fuelling   Encounter Date: 09/03/2020   PT End of Session - 09/03/20 1224    Visit Number 1    Number of Visits 8    Date for PT Re-Evaluation 10/03/20    Authorization Type MCR    Progress Note Due on Visit 8    PT Start Time 1045    PT Stop Time 1130    PT Time Calculation (min) 45 min    Activity Tolerance Patient tolerated treatment well    Behavior During Therapy [facility] for tasks assessed/performed           Past Medical History:  Diagnosis Date   Acid reflux    Anxiety    Arthritis    CHF (congestive heart failure) ([place])    90's swelling body-no breathing problems at that time-no cardiac -in dr s Wolfgang [place] office [name] give lasix and potassium   Complication of anesthesia    History of being told she was allergic to something in 1980 no problems since   Depression    Fibromyalgia    Fibromyalgia 09/10/2019   History of blood transfusion reaction 1978   History of hiatal hernia    HNP (herniated nucleus pulposus)    Hypertension    Does not see a cardiologist no stress or echo   Joint pain    Nerve pain    right leg    Ruptured lumbar disc    Sleep apnea    Uses CPAP every night setting at 7 mm/Hg    Past Surgical History:  Procedure Laterality Date   BACK SURGERY     x3   BLADDER REPAIR     tear during during birth of child   CARPAL TUNNEL RELEASE Bilateral    CESAREAN SECTION     CHOLECYSTECTOMY     gallbladder disease; stayed sick   COLONOSCOPY N/A 04/28/2016   Procedure: COLONOSCOPY;  Surgeon: [name], MD;  [facility];  Service: Endoscopy;  Laterality: N/A;   ESOPHAGOGASTRODUODENOSCOPY N/A 04/28/2016   Procedure:  ESOPHAGOGASTRODUODENOSCOPY (EGD);  Surgeon: [name], MD;  [facility];  Service: Endoscopy;  Laterality: N/A;  12:00   GALLBLADDER SURGERY     knee right (torn Cart)     REPLACEMENT TOTAL KNEE     left   RIGHT/LEFT HEART CATH AND CORONARY ANGIOGRAPHY N/A 01/23/2020   Procedure: RIGHT/LEFT HEART CATH AND CORONARY ANGIOGRAPHY;  Surgeon: [name], MD;  Location: [place] [facility];  Service: Cardiovascular;  Laterality: N/A;   ROTATOR CUFF REPAIR Bilateral    right and left   SPINAL FUSION     TOTAL KNEE ARTHROPLASTY Right 12/29/2017   Procedure: TOTAL KNEE ARTHROPLASTY-right knee;  Surgeon: [name], MD;  Location: [facility];  Service: Orthopedics;  Laterality: Right;   TUBAL LIGATION     vein right leg removed     wrist ganglion cyst left      There were no vitals filed for this visit.    Subjective Assessment - 09/03/20 1113    Subjective Pt states that she has had three surgeries on her back, the last surgery was in the 1970's.  In June she was picking beans in the garden and bent over to  far and felt a stabbing pain in her back where she could not raise up for awhle, she had a MRI which showed torn tissue in her back.  She was sent to therapy at the end of August and has just now been able to get into therapy.  At this point she is 50% better but she states that the improvement has seemed to have stopped.    Pertinent History TKR B, 3 back surgeries all were fusions, carpal tunnel surgery    Limitations House hold activities;Lifting;Standing;Walking;Sitting    How long can you sit comfortably? increased pain almost immediately;    How long can you stand comfortably? on cement almost immediately    How long can you walk comfortably? 15 minutes    Patient Stated Goals less pain    Pain Onset More than a month ago              [facility] PT Assessment - 09/03/20 0001      Assessment   Medical Diagnosis lumbar radiculopathy     Referring  Provider (PT) Deri Fuelling    Onset Date/Surgical Date 06/29/20   acute exacerbation after working in the garden    Prior Therapy yes but not for this episode       Precautions   Precautions None      Restrictions   Weight Bearing Restrictions No      Balance Screen   Has the patient fallen in the past 6 months No      [place] residence    Home Access Stairs to enter    Entrance Stairs-Number of Steps --   able to go reciprocal up; one at a time going down      Prior Function   Level of Independence Independent    Vocation Retired    Leisure garden, sew       Cognition   Overall Cognitive Status Within Functional Limits for tasks assessed      Observation/Other Assessments   Focus on Therapeutic Outcomes (FOTO)  54, affected 46%       Functional Tests   Functional tests Single leg stance      Single Leg Stance   Comments RT: 26: ; LT 35       ROM / Strength   AROM / PROM / Strength AROM;Strength      AROM   AROM Assessment Site Lumbar    Lumbar Flexion fingers just below knees   hip pivots very flat back ; reps increase pain    Lumbar Extension 15   reps increases pain      Strength   Strength Assessment Site Hip;Knee;Ankle    Right/Left Hip Right;Left    Right Hip Flexion 4+/5    Right Hip Extension 5/5    Right Hip ABduction 4/5    Left Hip Flexion 4+/5    Left Hip Extension 5/5    Left Hip ABduction 5/5    Right/Left Knee Right;Left    Right Knee Flexion 5/5    Right Knee Extension 4+/5    Left Knee Flexion 5/5    Left Knee Extension 5/5    Right/Left Ankle Right;Left    Right Ankle Dorsiflexion 4/5    Left Ankle Dorsiflexion 4-/5      Palpation   Palpation comment very tight paraspinal mm                      Objective measurements  completed on examination: See above findings.       [facility] Adult PT Treatment/Exercise - 09/03/20 0001      Exercises   Exercises Lumbar      Lumbar Exercises:  Stretches   Active Hamstring Stretch Right;Left;3 reps;30 seconds    Single Knee to Chest Stretch Left;3 reps;30 seconds    Lower Trunk Rotation 3 reps      Lumbar Exercises: Supine   Ab Set 5 reps                  PT Education - 09/03/20 1223    Education Details HEp    Person(s) Educated Patient    Methods Explanation    Comprehension Verbalized understanding            PT Short Term Goals - 09/03/20 1233      PT SHORT TERM GOAL #1   Title Pt to be I in HEP to allow pt maximum back pain level to be no greater than a 6/10    Time 2    Period Weeks    Status New    Target Date 09/17/20      PT SHORT TERM GOAL #2   Title Pt to be sleeping at least 4 hours at a time.    Time 2    Period Weeks    Status New      PT SHORT TERM GOAL #3   Title Pt to be able to sit for 30 minutes without increased back  pain to return to sewing    Time 2    Period Weeks      PT SHORT TERM GOAL #4   Title Pt to no longer be having radicular sx to demonstrate decreased nerve irritation.    Time 2    Period Weeks    Status New             PT Long Term Goals - 09/03/20 1236      PT LONG TERM GOAL #1   Title Pt to be I in advance HEP to allow pt maximal back pain to be no greater than a 3/10    Time 4    Period Weeks    Status New    Target Date 10/03/20      PT LONG TERM GOAL #2   Title Pt to be able to stand for 30 minutes to be able to make a meal without increased back pain.    Time 4    Period Weeks    Status New      PT LONG TERM GOAL #3   Title PT LE strength to be at least a 4+/5 to allow pt to feel confident coming down steps in a reciprocal mannner.    Time 4    Period Weeks    Status New      PT LONG TERM GOAL #4   Title PT to be able to sit for an hour without increased back pain for eating, sewing in comfort.    Time 4    Period Weeks    Status New                  Plan - 09/03/20 1226    Clinical Impression Statement Ms. Hewins is a  69 yo female who has had chronic LBP for years.  She has a history  of having 3 Low back surgeries.  Ms. Chicoine has had an exacerbation of her low back pain  after picking vegtables out of her garden in late July when she bent down to pick the vegtables off the stem and had such searing pain that she could not raise herself up and had to lower herself to the ground.  Her pain has improved 50% since then but the improvement has seemed to plataued therefore she is being referred to skilled PT>    Personal Factors and Comorbidities Fitness;Past/Current Experience;Time since onset of injury/illness/exacerbation    Examination-Activity Limitations Bed Mobility;Bend;Carry;Dressing;Lift;Locomotion Level;Stand;Stairs;Squat;Sleep;Sit    Examination-Participation Restrictions Cleaning;Community Activity;Driving;Laundry;Meal Prep;Shop    Stability/Clinical Decision Making Evolving/Moderate complexity    Clinical Decision Making Moderate    Rehab Potential Good    PT Frequency 2x / week    PT Duration 4 weeks    PT Treatment/Interventions Patient/family education;Manual techniques;Functional mobility training;Therapeutic activities;Therapeutic exercise    PT Next Visit Plan begin stabilization exercises to include sitting posture, scapular retraction, bent knee lift, clam and bridge progress as able, manual to address B paraspinal spasms and pain    PT Home Exercise Plan 9/29:  Lt knee to chest, LTR, active hamstring stretch, ab set           Patient will benefit from skilled therapeutic intervention in order to improve the following deficits and impairments:  Decreased activity tolerance, Decreased balance, Decreased range of motion, Decreased strength, Difficulty walking, Hypomobility, Increased fascial restricitons, Increased muscle spasms, Impaired flexibility, Pain  Visit Diagnosis: Radiculopathy, lumbar region - Plan: PT plan of care cert/re-cert  Muscle weakness (generalized) - Plan: PT plan of care  cert/re-cert     Problem List Patient Active Problem List   Diagnosis Date Noted   Exertional dyspnea    Chest pain of uncertain etiology    Fibromyalgia 09/10/2019   S/P TKR (total knee replacement), right 12/29/17 01/10/2018   Primary osteoarthritis of right knee    GERD without esophagitis 03/28/2017   Obesity (BMI 30-39.9) 03/28/2017   Obesity 10/06/2016   Lumbar foraminal stenosis 06/04/2016   Carpal tunnel syndrome, bilateral 02/07/2014   S/P lumbar spinal fusion 02/07/2014   Obstructive sleep apnea 02/04/2014   Hand arthritis 10/15/2013   HTN (hypertension), benign 08/24/2013   Hypertriglyceridemia 08/24/2013   Iliotibial band tendonitis 02/08/2013   Trigger finger, acquired 02/08/2013   Low back pain 06/22/2011   HNP (herniated nucleus pulposus) 06/22/2011   HIP PAIN, RIGHT 12/15/2010   LUMBOSACRAL SPONDYLOSIS WITHOUT MYELOPATHY 12/15/2010   SPINAL STENOSIS, LUMBAR 12/15/2010   KNEE REPLACEMENT, HX OF 02/26/2010   DERANGEMENT MENISCUS 07/03/2009   KNEE, ARTHRITIS, DEGEN./OSTEO 05/22/2009   KNEE PAIN 05/22/2009   ANSERINE BURSITIS 05/22/2009   TRIGGER FINGER 09/23/2008    Rayetta Humphrey, PT CLT [phone] 09/03/2020, 12:41 PM  [place], [place], [address] Phone: [phone]   Fax:  [phone]  Name: ISIDORA LAHAM MRN: [id number] Date of Birth: [birth year]

## 2020-09-08 ENCOUNTER — Encounter (HOSPITAL_COMMUNITY): Payer: PPO | Admitting: Physical Therapy

## 2020-09-10 ENCOUNTER — Ambulatory Visit (HOSPITAL_COMMUNITY): Payer: PPO | Attending: Neurosurgery | Admitting: Physical Therapy

## 2020-09-10 ENCOUNTER — Other Ambulatory Visit: Payer: Self-pay

## 2020-09-10 DIAGNOSIS — M6281 Muscle weakness (generalized): Secondary | ICD-10-CM

## 2020-09-10 DIAGNOSIS — M5416 Radiculopathy, lumbar region: Secondary | ICD-10-CM | POA: Diagnosis not present

## 2020-09-10 NOTE — Therapy (Signed)
[place], [place], [address] Phone: [phone]   Fax:  [phone]  Physical Therapy Treatment  Patient Details  Name: Emily [name] MRN: [id number] Date of Birth: February 06, 1951 Referring Provider (PT): Deri Fuelling   Encounter Date: 09/10/2020   PT End of Session - 09/10/20 0855    Visit Number 2    Number of Visits 8    Date for PT Re-Evaluation 10/03/20    Authorization Type MCR    Progress Note Due on Visit 8    PT Start Time 0836    PT Stop Time 0915    PT Time Calculation (min) 39 min    Activity Tolerance Patient tolerated treatment well    Behavior During Therapy [facility] for tasks assessed/performed           Past Medical History:  Diagnosis Date  . Acid reflux   . Anxiety   . Arthritis   . CHF (congestive heart failure) ([facility])    90's swelling body-no breathing problems at that time-no cardiac -in dr s Wolfgang [place] office [place] give lasix and potassium  . Complication of anesthesia    History of being told she was allergic to something in 1980 no problems since  . Depression   . Fibromyalgia   . Fibromyalgia 09/10/2019  . History of blood transfusion reaction 1978  . History of hiatal hernia   . HNP (herniated nucleus pulposus)   . Hypertension    Does not see a cardiologist no stress or echo  . Joint pain   . Nerve pain    right leg   . Ruptured lumbar disc   . Sleep apnea    Uses CPAP every night setting at 7 mm/Hg    Past Surgical History:  Procedure Laterality Date  . BACK SURGERY     x3  . BLADDER REPAIR     tear during during birth of child  . CARPAL TUNNEL RELEASE Bilateral   . CESAREAN SECTION    . CHOLECYSTECTOMY     gallbladder disease; stayed sick  . COLONOSCOPY N/A 04/28/2016   Procedure: COLONOSCOPY;  Surgeon: [name], MD;  [facility];  Service: Endoscopy;  Laterality: N/A;  . ESOPHAGOGASTRODUODENOSCOPY N/A 04/28/2016   Procedure:  ESOPHAGOGASTRODUODENOSCOPY (EGD);  Surgeon: [name], MD;  [facility];  Service: Endoscopy;  Laterality: N/A;  12:00  . GALLBLADDER SURGERY    . knee right (torn Cart)    . REPLACEMENT TOTAL KNEE     left  . RIGHT/LEFT HEART CATH AND CORONARY ANGIOGRAPHY N/A 01/23/2020   Procedure: RIGHT/LEFT HEART CATH AND CORONARY ANGIOGRAPHY;  Surgeon: [name], MD;  Location: [place] [facility];  Service: Cardiovascular;  Laterality: N/A;  . ROTATOR CUFF REPAIR Bilateral    right and left  . SPINAL FUSION    . TOTAL KNEE ARTHROPLASTY Right 12/29/2017   Procedure: TOTAL KNEE ARTHROPLASTY-right knee;  Surgeon: [name], MD;  Location: [facility];  Service: Orthopedics;  Laterality: Right;  . TUBAL LIGATION    . vein right leg removed    . wrist ganglion cyst left      There were no vitals filed for this visit.   Subjective Assessment - 09/10/20 0844    Subjective Pt states constant pain since she left last visit.  STates Rt side hurts the worst.  currenlty 8/10 pain.  Reports complaince with HEP.    Currently in Pain? Yes  Pain Score 8     Pain Location Back    Pain Orientation Right;Left    Pain Descriptors / Indicators Aching;Burning    Pain Type Chronic pain                             [facility] Adult PT Treatment/Exercise - 09/10/20 0001      Lumbar Exercises: Stretches   Active Hamstring Stretch Right;Left;3 reps;30 seconds    Single Knee to Chest Stretch Left;3 reps;30 seconds    Lower Trunk Rotation 5 reps      Lumbar Exercises: Supine   Ab Set 10 reps;5 seconds    Clam 10 reps    Clam Limitations with core stab    Bent Knee Raise 10 reps    Bent Knee Raise Limitations with core stab    Bridge 10 reps    Straight Leg Raise 10 reps    Straight Leg Raises Limitations with core stab                  PT Education - 09/10/20 0854    Education Details reviewed goals, HEP and POC moving forward.    Person(s) Educated Patient     Methods Explanation;Demonstration;Tactile cues;Verbal cues    Comprehension Verbalized understanding;Returned demonstration;Verbal cues required;Tactile cues required;Need further instruction            PT Short Term Goals - 09/10/20 0845      PT SHORT TERM GOAL #1   Title Pt to be I in HEP to allow pt maximum back pain level to be no greater than a 6/10    Time 2    Period Weeks    Status On-going    Target Date 09/17/20      PT SHORT TERM GOAL #2   Title Pt to be sleeping at least 4 hours at a time.    Time 2    Period Weeks    Status On-going      PT SHORT TERM GOAL #3   Title Pt to be able to sit for 30 minutes without increased back  pain to return to sewing    Time 2    Period Weeks    Status On-going      PT SHORT TERM GOAL #4   Title Pt to no longer be having radicular sx to demonstrate decreased nerve irritation.    Time 2    Period Weeks    Status On-going             PT Long Term Goals - 09/10/20 0846      PT LONG TERM GOAL #1   Title Pt to be I in advance HEP to allow pt maximal back pain to be no greater than a 3/10    Time 4    Period Weeks    Status On-going      PT LONG TERM GOAL #2   Title Pt to be able to stand for 30 minutes to be able to make a meal without increased back pain.    Time 4    Period Weeks    Status On-going      PT LONG TERM GOAL #3   Title PT LE strength to be at least a 4+/5 to allow pt to feel confident coming down steps in a reciprocal mannner.    Time 4    Period Weeks    Status On-going  PT LONG TERM GOAL #4   Title PT to be able to sit for an hour without increased back pain for eating, sewing in comfort.    Time 4    Period Weeks    Status On-going                 Plan - 09/10/20 0930    Clinical Impression Statement Reveiwed goals, HEP and pOC moving forward.  Pt able to demonstrate correct abdominal isometrics without holding her breath.  Progressed on to LE movements with core stabilization  and worked on seated postural exercises.  Pt with cues to complete these without moving trunk.  Pt able to complete all exercises without c/o pain and no change of symptoms at EOS.    Personal Factors and Comorbidities Fitness;Past/Current Experience;Time since onset of injury/illness/exacerbation    Examination-Activity Limitations Bed Mobility;Bend;Carry;Dressing;Lift;Locomotion Level;Stand;Stairs;Squat;Sleep;Sit    Examination-Participation Restrictions Cleaning;Community Activity;Driving;Laundry;Meal Prep;Shop    Stability/Clinical Decision Making Evolving/Moderate complexity    Rehab Potential Good    PT Frequency 2x / week    PT Duration 4 weeks    PT Treatment/Interventions Patient/family education;Manual techniques;Functional mobility training;Therapeutic activities;Therapeutic exercise    PT Next Visit Plan progress stabilization exercises.  Manual to address B paraspinal spasms and pain    PT Home Exercise Plan 9/29:  Lt knee to chest, LTR, active hamstring stretch, ab set           Patient will benefit from skilled therapeutic intervention in order to improve the following deficits and impairments:  Decreased activity tolerance, Decreased balance, Decreased range of motion, Decreased strength, Difficulty walking, Hypomobility, Increased fascial restricitons, Increased muscle spasms, Impaired flexibility, Pain  Visit Diagnosis: Radiculopathy, lumbar region  Muscle weakness (generalized)     Problem List Patient Active Problem List   Diagnosis Date Noted  . Exertional dyspnea   . Chest pain of uncertain etiology   . Fibromyalgia 09/10/2019  . S/P TKR (total knee replacement), right 12/29/17 01/10/2018  . Primary osteoarthritis of right knee   . GERD without esophagitis 03/28/2017  . Obesity (BMI 30-39.9) 03/28/2017  . Obesity 10/06/2016  . Lumbar foraminal stenosis 06/04/2016  . Carpal tunnel syndrome, bilateral 02/07/2014  . S/P lumbar spinal fusion 02/07/2014  .  Obstructive sleep apnea 02/04/2014  . Hand arthritis 10/15/2013  . HTN (hypertension), benign 08/24/2013  . Hypertriglyceridemia 08/24/2013  . Iliotibial band tendonitis 02/08/2013  . Trigger finger, acquired 02/08/2013  . Low back pain 06/22/2011  . HNP (herniated nucleus pulposus) 06/22/2011  . HIP PAIN, RIGHT 12/15/2010  . LUMBOSACRAL SPONDYLOSIS WITHOUT MYELOPATHY 12/15/2010  . SPINAL STENOSIS, LUMBAR 12/15/2010  . KNEE REPLACEMENT, HX OF 02/26/2010  . DERANGEMENT MENISCUS 07/03/2009  . KNEE, ARTHRITIS, DEGEN./OSTEO 05/22/2009  . KNEE PAIN 05/22/2009  . ANSERINE BURSITIS 05/22/2009  . TRIGGER FINGER 09/23/2008   Teena Irani, PTA/CLT [phone]  Teena Irani 09/10/2020, 9:30 AM  [place], [place], [address] Phone: [phone]   Fax:  [phone]  Name: Emily [name] MRN: [id number] Date of Birth: 03-02-1951

## 2020-09-12 ENCOUNTER — Encounter (HOSPITAL_COMMUNITY): Payer: Self-pay | Admitting: Physical Therapy

## 2020-09-12 ENCOUNTER — Other Ambulatory Visit: Payer: Self-pay

## 2020-09-12 ENCOUNTER — Ambulatory Visit (HOSPITAL_COMMUNITY): Payer: PPO | Admitting: Physical Therapy

## 2020-09-12 DIAGNOSIS — M5416 Radiculopathy, lumbar region: Secondary | ICD-10-CM

## 2020-09-12 DIAGNOSIS — M6281 Muscle weakness (generalized): Secondary | ICD-10-CM

## 2020-09-12 NOTE — Therapy (Signed)
[place], [place], [address] Phone: [phone]   Fax:  [phone]  Physical Therapy Treatment  Patient Details  Name: Emily [name] MRN: [id number] Date of Birth: 04/11/1951 Referring Provider (PT): Deri Fuelling   Encounter Date: 09/12/2020   PT End of Session - 09/12/20 0826    Visit Number 3    Number of Visits 8    Date for PT Re-Evaluation 10/03/20    Authorization Type MCR    Progress Note Due on Visit 8    PT Start Time 0830    PT Stop Time 0910    PT Time Calculation (min) 40 min    Activity Tolerance Patient tolerated treatment well    Behavior During Therapy [facility] for tasks assessed/performed           Past Medical History:  Diagnosis Date   Acid reflux    Anxiety    Arthritis    CHF (congestive heart failure) ([place])    90's swelling body-no breathing problems at that time-no cardiac -in dr s Wolfgang [place] office [name] give lasix and potassium   Complication of anesthesia    History of being told she was allergic to something in 1980 no problems since   Depression    Fibromyalgia    Fibromyalgia 09/10/2019   History of blood transfusion reaction 1978   History of hiatal hernia    HNP (herniated nucleus pulposus)    Hypertension    Does not see a cardiologist no stress or echo   Joint pain    Nerve pain    right leg    Ruptured lumbar disc    Sleep apnea    Uses CPAP every night setting at 7 mm/Hg    Past Surgical History:  Procedure Laterality Date   BACK SURGERY     x3   BLADDER REPAIR     tear during during birth of child   CARPAL TUNNEL RELEASE Bilateral    CESAREAN SECTION     CHOLECYSTECTOMY     gallbladder disease; stayed sick   COLONOSCOPY N/A 04/28/2016   Procedure: COLONOSCOPY;  Surgeon: [name], MD;  [facility];  Service: Endoscopy;  Laterality: N/A;   ESOPHAGOGASTRODUODENOSCOPY N/A 04/28/2016   Procedure:  ESOPHAGOGASTRODUODENOSCOPY (EGD);  Surgeon: [name], MD;  [facility];  Service: Endoscopy;  Laterality: N/A;  12:00   GALLBLADDER SURGERY     knee right (torn Cart)     REPLACEMENT TOTAL KNEE     left   RIGHT/LEFT HEART CATH AND CORONARY ANGIOGRAPHY N/A 01/23/2020   Procedure: RIGHT/LEFT HEART CATH AND CORONARY ANGIOGRAPHY;  Surgeon: [name], MD;  Location: [place] [facility];  Service: Cardiovascular;  Laterality: N/A;   ROTATOR CUFF REPAIR Bilateral    right and left   SPINAL FUSION     TOTAL KNEE ARTHROPLASTY Right 12/29/2017   Procedure: TOTAL KNEE ARTHROPLASTY-right knee;  Surgeon: [name], MD;  Location: [facility];  Service: Orthopedics;  Laterality: Right;   TUBAL LIGATION     vein right leg removed     wrist ganglion cyst left      There were no vitals filed for this visit.   Subjective Assessment - 09/12/20 0828    Subjective PT states that she has been doing her exercises; she has no concerns.    Pertinent History TKR B, 3 back surgeries all were fusions, carpal tunnel surgery    Limitations  House hold activities;Lifting;Standing;Walking;Sitting    How long can you sit comfortably? increased pain almost immediately;    How long can you stand comfortably? on cement almost immediately    How long can you walk comfortably? 15 minutes    Currently in Pain? Yes    Pain Score 5     Pain Location Back    Pain Orientation Lower;Right   Rt greater than LT   Pain Descriptors / Indicators Aching    Pain Type Chronic pain    Pain Onset More than a month ago    Pain Frequency Constant    Aggravating Factors  activity    Pain Relieving Factors resting    Effect of Pain on Daily Activities limits                             [facility] Adult PT Treatment/Exercise - 09/12/20 0001      Exercises   Exercises Lumbar      Lumbar Exercises: Stretches   Active Hamstring Stretch Right;Left;3 reps;30 seconds    Single Knee to  Chest Stretch Left;3 reps;30 seconds    Lower Trunk Rotation 5 reps    Piriformis Stretch Right;Left;2 reps      Lumbar Exercises: Standing   Heel Raises 10 reps    Functional Squats 10 reps    Scapular Retraction Strengthening;Both;10 reps    Theraband Level (Scapular Retraction) Level 3 (Green)    Row Strengthening;Both;10 reps;Theraband    Shoulder Extension Strengthening;Both;10 reps;Theraband    Other Standing Lumbar Exercises side stepping monster walk, single leg stance x 3 B     Other Standing Lumbar Exercises paloff w/green tband x 10 B       Lumbar Exercises: Seated   Sit to Stand 10 reps                    PT Short Term Goals - 09/10/20 0845      PT SHORT TERM GOAL #1   Title Pt to be I in HEP to allow pt maximum back pain level to be no greater than a 6/10    Time 2    Period Weeks    Status On-going    Target Date 09/17/20      PT SHORT TERM GOAL #2   Title Pt to be sleeping at least 4 hours at a time.    Time 2    Period Weeks    Status On-going      PT SHORT TERM GOAL #3   Title Pt to be able to sit for 30 minutes without increased back  pain to return to sewing    Time 2    Period Weeks    Status On-going      PT SHORT TERM GOAL #4   Title Pt to no longer be having radicular sx to demonstrate decreased nerve irritation.    Time 2    Period Weeks    Status On-going             PT Long Term Goals - 09/10/20 0846      PT LONG TERM GOAL #1   Title Pt to be I in advance HEP to allow pt maximal back pain to be no greater than a 3/10    Time 4    Period Weeks    Status On-going      PT LONG TERM GOAL #2   Title Pt to  be able to stand for 30 minutes to be able to make a meal without increased back pain.    Time 4    Period Weeks    Status On-going      PT LONG TERM GOAL #3   Title PT LE strength to be at least a 4+/5 to allow pt to feel confident coming down steps in a reciprocal mannner.    Time 4    Period Weeks    Status  On-going      PT LONG TERM GOAL #4   Title PT to be able to sit for an hour without increased back pain for eating, sewing in comfort.    Time 4    Period Weeks    Status On-going                 Plan - 09/12/20 0847    Clinical Impression Statement Progressed pt to standing stabilization exercises with good form noted after verbal cuing for proper technique. Pt will continue to benefit from skilled PT to improve corea and LE stength to improve functional ability.    Personal Factors and Comorbidities Fitness;Past/Current Experience;Time since onset of injury/illness/exacerbation    Examination-Activity Limitations Bed Mobility;Bend;Carry;Dressing;Lift;Locomotion Level;Stand;Stairs;Squat;Sleep;Sit    Examination-Participation Restrictions Cleaning;Community Activity;Driving;Laundry;Meal Prep;Shop    Stability/Clinical Decision Making Evolving/Moderate complexity    Rehab Potential Good    PT Frequency 2x / week    PT Duration 4 weeks    PT Treatment/Interventions Patient/family education;Manual techniques;Functional mobility training;Therapeutic activities;Therapeutic exercise    PT Next Visit Plan progress stabilization exercises, adding vector stance, step ups keeping abs contracted  and lunging.  Possible  Manual to address B paraspinal spasms and pain    PT Home Exercise Plan 9/29:  Lt knee to chest, LTR, active hamstring stretch, ab set           Patient will benefit from skilled therapeutic intervention in order to improve the following deficits and impairments:  Decreased activity tolerance, Decreased balance, Decreased range of motion, Decreased strength, Difficulty walking, Hypomobility, Increased fascial restricitons, Increased muscle spasms, Impaired flexibility, Pain  Visit Diagnosis: Radiculopathy, lumbar region  Muscle weakness (generalized)     Problem List Patient Active Problem List   Diagnosis Date Noted   Exertional dyspnea    Chest pain of  uncertain etiology    Fibromyalgia 09/10/2019   S/P TKR (total knee replacement), right 12/29/17 01/10/2018   Primary osteoarthritis of right knee    GERD without esophagitis 03/28/2017   Obesity (BMI 30-39.9) 03/28/2017   Obesity 10/06/2016   Lumbar foraminal stenosis 06/04/2016   Carpal tunnel syndrome, bilateral 02/07/2014   S/P lumbar spinal fusion 02/07/2014   Obstructive sleep apnea 02/04/2014   Hand arthritis 10/15/2013   HTN (hypertension), benign 08/24/2013   Hypertriglyceridemia 08/24/2013   Iliotibial band tendonitis 02/08/2013   Trigger finger, acquired 02/08/2013   Low back pain 06/22/2011   HNP (herniated nucleus pulposus) 06/22/2011   HIP PAIN, RIGHT 12/15/2010   LUMBOSACRAL SPONDYLOSIS WITHOUT MYELOPATHY 12/15/2010   SPINAL STENOSIS, LUMBAR 12/15/2010   KNEE REPLACEMENT, HX OF 02/26/2010   DERANGEMENT MENISCUS 07/03/2009   KNEE, ARTHRITIS, DEGEN./OSTEO 05/22/2009   KNEE PAIN 05/22/2009   ANSERINE BURSITIS 05/22/2009   TRIGGER FINGER 09/23/2008   Rayetta Humphrey, PT CLT [phone] 09/12/2020, 9:12 AM  [place], [place], [address] Phone: [phone]   Fax:  [phone]  Name: Emily [name] MRN: [id number] Date of Birth: November 05, 1951

## 2020-09-15 ENCOUNTER — Other Ambulatory Visit: Payer: Self-pay

## 2020-09-15 ENCOUNTER — Ambulatory Visit (HOSPITAL_COMMUNITY): Payer: PPO | Admitting: Physical Therapy

## 2020-09-15 DIAGNOSIS — M6281 Muscle weakness (generalized): Secondary | ICD-10-CM

## 2020-09-15 DIAGNOSIS — M5416 Radiculopathy, lumbar region: Secondary | ICD-10-CM

## 2020-09-15 NOTE — Therapy (Signed)
[facility] [address] [place], [place], [address] Phone: [phone]   Fax:  [phone]  Physical Therapy Treatment  Patient Details  Name: Emily [name] MRN: [id number] Date of Birth: 1951/02/29 Referring Provider (PT): Deri Fuelling   Encounter Date: 09/15/2020   PT End of Session - 09/15/20 1118    Visit Number 4    Number of Visits 8    Date for PT Re-Evaluation 10/03/20    Authorization Type MCR    Progress Note Due on Visit 8    PT Start Time 1004    PT Stop Time 1044    PT Time Calculation (min) 40 min    Activity Tolerance Patient tolerated treatment well    Behavior During Therapy [facility] for tasks assessed/performed           Past Medical History:  Diagnosis Date  . Acid reflux   . Anxiety   . Arthritis   . CHF (congestive heart failure) ([facility])    90's swelling body-no breathing problems at that time-no cardiac -in dr s [place]gang [place] office [place] give lasix and potassium  . Complication of anesthesia    History of being told she was allergic to something in 1980 no problems since  . Depression   . Fibromyalgia   . Fibromyalgia 09/10/2019  . History of blood transfusion reaction 1978  . History of hiatal hernia   . HNP (herniated nucleus pulposus)   . Hypertension    Does not see a cardiologist no stress or echo  . Joint pain   . Nerve pain    right leg   . Ruptured lumbar disc   . Sleep apnea    Uses CPAP every night setting at 7 mm/Hg    Past Surgical History:  Procedure Laterality Date  . BACK SURGERY     x3  . BLADDER REPAIR     tear during during birth of child  . CARPAL TUNNEL RELEASE Bilateral   . CESAREAN SECTION    . CHOLECYSTECTOMY     gallbladder disease; stayed sick  . COLONOSCOPY N/A 04/28/2016   Procedure: COLONOSCOPY;  Surgeon: [name], MD;  [facility];  Service: Endoscopy;  Laterality: N/A;  . ESOPHAGOGASTRODUODENOSCOPY N/A 04/28/2016   Procedure:  ESOPHAGOGASTRODUODENOSCOPY (EGD);  Surgeon: [name], MD;  [facility];  Service: Endoscopy;  Laterality: N/A;  12:00  . GALLBLADDER SURGERY    . knee right (torn Cart)    . REPLACEMENT TOTAL KNEE     left  . RIGHT/LEFT HEART CATH AND CORONARY ANGIOGRAPHY N/A 01/23/2020   Procedure: RIGHT/LEFT HEART CATH AND CORONARY ANGIOGRAPHY;  Surgeon: [name], MD;  Location: [facility];  Service: Cardiovascular;  Laterality: N/A;  . ROTATOR CUFF REPAIR Bilateral    right and left  . SPINAL FUSION    . TOTAL KNEE ARTHROPLASTY Right 12/29/2017   Procedure: TOTAL KNEE ARTHROPLASTY-right knee;  Surgeon: [name], MD;  Location: [facility];  Service: Orthopedics;  Laterality: Right;  . TUBAL LIGATION    . vein right leg removed    . wrist ganglion cyst left      There were no vitals filed for this visit.   Subjective Assessment - 09/15/20 1005    Subjective pt reports no worse no better.  Currently 5/10 pain.    Currently in Pain? Yes    Pain Score 5     Pain Location Back  Pain Orientation Right;Lower    Pain Descriptors / Indicators Aching                             [facility] Adult PT Treatment/Exercise - 09/15/20 0001      Lumbar Exercises: Stretches   Active Hamstring Stretch Right;Left;3 reps;30 seconds    Active Hamstring Stretch Limitations instructed in standing today using 12" box      Lumbar Exercises: Standing   Heel Raises 20 reps    Functional Squats 20 reps    Forward Lunge 10 reps;Limitations    Forward Lunge Limitations onto 4" step without UE assist    Side Lunge Limitations VECTOR 5X5" each with 1 HHA    Scapular Retraction Strengthening;Both;20 reps    Theraband Level (Scapular Retraction) Level 3 (Green)    Row Strengthening;Both;Theraband;20 reps    Theraband Level (Row) Level 3 (Green)    Shoulder Extension Strengthening;Both;Theraband;20 reps    Theraband Level (Shoulder Extension) Level 3 (Green)    Other  Standing Lumbar Exercises side stepping monster walk 2RT on blue line,  single leg stance x 3 B     Other Standing Lumbar Exercises paloff w/green tband x 10 B (40 reps total), lateral and forward step up with power up on 4" step and 1 HHA 10 reps      Lumbar Exercises: Seated   Sit to Stand 10 reps                    PT Short Term Goals - 09/10/20 0845      PT SHORT TERM GOAL #1   Title Pt to be I in HEP to allow pt maximum back pain level to be no greater than a 6/10    Time 2    Period Weeks    Status On-going    Target Date 09/17/20      PT SHORT TERM GOAL #2   Title Pt to be sleeping at least 4 hours at a time.    Time 2    Period Weeks    Status On-going      PT SHORT TERM GOAL #3   Title Pt to be able to sit for 30 minutes without increased back  pain to return to sewing    Time 2    Period Weeks    Status On-going      PT SHORT TERM GOAL #4   Title Pt to no longer be having radicular sx to demonstrate decreased nerve irritation.    Time 2    Period Weeks    Status On-going             PT Long Term Goals - 09/10/20 0846      PT LONG TERM GOAL #1   Title Pt to be I in advance HEP to allow pt maximal back pain to be no greater than a 3/10    Time 4    Period Weeks    Status On-going      PT LONG TERM GOAL #2   Title Pt to be able to stand for 30 minutes to be able to make a meal without increased back pain.    Time 4    Period Weeks    Status On-going      PT LONG TERM GOAL #3   Title PT LE strength to be at least a 4+/5 to allow pt to feel confident coming down steps  in a reciprocal mannner.    Time 4    Period Weeks    Status On-going      PT LONG TERM GOAL #4   Title PT to be able to sit for an hour without increased back pain for eating, sewing in comfort.    Time 4    Period Weeks    Status On-going                 Plan - 09/15/20 1118    Clinical Impression Statement Continued with focus on improving LE stabiltiy, core  stability and stretching tight muscles.  Good form with squats and sidestepping activity.  Instructed with alternative positions for hamstring and piriformis stretches.    Began new exercises today including lunges, vectors and lateral step ups.  Most challenge and fatigue voiced with lateral step ups and vectors in hip/quad.  No pain voiced during therex today and no increased pain or change at end of session.    Personal Factors and Comorbidities Fitness;Past/Current Experience;Time since onset of injury/illness/exacerbation    Examination-Activity Limitations Bed Mobility;Bend;Carry;Dressing;Lift;Locomotion Level;Stand;Stairs;Squat;Sleep;Sit    Examination-Participation Restrictions Cleaning;Community Activity;Driving;Laundry;Meal Prep;Shop    Stability/Clinical Decision Making Evolving/Moderate complexity    Rehab Potential Good    PT Frequency 2x / week    PT Duration 4 weeks    PT Treatment/Interventions Patient/family education;Manual techniques;Functional mobility training;Therapeutic activities;Therapeutic exercise    PT Next Visit Plan progress stabilization exercises as able.   Possible Manual to address B paraspinal spasms and pain    PT Home Exercise Plan 9/29:  Lt knee to chest, LTR, active hamstring stretch, ab set           Patient will benefit from skilled therapeutic intervention in order to improve the following deficits and impairments:  Decreased activity tolerance, Decreased balance, Decreased range of motion, Decreased strength, Difficulty walking, Hypomobility, Increased fascial restricitons, Increased muscle spasms, Impaired flexibility, Pain  Visit Diagnosis: No diagnosis found.     Problem List Patient Active Problem List   Diagnosis Date Noted  . Exertional dyspnea   . Chest pain of uncertain etiology   . Fibromyalgia 09/10/2019  . S/P TKR (total knee replacement), right 12/29/17 01/10/2018  . Primary osteoarthritis of right knee   . GERD without esophagitis  03/28/2017  . Obesity (BMI 30-39.9) 03/28/2017  . Obesity 10/06/2016  . Lumbar foraminal stenosis 06/04/2016  . Carpal tunnel syndrome, bilateral 02/07/2014  . S/P lumbar spinal fusion 02/07/2014  . Obstructive sleep apnea 02/04/2014  . Hand arthritis 10/15/2013  . HTN (hypertension), benign 08/24/2013  . Hypertriglyceridemia 08/24/2013  . Iliotibial band tendonitis 02/08/2013  . Trigger finger, acquired 02/08/2013  . Low back pain 06/22/2011  . HNP (herniated nucleus pulposus) 06/22/2011  . HIP PAIN, RIGHT 12/15/2010  . LUMBOSACRAL SPONDYLOSIS WITHOUT MYELOPATHY 12/15/2010  . SPINAL STENOSIS, LUMBAR 12/15/2010  . KNEE REPLACEMENT, HX OF 02/26/2010  . DERANGEMENT MENISCUS 07/03/2009  . KNEE, ARTHRITIS, DEGEN./OSTEO 05/22/2009  . KNEE PAIN 05/22/2009  . ANSERINE BURSITIS 05/22/2009  . TRIGGER FINGER 09/23/2008   Teena Irani, PTA/CLT [phone]  Teena Irani 09/15/2020, 11:19 AM  [place], [place], [address] Phone: [phone]   Fax:  [phone]  Name: GLENA PHARRIS MRN: [id number] Date of Birth: 12-Dec-1950

## 2020-09-17 ENCOUNTER — Other Ambulatory Visit: Payer: Self-pay

## 2020-09-17 ENCOUNTER — Encounter (HOSPITAL_COMMUNITY): Payer: PPO | Admitting: Physical Therapy

## 2020-09-17 ENCOUNTER — Encounter (HOSPITAL_COMMUNITY): Payer: Self-pay

## 2020-09-17 ENCOUNTER — Telehealth: Payer: Self-pay

## 2020-09-17 ENCOUNTER — Ambulatory Visit (HOSPITAL_COMMUNITY): Payer: PPO

## 2020-09-17 DIAGNOSIS — M5416 Radiculopathy, lumbar region: Secondary | ICD-10-CM

## 2020-09-17 DIAGNOSIS — M6281 Muscle weakness (generalized): Secondary | ICD-10-CM

## 2020-09-17 NOTE — Telephone Encounter (Signed)
Patient was seen 8/5 for back pain and given #20 of hydrocodone. Please advise if can be refilled.

## 2020-09-17 NOTE — Telephone Encounter (Signed)
Patient said that Dr. Nicki Reaper told her whenever she needs a refill on her pain medicine she could just call.  Patient informed Dr. Nicki Reaper is out of the office.   [place]

## 2020-09-17 NOTE — Therapy (Signed)
[place], [place], [address] Phone: [phone]   Fax:  [phone]  Physical Therapy Treatment  Patient Details  Name: Emily [name] MRN: [id number] Date of Birth: 1951/09/14 Referring Provider (PT): Deri Fuelling   Encounter Date: 09/17/2020   PT End of Session - 09/17/20 0921    Visit Number 5    Number of Visits 8    Date for PT Re-Evaluation 10/03/20    Authorization Type MCR    Progress Note Due on Visit 8    PT Start Time 0917    PT Stop Time 0957    PT Time Calculation (min) 40 min    Activity Tolerance Patient tolerated treatment well    Behavior During Therapy [facility] for tasks assessed/performed           Past Medical History:  Diagnosis Date  . Acid reflux   . Anxiety   . Arthritis   . CHF (congestive heart failure) (HCC)    90's swelling body-no breathing problems at that time-no cardiac -in dr s Wolfgang [place] office [place] give lasix and potassium  . Complication of anesthesia    History of being told she was allergic to something in 1980 no problems since  . Depression   . Fibromyalgia   . Fibromyalgia 09/10/2019  . History of blood transfusion reaction 1978  . History of hiatal hernia   . HNP (herniated nucleus pulposus)   . Hypertension    Does not see a cardiologist no stress or echo  . Joint pain   . Nerve pain    right leg   . Ruptured lumbar disc   . Sleep apnea    Uses CPAP every night setting at 7 mm/Hg    Past Surgical History:  Procedure Laterality Date  . BACK SURGERY     x3  . BLADDER REPAIR     tear during during birth of child  . CARPAL TUNNEL RELEASE Bilateral   . CESAREAN SECTION    . CHOLECYSTECTOMY     gallbladder disease; stayed sick  . COLONOSCOPY N/A 04/28/2016   Procedure: COLONOSCOPY;  Surgeon: [name], MD;  [facility];  Service: Endoscopy;  Laterality: N/A;  . ESOPHAGOGASTRODUODENOSCOPY N/A 04/28/2016   Procedure:  ESOPHAGOGASTRODUODENOSCOPY (EGD);  Surgeon: [name], MD;  Location: [facility];  Service: Endoscopy;  Laterality: N/A;  12:00  . GALLBLADDER SURGERY    . knee right (torn Cart)    . REPLACEMENT TOTAL KNEE     left  . RIGHT/LEFT HEART CATH AND CORONARY ANGIOGRAPHY N/A 01/23/2020   Procedure: RIGHT/LEFT HEART CATH AND CORONARY ANGIOGRAPHY;  Surgeon: [name], MD;  Location: [facility];  Service: Cardiovascular;  Laterality: N/A;  . ROTATOR CUFF REPAIR Bilateral    right and left  . SPINAL FUSION    . TOTAL KNEE ARTHROPLASTY Right 12/29/2017   Procedure: TOTAL KNEE ARTHROPLASTY-right knee;  Surgeon: [name], MD;  Location: [facility];  Service: Orthopedics;  Laterality: Right;  . TUBAL LIGATION    . vein right leg removed    . wrist ganglion cyst left      There were no vitals filed for this visit.   Subjective Assessment - 09/17/20 0919    Subjective Pt arrived with reports of increased pain today, stated she went to a funeral and stood for ~35 minutes with increased pain across lower back and Rt hip.  Pain scale 9-10/10 currently.  Reports increased pain while completing her exercises this morning.    Pertinent History TKR B, 3 back surgeries all were fusions, carpal tunnel surgery    Patient Stated Goals less pain    Currently in Pain? Yes    Pain Score 9     Pain Location Back    Pain Orientation Lower   Rt hip   Pain Type Chronic pain    Pain Radiating Towards Concentration on Rt hip and inner groin    Pain Onset More than a month ago    Pain Frequency Constant    Aggravating Factors  activity    Pain Relieving Factors resting    Effect of Pain on Daily Activities limits                             [facility] Adult PT Treatment/Exercise - 09/17/20 0001      Lumbar Exercises: Standing   Other Standing Lumbar Exercises 2 min walking with ab set      Lumbar Exercises: Supine   Bridge 5 reps   3 sets   Straight Leg Raise 5 reps      Straight Leg Raises Limitations Lt only with core set following MET    Other Supine Lumbar Exercises iso abd/add 5x 5"      Manual Therapy   Manual Therapy Muscle Energy Technique;Soft tissue mobilization    Manual therapy comments Manual complete separate than rest of tx    Soft tissue mobilization Lt Sidelying with pillow between knees, STM to Rt piriformis, lateral hip and groin/adductors    Muscle Energy Technique Rt SI anterior rotation f/b pubic clearance                    PT Short Term Goals - 09/10/20 0845      PT SHORT TERM GOAL #1   Title Pt to be I in HEP to allow pt maximum back pain level to be no greater than a 6/10    Time 2    Period Weeks    Status On-going    Target Date 09/17/20      PT SHORT TERM GOAL #2   Title Pt to be sleeping at least 4 hours at a time.    Time 2    Period Weeks    Status On-going      PT SHORT TERM GOAL #3   Title Pt to be able to sit for 30 minutes without increased back  pain to return to sewing    Time 2    Period Weeks    Status On-going      PT SHORT TERM GOAL #4   Title Pt to no longer be having radicular sx to demonstrate decreased nerve irritation.    Time 2    Period Weeks    Status On-going             PT Long Term Goals - 09/10/20 0846      PT LONG TERM GOAL #1   Title Pt to be I in advance HEP to allow pt maximal back pain to be no greater than a 3/10    Time 4    Period Weeks    Status On-going      PT LONG TERM GOAL #2   Title Pt to be able to stand for 30 minutes to be able to make a meal without increased back pain.    Time  4    Period Weeks    Status On-going      PT LONG TERM GOAL #3   Title PT LE strength to be at least a 4+/5 to allow pt to feel confident coming down steps in a reciprocal mannner.    Time 4    Period Weeks    Status On-going      PT LONG TERM GOAL #4   Title PT to be able to sit for an hour without increased back pain for eating, sewing in comfort.    Time 4     Period Weeks    Status On-going                 Plan - 09/17/20 1003    Clinical Impression Statement Pt limited by LBP and Rt hip pain upon entrance, noted gait impairements.  Further examination noted significant LLD for Rt longer than Lt.  MET complete for SI alignment wiht some improvements with alignment but minimal reports in pain reduction.  Noted tightness Rt hip region muscualture, STM complete to Rt piriformis, lateral hip and adductors/groin.Therex focus on pubic clearing and abdominal strengthening.  Improved gait mechanics noted at EOS, continues to be limited by pain.    Personal Factors and Comorbidities Fitness;Past/Current Experience;Time since onset of injury/illness/exacerbation    Examination-Activity Limitations Bed Mobility;Bend;Carry;Dressing;Lift;Locomotion Level;Stand;Stairs;Squat;Sleep;Sit    Examination-Participation Restrictions Cleaning;Community Activity;Driving;Laundry;Meal Prep;Shop    Stability/Clinical Decision Making Evolving/Moderate complexity    Rehab Potential Good    PT Frequency 2x / week    PT Duration 4 weeks    PT Treatment/Interventions Patient/family education;Manual techniques;Functional mobility training;Therapeutic activities;Therapeutic exercise    PT Next Visit Plan assess SI alignment if continues to have high pain scale, MET/STM PRN.  progress stabilization exercises as able.   Possible Manual to address B paraspinal spasms and pain    PT Home Exercise Plan 9/29:  Lt knee to chest, LTR, active hamstring stretch, ab set           Patient will benefit from skilled therapeutic intervention in order to improve the following deficits and impairments:  Decreased activity tolerance, Decreased balance, Decreased range of motion, Decreased strength, Difficulty walking, Hypomobility, Increased fascial restricitons, Increased muscle spasms, Impaired flexibility, Pain  Visit Diagnosis: Muscle weakness (generalized)  Radiculopathy, lumbar  region     Problem List Patient Active Problem List   Diagnosis Date Noted  . Exertional dyspnea   . Chest pain of uncertain etiology   . Fibromyalgia 09/10/2019  . S/P TKR (total knee replacement), right 12/29/17 01/10/2018  . Primary osteoarthritis of right knee   . GERD without esophagitis 03/28/2017  . Obesity (BMI 30-39.9) 03/28/2017  . Obesity 10/06/2016  . Lumbar foraminal stenosis 06/04/2016  . Carpal tunnel syndrome, bilateral 02/07/2014  . S/P lumbar spinal fusion 02/07/2014  . Obstructive sleep apnea 02/04/2014  . Hand arthritis 10/15/2013  . HTN (hypertension), benign 08/24/2013  . Hypertriglyceridemia 08/24/2013  . Iliotibial band tendonitis 02/08/2013  . Trigger finger, acquired 02/08/2013  . Low back pain 06/22/2011  . HNP (herniated nucleus pulposus) 06/22/2011  . HIP PAIN, RIGHT 12/15/2010  . LUMBOSACRAL SPONDYLOSIS WITHOUT MYELOPATHY 12/15/2010  . SPINAL STENOSIS, LUMBAR 12/15/2010  . KNEE REPLACEMENT, HX OF 02/26/2010  . DERANGEMENT MENISCUS 07/03/2009  . KNEE, ARTHRITIS, DEGEN./OSTEO 05/22/2009  . KNEE PAIN 05/22/2009  . ANSERINE BURSITIS 05/22/2009  . TRIGGER FINGER 09/23/2008   Ihor Austin, LPTA/CLT; CBIS [phone]  Aldona Lento 09/17/2020, 10:11 AM  [facility]  [place], [place], [address] Phone: [phone]   Fax:  [phone]  Name: MAIRELY FOXWORTH MRN: [id number] Date of Birth: 12-Mar-1951

## 2020-09-17 NOTE — Telephone Encounter (Signed)
Please advise. Thank you

## 2020-09-18 ENCOUNTER — Other Ambulatory Visit: Payer: Self-pay | Admitting: Family Medicine

## 2020-09-18 MED ORDER — HYDROCODONE-ACETAMINOPHEN 5-325 MG PO TABS: 1.0000 | ORAL_TABLET | Freq: Four times a day (QID) | ORAL | 0 refills | Status: DC | PRN

## 2020-09-18 NOTE — Telephone Encounter (Signed)
Prescription was sent to [facility] in [place] for short supply  Although it is true that in her situation I can send in a prescription of medication it should be known by the patient if she starts having to utilize medicine on a frequent basis she will need to be under a pain management contract. Also pain medicines do require an up-to-date visit within the previous 3 months in order to meet the state and federal guidelines.  Since I did see the patient recently I was able to send in a prescription today thank you

## 2020-09-18 NOTE — Telephone Encounter (Signed)
Discussed with pt. Pt verbalized understanding. Pt wanted to let dr scott know that she has a tear at L3 and L4 and she is seeing dr Trenton Gammon for it and next appt with him is in one week and having right hip pain and seeing dr Aline Brochure for it and next appt is in 2 weeks. She states she does not take no more than one per day but having to do that right now due to the pain. She has been in therapy for 3 weeks now.

## 2020-09-21 NOTE — Telephone Encounter (Signed)
Nurses please pend hydrocodone 5 mg / 325 mg 1 twice daily as needed pain caution drowsiness #30 May fill on 09/28/2020. I would like for the patient to do a office visit somewhere in the next 45 days to set up chronic pain management so that we can send in 3 prescriptions at a time (Nurses please explain this is standard protocol for all patients who are being prescribed for chronic pain management and regular prescribing of pain medicines it requires a pain management contract and urine drug screen etc. thank you)

## 2020-09-22 ENCOUNTER — Ambulatory Visit (HOSPITAL_COMMUNITY): Payer: PPO

## 2020-09-22 ENCOUNTER — Other Ambulatory Visit: Payer: Self-pay | Admitting: *Deleted

## 2020-09-22 ENCOUNTER — Telehealth (HOSPITAL_COMMUNITY): Payer: Self-pay

## 2020-09-22 NOTE — Telephone Encounter (Signed)
Lmtc

## 2020-09-22 NOTE — Telephone Encounter (Signed)
pt called to cx today's appt due to she is hurting

## 2020-09-22 NOTE — Telephone Encounter (Signed)
Med pended and patient made appointment.   Edyth Gunnels, MD  [facility] Clinical Pool 16 hours ago (5:39 PM)     Nurses please pend hydrocodone 5 mg / 325 mg 1 twice daily as needed pain caution drowsiness #30 May fill on 09/28/2020. I would like for the patient to do a office visit somewhere in the next 45 days to set up chronic pain management so that we can send in 3 prescriptions at a time (Nurses please explain this is standard protocol for all patients who are being prescribed for chronic pain management and regular prescribing of pain medicines it requires a pain management contract and urine drug screen etc. thank you)

## 2020-09-23 MED ORDER — HYDROCODONE-ACETAMINOPHEN 5-325 MG PO TABS: ORAL_TABLET | ORAL | 0 refills | Status: DC

## 2020-09-24 ENCOUNTER — Ambulatory Visit (HOSPITAL_COMMUNITY): Payer: PPO | Admitting: Physical Therapy

## 2020-09-24 ENCOUNTER — Telehealth (HOSPITAL_COMMUNITY): Payer: Self-pay | Admitting: Physical Therapy

## 2020-09-24 NOTE — Telephone Encounter (Signed)
Called patient about missed appointment this morning. Patient says she was in a lot of pain, so did not come to visit. Reminded patient about upcoming visit on Monday, and to call to cancel/ reschedule if needed.    4:41 PM, 09/24/20 Josue Hector PT DPT  Physical Therapist with [facility]  [phone]

## 2020-09-25 DIAGNOSIS — R030 Elevated blood-pressure reading, without diagnosis of hypertension: Secondary | ICD-10-CM | POA: Insufficient documentation

## 2020-09-25 DIAGNOSIS — M5416 Radiculopathy, lumbar region: Secondary | ICD-10-CM | POA: Diagnosis not present

## 2020-09-29 ENCOUNTER — Ambulatory Visit (HOSPITAL_COMMUNITY): Payer: PPO | Admitting: Physical Therapy

## 2020-09-29 ENCOUNTER — Telehealth (HOSPITAL_COMMUNITY): Payer: Self-pay | Admitting: Physical Therapy

## 2020-09-29 NOTE — Telephone Encounter (Signed)
PHYSICAL THERAPY DISCHARGE SUMMARY  Visits from Start of Care: 5 Current functional level related to goals / functional outcomes: Continues to have back pain states that she is getting surgery on her back    Remaining deficits: Unknown as pt did not come back; therapist called pt re: no shows.   Education / Equipment: HEP Plan: Patient agrees to discharge.  Patient goals were not met. Patient is being discharged due to not returning since the last visit.  ?????    Rayetta Humphrey, [place] CLT [phone]

## 2020-10-01 ENCOUNTER — Ambulatory Visit (HOSPITAL_COMMUNITY): Payer: PPO | Admitting: Physical Therapy

## 2020-10-02 ENCOUNTER — Encounter: Payer: Self-pay | Admitting: Orthopedic Surgery

## 2020-10-02 ENCOUNTER — Ambulatory Visit: Payer: PPO | Admitting: Orthopedic Surgery

## 2020-10-10 ENCOUNTER — Other Ambulatory Visit: Payer: Self-pay | Admitting: Family Medicine

## 2020-10-16 ENCOUNTER — Ambulatory Visit: Payer: PPO | Admitting: Orthopedic Surgery

## 2020-10-16 ENCOUNTER — Ambulatory Visit: Payer: PPO

## 2020-10-16 ENCOUNTER — Encounter: Payer: Self-pay | Admitting: Orthopedic Surgery

## 2020-10-16 ENCOUNTER — Other Ambulatory Visit: Payer: Self-pay

## 2020-10-16 VITALS — BP 159/85 | HR 76 | Ht 62.0 in | Wt 170.0 lb

## 2020-10-16 DIAGNOSIS — M25551 Pain in right hip: Secondary | ICD-10-CM

## 2020-10-16 NOTE — Progress Notes (Signed)
Chief Complaint  Patient presents with  . Hip Pain    right groin painful/ has right lumbar spine pain too, is to have lumbar surgery soon     69 year old female with lumbar disc disease also having some right groin pain at least on one occasion and primarily pain lower back iliac crest on the right no trauma  Soon to have surgery on L1-L2 but did want her hip checked out  Past Medical History:  Diagnosis Date  . Acid reflux   . Anxiety   . Arthritis   . CHF (congestive heart failure) (HCC)    90's swelling body-no breathing problems at that time-no cardiac -in dr s Wolfgang [place] office [place] give lasix and potassium  . Complication of anesthesia    History of being told she was allergic to something in 1980 no problems since  . Depression   . Fibromyalgia   . Fibromyalgia 09/10/2019  . History of blood transfusion reaction 1978  . History of hiatal hernia   . HNP (herniated nucleus pulposus)   . Hypertension    Does not see a cardiologist no stress or echo  . Joint pain   . Nerve pain    right leg   . Ruptured lumbar disc   . Sleep apnea    Uses CPAP every night setting at 7 mm/Hg    Review of Systems  Constitutional: Negative for fever.  Respiratory: Negative for shortness of breath.   Cardiovascular: Negative for chest pain.    BP (!) 159/85   Pulse 76   Ht 5\' 2"  (1.575 m)   Wt 170 lb (77.1 kg)   BMI 31.09 kg/m   Emily [name] is awake and alert oriented x3 mood is pleasant affect is flat  She has equal leg lengths she has an incision on the right knee from her previous total knee she has normal flexion of her hip in the groin with rotation of the hip  Stress test of the hip negative   Images were obtained in the office she has a fairly good joint space with no significant narrowing no osteophytes femoral head was normal in shape and contour  Impression Back and groin pain most likely from her back  Encounter Diagnosis  Name Primary?  . Pain in right hip Yes    (CHRONIC LOW RISK IN HOUSE XRAYS )

## 2020-10-22 ENCOUNTER — Ambulatory Visit (INDEPENDENT_AMBULATORY_CARE_PROVIDER_SITE_OTHER): Payer: PPO | Admitting: Family Medicine

## 2020-10-22 ENCOUNTER — Encounter: Payer: Self-pay | Admitting: Family Medicine

## 2020-10-22 ENCOUNTER — Other Ambulatory Visit: Payer: Self-pay

## 2020-10-22 VITALS — BP 124/80 | HR 85 | Temp 97.8°F | Ht 62.0 in | Wt 173.0 lb

## 2020-10-22 DIAGNOSIS — Z79891 Long term (current) use of opiate analgesic: Secondary | ICD-10-CM

## 2020-10-22 DIAGNOSIS — K219 Gastro-esophageal reflux disease without esophagitis: Secondary | ICD-10-CM | POA: Diagnosis not present

## 2020-10-22 DIAGNOSIS — F320 Major depressive disorder, single episode, mild: Secondary | ICD-10-CM

## 2020-10-22 DIAGNOSIS — I10 Essential (primary) hypertension: Secondary | ICD-10-CM | POA: Diagnosis not present

## 2020-10-22 DIAGNOSIS — Z23 Encounter for immunization: Secondary | ICD-10-CM | POA: Diagnosis not present

## 2020-10-22 MED ORDER — PRAVASTATIN SODIUM 20 MG PO TABS
20.0000 mg | ORAL_TABLET | Freq: Every day | ORAL | 1 refills | Status: DC
Start: 2020-10-22 — End: 2020-10-22

## 2020-10-22 MED ORDER — PRAVASTATIN SODIUM 20 MG PO TABS
20.0000 mg | ORAL_TABLET | Freq: Every day | ORAL | 1 refills | Status: DC
Start: 2020-10-22 — End: 2021-08-24

## 2020-10-22 MED ORDER — DULOXETINE HCL 60 MG PO CPEP
60.0000 mg | ORAL_CAPSULE | Freq: Every day | ORAL | 1 refills | Status: DC
Start: 2020-10-22 — End: 2021-02-03

## 2020-10-22 MED ORDER — HYDROCODONE-ACETAMINOPHEN 5-325 MG PO TABS: ORAL_TABLET | ORAL | 0 refills | Status: DC

## 2020-10-22 MED ORDER — HYDROCHLOROTHIAZIDE 25 MG PO TABS
25.0000 mg | ORAL_TABLET | Freq: Every day | ORAL | 1 refills | Status: DC
Start: 2020-10-22 — End: 2021-02-03

## 2020-10-22 MED ORDER — PANTOPRAZOLE SODIUM 40 MG PO TBEC
40.0000 mg | DELAYED_RELEASE_TABLET | Freq: Every day | ORAL | 1 refills | Status: DC
Start: 2020-10-22 — End: 2021-01-28

## 2020-10-22 MED ORDER — AMLODIPINE BESYLATE 5 MG PO TABS
5.0000 mg | ORAL_TABLET | Freq: Every day | ORAL | 1 refills | Status: DC
Start: 2020-10-22 — End: 2021-06-12

## 2020-10-22 MED ORDER — ALPRAZOLAM 0.5 MG PO TABS
ORAL_TABLET | ORAL | 4 refills | Status: DC
Start: 2020-10-22 — End: 2021-09-16

## 2020-10-22 NOTE — Patient Instructions (Signed)

## 2020-10-22 NOTE — Progress Notes (Signed)
Subjective:    Patient ID: Emily [name], female    DOB: Jan 12, 1951, 69 y.o.   MRN: [id number]  HPI This patient was seen today for chronic pain  The medication list was reviewed and updated.   -Compliance with medication: takes 3 -4 a week  - Number patient states they take daily: 3 -4 a week. Does not take every day  -when was the last dose patient took? Yesterday.  The patient was advised the importance of maintaining medication and not using illegal substances with these.  Here for refills and follow up  The patient was educated that we can provide 3 monthly scripts for their medication, it is their responsibility to follow the instructions.  Side effects or complications from medications: none  Patient is aware that pain medications are meant to minimize the severity of the pain to allow their pain levels to improve to allow for better function. They are aware of that pain medications cannot totally remove their pain.  Due for UDT ( at least once per year) : due today  Scale of 1 to 10 ( 1 is least 10 is most) Your pain level without the medicine: 2-10 Your pain level with medication: 2  Scale 1 to 10 ( 1-helps very little, 10 helps very well) How well does your pain medication reduce your pain so you can function better through out the day? 2-10  Would like flu vaccine today.   Patient does have depression she takes her medicine her main issue with depression is her physical health with her back she is facing more surgery may be next year.  Not suicidal. Does have some mild anxiety issues.  Does use Xanax on occasion  Uses Cymbalta on a regular basis for depression she feels it seems to be doing okay  Takes her GERD medicine on a regular basis it does a good job of pantoprazole.  She would like to continue this we did discuss reducing the dose  Has hyperlipidemia takes her medicine and we monitor her lab work on a regular basis she is compliant   Has blood  pressure issues takes medicine blood pressure under good control recently  Mild obesity patient encouraged to watch diet  Review of Systems  Constitutional: Negative for activity change, appetite change and fatigue.  HENT: Negative for congestion and rhinorrhea.   Respiratory: Negative for cough and shortness of breath.   Cardiovascular: Negative for chest pain and leg swelling.  Gastrointestinal: Negative for abdominal pain and diarrhea.  Endocrine: Negative for polydipsia and polyphagia.  Musculoskeletal: Positive for arthralgias and back pain.  Skin: Negative for color change.  Neurological: Negative for dizziness and weakness.  Psychiatric/Behavioral: Negative for behavioral problems and confusion.       Objective:   Physical Exam Vitals reviewed.  Constitutional:      General: She is not in acute distress. HENT:     Head: Normocephalic and atraumatic.  Eyes:     General:        Right eye: No discharge.        Left eye: No discharge.  Neck:     Trachea: No tracheal deviation.  Cardiovascular:     Rate and Rhythm: Normal rate and regular rhythm.     Heart sounds: Normal heart sounds. No murmur heard.   Pulmonary:     Effort: Pulmonary effort is normal. No respiratory distress.     Breath sounds: Normal breath sounds.  Lymphadenopathy:     Cervical: No  cervical adenopathy.  Skin:    General: Skin is warm and dry.  Neurological:     Mental Status: She is alert.     Coordination: Coordination normal.  Psychiatric:        Behavior: Behavior normal.           Assessment & Plan:  1. Need for vaccination Flu shot today - Flu Vaccine QUAD High Dose(Fluad)  2. Encounter for long-term opiate analgesic use Pain management contract signed patient does not use it frequently maybe once a day at the most 30 tablets given she will let us know when she needs a new prescription - ToxASSURE Select 13 (MW), Urine  3. Depression, major, single episode, mild ([place]) Takes  her Cymbalta on a regular basis hopefully will be having surgery for her back in the near future which will improve her physical condition which should improve her mood patient defers on counseling  4. HTN (hypertension), benign Blood pressure good control continue current measures.  5. GERD without esophagitis With the reflux is under good control I recommend reducing the PPI to every other day as long as her symptoms do well on that she will let us know  Follow-up within 6 months lab work not indicated currently

## 2020-10-28 LAB — TOXASSURE SELECT 13 (MW), URINE

## 2020-11-07 ENCOUNTER — Other Ambulatory Visit: Payer: Self-pay

## 2020-11-07 ENCOUNTER — Ambulatory Visit
Admission: EM | Admit: 2020-11-07 | Discharge: 2020-11-07 | Disposition: A | Discharge status: Home / Self Care | Payer: PPO | Attending: Emergency Medicine | Admitting: Emergency Medicine

## 2020-11-07 DIAGNOSIS — R1030 Lower abdominal pain, unspecified: Secondary | ICD-10-CM | POA: Diagnosis not present

## 2020-11-07 DIAGNOSIS — N3289 Other specified disorders of bladder: Secondary | ICD-10-CM

## 2020-11-07 LAB — POCT URINALYSIS DIP (MANUAL ENTRY)
Bilirubin, UA: NEGATIVE
Blood, UA: NEGATIVE
Glucose, UA: NEGATIVE mg/dL
Ketones, POC UA: NEGATIVE mg/dL
Nitrite, UA: NEGATIVE
Protein Ur, POC: NEGATIVE mg/dL
Spec Grav, UA: 1.02 (ref 1.010–1.025)
Urobilinogen, UA: 0.2 E.U./dL
pH, UA: 7 (ref 5.0–8.0)

## 2020-11-07 MED ORDER — OXYBUTYNIN CHLORIDE ER 5 MG PO TB24
5.0000 mg | ORAL_TABLET | Freq: Every day | ORAL | 0 refills | Status: DC
Start: 2020-11-07 — End: 2020-12-30

## 2020-11-07 NOTE — Discharge Instructions (Addendum)
Urine culture sent.  We will call you with the results.   Push fluids and get plenty of rest.   Take oxybutynin as prescribed for bladder spasm  follow up with PCP if symptoms persists Return here or go to ER if you have any new or worsening symptoms such as fever, worsening abdominal pain, nausea/vomiting, flank pain, etc..

## 2020-11-07 NOTE — ED Triage Notes (Signed)
Pt reports concern for UTI.  Lower abdominal fullness,bladder spasms, incomplete bladder emptying, frequency, and odor with urination since yesterday.  No blood in urine. No vaginal discharge.

## 2020-11-07 NOTE — ED Provider Notes (Signed)
[facility]   Chief Complaint  Patient presents with  . Urinary Tract Infection     SUBJECTIVE:  Emily [name] is a 69 y.o. female who presented to the urgent care for complaint of lower abdominal pain, bladder spasm, and urine frequency for the past 1-2 days.  Patient denies a precipitating event, recent sexual encounter, excessive caffeine intake.  Localizes the pain to the lower abdomen.  Pain is pain is intermittent and described as achy.  Has tried OTC medications without relief.  Symptoms are made worse with urination.  Denies similar symptoms in the past.  Denies fever, chills, nausea, vomiting, abdominal pain, flank pain, abnormal vaginal discharge or bleeding, hematuria.    LMP: No LMP recorded. Patient is postmenopausal.  ROS: As in HPI.  All other pertinent ROS negative.     Past Medical History:  Diagnosis Date  . Acid reflux   . Anxiety   . Arthritis   . CHF (congestive heart failure) (HCC)    90's swelling body-no breathing problems at that time-no cardiac -in dr s Wolfgang [place] office [place] give lasix and potassium  . Complication of anesthesia    History of being told she was allergic to something in 1980 no problems since  . Depression   . Fibromyalgia   . Fibromyalgia 09/10/2019  . History of blood transfusion reaction 1978  . History of hiatal hernia   . HNP (herniated nucleus pulposus)   . Hypertension    Does not see a cardiologist no stress or echo  . Joint pain   . Nerve pain    right leg   . Ruptured lumbar disc   . Sleep apnea    Uses CPAP every night setting at 7 mm/Hg   Past Surgical History:  Procedure Laterality Date  . BACK SURGERY     x3  . BLADDER REPAIR     tear during during birth of child  . CARPAL TUNNEL RELEASE Bilateral   . CESAREAN SECTION    . CHOLECYSTECTOMY     gallbladder disease; stayed sick  . COLONOSCOPY N/A 04/28/2016   Procedure: COLONOSCOPY;  Surgeon: [name], MD;  Location: [facility];  Service:  Endoscopy;  Laterality: N/A;  . ESOPHAGOGASTRODUODENOSCOPY N/A 04/28/2016   Procedure: ESOPHAGOGASTRODUODENOSCOPY (EGD);  Surgeon: [name], MD;  Location: [facility];  Service: Endoscopy;  Laterality: N/A;  12:00  . GALLBLADDER SURGERY    . knee right (torn Cart)    . REPLACEMENT TOTAL KNEE     left  . RIGHT/LEFT HEART CATH AND CORONARY ANGIOGRAPHY N/A 01/23/2020   Procedure: RIGHT/LEFT HEART CATH AND CORONARY ANGIOGRAPHY;  Surgeon: [name], MD;  Location: [facility];  Service: Cardiovascular;  Laterality: N/A;  . ROTATOR CUFF REPAIR Bilateral    right and left  . SPINAL FUSION    . TOTAL KNEE ARTHROPLASTY Right 12/29/2017   Procedure: TOTAL KNEE ARTHROPLASTY-right knee;  Surgeon: [name], MD;  Location: [facility];  Service: Orthopedics;  Laterality: Right;  . TUBAL LIGATION    . vein right leg removed    . wrist ganglion cyst left     Allergies  Allergen Reactions  . Codeine Itching  . Oxycodone-Acetaminophen Itching  . Sulfamethoxazole-Trimethoprim Hives  . Celebrex [Celecoxib] Other (See Comments)    Homicidal thoughts.  . Hydrocodone-Acetaminophen Itching and Other (See Comments)    Takes with benadryl  . Losartan Other (See Comments)    Skin on arms got loose  .  Lyrica [Pregabalin] Other (See Comments)    When combined with narcotics will cause SpO2 to drop  . Tramadol Itching   No current facility-administered medications on file prior to encounter.   Current Outpatient Medications on File Prior to Encounter  Medication Sig Dispense Refill  . acetaminophen (TYLENOL) 500 MG tablet Take 500 mg by mouth every 6 (six) hours as needed. She takes two Q 8 hours prn    . ALPRAZolam (XANAX) 0.5 MG tablet 1/2-1 BID prn for anxiety,caution drowsiness. 20 tablet 4  . amLODipine (NORVASC) 5 MG tablet Take 1 tablet (5 mg total) by mouth daily. 90 tablet 1  . diazepam (VALIUM) 5 MG tablet Take 5 mg by mouth every 6 (six) hours as needed for muscle  spasms.     . diphenhydrAMINE (BENADRYL ALLERGY) 25 MG tablet Take 1 tablet (25 mg total) by mouth every 4 (four) hours. Take with hydrocodone 60 tablet 5  . DULoxetine (CYMBALTA) 60 MG capsule Take 1 capsule (60 mg total) by mouth daily. 90 capsule 1  . hydrochlorothiazide (HYDRODIURIL) 25 MG tablet Take 1 tablet (25 mg total) by mouth daily. 90 tablet 1  . HYDROcodone-acetaminophen (NORCO/VICODIN) 5-325 MG tablet Take 1 tablet 2 times daily as needed for pain. Caution drowsiness. 30 tablet 0  . Misc. Devices (TOILET SEAT ELEVATOR) MISC Elevated toilet seat if needed 1 each 0  . pantoprazole (PROTONIX) 40 MG tablet Take 1 tablet (40 mg total) by mouth daily. 90 tablet 1  . potassium chloride SA (KLOR-CON) 20 MEQ tablet Take 1 tablet by mouth once daily 90 tablet 0  . pravastatin (PRAVACHOL) 20 MG tablet Take 1 tablet (20 mg total) by mouth daily. 90 tablet 1   Social History   Socioeconomic History  . Marital status: Married    Spouse name: Not on file  . Number of children: Not on file  . Years of education: college   . Highest education level: Not on file  Occupational History  . Occupation: nurse   Tobacco Use  . Smoking status: Never Smoker  . Smokeless tobacco: Never Used  Vaping Use  . Vaping Use: Never used  Substance and Sexual Activity  . Alcohol use: No  . Drug use: No  . Sexual activity: Never    Birth control/protection: Post-menopausal  Other Topics Concern  . Not on file  Social History Narrative  . Not on file   Social Determinants of Health   Financial Resource Strain:   . Difficulty of Paying Living Expenses: Not on file  Food Insecurity:   . Worried About Charity fundraiser in the Last Year: Not on file  . Ran Out of Food in the Last Year: Not on file  Transportation Needs:   . Lack of Transportation (Medical): Not on file  . Lack of Transportation (Non-Medical): Not on file  Physical Activity:   . Days of Exercise per Week: Not on file  . Minutes of  Exercise per Session: Not on file  Stress:   . Feeling of Stress : Not on file  Social Connections:   . Frequency of Communication with Friends and Family: Not on file  . Frequency of Social Gatherings with Friends and Family: Not on file  . Attends Religious Services: Not on file  . Active Member of Clubs or Organizations: Not on file  . Attends Archivist Meetings: Not on file  . Marital Status: Not on file  Intimate Partner Violence:   . Fear of  Current or Ex-Partner: Not on file  . Emotionally Abused: Not on file  . Physically Abused: Not on file  . Sexually Abused: Not on file   Family History  Problem Relation Age of Onset  . Hypertension Father   . Cancer Father        liposarcoma  . Heart disease Mother   . Cancer Other        family history   . Diabetes Other        family history   . Coronary artery disease Other        family history   . Arthritis Other        family history   . Asthma Other   . Diabetes Brother   . Heart disease Brother   . Coronary artery disease Brother 70       CABD    OBJECTIVE:  Vitals:   11/07/20 0950  BP: (!) 150/82  Pulse: 77  Resp: 18  Temp: 98.7 F (37.1 C)  TempSrc: Oral  SpO2: 95%   General appearance: AOx3 in no acute distress HEENT: NCAT.  Oropharynx clear.  Lungs: clear to auscultation bilaterally without adventitious breath sounds Heart: regular rate and rhythm.  Radial pulses 2+ symmetrical bilaterally Abdomen: soft; non-distended; no tenderness; bowel sounds present; no guarding or rebound tenderness Back: no CVA tenderness Extremities: no edema; symmetrical with no gross deformities Skin: warm and dry Neurologic: Ambulates from chair to exam table without difficulty Psychological: alert and cooperative; normal mood and affect  Labs Reviewed  POCT URINALYSIS DIP (MANUAL ENTRY) - Abnormal; Notable for the following components:      Result Value   Leukocytes, UA Small (1+) (*)    All other  components within normal limits  URINE CULTURE    ASSESSMENT & PLAN:  1. Lower abdominal pain   2. Bladder spasms     Meds ordered this encounter  Medications  . oxybutynin (DITROPAN-XL) 5 MG 24 hr tablet    Sig: Take 1 tablet (5 mg total) by mouth at bedtime.    Dispense:  10 tablet    Refill:  0   Discharge instructions  Urine culture sent.  We will call you with the results.   Push fluids and get plenty of rest.   Take oxybutynin as prescribed for bladder spasm  follow up with PCP if symptoms persists Return here or go to ER if you have any new or worsening symptoms such as fever, worsening abdominal pain, nausea/vomiting, flank pain, etc...  Outlined signs and symptoms indicating need for more acute intervention. Patient verbalized understanding. After Visit Summary given.     Emerson Monte, FNP 11/07/20 1030

## 2020-11-10 ENCOUNTER — Telehealth: Payer: Self-pay

## 2020-11-10 ENCOUNTER — Telehealth (HOSPITAL_COMMUNITY): Payer: Self-pay | Admitting: Emergency Medicine

## 2020-11-10 ENCOUNTER — Other Ambulatory Visit: Payer: Self-pay | Admitting: *Deleted

## 2020-11-10 LAB — URINE CULTURE: Culture: 10000 — AB

## 2020-11-10 MED ORDER — FLUCONAZOLE 150 MG PO TABS: 150.0000 mg | ORAL_TABLET | Freq: Once | ORAL | 0 refills | Status: AC

## 2020-11-10 MED ORDER — CEPHALEXIN 500 MG PO CAPS: 500.0000 mg | ORAL_CAPSULE | Freq: Two times a day (BID) | ORAL | 0 refills | Status: AC

## 2020-11-10 NOTE — Telephone Encounter (Signed)
Called and discussed with pt. Pt verbalized understanding. Med sent to pharm 

## 2020-11-10 NOTE — Telephone Encounter (Signed)
May try 150 mg Diflucan 1 p.o. x1 if having persistent troubles I would recommend the patient to be seen for further in person evaluation thank you

## 2020-11-10 NOTE — Telephone Encounter (Signed)
Pt has a yeast infection and wants to see if a med can be called in sent to [facility] in [place]   Pt call back [phone]

## 2020-11-10 NOTE — Telephone Encounter (Signed)
Called pt to get symptoms and she states no vaginal discharge or itching. She went to urgent care on 12/3. Having pain in bottom of stomach she says. No fever, trouble urinating, burning with urination and bladder spasms. Advised pt she would need appt but pt states she just went to urgent care and they checked her urine. Please advise.

## 2020-11-19 DIAGNOSIS — M5416 Radiculopathy, lumbar region: Secondary | ICD-10-CM | POA: Diagnosis not present

## 2020-11-20 ENCOUNTER — Other Ambulatory Visit: Payer: Self-pay | Admitting: Neurosurgery

## 2020-11-20 ENCOUNTER — Telehealth: Payer: Self-pay

## 2020-11-20 DIAGNOSIS — M5416 Radiculopathy, lumbar region: Secondary | ICD-10-CM

## 2020-11-20 NOTE — Telephone Encounter (Signed)
Phone call to patient to verify medication list and allergies for myelogram procedure. Pt instructed to hold  Cymbalta  for 48hrs prior to myelogram appointment time and 24 hours after appointment. Pt also instructed to have a driver the day of the procedure, the procedure would take around 2 hours, and discharge instructions discussed. Pt verbalized understanding.   

## 2020-11-24 DIAGNOSIS — G4733 Obstructive sleep apnea (adult) (pediatric): Secondary | ICD-10-CM | POA: Diagnosis not present

## 2020-11-27 ENCOUNTER — Ambulatory Visit
Admission: RE | Admit: 2020-11-27 | Discharge: 2020-11-27 | Disposition: A | Discharge status: Home / Self Care | Payer: PPO | Source: Ambulatory Visit | Attending: Neurosurgery | Admitting: Neurosurgery

## 2020-11-27 DIAGNOSIS — M5127 Other intervertebral disc displacement, lumbosacral region: Secondary | ICD-10-CM | POA: Diagnosis not present

## 2020-11-27 DIAGNOSIS — M5126 Other intervertebral disc displacement, lumbar region: Secondary | ICD-10-CM | POA: Diagnosis not present

## 2020-11-27 DIAGNOSIS — M4326 Fusion of spine, lumbar region: Secondary | ICD-10-CM | POA: Diagnosis not present

## 2020-11-27 DIAGNOSIS — M5416 Radiculopathy, lumbar region: Secondary | ICD-10-CM

## 2020-11-27 DIAGNOSIS — M4316 Spondylolisthesis, lumbar region: Secondary | ICD-10-CM | POA: Diagnosis not present

## 2020-11-27 DIAGNOSIS — M25551 Pain in right hip: Secondary | ICD-10-CM | POA: Diagnosis not present

## 2020-11-27 MED ORDER — DIAZEPAM 5 MG PO TABS
5.0000 mg | ORAL_TABLET | Freq: Once | ORAL | Status: AC
  Administered 2020-11-27: 5 mg via ORAL

## 2020-11-27 MED ORDER — IOPAMIDOL (ISOVUE-M 200) INJECTION 41%
15.0000 mL | Freq: Once | INTRAMUSCULAR | Status: AC
  Administered 2020-11-27: 15 mL via INTRATHECAL

## 2020-11-27 NOTE — Discharge Instructions (Signed)
Myelogram Discharge Instructions  1. Go home and rest quietly for the next 24 hours.  It is important to lie flat for the next 24 hours.  Get up only to go to the restroom.  You may lie in the bed or on a couch on your back, your stomach, your left side or your right side.  You may have one pillow under your head.  You may have pillows between your knees while you are on your side or under your knees while you are on your back.  2. DO NOT drive today.  Recline the seat as far back as it will go, while still wearing your seat belt, on the way home.  3. You may get up to go to the bathroom as needed.  You may sit up for 10 minutes to eat.  You may resume your normal diet and medications unless otherwise indicated.  Drink lots of extra fluids today and tomorrow.  4. The incidence of headache, nausea, or vomiting is about 5% (one in 20 patients).  If you develop a headache, lie flat and drink plenty of fluids until the headache goes away.  Caffeinated beverages may be helpful.  If you develop severe nausea and vomiting or a headache that does not go away with flat bed rest, call [phone].  5. You may resume normal activities after your 24 hours of bed rest is over; however, do not exert yourself strongly or do any heavy lifting tomorrow. If when you get up you have a headache when standing, go back to bed and force fluids for another 24 hours.  6. Call your physician for a follow-up appointment.  The results of your myelogram will be sent directly to your physician by the following day.  7. If you have any questions or if complications develop after you arrive home, please call [phone].  Discharge instructions have been explained to the patient.  The patient, or the person responsible for the patient, fully understands these instructions   You may take your Cymbalta tomorrow on 11/28/20 @9 :30am

## 2020-11-27 NOTE — Progress Notes (Signed)
Pt reports she has been off of her Cymbalta for at least 48 hours.  

## 2020-12-10 ENCOUNTER — Other Ambulatory Visit: Payer: Self-pay | Admitting: Family Medicine

## 2020-12-11 ENCOUNTER — Other Ambulatory Visit: Payer: Self-pay | Admitting: Family Medicine

## 2020-12-11 DIAGNOSIS — I10 Essential (primary) hypertension: Secondary | ICD-10-CM | POA: Diagnosis not present

## 2020-12-11 DIAGNOSIS — M5416 Radiculopathy, lumbar region: Secondary | ICD-10-CM | POA: Diagnosis not present

## 2020-12-11 DIAGNOSIS — Z6832 Body mass index (BMI) 32.0-32.9, adult: Secondary | ICD-10-CM | POA: Diagnosis not present

## 2020-12-11 NOTE — Telephone Encounter (Signed)
Request already sent to provider

## 2020-12-12 ENCOUNTER — Other Ambulatory Visit: Payer: Self-pay | Admitting: Family Medicine

## 2020-12-12 ENCOUNTER — Encounter: Payer: Self-pay | Admitting: Family Medicine

## 2020-12-12 MED ORDER — HYDROCODONE-ACETAMINOPHEN 10-325 MG PO TABS: 1.0000 | ORAL_TABLET | ORAL | 0 refills | Status: AC | PRN

## 2020-12-24 DIAGNOSIS — M5416 Radiculopathy, lumbar region: Secondary | ICD-10-CM | POA: Diagnosis not present

## 2020-12-30 ENCOUNTER — Encounter: Payer: Self-pay | Admitting: Family Medicine

## 2020-12-30 ENCOUNTER — Other Ambulatory Visit: Payer: Self-pay

## 2020-12-30 ENCOUNTER — Telehealth (INDEPENDENT_AMBULATORY_CARE_PROVIDER_SITE_OTHER): Payer: PPO | Admitting: Family Medicine

## 2020-12-30 DIAGNOSIS — G8929 Other chronic pain: Secondary | ICD-10-CM

## 2020-12-30 DIAGNOSIS — M5440 Lumbago with sciatica, unspecified side: Secondary | ICD-10-CM | POA: Diagnosis not present

## 2020-12-30 MED ORDER — HYDROCODONE-ACETAMINOPHEN 10-325 MG PO TABS: ORAL_TABLET | ORAL | 0 refills | Status: DC

## 2020-12-30 NOTE — Progress Notes (Signed)
   Subjective:    Patient ID: Emily [name], female    DOB: [birth year], 70 y.o.   MRN: [id number]  HPI  Patient calls to discuss establishment of pain management due to chronic back pain. Patient states she saw Dr Orpah Melter in pain management last week and was given 2 shots in her back and they didn't help. We had a long discussion regarding pain medicine and how the intent of pain medicine is to reduce pain but not totally remove it It is also important that it does not cause any adverse side effects such as severe drowsiness In addition to this we also discussed how all pain medication can be addictive but oxycodone has a higher risk.  We also talked about utilizing the minimum amount of medicine necessary to make her life feel better and being more functional Plain Tylenol and ibuprofen do not do enough for her to help her with her discomfort It is her wish once she has surgery and recovers to be able to get away from pain medicine. Virtual Visit via Video Note  I connected with Emily [name] on 12/30/20 at  1:10 PM EST by a video enabled telemedicine application and verified that I am speaking with the correct person using two identifiers.  Location: Patient: home Provider: office   I discussed the limitations of evaluation and management by telemedicine and the availability of in person appointments. The patient expressed understanding and agreed to proceed.  History of Present Illness:    Observations/Objective:   Assessment and Plan:   Follow Up Instructions:    I discussed the assessment and treatment plan with the patient. The patient was provided an opportunity to ask questions and all were answered. The patient agreed with the plan and demonstrated an understanding of the instructions.   The patient was advised to call back or seek an in-person evaluation if the symptoms worsen or if the condition fails to improve as anticipated.  I provided 20 minutes of  non-face-to-face time during this encounter.   Mitzie Na, RN   Review of Systems     Objective:   Physical Exam  Today's visit was via telephone Physical exam was not possible for this visit       Assessment & Plan:  Chronic pain Patient is going through 3 injections with the back specialist She has had her first 1 Has 2 more to go Then more than likely having surgery She realizes that pain medicine will take the edge off the pain but is not meant to completely take away the pain She will keep her medicine in the same spot She will not exceed 2/day Will not drive with medication if feeling drowsy And if having any progressive troubles or problems she will notify us Chronic pain contract was reviewed with patient She will follow-up in 4 weeks for chronic pain management discussion contract signing etc.

## 2021-01-01 ENCOUNTER — Other Ambulatory Visit: Payer: Self-pay

## 2021-01-01 ENCOUNTER — Ambulatory Visit: Payer: PPO | Admitting: Orthopedic Surgery

## 2021-01-01 ENCOUNTER — Encounter: Payer: Self-pay | Admitting: Orthopedic Surgery

## 2021-01-01 VITALS — BP 170/95 | HR 77 | Ht 62.0 in | Wt 173.0 lb

## 2021-01-01 DIAGNOSIS — M65332 Trigger finger, left middle finger: Secondary | ICD-10-CM

## 2021-01-01 NOTE — Progress Notes (Signed)
Chief Complaint  Patient presents with  . Hand Pain    Left trigger finger,    INJECTED 03/24/20  Left long finger   Locking   Trigger finger injection  Diagnosis   Encounter Diagnosis  Name Primary?  . Trigger middle finger of left hand Yes    Procedure injection A1 pulley Medications lidocaine 1% 1 mL and Depo-Medrol 40 mg 1 mL Skin prep alcohol and ethyl chloride Verbal consent was obtained Timeout confirmed the injection site  After cleaning the skin with alcohol and anesthetizing the skin with ethyl chloride the A1 pulley was palpated and the injection was performed without complication  Follow-up as needed

## 2021-01-05 ENCOUNTER — Ambulatory Visit: Payer: PPO | Admitting: Orthopedic Surgery

## 2021-01-05 DIAGNOSIS — M1711 Unilateral primary osteoarthritis, right knee: Secondary | ICD-10-CM

## 2021-01-05 DIAGNOSIS — Z96651 Presence of right artificial knee joint: Secondary | ICD-10-CM

## 2021-01-13 DIAGNOSIS — M5416 Radiculopathy, lumbar region: Secondary | ICD-10-CM | POA: Diagnosis not present

## 2021-01-13 DIAGNOSIS — Z6831 Body mass index (BMI) 31.0-31.9, adult: Secondary | ICD-10-CM | POA: Diagnosis not present

## 2021-01-13 DIAGNOSIS — M48062 Spinal stenosis, lumbar region with neurogenic claudication: Secondary | ICD-10-CM | POA: Diagnosis not present

## 2021-01-13 DIAGNOSIS — I10 Essential (primary) hypertension: Secondary | ICD-10-CM | POA: Diagnosis not present

## 2021-01-26 DIAGNOSIS — M461 Sacroiliitis, not elsewhere classified: Secondary | ICD-10-CM | POA: Insufficient documentation

## 2021-01-26 DIAGNOSIS — M5416 Radiculopathy, lumbar region: Secondary | ICD-10-CM | POA: Diagnosis not present

## 2021-01-26 DIAGNOSIS — M48062 Spinal stenosis, lumbar region with neurogenic claudication: Secondary | ICD-10-CM | POA: Diagnosis not present

## 2021-01-26 DIAGNOSIS — Z6831 Body mass index (BMI) 31.0-31.9, adult: Secondary | ICD-10-CM | POA: Diagnosis not present

## 2021-01-28 ENCOUNTER — Ambulatory Visit (INDEPENDENT_AMBULATORY_CARE_PROVIDER_SITE_OTHER): Payer: PPO | Admitting: Family Medicine

## 2021-01-28 ENCOUNTER — Encounter: Payer: Self-pay | Admitting: Family Medicine

## 2021-01-28 ENCOUNTER — Other Ambulatory Visit: Payer: Self-pay

## 2021-01-28 VITALS — BP 124/78 | HR 79 | Temp 95.2°F | Wt 171.2 lb

## 2021-01-28 DIAGNOSIS — I10 Essential (primary) hypertension: Secondary | ICD-10-CM

## 2021-01-28 DIAGNOSIS — M48061 Spinal stenosis, lumbar region without neurogenic claudication: Secondary | ICD-10-CM

## 2021-01-28 MED ORDER — HYDROCODONE-ACETAMINOPHEN 10-325 MG PO TABS: ORAL_TABLET | ORAL | 0 refills | Status: DC

## 2021-01-28 NOTE — Progress Notes (Signed)
Subjective:    Patient ID: Emily [name], female    DOB: November 11, 1951, 69 y.o.   MRN: [id number]  HPI This patient was seen today for chronic pain  The medication list was reviewed and updated.   -Compliance with medication: Hydrocodone 10-325 mg   - Number patient states they take daily: 1; sometimes 2   -when was the last dose patient took? Monday night   The patient was advised the importance of maintaining medication and not using illegal substances with these.  Here for refills and follow up  The patient was educated that we can provide 3 monthly scripts for their medication, it is their responsibility to follow the instructions.  Side effects or complications from medications: none  Patient is aware that pain medications are meant to minimize the severity of the pain to allow their pain levels to improve to allow for better function. They are aware of that pain medications cannot totally remove their pain.  Due for UDT ( at least once per year) : 12/23/19  Scale of 1 to 10 ( 1 is least 10 is most) Your pain level without the medicine: 8 depending on day Your pain level with medication: 6  Scale 1 to 10 ( 1-helps very little, 10 helps very well) How well does your pain medication reduce your pain so you can function better through out the day? 3  We discussed the rationale of pain medicine today discussing how pain medicine will take the edge off the pain but will not completely take it away she relates that she is very familiar with this concept and states adamantly that she does not use medication excessively.  As a matter fact she has a large portion of her recent prescription still available.  We also discussed how as she gets better hopefully she will be on less medicine and if she ever felt she had a problem with medicine to stop the medicine she denies any history of pain medicine abuse drug abuse or family issues of drug abuse she also relates dealing with some  depression but it is mainly because she does not have a good quality of life because of her back she is hopeful when she has her surgery rehab she will be doing better     Review of Systems  Constitutional: Negative for activity change and appetite change.  HENT: Negative for congestion and rhinorrhea.   Respiratory: Negative for cough and shortness of breath.   Cardiovascular: Negative for chest pain and leg swelling.  Gastrointestinal: Negative for abdominal pain, nausea and vomiting.  Musculoskeletal: Positive for arthralgias.  Skin: Negative for color change.  Neurological: Negative for dizziness and weakness.  Psychiatric/Behavioral: Negative for agitation and confusion.       Objective:   Physical Exam Vitals reviewed.  Constitutional:      General: She is not in acute distress.    Appearance: She is well-nourished.  HENT:     Head: Normocephalic.  Cardiovascular:     Rate and Rhythm: Normal rate and regular rhythm.     Heart sounds: Normal heart sounds. No murmur heard.   Pulmonary:     Effort: Pulmonary effort is normal.     Breath sounds: Normal breath sounds.  Musculoskeletal:        General: No edema.  Lymphadenopathy:     Cervical: No cervical adenopathy.  Neurological:     Mental Status: She is alert.  Psychiatric:        Behavior: Behavior  normal.   Does have a slight murmur which goes along with the trivial mitral valve regurg on previous echo from a year ago  We reviewed over the myelogram as well as CT patient states she had some additional questions she will send [place] a MyChart message she cannot recall the name of the issue currently      Assessment & Plan:  1. HTN (hypertension), benign Blood pressure good control continue current measures watch diet stay active  2. Lumbar foraminal stenosis We will go ahead with pain medicine no more than 2/day 3 scripts were given patient will do a follow-up in 3 to 4 months we also sent her the pain medicine  contract she agrees to all of this has no particular questions currently

## 2021-02-01 ENCOUNTER — Other Ambulatory Visit: Payer: Self-pay | Admitting: Family Medicine

## 2021-02-02 NOTE — Telephone Encounter (Signed)
01/28/21 last med check up

## 2021-02-03 MED ORDER — DULOXETINE HCL 60 MG PO CPEP: 60.0000 mg | ORAL_CAPSULE | Freq: Every day | ORAL | 1 refills | Status: DC

## 2021-02-03 NOTE — Telephone Encounter (Signed)
90-day with 1 refill on all

## 2021-02-06 DIAGNOSIS — Z6831 Body mass index (BMI) 31.0-31.9, adult: Secondary | ICD-10-CM | POA: Diagnosis not present

## 2021-02-06 DIAGNOSIS — M5416 Radiculopathy, lumbar region: Secondary | ICD-10-CM | POA: Diagnosis not present

## 2021-02-06 DIAGNOSIS — I10 Essential (primary) hypertension: Secondary | ICD-10-CM | POA: Diagnosis not present

## 2021-02-11 IMAGING — US BILATERAL CAROTID DUPLEX ULTRASOUND
1 series · 13 of 24 positions shown · non-contrast
Comparison: None.

CLINICAL DATA: 67-year-old female with dizziness

EXAM:
BILATERAL CAROTID DUPLEX ULTRASOUND
TECHNIQUE: Gray scale imaging, color Doppler and duplex ultrasound were
performed of bilateral carotid and vertebral arteries in the neck.

[Series 1: bilateral carotid duplex ultrasound · 13 of 69 slices shown]
[im 1/69]
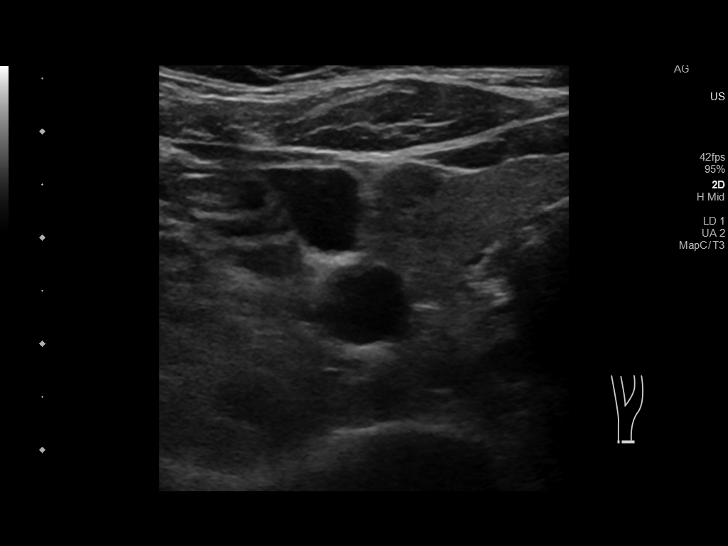
[im 6/69]
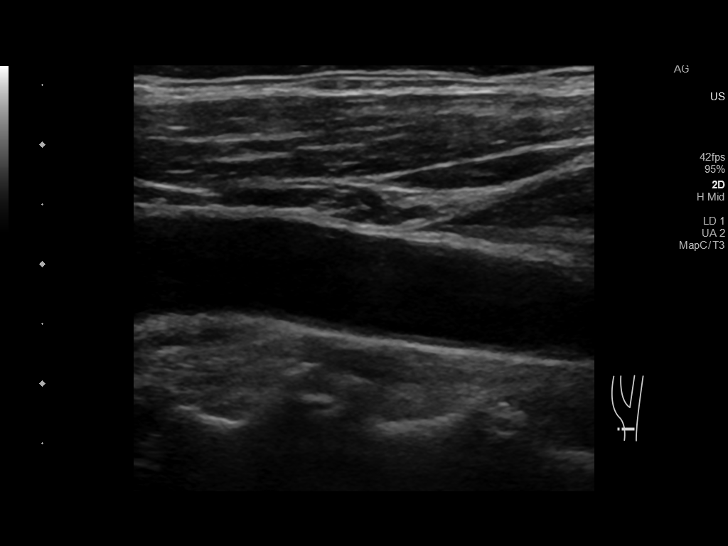
[im 12/69]
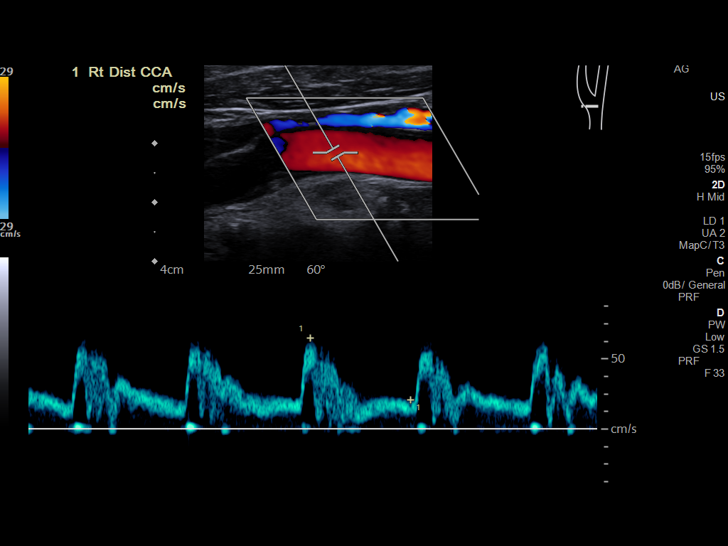
[im 18/69]
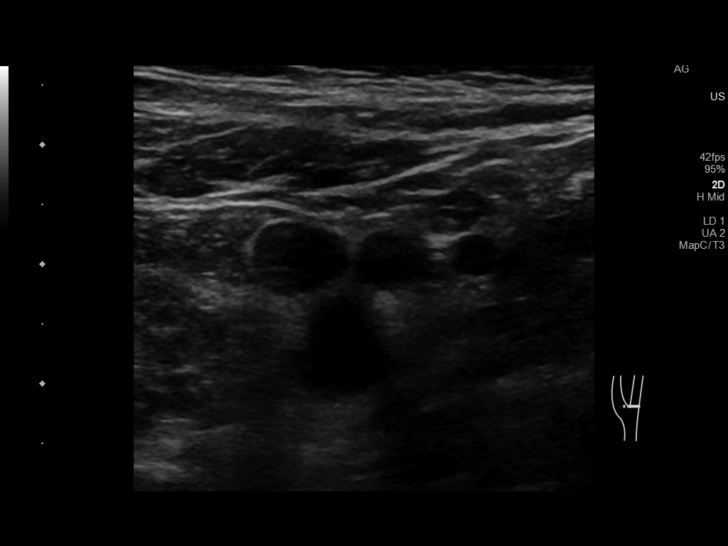
[im 24/69]
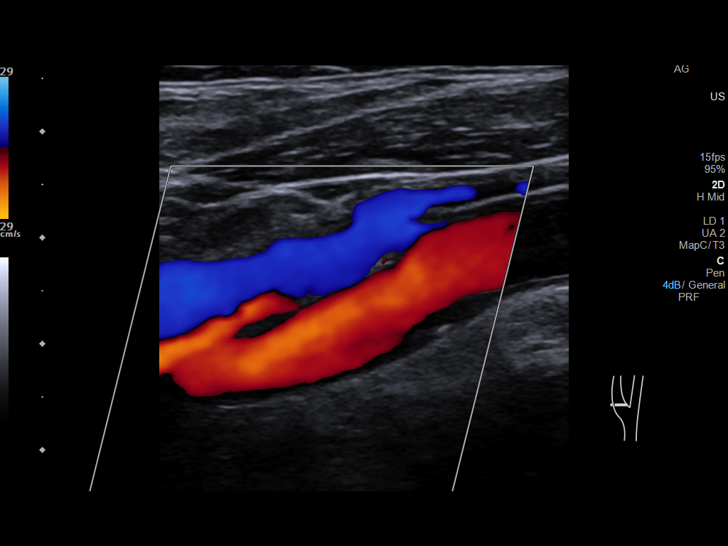
[im 30/69]
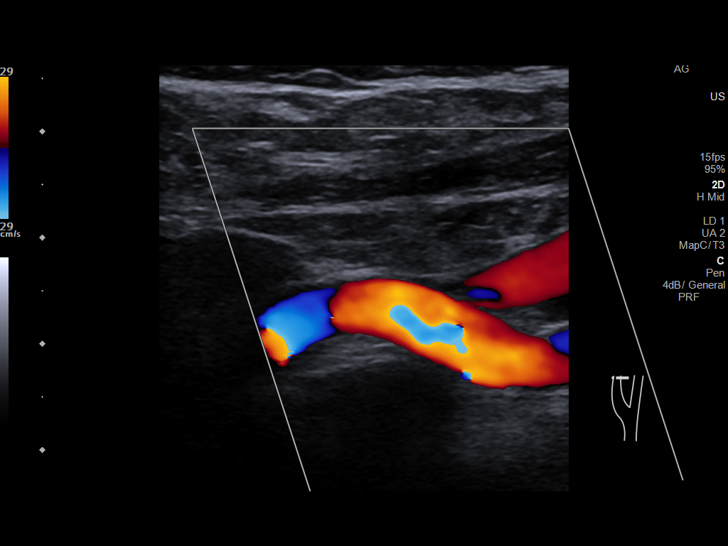
[im 36/69]
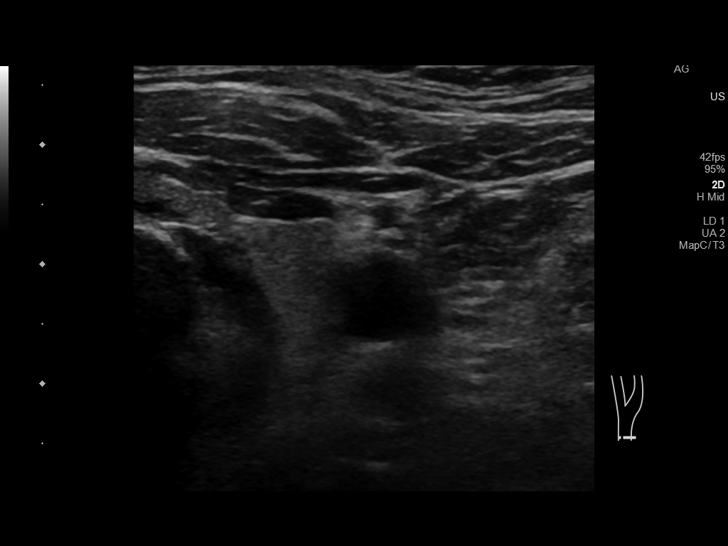
[im 39/69]
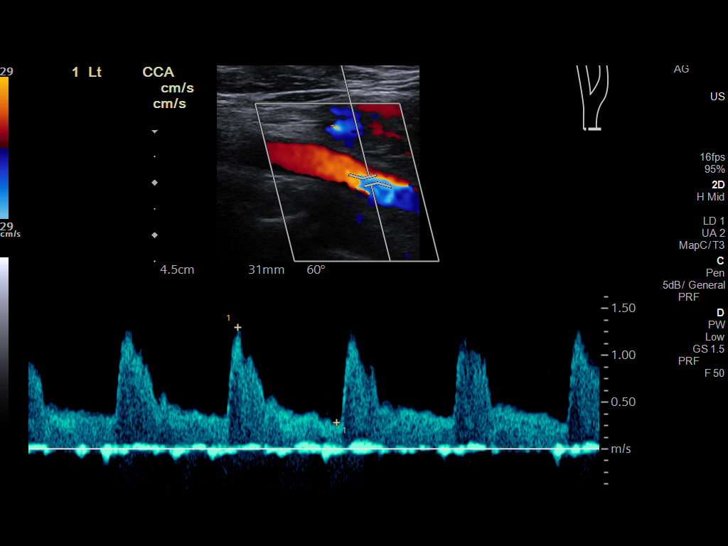
[im 45/69]
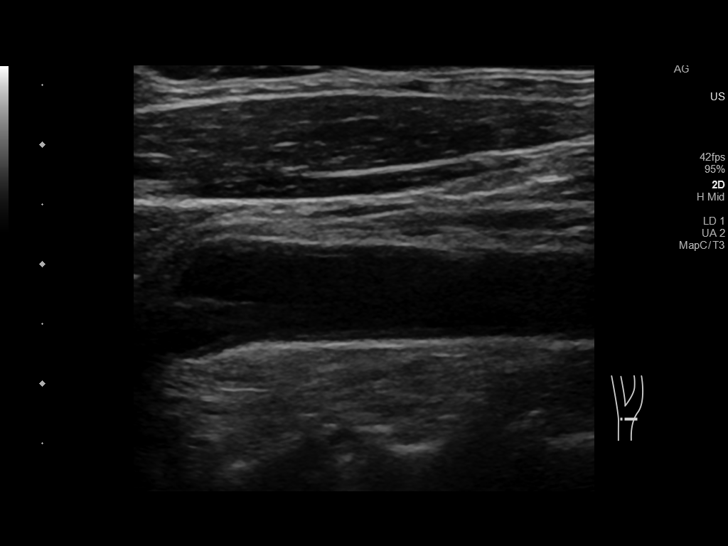
[im 51/69]
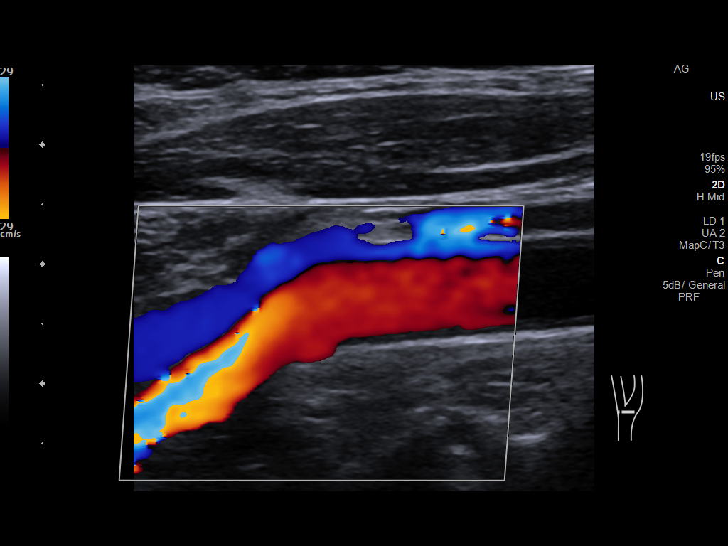
[im 57/69]
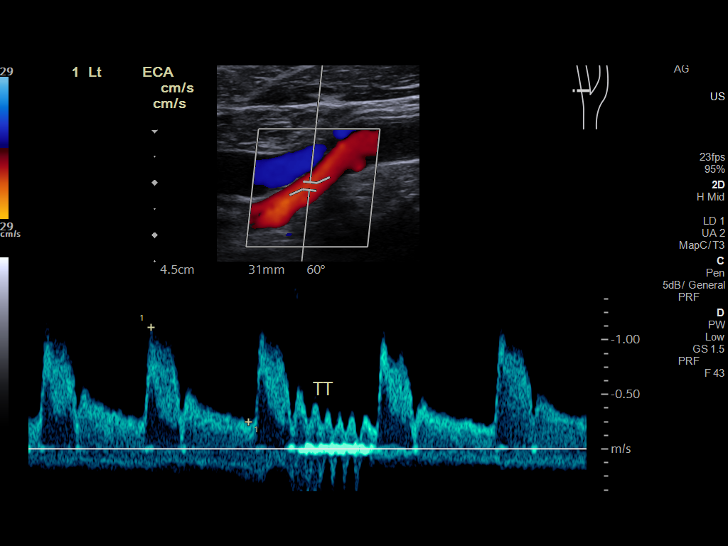
[im 63/69]
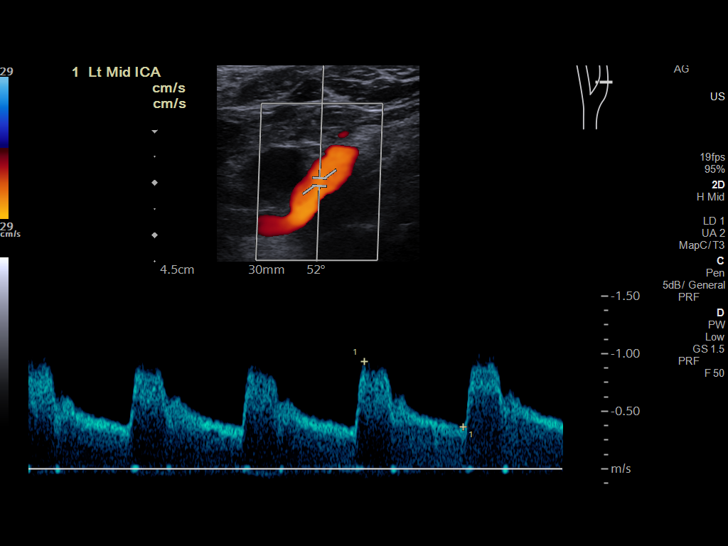
[im 69/69]
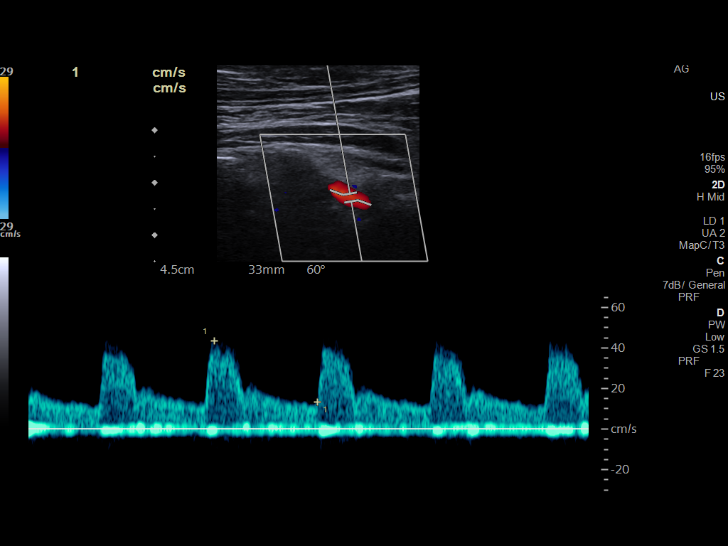

[13 of 24 positions shown; findings below may reference images not displayed]

FINDINGS: Criteria: Quantification of carotid stenosis is based on velocity
parameters that correlate the residual internal carotid diameter
with NASCET-based stenosis levels, using the diameter of the distal
internal carotid lumen as the denominator for stenosis measurement.

The following velocity measurements were obtained:

RIGHT
ICA: 106/42 cm/sec
CCA: 78/21 cm/sec

SYSTOLIC ICA/CCA RATIO:

ECA:  127 cm/sec

LEFT

ICA: 149/52 cm/sec in the distal ICA.

CCA: 88/29 cm/sec

SYSTOLIC ICA/CCA RATIO:

ECA:  111 cm/sec

RIGHT CAROTID ARTERY: Focal hypoechoic plaque in the proximal
internal carotid artery. By peak systolic velocity criteria, the
estimated stenosis is less than 50%.

RIGHT VERTEBRAL ARTERY:  Patent with normal antegrade flow.

LEFT CAROTID ARTERY: Trace heterogeneous atherosclerotic plaque in
the proximal internal carotid artery. By peak systolic velocity
criteria in the region of the plaque, the estimated stenosis remains
less than 50%. There is greater elevation of the peak systolic
velocity in the distal internal carotid artery within a tortuous
segment which is felt to be spurious.

LEFT VERTEBRAL ARTERY:  Patent with normal antegrade flow.
IMPRESSION: 1. Mild (1-49%) stenosis proximal right internal carotid artery
secondary to focal hypoechoic (lipid rich) atherosclerotic plaque.
2. Mild (1-49%) stenosis proximal left internal carotid artery
secondary to trace heterogeneous atherosclerotic plaque.
3. Vertebral arteries are patent with normal antegrade flow.

## 2021-02-14 ENCOUNTER — Other Ambulatory Visit: Payer: Self-pay

## 2021-02-14 ENCOUNTER — Encounter: Payer: Self-pay | Admitting: Emergency Medicine

## 2021-02-14 ENCOUNTER — Ambulatory Visit
Admission: EM | Admit: 2021-02-14 | Discharge: 2021-02-14 | Disposition: A | Discharge status: Home / Self Care | Payer: PPO | Attending: Emergency Medicine | Admitting: Emergency Medicine

## 2021-02-14 DIAGNOSIS — J069 Acute upper respiratory infection, unspecified: Secondary | ICD-10-CM | POA: Diagnosis not present

## 2021-02-14 DIAGNOSIS — H6593 Unspecified nonsuppurative otitis media, bilateral: Secondary | ICD-10-CM | POA: Diagnosis not present

## 2021-02-14 DIAGNOSIS — J329 Chronic sinusitis, unspecified: Secondary | ICD-10-CM | POA: Diagnosis not present

## 2021-02-14 DIAGNOSIS — B9789 Other viral agents as the cause of diseases classified elsewhere: Secondary | ICD-10-CM

## 2021-02-14 MED ORDER — BENZONATATE 100 MG PO CAPS: 100.0000 mg | ORAL_CAPSULE | Freq: Three times a day (TID) | ORAL | 0 refills | Status: DC | PRN

## 2021-02-14 MED ORDER — PREDNISONE 10 MG PO TABS: 20.0000 mg | ORAL_TABLET | Freq: Every day | ORAL | 0 refills | Status: DC

## 2021-02-14 MED ORDER — CETIRIZINE HCL 10 MG PO TABS: 10.0000 mg | ORAL_TABLET | Freq: Every day | ORAL | 0 refills | Status: DC

## 2021-02-14 MED ORDER — FLUTICASONE PROPIONATE 50 MCG/ACT NA SUSP: 1.0000 | Freq: Every day | NASAL | 0 refills | Status: DC

## 2021-02-14 NOTE — Discharge Instructions (Signed)
Get plenty of rest and push fluids Tessalon Perles prescribed for cough Zyrtec for nasal congestion, runny nose, and/or sore throat Flonase for nasal congestion and runny nose Prednisone was prescribed Use medications daily for symptom relief Use OTC medications like ibuprofen or tylenol as needed fever or pain Call or go to the ED if you have any new or worsening symptoms such as fever, worsening cough, shortness of breath, chest tightness, chest pain, turning blue, changes in mental status, etc..Marland Kitchen

## 2021-02-14 NOTE — ED Triage Notes (Signed)
Sinus drainage, cough and RT ear fullness that started yesterday

## 2021-02-14 NOTE — ED Provider Notes (Signed)
[place]   [id number] 02/14/21 Arrival Time: 1011   CC: URI symptoms  SUBJECTIVE: History from: patient.  Emily [name] is a 70 y.o. female who presented to the urgent care with a complaint of nasal congestion, sinus pressure, cough with yellowish clear sputum and right ear fullness that started yesterday.  Denies sick exposure to COVID, flu or strep.  Denies recent travel.  Has tried OTC medication without relief.  Denies alleviating or aggravating factors.  Denies previous symptoms in the past.   Denies fever, chills, fatigue, sinus pain, rhinorrhea, sore throat, SOB, wheezing, chest pain, nausea, changes in bowel or bladder habits.    ROS: As per HPI.  All other pertinent ROS negative.      Past Medical History:  Diagnosis Date  . Acid reflux   . Anxiety   . Arthritis   . CHF (congestive heart failure) (HCC)    90's swelling body-no breathing problems at that time-no cardiac -in dr s Wolfgang [place] office [place] give lasix and potassium  . Complication of anesthesia    History of being told she was allergic to something in 1980 no problems since  . Depression   . Fibromyalgia   . Fibromyalgia 09/10/2019  . History of blood transfusion reaction 1978  . History of hiatal hernia   . HNP (herniated nucleus pulposus)   . Hypertension    Does not see a cardiologist no stress or echo  . Joint pain   . Nerve pain    right leg   . Ruptured lumbar disc   . Sleep apnea    Uses CPAP every night setting at 7 mm/Hg   Past Surgical History:  Procedure Laterality Date  . BACK SURGERY     x3  . BLADDER REPAIR     tear during during birth of child  . CARPAL TUNNEL RELEASE Bilateral   . CESAREAN SECTION    . CHOLECYSTECTOMY     gallbladder disease; stayed sick  . COLONOSCOPY N/A 04/28/2016   Procedure: COLONOSCOPY;  Surgeon: [name], MD;  Location: [facility];  Service: Endoscopy;  Laterality: N/A;  . ESOPHAGOGASTRODUODENOSCOPY N/A 04/28/2016   Procedure:  ESOPHAGOGASTRODUODENOSCOPY (EGD);  Surgeon: [name], MD;  Location: [facility];  Service: Endoscopy;  Laterality: N/A;  12:00  . GALLBLADDER SURGERY    . knee right (torn Cart)    . REPLACEMENT TOTAL KNEE     left  . RIGHT/LEFT HEART CATH AND CORONARY ANGIOGRAPHY N/A 01/23/2020   Procedure: RIGHT/LEFT HEART CATH AND CORONARY ANGIOGRAPHY;  Surgeon: [name], MD;  Location: [facility];  Service: Cardiovascular;  Laterality: N/A;  . ROTATOR CUFF REPAIR Bilateral    right and left  . SPINAL FUSION    . TOTAL KNEE ARTHROPLASTY Right 12/29/2017   Procedure: TOTAL KNEE ARTHROPLASTY-right knee;  Surgeon: [name], MD;  Location: [facility];  Service: Orthopedics;  Laterality: Right;  . TUBAL LIGATION    . vein right leg removed    . wrist ganglion cyst left     Allergies  Allergen Reactions  . Codeine Itching  . Oxycodone-Acetaminophen Itching  . Sulfamethoxazole-Trimethoprim Hives  . Celebrex [Celecoxib] Other (See Comments)    Homicidal thoughts.  . Hydrocodone-Acetaminophen Itching and Other (See Comments)    Takes with benadryl  . Losartan Other (See Comments)    Skin on arms got loose  . Lyrica [Pregabalin] Other (See Comments)    When combined with narcotics will cause  SpO2 to drop  . Tramadol Itching   No current facility-administered medications on file prior to encounter.   Current Outpatient Medications on File Prior to Encounter  Medication Sig Dispense Refill  . acetaminophen (TYLENOL) 500 MG tablet Take 500 mg by mouth every 6 (six) hours as needed. She takes two Q 8 hours prn    . ALPRAZolam (XANAX) 0.5 MG tablet 1/2-1 BID prn for anxiety,caution drowsiness. 20 tablet 4  . amLODipine (NORVASC) 5 MG tablet Take 1 tablet (5 mg total) by mouth daily. 90 tablet 1  . diazepam (VALIUM) 5 MG tablet Take 5 mg by mouth every 6 (six) hours as needed for muscle spasms.     . diphenhydrAMINE (BENADRYL ALLERGY) 25 MG tablet Take 1 tablet (25 mg total) by  mouth every 4 (four) hours. Take with hydrocodone 60 tablet 5  . DULoxetine (CYMBALTA) 60 MG capsule Take 1 capsule (60 mg total) by mouth daily. 90 capsule 1  . hydrochlorothiazide (HYDRODIURIL) 25 MG tablet Take 1 tablet by mouth once daily 90 tablet 1  . HYDROcodone-acetaminophen (NORCO) 10-325 MG tablet One po 2 times daily as needed for pain 60 tablet 0  . HYDROcodone-acetaminophen (NORCO) 10-325 MG tablet One po 2 times daily as needed for pain 60 tablet 0  . HYDROcodone-acetaminophen (NORCO) 10-325 MG tablet One  Po 2 times daily as needed for pain 60 tablet 0  . Misc. Devices (TOILET SEAT ELEVATOR) MISC Elevated toilet seat if needed 1 each 0  . potassium chloride SA (KLOR-CON) 20 MEQ tablet TAKE 1  BY MOUTH ONCE DAILY 90 tablet 1  . pravastatin (PRAVACHOL) 20 MG tablet Take 1 tablet (20 mg total) by mouth daily. 90 tablet 1   Social History   Socioeconomic History  . Marital status: Married    Spouse name: Not on file  . Number of children: Not on file  . Years of education: college   . Highest education level: Not on file  Occupational History  . Occupation: nurse   Tobacco Use  . Smoking status: Never Smoker  . Smokeless tobacco: Never Used  Vaping Use  . Vaping Use: Never used  Substance and Sexual Activity  . Alcohol use: No  . Drug use: No  . Sexual activity: Never    Birth control/protection: Post-menopausal  Other Topics Concern  . Not on file  Social History Narrative  . Not on file   Social Determinants of Health   Financial Resource Strain: Not on file  Food Insecurity: Not on file  Transportation Needs: Not on file  Physical Activity: Not on file  Stress: Not on file  Social Connections: Not on file  Intimate Partner Violence: Not on file   Family History  Problem Relation Age of Onset  . Hypertension Father   . Cancer Father        liposarcoma  . Heart disease Mother   . Cancer Other        family history   . Diabetes Other        family  history   . Coronary artery disease Other        family history   . Arthritis Other        family history   . Asthma Other   . Diabetes Brother   . Heart disease Brother   . Coronary artery disease Brother 36       CABD    OBJECTIVE:  Vitals:   02/14/21 1024 02/14/21 1025  BP:  120/80  Pulse: 93   Resp: 18   Temp: 98.6 F (37 C)   TempSrc: Oral   SpO2: 95%      General appearance: alert; appears fatigued, but nontoxic; speaking in full sentences and tolerating own secretions HEENT: NCAT; Ears: EACs clear, bilateral TM with middle ear effusion; Eyes: PERRL.  EOM grossly intact. Sinuses: nontender; Nose: nares patent without rhinorrhea, Throat: oropharynx clear, tonsils non erythematous or enlarged, uvula midline  Neck: supple without LAD Lungs: unlabored respirations, symmetrical air entry; cough: moderate; no respiratory distress; CTAB Heart: regular rate and rhythm.  Radial pulses 2+ symmetrical bilaterally Skin: warm and dry Psychological: alert and cooperative; normal mood and affect  LABS:  No results found for this or any previous visit (from the past 24 hour(s)).   ASSESSMENT & PLAN:  1. URI with cough and congestion   2. Middle ear effusion, bilateral   3. Viral sinusitis     Meds ordered this encounter  Medications  . predniSONE (DELTASONE) 10 MG tablet    Sig: Take 2 tablets (20 mg total) by mouth daily.    Dispense:  15 tablet    Refill:  0  . benzonatate (TESSALON) 100 MG capsule    Sig: Take 1 capsule (100 mg total) by mouth 3 (three) times daily as needed for cough.    Dispense:  30 capsule    Refill:  0  . fluticasone (FLONASE) 50 MCG/ACT nasal spray    Sig: Place 1 spray into both nostrils daily for 14 days.    Dispense:  16 g    Refill:  0  . cetirizine (ZYRTEC ALLERGY) 10 MG tablet    Sig: Take 1 tablet (10 mg total) by mouth daily.    Dispense:  30 tablet    Refill:  0    Discharge Instructions  Get plenty of rest and push  fluids Tessalon Perles prescribed for cough Zyrtec for nasal congestion, runny nose, and/or sore throat Flonase for nasal congestion  and middle ear effusion Prednisone was prescribed Use medications daily for symptom relief Use OTC medications like ibuprofen or tylenol as needed fever or pain Call or go to the ED if you have any new or worsening symptoms such as fever, worsening cough, shortness of breath, chest tightness, chest pain, turning blue, changes in mental status, etc...   Reviewed expectations re: course of current medical issues. Questions answered. Outlined signs and symptoms indicating need for more acute intervention. Patient verbalized understanding. After Visit Summary given.         Emerson Monte, [place] 02/14/21 1042

## 2021-02-25 DIAGNOSIS — M5124 Other intervertebral disc displacement, thoracic region: Secondary | ICD-10-CM | POA: Diagnosis not present

## 2021-02-25 DIAGNOSIS — Z6831 Body mass index (BMI) 31.0-31.9, adult: Secondary | ICD-10-CM | POA: Diagnosis not present

## 2021-02-25 DIAGNOSIS — I10 Essential (primary) hypertension: Secondary | ICD-10-CM | POA: Diagnosis not present

## 2021-03-04 DIAGNOSIS — I10 Essential (primary) hypertension: Secondary | ICD-10-CM | POA: Diagnosis not present

## 2021-03-04 DIAGNOSIS — M461 Sacroiliitis, not elsewhere classified: Secondary | ICD-10-CM | POA: Diagnosis not present

## 2021-03-04 DIAGNOSIS — Z6831 Body mass index (BMI) 31.0-31.9, adult: Secondary | ICD-10-CM | POA: Diagnosis not present

## 2021-03-10 DIAGNOSIS — M5124 Other intervertebral disc displacement, thoracic region: Secondary | ICD-10-CM | POA: Diagnosis not present

## 2021-03-10 DIAGNOSIS — M546 Pain in thoracic spine: Secondary | ICD-10-CM | POA: Diagnosis not present

## 2021-03-11 DIAGNOSIS — M461 Sacroiliitis, not elsewhere classified: Secondary | ICD-10-CM | POA: Diagnosis not present

## 2021-03-19 DIAGNOSIS — M461 Sacroiliitis, not elsewhere classified: Secondary | ICD-10-CM | POA: Diagnosis not present

## 2021-03-19 DIAGNOSIS — Z6831 Body mass index (BMI) 31.0-31.9, adult: Secondary | ICD-10-CM | POA: Diagnosis not present

## 2021-03-19 DIAGNOSIS — M25559 Pain in unspecified hip: Secondary | ICD-10-CM | POA: Diagnosis not present

## 2021-04-22 ENCOUNTER — Encounter: Payer: Self-pay | Admitting: Family Medicine

## 2021-04-22 ENCOUNTER — Ambulatory Visit (INDEPENDENT_AMBULATORY_CARE_PROVIDER_SITE_OTHER): Payer: PPO | Admitting: Family Medicine

## 2021-04-22 ENCOUNTER — Other Ambulatory Visit: Payer: Self-pay

## 2021-04-22 VITALS — BP 118/78 | HR 74 | Temp 97.5°F | Ht 62.0 in | Wt 172.0 lb

## 2021-04-22 DIAGNOSIS — I10 Essential (primary) hypertension: Secondary | ICD-10-CM | POA: Diagnosis not present

## 2021-04-22 DIAGNOSIS — E785 Hyperlipidemia, unspecified: Secondary | ICD-10-CM

## 2021-04-22 DIAGNOSIS — Z79891 Long term (current) use of opiate analgesic: Secondary | ICD-10-CM | POA: Diagnosis not present

## 2021-04-22 DIAGNOSIS — G8929 Other chronic pain: Secondary | ICD-10-CM

## 2021-04-22 DIAGNOSIS — Z1211 Encounter for screening for malignant neoplasm of colon: Secondary | ICD-10-CM | POA: Diagnosis not present

## 2021-04-22 DIAGNOSIS — M5440 Lumbago with sciatica, unspecified side: Secondary | ICD-10-CM

## 2021-04-22 MED ORDER — HYDROCODONE-ACETAMINOPHEN 10-325 MG PO TABS
ORAL_TABLET | ORAL | 0 refills | Status: DC
Start: 2021-05-22 — End: 2021-06-16

## 2021-04-22 MED ORDER — HYDROCODONE-ACETAMINOPHEN 10-325 MG PO TABS: ORAL_TABLET | ORAL | 0 refills | Status: DC

## 2021-04-22 NOTE — Progress Notes (Signed)
Subjective:    Patient ID: Emily [name], female    DOB: 28-Sep-1951, 70 y.o.   MRN: [id number]  HPI  Pain management Doing an ablation with dr Brien Few in June soemtimes mid back other times down the leg This patient was seen today for chronic pain  The medication list was reviewed and updated.  Location of Pain for which the patient has been treated with regarding narcotics: neck, lumbar,sciatica  Onset of this pain: years ago   -Compliance with medication: good  - Number patient states they take daily: 1 or 2 usually 2  -when was the last dose patient took? Earlier today  The patient was advised the importance of maintaining medication and not using illegal substances with these.  Here for refills and follow up  The patient was educated that we can provide 3 monthly scripts for their medication, it is their responsibility to follow the instructions.  Side effects or complications from medications: none no drowsiness  Patient is aware that pain medications are meant to minimize the severity of the pain to allow their pain levels to improve to allow for better function. They are aware of that pain medications cannot totally remove their pain.  Due for UDT ( at least once per year) : next visit  Scale of 1 to 10 ( 1 is least 10 is most) Your pain level without the medicine: 8 Your pain level with medication 5  Scale 1 to 10 ( 1-helps very little, 10 helps very well) How well does your pain medication reduce your pain so you can function better through out the day? 7  Quality of the pain: burning aching  Persistence of the pain: all day  Modifying factors: worse with activity  Patient for blood pressure check up.  The patient does have hypertension.  The patient is on medication.  Patient relates compliance with meds. Todays BP reviewed with the patient. Patient denies issues with medication. Patient relates reasonable diet. Patient tries to minimize salt. Patient  aware of BP goals.  Patient here for follow-up regarding cholesterol.  The patient does have hyperlipidemia.  Patient does try to maintain a reasonable diet.  Patient does take the medication on a regular basis.  Denies missing a dose.  The patient denies any obvious side effects.  Prior blood work results reviewed with the patient.  The patient is aware of his cholesterol goals and the need to keep it under good control to lessen the risk of disease.  Doing some walking      Review of Systems     Objective:   Physical Exam Vitals reviewed.  Constitutional:      General: She is not in acute distress. HENT:     Head: Normocephalic and atraumatic.  Eyes:     General:        Right eye: No discharge.        Left eye: No discharge.  Neck:     Trachea: No tracheal deviation.  Cardiovascular:     Rate and Rhythm: Normal rate and regular rhythm.     Heart sounds: Normal heart sounds. No murmur heard.   Pulmonary:     Effort: Pulmonary effort is normal. No respiratory distress.     Breath sounds: Normal breath sounds.  Lymphadenopathy:     Cervical: No cervical adenopathy.  Skin:    General: Skin is warm and dry.  Neurological:     Mental Status: She is alert.     Coordination:  Coordination normal.  Psychiatric:        Behavior: Behavior normal.    Subjective back discomfort negative for any significant lower leg edema.  Subjective discomfort down the legs.       Assessment & Plan:  1. HTN (hypertension), benign Blood pressure decent control continue current medications watch diet - Comprehensive metabolic panel - Lipid panel  2. Chronic bilateral low back pain with sciatica, sciatica laterality unspecified Stretching exercises recommended, pain medicine as needed patient does not want to go above 2 tablets/day - Comprehensive metabolic panel - Lipid panel - HYDROcodone-acetaminophen (NORCO) 10-325 MG tablet; One po 2 times daily as needed for pain  Dispense: 60  tablet; Refill: 0 - HYDROcodone-acetaminophen (NORCO) 10-325 MG tablet; One po 2 times daily as needed for pain  Dispense: 60 tablet; Refill: 0 - HYDROcodone-acetaminophen (NORCO) 10-325 MG tablet; One  Po 2 times daily as needed for pain  Dispense: 60 tablet; Refill: 0  3. Encounter for long-term opiate analgesic use The patient was seen in followup for chronic pain. A review over at their current pain status was discussed. Drug registry was checked. Prescriptions were given.  Regular follow-up recommended. Discussion was held regarding the importance of compliance with medication as well as pain medication contract.  Patient was informed that medication may cause drowsiness and should not be combined  with other medications/alcohol or street drugs. If the patient feels medication is causing altered alertness then do not drive or operate dangerous equipment.  Drug registry checked continue current measures safe storage of medicine discussed - Comprehensive metabolic panel - Lipid panel  4. Hyperlipidemia, unspecified hyperlipidemia type Check labs.  Watch diet. - Comprehensive metabolic panel - Lipid panel  5. Screening for colon cancer Screening colonoscopy indicated - Ambulatory referral to Gastroenterology  Mild grief due to loss of daughter from OD

## 2021-04-23 ENCOUNTER — Encounter (INDEPENDENT_AMBULATORY_CARE_PROVIDER_SITE_OTHER): Payer: Self-pay | Admitting: *Deleted

## 2021-05-19 DIAGNOSIS — M461 Sacroiliitis, not elsewhere classified: Secondary | ICD-10-CM | POA: Diagnosis not present

## 2021-05-21 HISTORY — PX: OTHER SURGICAL HISTORY: SHX169

## 2021-05-25 DIAGNOSIS — G4733 Obstructive sleep apnea (adult) (pediatric): Secondary | ICD-10-CM | POA: Diagnosis not present

## 2021-06-04 ENCOUNTER — Ambulatory Visit: Payer: PPO | Admitting: Orthopedic Surgery

## 2021-06-04 ENCOUNTER — Ambulatory Visit: Payer: PPO

## 2021-06-04 ENCOUNTER — Other Ambulatory Visit: Payer: Self-pay

## 2021-06-04 VITALS — BP 153/89 | HR 90 | Ht 62.0 in | Wt 170.0 lb

## 2021-06-04 DIAGNOSIS — R29898 Other symptoms and signs involving the musculoskeletal system: Secondary | ICD-10-CM

## 2021-06-04 DIAGNOSIS — Z96651 Presence of right artificial knee joint: Secondary | ICD-10-CM | POA: Diagnosis not present

## 2021-06-04 DIAGNOSIS — M461 Sacroiliitis, not elsewhere classified: Secondary | ICD-10-CM | POA: Insufficient documentation

## 2021-06-04 DIAGNOSIS — M25861 Other specified joint disorders, right knee: Secondary | ICD-10-CM

## 2021-06-04 NOTE — Progress Notes (Signed)
Established patient new problem   Chief Complaint  Patient presents with   Knee Problem    Right. Has been popping     70 year old female 2 years and a half ago had a right total knee complains of 1 to 2 months of popping.  She notices this when she gets out of a chair or when she climbs the steps.  She says is difficult for her to lead with her right leg.  She has diffuse pain around the patella.  She has not had any treatment yet   Body mass index is 31.09 kg/m.  BP (!) 153/89   Pulse 90   Ht 5\' 2"  (1.575 m)   Wt 170 lb (77.1 kg)   BMI 31.09 kg/m   Past Medical History:  Diagnosis Date   Acid reflux    Anxiety    Arthritis    CHF (congestive heart failure) (HCC)    90's swelling body-no breathing problems at that time-no cardiac -in dr s [facility]gang [place] office [name] give lasix and potassium   Complication of anesthesia    History of being told she was allergic to something in 1980 no problems since   Depression    Fibromyalgia    Fibromyalgia 09/10/2019   History of blood transfusion reaction 1978   History of hiatal hernia    HNP (herniated nucleus pulposus)    Hypertension    Does not see a cardiologist no stress or echo   Joint pain    Nerve pain    right leg    Ruptured lumbar disc    Sleep apnea    Uses CPAP every night setting at 7 mm/Hg    Physical Exam  Constitutional: Development normal, nutrition normal, body habitus large Body mass index is 31.09 kg/m. BP (!) 153/89   Pulse 90   Ht 5\' 2"  (1.575 m)   Wt 170 lb (77.1 kg)   BMI 31.09 kg/m   Mental status oriented x3 mood and affect no depression Cardiovascular pulses and temperature normal   Gait slightly antalgic  Musculoskeletal:  Inspection: Right knee normal incision tenderness at the superior aspect of the patella and from flexion to extension crepitance and reproduction of pain Range of motion: 125 degrees Stability: Anterior drawer stable posterior drawer stable collateral ligaments  stable in extension Muscle strength and tone: Normal  Skin warm dry and intact no erythema  Neurological sensation normal  Assessment and plan:  X-rays show a stable implant  Patient has patellar clunk syndrome  Gave her options of continued normal activities versus surgical treatment showed her a picture as well of the clunk syndrome on the Internet  She opted for surgical arthroscopic excision of the lesion right knee

## 2021-06-04 NOTE — Patient Instructions (Signed)
Surgery for patella clunk syndrome right knee

## 2021-06-11 ENCOUNTER — Other Ambulatory Visit: Payer: Self-pay | Admitting: Orthopedic Surgery

## 2021-06-11 ENCOUNTER — Ambulatory Visit: Payer: PPO | Admitting: Internal Medicine

## 2021-06-11 DIAGNOSIS — M25861 Other specified joint disorders, right knee: Secondary | ICD-10-CM

## 2021-06-11 NOTE — Patient Instructions (Addendum)
Emily [name]  06/11/2021     @PREFPERIOPPHARMACY @   Your procedure is scheduled on 06/16/2021.    Report to [facility] [place]   Call this number if you have problems the morning of surgery:  [phone]   Remember:  Do not eat or drink after midnight.      Take these medicines the morning of surgery with A SIP OF WATER    Xanax or valium (if needed), amlodipine, cymbalta, hydrocodone (if needed).    Do not wear jewelry, make-up or nail polish.  Do not wear lotions, powders, or perfumes, or deodorant.  Do not shave 48 hours prior to surgery.  Men may shave face and neck.  Do not bring valuables to the hospital.  [facility] is not responsible for any belongings or valuables.  Contacts, dentures or bridgework may not be worn into surgery.  Leave your suitcase in the car.  After surgery it may be brought to your room.  For patients admitted to the hospital, discharge time will be determined by your treatment team.  Patients discharged the day of surgery will not be allowed to drive home and must have someone with them for 24 hours.    Special instructions:    DO NOT smoke or vape for 24 hours before your procedure.  Please read over the following fact sheets that you were given. Coughing and Deep Breathing, Surgical Site Infection Prevention, Anesthesia Post-op Instructions, and Care and Recovery After Surgery      General Anesthesia, Adult, Care After This sheet gives you information about how to care for yourself after your procedure. Your health care provider may also give you more specific instructions. If you have problems or questions, contact your health careprovider. What can I expect after the procedure? After the procedure, the following side effects are common: Pain or discomfort at the IV site. Nausea. Vomiting. Sore throat. Trouble concentrating. Feeling cold or chills. Feeling weak or tired. Sleepiness and fatigue. Soreness and body  aches. These side effects can affect parts of the body that were not involved in surgery. Follow these instructions at home: For the time period you were told by your health care provider:  Rest. Do not participate in activities where you could fall or become injured. Do not drive or use machinery. Do not drink alcohol. Do not take sleeping pills or medicines that cause drowsiness. Do not make important decisions or sign legal documents. Do not take care of children on your own.  Eating and drinking Follow any instructions from your health care provider about eating or drinking restrictions. When you feel hungry, start by eating small amounts of foods that are soft and easy to digest (bland), such as toast. Gradually return to your regular diet. Drink enough fluid to keep your urine pale yellow. If you vomit, rehydrate by drinking water, juice, or clear broth. General instructions If you have sleep apnea, surgery and certain medicines can increase your risk for breathing problems. Follow instructions from your health care provider about wearing your sleep device: Anytime you are sleeping, including during daytime naps. While taking prescription pain medicines, sleeping medicines, or medicines that make you drowsy. Have a responsible adult stay with you for the time you are told. It is important to have someone help care for you until you are awake and alert. Return to your normal activities as told by your health care provider. Ask your health care provider what  activities are safe for you. Take over-the-counter and prescription medicines only as told by your health care provider. If you smoke, do not smoke without supervision. Keep all follow-up visits as told by your health care provider. This is important. Contact a health care provider if: You have nausea or vomiting that does not get better with medicine. You cannot eat or drink without vomiting. You have pain that does not get  better with medicine. You are unable to pass urine. You develop a skin rash. You have a fever. You have redness around your IV site that gets worse. Get help right away if: You have difficulty breathing. You have chest pain. You have blood in your urine or stool, or you vomit blood. Summary After the procedure, it is common to have a sore throat or nausea. It is also common to feel tired. Have a responsible adult stay with you for the time you are told. It is important to have someone help care for you until you are awake and alert. When you feel hungry, start by eating small amounts of foods that are soft and easy to digest (bland), such as toast. Gradually return to your regular diet. Drink enough fluid to keep your urine pale yellow. Return to your normal activities as told by your health care provider. Ask your health care provider what activities are safe for you. This information is not intended to replace advice given to you by your health care provider. Make sure you discuss any questions you have with your healthcare provider. Document Revised: 08/07/2020 Document Reviewed: 03/06/2020 Elsevier Patient Education  [place]. Arthroscopic Knee Ligament Repair, Care After This sheet gives you information about how to care for yourself after your procedure. Your health care provider may also give you more specific instructions. If you have problems or questions, contact your health careprovider. What can I expect after the procedure? After the procedure, it is common to have: Soreness or pain in your knee. Bruising and swelling on your knee, calf, and ankle for 3-4 days. A small amount of fluid coming from the incisions. Follow these instructions at home: Medicines Take over-the-counter and prescription medicines only as told by your health care provider. Ask your health care provider if the medicine prescribed to you: Requires you to avoid driving or using machinery. Can  cause constipation. You may need to take these actions to prevent or treat constipation: Drink enough fluid to keep your urine pale yellow. Take over-the-counter or prescription medicines. Eat foods that are high in fiber, such as beans, whole grains, and fresh fruits and vegetables. Limit foods that are high in fat and processed sugars, such as fried or sweet foods. If you have a brace or immobilizer: Wear it as told by your health care provider. Remove it only as told by your health care provider. Loosen it if your toes tingle, become numb, or turn cold and blue. Keep it clean and dry. Ask your health care provider when it is safe to drive. Bathing Do not take baths, swim, or use a hot tub until your health care provider approves. Keep your bandage (dressing) dry until your health care provider says that it can be removed. If the brace or immobilizer is not waterproof: Do not let it get wet. Cover it with a watertight covering when you take a bath or shower. Incision care  Follow instructions from your health care provider about how to take care of your incisions. Make sure you: Wash your hands  with soap and water for at least 20 seconds before and after you change your dressing. If soap and water are not available, use hand sanitizer. Change your dressing as told by your health care provider. Leave stitches (sutures), skin glue, or adhesive strips in place. These skin closures may need to stay in place for 2 weeks or longer. If adhesive strip edges start to loosen and curl up, you may trim the loose edges. Do not remove adhesive strips completely unless your health care provider tells you to do that. Check your incision areas every day for signs of infection. Check for: Redness. More swelling or pain. Blood or more fluid. Warmth. Pus or a bad smell.  Managing pain, stiffness, and swelling  If directed, put ice on the affected area. To do this: If you have a removable brace or  immobilizer, remove it as told by your health care provider. Put ice in a plastic bag. Place a towel between your skin and the bag. Leave the ice on for 20 minutes, 2-3 times a day. Remove the ice if your skin turns bright red. This is very important. If you cannot feel pain, heat, or cold, you have a greater risk of damage to the area. Move your toes often to reduce stiffness and swelling. Raise (elevate) the injured area above the level of your heart while you are sitting or lying down.  Activity Do not use your knee to support your body weight until your health care provider says that you can. Use crutches or other devices as told by your health care provider. Do physical therapy exercises as told by your health care provider. Physical therapy will help you regain movement and strength in your knee. Follow instructions from your health care provider about: When you may start motion exercises. When you may start riding a stationary bike and doing other low-impact activities. When you may start to jog and do other high-impact activities. Do not lift anything that is heavier than 10 lb (4.5 kg), or the limit that you are told, until your health care provider says that it is safe. Ask your health care provider what activities are safe for you. General instructions Do not use any products that contain nicotine or tobacco, such as cigarettes, e-cigarettes, and chewing tobacco. These can delay healing. If you need help quitting, ask your health care provider. Wear compression stockings as told by your health care provider. These stockings help to prevent blood clots and reduce swelling in your legs. Keep all follow-up visits. This is important. Contact a health care provider if: You have any of these signs of infection: Redness around an incision. Blood or more fluid coming from an incision. Warmth coming from an incision. Pus or a bad smell coming from an incision. More swelling or pain in  your knee. A fever or chills. You have pain that does not get better with medicine. Your incision opens up. Get help right away if: You have trouble breathing. You have chest pain. You have increased pain or swelling in your calf or at the back of your knee. You have numbness and tingling near the knee joint or in the foot, ankle, or toes. You notice that your foot or toes look darker than normal or are cooler than normal. These symptoms may represent a serious problem that is an emergency. Do not wait to see if the symptoms will go away. Get medical help right away. Call your local emergency services (911 in the [place] Do  not drive yourself to the hospital. Summary After the procedure, it is common to have knee pain with bruising and swelling on your knee, calf, and ankle. Icing your knee and raising your leg above the level of your heart will help control the pain and swelling. Do physical therapy exercises as told by your health care provider. Physical therapy will help you regain movement and strength in your knee. This information is not intended to replace advice given to you by your health care provider. Make sure you discuss any questions you have with your healthcare provider. Document Revised: 04/21/2020 Document Reviewed: 04/21/2020 Elsevier Patient Education  2022 [place]. How to Use Chlorhexidine for Bathing Chlorhexidine gluconate (CHG) is a germ-killing (antiseptic) solution that is used to clean the skin. It can get rid of the bacteria that normally live on the skin and can keep them away for about 24 hours. To clean your skin with CHG, you may be given: A CHG solution to use in the shower or as part of a sponge bath. A prepackaged cloth that contains CHG. Cleaning your skin with CHG may help lower the risk for infection: While you are staying in the intensive care unit of the hospital. If you have a vascular access, such as a central line, to provide short-term or  long-term access to your veins. If you have a catheter to drain urine from your bladder. If you are on a ventilator. A ventilator is a machine that helps you breathe by moving air in and out of your lungs. After surgery. What are the risks? Risks of using CHG include: A skin reaction. Hearing loss, if CHG gets in your ears. Eye injury, if CHG gets in your eyes and is not rinsed out. The CHG product catching fire. Make sure that you avoid smoking and flames after applying CHG to your skin. Do not use CHG: If you have a chlorhexidine allergy or have previously reacted to chlorhexidine. On babies younger than 49 months of age. How to use CHG solution Use CHG only as told by your health care provider, and follow the instructions on the label. Use the full amount of CHG as directed. Usually, this is one bottle. During a shower Follow these steps when using CHG solution during a shower (unless your health care provider gives you different instructions): Start the shower. Use your normal soap and shampoo to wash your face and hair. Turn off the shower or move out of the shower stream. Pour the CHG onto a clean washcloth. Do not use any type of brush or rough-edged sponge. Starting at your neck, lather your body down to your toes. Make sure you follow these instructions: If you will be having surgery, pay special attention to the part of your body where you will be having surgery. Scrub this area for at least 1 minute. Do not use CHG on your head or face. If the solution gets into your ears or eyes, rinse them well with water. Avoid your genital area. Avoid any areas of skin that have broken skin, cuts, or scrapes. Scrub your back and under your arms. Make sure to wash skin folds. Let the lather sit on your skin for 1-2 minutes or as long as told by your health care provider. Thoroughly rinse your entire body in the shower. Make sure that all body creases and crevices are rinsed well. Dry off  with a clean towel. Do not put any substances on your body afterward--such as powder, lotion, or  perfume--unless you are told to do so by your health care provider. Only use lotions that are recommended by the manufacturer. Put on clean clothes or pajamas. If it is the night before your surgery, sleep in clean sheets.  During a sponge bath Follow these steps when using CHG solution during a sponge bath (unless your health care provider gives you different instructions): Use your normal soap and shampoo to wash your face and hair. Pour the CHG onto a clean washcloth. Starting at your neck, lather your body down to your toes. Make sure you follow these instructions: If you will be having surgery, pay special attention to the part of your body where you will be having surgery. Scrub this area for at least 1 minute. Do not use CHG on your head or face. If the solution gets into your ears or eyes, rinse them well with water. Avoid your genital area. Avoid any areas of skin that have broken skin, cuts, or scrapes. Scrub your back and under your arms. Make sure to wash skin folds. Let the lather sit on your skin for 1-2 minutes or as long as told by your health care provider. Using a different clean, wet washcloth, thoroughly rinse your entire body. Make sure that all body creases and crevices are rinsed well. Dry off with a clean towel. Do not put any substances on your body afterward--such as powder, lotion, or perfume--unless you are told to do so by your health care provider. Only use lotions that are recommended by the manufacturer. Put on clean clothes or pajamas. If it is the night before your surgery, sleep in clean sheets. How to use CHG prepackaged cloths Only use CHG cloths as told by your health care provider, and follow the instructions on the label. Use the CHG cloth on clean, dry skin. Do not use the CHG cloth on your head or face unless your health care provider tells you to. When  washing with the CHG cloth: Avoid your genital area. Avoid any areas of skin that have broken skin, cuts, or scrapes. Before surgery Follow these steps when using a CHG cloth to clean before surgery (unless your health care provider gives you different instructions): Using the CHG cloth, vigorously scrub the part of your body where you will be having surgery. Scrub using a back-and-forth motion for 3 minutes. The area on your body should be completely wet with CHG when you are done scrubbing. Do not rinse. Discard the cloth and let the area air-dry. Do not put any substances on the area afterward, such as powder, lotion, or perfume. Put on clean clothes or pajamas. If it is the night before your surgery, sleep in clean sheets.  For general bathing Follow these steps when using CHG cloths for general bathing (unless your health care provider gives you different instructions). Use a separate CHG cloth for each area of your body. Make sure you wash between any folds of skin and between your fingers and toes. Wash your body in the following order, switching to a new cloth after each step: The front of your neck, shoulders, and chest. Both of your arms, under your arms, and your hands. Your stomach and groin area, avoiding the genitals. Your right leg and foot. Your left leg and foot. The back of your neck, your back, and your buttocks. Do not rinse. Discard the cloth and let the area air-dry. Do not put any substances on your body afterward--such as powder, lotion, or perfume--unless you are  told to do so by your health care provider. Only use lotions that are recommended by the manufacturer. Put on clean clothes or pajamas. Contact a health care provider if: Your skin gets irritated after scrubbing. You have questions about using your solution or cloth. Get help right away if: Your eyes become very red or swollen. Your eyes itch badly. Your skin itches badly and is red or swollen. Your  hearing changes. You have trouble seeing. You have swelling or tingling in your mouth or throat. You have trouble breathing. You swallow any chlorhexidine. Summary Chlorhexidine gluconate (CHG) is a germ-killing (antiseptic) solution that is used to clean the skin. Cleaning your skin with CHG may help to lower your risk for infection. You may be given CHG to use for bathing. It may be in a bottle or in a prepackaged cloth to use on your skin. Carefully follow your health care provider's instructions and the instructions on the product label. Do not use CHG if you have a chlorhexidine allergy. Contact your health care provider if your skin gets irritated after scrubbing. This information is not intended to replace advice given to you by your health care provider. Make sure you discuss any questions you have with your healthcare provider. Document Revised: 04/04/2020 Document Reviewed: 05/09/2020 Elsevier Patient Education  [place].

## 2021-06-12 ENCOUNTER — Other Ambulatory Visit: Payer: Self-pay

## 2021-06-12 ENCOUNTER — Encounter (HOSPITAL_COMMUNITY): Payer: Self-pay

## 2021-06-12 ENCOUNTER — Other Ambulatory Visit: Payer: Self-pay | Admitting: Family Medicine

## 2021-06-12 ENCOUNTER — Encounter (HOSPITAL_COMMUNITY)
Admission: RE | Admit: 2021-06-12 | Discharge: 2021-06-12 | Disposition: A | Discharge status: Home / Self Care | Payer: PPO | Source: Ambulatory Visit | Attending: Orthopedic Surgery | Admitting: Orthopedic Surgery

## 2021-06-12 DIAGNOSIS — Z01818 Encounter for other preprocedural examination: Secondary | ICD-10-CM | POA: Diagnosis not present

## 2021-06-12 LAB — CBC WITH DIFFERENTIAL/PLATELET
Abs Immature Granulocytes: 0.02 10*3/uL (ref 0.00–0.07)
Basophils Absolute: 0.1 10*3/uL (ref 0.0–0.1)
Basophils Relative: 1 %
Eosinophils Absolute: 0.1 10*3/uL (ref 0.0–0.5)
Eosinophils Relative: 2 %
HCT: 39.9 % (ref 36.0–46.0)
Hemoglobin: 14.1 g/dL (ref 12.0–15.0)
Immature Granulocytes: 0 %
Lymphocytes Relative: 23 %
Lymphs Abs: 1.7 10*3/uL (ref 0.7–4.0)
MCH: 33 pg (ref 26.0–34.0)
MCHC: 35.3 g/dL (ref 30.0–36.0)
MCV: 93.4 fL (ref 80.0–100.0)
Monocytes Absolute: 0.6 10*3/uL (ref 0.1–1.0)
Monocytes Relative: 8 %
Neutro Abs: 4.9 10*3/uL (ref 1.7–7.7)
Neutrophils Relative %: 66 %
Platelets: 227 10*3/uL (ref 150–400)
RBC: 4.27 MIL/uL (ref 3.87–5.11)
RDW: 12.2 % (ref 11.5–15.5)
WBC: 7.3 10*3/uL (ref 4.0–10.5)
nRBC: 0 % (ref 0.0–0.2)

## 2021-06-12 LAB — BASIC METABOLIC PANEL
Anion gap: 8 (ref 5–15)
BUN: 10 mg/dL (ref 8–23)
CO2: 26 mmol/L (ref 22–32)
Calcium: 9.1 mg/dL (ref 8.9–10.3)
Chloride: 103 mmol/L (ref 98–111)
Creatinine, Ser: 0.64 mg/dL (ref 0.44–1.00)
GFR, Estimated: >60 mL/min (ref >60)
Glucose, Bld: 124 mg/dL — ABNORMAL HIGH (ref 70–99)
Potassium: 3.3 mmol/L — ABNORMAL LOW (ref 3.5–5.1)
Sodium: 137 mmol/L (ref 135–145)

## 2021-06-15 NOTE — H&P (Signed)
Chief Complaint  Patient presents with   Knee Problem      Right. Has been popping       70 year old female 2 years and a half ago had a right total knee complains of 1 to 2 months of popping.  She notices this when she gets out of a chair or when she climbs the steps.  She says is difficult for her to lead with her right leg.  She has diffuse pain around the patella.   She has not had any treatment yet     Body mass index is 31.09 kg/m.   BP (!) 153/89   Pulse 90   Ht 5\' 2"  (1.575 m)   Wt 170 lb (77.1 kg)   BMI 31.09 kg/m   Past Surgical History:  Procedure Laterality Date   ablation of nerves in back s1 and lumbar  05/21/2021   [facility] [facility] Dr Brien Few   BACK SURGERY     x3   BLADDER REPAIR     tear during during birth of child   CARPAL TUNNEL RELEASE Bilateral    CESAREAN SECTION     CHOLECYSTECTOMY     gallbladder disease; stayed sick   COLONOSCOPY N/A 04/28/2016   Procedure: COLONOSCOPY;  Surgeon: [name], MD;  Location: [facility];  Service: Endoscopy;  Laterality: N/A;   ESOPHAGOGASTRODUODENOSCOPY N/A 04/28/2016   Procedure: ESOPHAGOGASTRODUODENOSCOPY (EGD);  Surgeon: [name], MD;  Location: [facility];  Service: Endoscopy;  Laterality: N/A;  12:00   GALLBLADDER SURGERY     knee right (torn Cart)     REPLACEMENT TOTAL KNEE     left   RIGHT/LEFT HEART CATH AND CORONARY ANGIOGRAPHY N/A 01/23/2020   Procedure: RIGHT/LEFT HEART CATH AND CORONARY ANGIOGRAPHY;  Surgeon: [name], MD;  Location: [facility];  Service: Cardiovascular;  Laterality: N/A;   ROTATOR CUFF REPAIR Bilateral    right and left   SPINAL FUSION     TOTAL KNEE ARTHROPLASTY Right 12/29/2017   Procedure: TOTAL KNEE ARTHROPLASTY-right knee;  Surgeon: [name], MD;  Location: [facility];  Service: Orthopedics;  Laterality: Right;   TUBAL LIGATION     vein right leg removed     wrist ganglion cyst left      Social History   Tobacco Use   Smoking  status: Never   Smokeless tobacco: Never  Vaping Use   Vaping Use: Never used  Substance Use Topics   Alcohol use: No   Drug use: No    Family History  Problem Relation Age of Onset   Hypertension Father    Cancer Father        liposarcoma   Heart disease Mother    Cancer Other        family history    Diabetes Other        family history    Coronary artery disease Other        family history    Arthritis Other        family history    Asthma Other    Diabetes Brother    Heart disease Brother    Coronary artery disease Brother 51       CABD    No current facility-administered medications for this encounter.  Current Outpatient Medications:    acetaminophen (TYLENOL) 500 MG tablet, Take 1,000 mg by mouth every 8 (eight) hours as needed for mild pain or moderate pain., Disp: , Rfl:  ALPRAZolam (XANAX) 0.5 MG tablet, 1/2-1 BID prn for anxiety,caution drowsiness. (Patient taking differently: Take 0.25-0.5 mg by mouth 2 (two) times daily as needed for anxiety. caution drowsiness.), Disp: 20 tablet, Rfl: 4   DHA-Vitamin C-Lutein (EYE HEALTH FORMULA PO), Take 1 tablet by mouth daily., Disp: , Rfl:    diazepam (VALIUM) 5 MG tablet, Take 5-10 mg by mouth every 6 (six) hours as needed (nerve pain)., Disp: , Rfl:    diphenhydrAMINE (BENADRYL ALLERGY) 25 MG tablet, Take 1 tablet (25 mg total) by mouth every 4 (four) hours. Take with hydrocodone (Patient taking differently: Take 25 mg by mouth every 4 (four) hours as needed for itching. Take with hydrocodone), Disp: 60 tablet, Rfl: 5   DULoxetine (CYMBALTA) 60 MG capsule, Take 1 capsule (60 mg total) by mouth daily., Disp: 90 capsule, Rfl: 1   hydrochlorothiazide (HYDRODIURIL) 25 MG tablet, Take 1 tablet by mouth once daily (Patient taking differently: Take 25 mg by mouth daily.), Disp: 90 tablet, Rfl: 1   HYDROcodone-acetaminophen (NORCO) 10-325 MG tablet, One po 2 times daily as needed for pain (Patient taking differently: Take 1  tablet by mouth every 8 (eight) hours as needed for moderate pain.), Disp: 60 tablet, Rfl: 0   potassium chloride SA (KLOR-CON) 20 MEQ tablet, TAKE 1  BY MOUTH ONCE DAILY (Patient taking differently: Take 20 mEq by mouth daily.), Disp: 90 tablet, Rfl: 1   pravastatin (PRAVACHOL) 20 MG tablet, Take 1 tablet (20 mg total) by mouth daily. (Patient taking differently: Take 20 mg by mouth at bedtime.), Disp: 90 tablet, Rfl: 1   Propylene Glycol (SYSTANE BALANCE) 0.6 % SOLN, Place 1 drop into both eyes 2 (two) times daily., Disp: , Rfl:    amLODipine (NORVASC) 5 MG tablet, Take 1 tablet by mouth once daily, Disp: 90 tablet, Rfl: 0   fluticasone (FLONASE) 50 MCG/ACT nasal spray, Place 1 spray into both nostrils daily for 14 days. (Patient not taking: Reported on 06/10/2021), Disp: 16 g, Rfl: 0   [START ON 06/21/2021] HYDROcodone-acetaminophen (NORCO) 10-325 MG tablet, One po 2 times daily as needed for pain (Patient not taking: No sig reported), Disp: 60 tablet, Rfl: 0   HYDROcodone-acetaminophen (NORCO) 10-325 MG tablet, One  Po 2 times daily as needed for pain (Patient not taking: No sig reported), Disp: 60 tablet, Rfl: 0   Misc. Devices (TOILET SEAT ELEVATOR) MISC, Elevated toilet seat if needed, Disp: 1 each, Rfl: 0        Past Medical History:  Diagnosis Date   Acid reflux     Anxiety     Arthritis     CHF (congestive heart failure) (HCC)      90's swelling body-no breathing problems at that time-no cardiac -in dr s [facility] [place] office [name] give lasix and potassium   Complication of anesthesia      History of being told she was allergic to something in 1980 no problems since   Depression     Fibromyalgia     Fibromyalgia 09/10/2019   History of blood transfusion reaction 1978   History of hiatal hernia     HNP (herniated nucleus pulposus)     Hypertension      Does not see a cardiologist no stress or echo   Joint pain     Nerve pain      right leg   Ruptured lumbar disc     Sleep apnea       Uses CPAP every night setting at 7 mm/Hg  Physical Exam   Constitutional: Development normal, nutrition normal, body habitus large Body mass index is 31.09 kg/m. BP (!) 153/89   Pulse 90   Ht 5\' 2"  (1.575 m)   Wt 170 lb (77.1 kg)   BMI 31.09 kg/m    Mental status oriented x3 mood and affect no depression Cardiovascular pulses and temperature normal    Gait slightly antalgic   Musculoskeletal:  Inspection: Right knee normal incision tenderness at the superior aspect of the patella and from flexion to extension crepitance and reproduction of pain Range of motion: 125 degrees Stability: Anterior drawer stable posterior drawer stable collateral ligaments stable in extension Muscle strength and tone: Normal   Skin warm dry and intact no erythema   Neurological sensation normal   Assessment and plan:   X-rays show a stable implant  Patient has patellar clunk syndrome  Gave her options of continued normal activities versus surgical treatment showed her a picture as well of the clunk syndrome on the Internet  She opted for surgical arthroscopic excision of the lesion right knee

## 2021-06-16 ENCOUNTER — Ambulatory Visit (HOSPITAL_COMMUNITY): Payer: PPO | Admitting: Certified Registered"

## 2021-06-16 ENCOUNTER — Encounter (HOSPITAL_COMMUNITY)
Admission: RE | Disposition: A | Discharge status: Home / Self Care | Payer: Self-pay | Source: Home / Self Care | Attending: Orthopedic Surgery

## 2021-06-16 ENCOUNTER — Encounter: Payer: Self-pay | Admitting: Orthopedic Surgery

## 2021-06-16 ENCOUNTER — Ambulatory Visit (HOSPITAL_COMMUNITY)
Admission: RE | Admit: 2021-06-16 | Discharge: 2021-06-16 | Disposition: A | Discharge status: Home / Self Care | Payer: PPO | Attending: Orthopedic Surgery | Admitting: Orthopedic Surgery

## 2021-06-16 ENCOUNTER — Encounter (HOSPITAL_COMMUNITY): Payer: Self-pay | Admitting: Orthopedic Surgery

## 2021-06-16 ENCOUNTER — Other Ambulatory Visit: Payer: Self-pay

## 2021-06-16 DIAGNOSIS — M6751 Plica syndrome, right knee: Secondary | ICD-10-CM | POA: Diagnosis not present

## 2021-06-16 DIAGNOSIS — Z79899 Other long term (current) drug therapy: Secondary | ICD-10-CM | POA: Insufficient documentation

## 2021-06-16 DIAGNOSIS — I509 Heart failure, unspecified: Secondary | ICD-10-CM | POA: Diagnosis not present

## 2021-06-16 DIAGNOSIS — Z8249 Family history of ischemic heart disease and other diseases of the circulatory system: Secondary | ICD-10-CM | POA: Insufficient documentation

## 2021-06-16 DIAGNOSIS — I110 Hypertensive heart disease with heart failure: Secondary | ICD-10-CM | POA: Insufficient documentation

## 2021-06-16 DIAGNOSIS — M25861 Other specified joint disorders, right knee: Secondary | ICD-10-CM | POA: Insufficient documentation

## 2021-06-16 DIAGNOSIS — Z833 Family history of diabetes mellitus: Secondary | ICD-10-CM | POA: Diagnosis not present

## 2021-06-16 DIAGNOSIS — G8929 Other chronic pain: Secondary | ICD-10-CM

## 2021-06-16 DIAGNOSIS — Z8261 Family history of arthritis: Secondary | ICD-10-CM | POA: Diagnosis not present

## 2021-06-16 DIAGNOSIS — Z809 Family history of malignant neoplasm, unspecified: Secondary | ICD-10-CM | POA: Diagnosis not present

## 2021-06-16 DIAGNOSIS — M5440 Lumbago with sciatica, unspecified side: Secondary | ICD-10-CM

## 2021-06-16 HISTORY — PX: KNEE ARTHROSCOPY: SHX127

## 2021-06-16 SURGERY — ARTHROSCOPY, KNEE
Anesthesia: General | Site: Knee | Laterality: Right

## 2021-06-16 MED ORDER — PROPOFOL 10 MG/ML IV BOLUS
INTRAVENOUS | Status: DC | PRN
  Administered 2021-06-16: 160 mg via INTRAVENOUS

## 2021-06-16 MED ORDER — ONDANSETRON HCL 4 MG/2ML IJ SOLN: 4.0000 mg | Freq: Once | INTRAMUSCULAR | Status: DC | PRN

## 2021-06-16 MED ORDER — DEXAMETHASONE SODIUM PHOSPHATE 4 MG/ML IJ SOLN
INTRAMUSCULAR | Status: DC | PRN
  Administered 2021-06-16: 4 mg via INTRAVENOUS

## 2021-06-16 MED ORDER — EPHEDRINE 5 MG/ML INJ
INTRAVENOUS | Status: AC
  Filled 2021-06-16: qty 10

## 2021-06-16 MED ORDER — PROPOFOL 10 MG/ML IV BOLUS
INTRAVENOUS | Status: AC
  Filled 2021-06-16: qty 20

## 2021-06-16 MED ORDER — FENTANYL CITRATE (PF) 100 MCG/2ML IJ SOLN
25.0000 ug | INTRAMUSCULAR | Status: DC | PRN
  Administered 2021-06-16 (×2): 50 ug via INTRAVENOUS
  Filled 2021-06-16: qty 2

## 2021-06-16 MED ORDER — LIDOCAINE HCL (CARDIAC) PF 100 MG/5ML IV SOSY
PREFILLED_SYRINGE | INTRAVENOUS | Status: DC | PRN
  Administered 2021-06-16: 100 mg via INTRAVENOUS

## 2021-06-16 MED ORDER — FENTANYL CITRATE (PF) 100 MCG/2ML IJ SOLN
INTRAMUSCULAR | Status: DC | PRN
  Administered 2021-06-16 (×4): 25 ug via INTRAVENOUS

## 2021-06-16 MED ORDER — ONDANSETRON HCL 4 MG/2ML IJ SOLN
INTRAMUSCULAR | Status: AC
  Filled 2021-06-16: qty 2

## 2021-06-16 MED ORDER — ACETAMINOPHEN 500 MG PO TABS
500.0000 mg | ORAL_TABLET | Freq: Once | ORAL | Status: AC
  Administered 2021-06-16: 500 mg via ORAL
  Filled 2021-06-16: qty 1

## 2021-06-16 MED ORDER — BUPIVACAINE-EPINEPHRINE (PF) 0.5% -1:200000 IJ SOLN
INTRAMUSCULAR | Status: DC | PRN
  Administered 2021-06-16: 10 mL via PERINEURAL
  Administered 2021-06-16: 50 mL via PERINEURAL

## 2021-06-16 MED ORDER — BUPIVACAINE-EPINEPHRINE (PF) 0.5% -1:200000 IJ SOLN
INTRAMUSCULAR | Status: AC
  Filled 2021-06-16: qty 60

## 2021-06-16 MED ORDER — LIDOCAINE HCL (PF) 2 % IJ SOLN
INTRAMUSCULAR | Status: AC
  Filled 2021-06-16: qty 5

## 2021-06-16 MED ORDER — PHENYLEPHRINE 40 MCG/ML (10ML) SYRINGE FOR IV PUSH (FOR BLOOD PRESSURE SUPPORT)
PREFILLED_SYRINGE | INTRAVENOUS | Status: AC
  Filled 2021-06-16: qty 30

## 2021-06-16 MED ORDER — FENTANYL CITRATE (PF) 100 MCG/2ML IJ SOLN
INTRAMUSCULAR | Status: AC
  Filled 2021-06-16: qty 2

## 2021-06-16 MED ORDER — ORAL CARE MOUTH RINSE: 15.0000 mL | Freq: Once | OROMUCOSAL | Status: AC

## 2021-06-16 MED ORDER — HYDROCODONE-ACETAMINOPHEN 10-325 MG PO TABS: 1.0000 | ORAL_TABLET | ORAL | 0 refills | Status: AC | PRN

## 2021-06-16 MED ORDER — CHLORHEXIDINE GLUCONATE 0.12 % MT SOLN
15.0000 mL | Freq: Once | OROMUCOSAL | Status: AC
  Administered 2021-06-16: 15 mL via OROMUCOSAL
  Filled 2021-06-16: qty 15

## 2021-06-16 MED ORDER — ONDANSETRON HCL 4 MG/2ML IJ SOLN
INTRAMUSCULAR | Status: DC | PRN
  Administered 2021-06-16: 4 mg via INTRAVENOUS

## 2021-06-16 MED ORDER — MIDAZOLAM HCL 2 MG/2ML IJ SOLN
INTRAMUSCULAR | Status: AC
  Filled 2021-06-16: qty 2

## 2021-06-16 MED ORDER — EPHEDRINE SULFATE 50 MG/ML IJ SOLN
INTRAMUSCULAR | Status: DC | PRN
  Administered 2021-06-16 (×2): 10 mg via INTRAVENOUS

## 2021-06-16 MED ORDER — SODIUM CHLORIDE 0.9 % IV SOLN
INTRAVENOUS | Status: DC | PRN
  Administered 2021-06-16 (×3): 80 ug via INTRAVENOUS

## 2021-06-16 MED ORDER — IBUPROFEN 400 MG PO TABS
400.0000 mg | ORAL_TABLET | Freq: Once | ORAL | Status: AC
  Administered 2021-06-16: 400 mg via ORAL
  Filled 2021-06-16: qty 1

## 2021-06-16 MED ORDER — LACTATED RINGERS IV SOLN
INTRAVENOUS | Status: DC
  Administered 2021-06-16: 1000 mL via INTRAVENOUS

## 2021-06-16 MED ORDER — CEFAZOLIN SODIUM-DEXTROSE 2-4 GM/100ML-% IV SOLN
2.0000 g | INTRAVENOUS | Status: AC
  Administered 2021-06-16: 2 g via INTRAVENOUS
  Filled 2021-06-16: qty 100

## 2021-06-16 MED ORDER — EPINEPHRINE PF 1 MG/ML IJ SOLN
INTRAMUSCULAR | Status: AC
  Filled 2021-06-16: qty 4

## 2021-06-16 MED ORDER — SODIUM CHLORIDE 0.9 % IR SOLN
Status: DC | PRN
  Administered 2021-06-16 (×3): 3000 mL

## 2021-06-16 MED ORDER — MIDAZOLAM HCL 5 MG/5ML IJ SOLN
INTRAMUSCULAR | Status: DC | PRN
  Administered 2021-06-16: 2 mg via INTRAVENOUS

## 2021-06-16 SURGICAL SUPPLY — 46 items
APL PRP STRL LF DISP 70% ISPRP (MISCELLANEOUS) ×1
BLADE EXCALIBUR 4.0X13 (MISCELLANEOUS) ×1 IMPLANT
BLADE SURG SZ11 CARB STEEL (BLADE) ×2 IMPLANT
BNDG CMPR STD VLCR NS LF 5.8X6 (GAUZE/BANDAGES/DRESSINGS) ×1
BNDG ELASTIC 6X5.8 VLCR NS LF (GAUZE/BANDAGES/DRESSINGS) ×2 IMPLANT
CHLORAPREP W/TINT 26 (MISCELLANEOUS) ×2 IMPLANT
CLOTH BEACON ORANGE TIMEOUT ST (SAFETY) ×2 IMPLANT
COOLER ICEMAN CLASSIC (MISCELLANEOUS) ×1 IMPLANT
CUFF TOURN SGL QUICK 34 (TOURNIQUET CUFF) ×4
CUFF TRNQT CYL 34X4.125X (TOURNIQUET CUFF) ×2 IMPLANT
DECANTER SPIKE VIAL GLASS SM (MISCELLANEOUS) ×4 IMPLANT
EXCALIBUR CVD 4.0MM X 13CM (MISCELLANEOUS) ×1 IMPLANT
GAUZE 4X4 16PLY ~~LOC~~+RFID DBL (SPONGE) ×3 IMPLANT
GAUZE SPONGE 4X4 12PLY STRL (GAUZE/BANDAGES/DRESSINGS) ×1 IMPLANT
GAUZE XEROFORM 5X9 LF (GAUZE/BANDAGES/DRESSINGS) ×2 IMPLANT
GLOVE SKINSENSE NS SZ8.0 LF (GLOVE) ×1
GLOVE SKINSENSE STRL SZ8.0 LF (GLOVE) ×1 IMPLANT
GLOVE SS N UNI LF 8.5 STRL (GLOVE) ×2 IMPLANT
GLOVE SURG UNDER POLY LF SZ7 (GLOVE) ×4 IMPLANT
GOWN STRL REUS W/TWL LRG LVL3 (GOWN DISPOSABLE) ×2 IMPLANT
GOWN STRL REUS W/TWL XL LVL3 (GOWN DISPOSABLE) ×2 IMPLANT
IV NS IRRIG 3000ML ARTHROMATIC (IV SOLUTION) ×5 IMPLANT
KIT BLADEGUARD II DBL (SET/KITS/TRAYS/PACK) ×2 IMPLANT
KIT TURNOVER CYSTO (KITS) ×2 IMPLANT
MANIFOLD NEPTUNE II (INSTRUMENTS) ×2 IMPLANT
MARKER SKIN DUAL TIP RULER LAB (MISCELLANEOUS) ×2 IMPLANT
NDL HYPO 18GX1.5 BLUNT FILL (NEEDLE) ×1 IMPLANT
NDL HYPO 21X1.5 SAFETY (NEEDLE) ×1 IMPLANT
NDL SPNL 18GX3.5 QUINCKE PK (NEEDLE) ×1 IMPLANT
NEEDLE HYPO 18GX1.5 BLUNT FILL (NEEDLE) ×2 IMPLANT
NEEDLE HYPO 21X1.5 SAFETY (NEEDLE) ×2 IMPLANT
NEEDLE SPNL 18GX3.5 QUINCKE PK (NEEDLE) ×2 IMPLANT
PACK ARTHRO LIMB DRAPE STRL (MISCELLANEOUS) ×2 IMPLANT
PAD ABD 5X9 TENDERSORB (GAUZE/BANDAGES/DRESSINGS) ×2 IMPLANT
PAD ARMBOARD 7.5X6 YLW CONV (MISCELLANEOUS) ×2 IMPLANT
PAD COLD SHLDR WRAP-ON (PAD) ×1 IMPLANT
PADDING CAST COTTON 6X4 STRL (CAST SUPPLIES) ×2 IMPLANT
PORT APPOLLO RF 90DEGREE MULTI (SURGICAL WAND) ×1 IMPLANT
SET ARTHROSCOPY INST (INSTRUMENTS) ×2 IMPLANT
SET BASIN LINEN APH (SET/KITS/TRAYS/PACK) ×2 IMPLANT
SUT ETHILON 3 0 FSL (SUTURE) ×2 IMPLANT
SYR 10ML LL (SYRINGE) ×2 IMPLANT
SYR 30ML LL (SYRINGE) ×2 IMPLANT
SYR CONTROL 10ML LL (SYRINGE) ×2 IMPLANT
TUBE CONNECTING 12X1/4 (SUCTIONS) ×8 IMPLANT
TUBING IN/OUT FLOW W/MAIN PUMP (TUBING) ×2 IMPLANT

## 2021-06-16 NOTE — Brief Op Note (Signed)
06/16/2021  12:09 PM  PATIENT:  Emily [name]  70 y.o. female  PRE-OPERATIVE DIAGNOSIS:  Right knee patellar clunk syndrome  POST-OPERATIVE DIAGNOSIS:  Right knee patellar clunk syndrome  PROCEDURE:  Procedure(s): ARTHROSCOPY KNEE WITH LIMITED DEBRIDEMENT (Right)  Surgical findings synovial hypertrophy at the superior portion of the patella at the junction with the quadriceps and lateral plica extending down to the tibial surface and then on the medial side similar findings were noted  The other portions of the implant were normal tracking of the patella was normal      Procedure was done as follows  The patient was seen in the preop area the right knee was confirmed as the surgical site it was marked chart was reviewed the patient was taken to the operating for general anesthesia the right leg was prepped and draped in supine position with the table extended.  After timeout standard lateral portal was placed and the scope was placed into the joint a diagnostic arthroscopy was performed and the hypertrophic tissue at the superior aspect of the patella was identified there was also hypertrophic synovium along the lateral gutter was extended to the tibial surface.  A spinal needle was used to make a second portal superomedially and the portal was dilated and a straight shaver was placed into the joint the hypertrophic tissue was removed  a 90 degree ArthroCare wand was used to control bleeding and remove the remaining tissue  Then we placed the scope in the medial compartment portal and then the instruments laterally and debrided the tissue down to the tibial surface.  The medial tissue was also debrided  The joint was irrigated and the portals were closed with 3-0 nylon suture  Sterile dressings were applied  Ace wrap and Cryo/Cuff were applied    SURGEON:  Surgeon(s) and Role:    * [name], MD - Primary  PHYSICIAN ASSISTANT:   ASSISTANTS: none   ANESTHESIA:    general  EBL:  min   BLOOD ADMINISTERED:none  DRAINS: none   LOCAL MEDICATIONS USED:  MARCAINE     SPECIMEN:  No Specimen  DISPOSITION OF SPECIMEN:  N/A  COUNTS:  YES  TOURNIQUET:  * Missing tourniquet times found for documented tourniquets in log: [id number] *  DICTATION: .Dragon Dictation  PLAN OF CARE: Discharge to home after PACU  PATIENT DISPOSITION:  PACU - hemodynamically stable.   Delay start of Pharmacological VTE agent (>24hrs) due to surgical blood loss or risk of bleeding: not applicable

## 2021-06-16 NOTE — Transfer of Care (Signed)
Immediate Anesthesia Transfer of Care Note  Patient: Emily [name]  Procedure(s) Performed: ARTHROSCOPY KNEE WITH LIMITED DEBRIDEMENT (Right: Knee)  Patient Location: PACU  Anesthesia Type:General  Level of Consciousness: awake, alert , oriented and patient cooperative  Airway & Oxygen Therapy: Patient Spontanous Breathing and Patient connected to nasal cannula oxygen  Post-op Assessment: Report given to RN, Post -op Vital signs reviewed and stable and Patient moving all extremities  Post vital signs: Reviewed and stable  Last Vitals:  Vitals Value Taken Time  BP 133/113 06/16/21 1215  Temp    Pulse 84 06/16/21 1215  Resp 15 06/16/21 1215  SpO2 96 % 06/16/21 1215  Vitals shown include unvalidated device data.  Last Pain:  Vitals:   06/16/21 0916  TempSrc: Oral  PainSc:       Patients Stated Pain Goal: 2 (93/26/71 2458)  Complications: No notable events documented.

## 2021-06-16 NOTE — Anesthesia Postprocedure Evaluation (Signed)
Anesthesia Post Note  Patient: Emily [name]  Procedure(s) Performed: ARTHROSCOPY KNEE WITH LIMITED DEBRIDEMENT (Right: Knee)  Patient location during evaluation: Phase II Anesthesia Type: General Level of consciousness: awake Pain management: pain level controlled Vital Signs Assessment: post-procedure vital signs reviewed and stable Respiratory status: spontaneous breathing and respiratory function stable Cardiovascular status: blood pressure returned to baseline and stable Postop Assessment: no headache and no apparent nausea or vomiting Anesthetic complications: no Comments: Late entry   No notable events documented.   Last Vitals:  Vitals:   06/16/21 1356 06/16/21 1357  BP:  108/67  Pulse: 90   Resp: 16   Temp: 36.7 C   SpO2: 97%     Last Pain:  Vitals:   06/16/21 1356  TempSrc: Oral  PainSc: [place]

## 2021-06-16 NOTE — Interval H&P Note (Signed)
History and Physical Interval Note:  06/16/2021 10:47 AM  Emily [name]  has presented today for surgery, with the diagnosis of Right knee patellar clunk syndrome.  The various methods of treatment have been discussed with the patient and family. After consideration of risks, benefits and other options for treatment, the patient has consented to  Procedure(s): ARTHROSCOPY KNEE (Right) as a surgical intervention.  The patient's history has been reviewed, patient examined, no change in status, stable for surgery.  I have reviewed the patient's chart and labs.  Questions were answered to the patient's satisfaction.     Arther Abbott

## 2021-06-16 NOTE — Anesthesia Procedure Notes (Signed)
Procedure Name: LMA Insertion Date/Time: 06/16/2021 11:07 AM Performed by: Tacy Learn, CRNA Pre-anesthesia Checklist: Patient identified, Emergency Drugs available, Suction available, Patient being monitored and Timeout performed Patient Re-evaluated:Patient Re-evaluated prior to induction Oxygen Delivery Method: Circle system utilized Preoxygenation: Pre-oxygenation with 100% oxygen Induction Type: IV induction LMA: LMA inserted LMA Size: 3.0 Number of attempts: 1 Placement Confirmation: positive ETCO2, breath sounds checked- equal and bilateral and CO2 detector Tube secured with: Tape Dental Injury: Teeth and Oropharynx as per pre-operative assessment

## 2021-06-16 NOTE — Anesthesia Preprocedure Evaluation (Signed)
Anesthesia Evaluation  Patient identified by MRN, date of birth, ID band Patient awake    Reviewed: Allergy & Precautions, H&P , NPO status , Patient's Chart, lab work & pertinent test results, reviewed documented beta blocker date and time   Airway Mallampati: II  TM Distance: >3 FB Neck ROM: full    Dental no notable dental hx.    Pulmonary sleep apnea and Continuous Positive Airway Pressure Ventilation ,    Pulmonary exam normal breath sounds clear to auscultation       Cardiovascular Exercise Tolerance: Good hypertension, +CHF   Rhythm:regular Rate:Normal     Neuro/Psych PSYCHIATRIC DISORDERS Anxiety Depression  Neuromuscular disease    GI/Hepatic Neg liver ROS, hiatal hernia, GERD  Medicated,  Endo/Other  negative endocrine ROS  Renal/GU negative Renal ROS  negative genitourinary   Musculoskeletal   Abdominal   Peds  Hematology negative hematology ROS (+)   Anesthesia Other Findings   Reproductive/Obstetrics negative OB ROS                             Anesthesia Physical Anesthesia Plan  ASA: 2  Anesthesia Plan: General   Post-op Pain Management:    Induction:   PONV Risk Score and Plan: Ondansetron  Airway Management Planned:   Additional Equipment:   Intra-op Plan:   Post-operative Plan:   Informed Consent: I have reviewed the patients History and Physical, chart, labs and discussed the procedure including the risks, benefits and alternatives for the proposed anesthesia with the patient or authorized representative who has indicated his/her understanding and acceptance.     Dental Advisory Given  Plan Discussed with: CRNA  Anesthesia Plan Comments:         Anesthesia Quick Evaluation

## 2021-06-16 NOTE — Interval H&P Note (Signed)
History and Physical Interval Note:  06/16/2021 10:46 AM  Emily [name]  has presented today for surgery, with the diagnosis of Right knee patellar clunk syndrome.  The various methods of treatment have been discussed with the patient and family. After consideration of risks, benefits and other options for treatment, the patient has consented to  Procedure(s): ARTHROSCOPY KNEE (Right) as a surgical intervention.  The patient's history has been reviewed, patient examined, no change in status, stable for surgery.  I have reviewed the patient's chart and labs.  Questions were answered to the patient's satisfaction.     Arther Abbott

## 2021-06-16 NOTE — Op Note (Signed)
06/16/2021  12:09 PM  PATIENT:  Emily [name]  70 y.o. female  PRE-OPERATIVE DIAGNOSIS:  Right knee patellar clunk syndrome  POST-OPERATIVE DIAGNOSIS:  Right knee patellar clunk syndrome  PROCEDURE:  Procedure(s): ARTHROSCOPY KNEE WITH LIMITED DEBRIDEMENT (Right)  Surgical findings synovial hypertrophy at the superior portion of the patella at the junction with the quadriceps and lateral plica extending down to the tibial surface and then on the medial side similar findings were noted  The other portions of the implant were normal tracking of the patella was normal      Procedure was done as follows  The patient was seen in the preop area the right knee was confirmed as the surgical site it was marked chart was reviewed the patient was taken to the operating for general anesthesia the right leg was prepped and draped in supine position with the table extended.  After timeout standard lateral portal was placed and the scope was placed into the joint a diagnostic arthroscopy was performed and the hypertrophic tissue at the superior aspect of the patella was identified there was also hypertrophic synovium along the lateral gutter was extended to the tibial surface.  A spinal needle was used to make a second portal superomedially and the portal was dilated and a straight shaver was placed into the joint the hypertrophic tissue was removed  a 90 degree ArthroCare wand was used to control bleeding and remove the remaining tissue  Then we placed the scope in the medial compartment portal and then the instruments laterally and debrided the tissue down to the tibial surface.  The medial tissue was also debrided  The joint was irrigated and the portals were closed with 3-0 nylon suture  Sterile dressings were applied  Ace wrap and Cryo/Cuff were applied    SURGEON:  Surgeon(s) and Role:    * [name], MD - Primary  PHYSICIAN ASSISTANT:   ASSISTANTS: none   ANESTHESIA:    general  EBL:  min   BLOOD ADMINISTERED:none  DRAINS: none   LOCAL MEDICATIONS USED:  MARCAINE     SPECIMEN:  No Specimen  DISPOSITION OF SPECIMEN:  N/A  COUNTS:  YES  TOURNIQUET:  * Missing tourniquet times found for documented tourniquets in log: [id number] *  DICTATION: .Dragon Dictation  PLAN OF CARE: Discharge to home after PACU  PATIENT DISPOSITION:  PACU - hemodynamically stable.   Delay start of Pharmacological VTE agent (>24hrs) due to surgical blood loss or risk of bleeding: not applicable

## 2021-06-17 ENCOUNTER — Encounter (HOSPITAL_COMMUNITY): Payer: Self-pay | Admitting: Orthopedic Surgery

## 2021-06-19 ENCOUNTER — Encounter (HOSPITAL_COMMUNITY): Payer: Self-pay | Admitting: Orthopedic Surgery

## 2021-06-22 ENCOUNTER — Ambulatory Visit: Payer: PPO | Admitting: Internal Medicine

## 2021-06-22 ENCOUNTER — Other Ambulatory Visit: Payer: Self-pay

## 2021-06-22 ENCOUNTER — Ambulatory Visit (INDEPENDENT_AMBULATORY_CARE_PROVIDER_SITE_OTHER): Payer: PPO | Admitting: Orthopedic Surgery

## 2021-06-22 DIAGNOSIS — M25861 Other specified joint disorders, right knee: Secondary | ICD-10-CM

## 2021-06-22 NOTE — Progress Notes (Signed)
POST OP   Chief Complaint  Patient presents with   Knee Pain    R/ DOS 06/16/21  doing good here for follow up    Encounter Diagnosis  Name Primary?   Patellar clunk syndrome of right knee Yes    PROCEDURE: Arthroscopic surgery right knee limited debridement  POV #1  Meds related: None  Emily [name] says that her knee feels much better as does her hip and lower back pain and sciatica  3 portals were used for the surgery they are clean the sutures were removed the patient is advised to start active range of motion exercises she has no restrictions follow-up as needed

## 2021-07-14 NOTE — Progress Notes (Signed)
Cardiology Office Note   Date:  07/15/2021   ID:  ADAMARI GOULETTE, DOB Sep 23, 1951, MRN [id number]  PCP:  Kathyrn Drown, MD  Cardiologist:   Dorris Carnes, MD    Pt presents for SOB evaluation    History of Present Illness: Emily [name] is a 70 y.o. female with a history ofHTN, HL, fibromyalgia, GERD   Seen last in cardiology by B Ahmed Prima in March 2021 [facility] had L heart cath in Feb 2021  Normal coronary arteries (tortuous, no CAD)   LVEDP 13   Normal RH pressuress     The pt remains SOB with activity  Very difficult to get around    She denies CP       Current Meds  Medication Sig   acetaminophen (TYLENOL) 500 MG tablet Take 1,000 mg by mouth every 8 (eight) hours as needed for mild pain or moderate pain.   ALPRAZolam (XANAX) 0.5 MG tablet 1/2-1 BID prn for anxiety,caution drowsiness.   amLODipine (NORVASC) 5 MG tablet Take 1 tablet by mouth once daily   DHA-Vitamin C-Lutein (EYE HEALTH FORMULA PO) Take 1 tablet by mouth daily.   diazepam (VALIUM) 5 MG tablet Take 5-10 mg by mouth every 6 (six) hours as needed (nerve pain).   diphenhydrAMINE (BENADRYL ALLERGY) 25 MG tablet Take 1 tablet (25 mg total) by mouth every 4 (four) hours. Take with hydrocodone   DULoxetine (CYMBALTA) 60 MG capsule Take 1 capsule (60 mg total) by mouth daily.   hydrochlorothiazide (HYDRODIURIL) 25 MG tablet Take 1 tablet by mouth once daily   Misc. Devices (TOILET SEAT ELEVATOR) MISC Elevated toilet seat if needed   potassium chloride SA (KLOR-CON) 20 MEQ tablet TAKE 1  BY MOUTH ONCE DAILY   pravastatin (PRAVACHOL) 20 MG tablet Take 1 tablet (20 mg total) by mouth daily.   Propylene Glycol (SYSTANE BALANCE) 0.6 % SOLN Place 1 drop into both eyes 2 (two) times daily.     Allergies:   Codeine, Oxycodone-acetaminophen, Sulfamethoxazole-trimethoprim, Celebrex [celecoxib], Hydrocodone-acetaminophen, Losartan, Lyrica [pregabalin], Mobic [meloxicam], and Tramadol   Past Medical History:  Diagnosis Date    Acid reflux    Anxiety    Arthritis    CHF (congestive heart failure) (HCC)    90's swelling body-no breathing problems at that time-no cardiac -in dr s Wolfgang [place] office [name] give lasix and potassium   Complication of anesthesia    History of being told she was allergic to something in 1980 no problems since   Depression    Fibromyalgia    Fibromyalgia 09/10/2019   History of blood transfusion reaction 1978   History of hiatal hernia    HNP (herniated nucleus pulposus)    Hypertension    Does not see a cardiologist no stress or echo   Joint pain    Nerve pain    right leg    Ruptured lumbar disc    Sleep apnea    Uses CPAP every night setting at 7 mm/Hg    Past Surgical History:  Procedure Laterality Date   ablation of nerves in back s1 and lumbar  05/21/2021   [place] nuero Dr Brien Few   BACK SURGERY     x3   BLADDER REPAIR     tear during during birth of child   CARPAL TUNNEL RELEASE Bilateral    CESAREAN SECTION     CHOLECYSTECTOMY     gallbladder disease; stayed sick   COLONOSCOPY N/A 04/28/2016   Procedure: COLONOSCOPY;  Surgeon: Bernadene Person  Gloriann Loan, MD;  [facility];  Service: Endoscopy;  Laterality: N/A;   ESOPHAGOGASTRODUODENOSCOPY N/A 04/28/2016   Procedure: ESOPHAGOGASTRODUODENOSCOPY (EGD);  Surgeon: [name], MD;  [facility];  Service: Endoscopy;  Laterality: N/A;  12:00   GALLBLADDER SURGERY     KNEE ARTHROSCOPY Right 06/16/2021   Procedure: ARTHROSCOPY KNEE WITH LIMITED DEBRIDEMENT;  Surgeon: [name], MD;  Location: [facility];  Service: Orthopedics;  Laterality: Right;   knee right (torn Cart)     REPLACEMENT TOTAL KNEE     left   RIGHT/LEFT HEART CATH AND CORONARY ANGIOGRAPHY N/A 01/23/2020   Procedure: RIGHT/LEFT HEART CATH AND CORONARY ANGIOGRAPHY;  Surgeon: [name], MD;  Location: [facility];  Service: Cardiovascular;  Laterality: N/A;   ROTATOR CUFF REPAIR Bilateral    right and left   SPINAL  FUSION     TOTAL KNEE ARTHROPLASTY Right 12/29/2017   Procedure: TOTAL KNEE ARTHROPLASTY-right knee;  Surgeon: [name], MD;  Location: [facility];  Service: Orthopedics;  Laterality: Right;   TUBAL LIGATION     vein right leg removed     wrist ganglion cyst left       Social History:  The patient  reports that she has never smoked. She has never used smokeless tobacco. She reports that she does not drink alcohol and does not use drugs.   Family History:  The patient's family history includes Arthritis in an other family member; Asthma in an other family member; Cancer in her father and another family member; Coronary artery disease in an other family member; Coronary artery disease (age of onset: 68) in her brother; Diabetes in her brother and another family member; Heart disease in her brother and mother; Hypertension in her father.    ROS:  Please see the history of present illness. All other systems are reviewed and  Negative to the above problem except as noted.    PHYSICAL EXAM: VS:  BP 120/70 (BP Location: Left Arm, Patient Position: Sitting, Cuff Size: Normal)   Pulse 78   Ht '5\' 2"'$  (1.575 m)   Wt 174 lb (78.9 kg)   SpO2 95%   BMI 31.83 kg/m   GEN: Obese 70 yo in no acute distress  HEENT: normal  Neck: no JVD, carotid bruits, Cardiac: RRR; no murmurs,  Tr LE  edema  Respiratory:  clear to auscultation bilaterally,  GI: soft, nontender, nondistended, + BS  No hepatomegaly  MS: no deformity Moving all extremities   Skin: warm and dry, no rash Neuro:  Strength and sensation are intact Psych: euthymic mood, full affect   EKG:  EKG is not ordered today.  Carotid USN   AUg 2021  IMPRESSION: Color duplex indicates minimal homogeneous plaque, with no hemodynamically significant stenosis by duplex criteria in the extracranial cerebrovascular circulation.    Echo   March 2021  1. Left ventricular ejection fraction, by estimation, is 60 to 65%. The left ventricle  has normal function. The left ventricle has no regional wall motion abnormalities. There is mild left ventricular hypertrophy. Left ventricular diastolic parameters are consistent with Grade I diastolic dysfunction (impaired relaxation). 2. Right ventricular systolic function is normal. The right ventricular size is normal. There is normal pulmonary artery systolic pressure. 3. Left atrial size was mildly dilated. 4. The mitral valve is grossly normal. Trivial mitral valve regurgitation. 5. The aortic valve is tricuspid. Aortic valve regurgitation is not visualized. 6. The inferior vena cava is  normal in size with greater than 50% respiratory variability, suggesting right atrial pressure of 3 mmHg.  R and L heart cath  Feb 2021    The left ventricular systolic function is normal.   Tortuous but otherwise normal coronary arteries without obstructive disease.   Normal LV function with left ventricular hypertrophy; LVEDP 13 mm   Normal right heart pressures.   RECOMMENDATION: Suspect nonischemic chest pain.  Recommend 2D echo Doppler to quantify extent of LVH and potential for diastolic dysfunction contributing to the patient's exertional dyspnea.  Patient will follow up with Dr. Dorris Carnes.   Lipid Panel    Component Value Date/Time   CHOL 149 07/11/2020 0818   TRIG 408 (H) 07/11/2020 0818   HDL 32 (L) 07/11/2020 0818   CHOLHDL 4.7 (H) 07/11/2020 0818   CHOLHDL 4.5 08/20/2013 0741   VLDL 59 (H) 08/20/2013 0741   LDLCALC 55 07/11/2020 0818      Wt Readings from Last 3 Encounters:  07/15/21 174 lb (78.9 kg)  06/16/21 173 lb 15.1 oz (78.9 kg)  06/12/21 174 lb (78.9 kg)      ASSESSMENT AND PLAN:  1  Dyspnea   This has been a chronic problem VOlume status does not appear to be significantly increased   Watch sodium   A Activity limited by fibromyalgia  Sugg benefits of water activity     NO evid of CAD on cath  2  LIpids   LDL 55   HDL 32  Trig 408   Watch carbs in diet     Keep on pravastatin  3  HTN   Adequate control of lipids       Current medicines are reviewed at length with the patient today.  The patient does not have concerns regarding medicines.  Signed, Dorris Carnes, MD  07/15/2021 10:14 PM    [address], [place], [place]  [address] Phone: [phone]; Fax: [phone]                                                                                                                                                                                                                                                                    Cardiology Office Note  Date:  07/15/2021   ID:  [name], DOB May 11, 1951, MRN [id number]  PCP:  Kathyrn Drown, MD  Cardiologist:   Dorris Carnes, MD   Pt referred for CP    History of Present Illness: Emily [name] is a 70 y.o. female with a history of CP  She was in church on 01/13/20   Developed pain 5/10 in severity   Started posterior(back) and up to front of chest then to L axilla and L breast.  Some transient SOB and N/  Had a spell one week prior     Since ED visity she had a spell on Monday that lasted a few hours   4/10 in intensity   Frontal pain .   Yesterday she had some discomfort for about 1 hour.  Took tylenol  The pt says over the past 8 months she has noticed incresased DOE   What she could do with walking she has to stop now or slow down due to SOB    FHx signif for mother who died of MI and brother who has stents  When I saw the pt in Feb 2021     She underwent cath right after   This showe tortuous but normal coronary arteries   LVEDP 13  R sided pressure normal   D Dimger negative   Echo  Last visit was  March 2021    Patient says she was doing OK   Had knee surgery   A month after had episode of HR increase to 100s    Lasted about 10 to 12 hours   Felt stressed  No dizziness  Went away    None since   This was 2 wks ago     Now does some walking   Gardens   Has back prolbems     Current Meds  Medication Sig   acetaminophen (TYLENOL) 500 MG tablet Take 1,000 mg by mouth every 8 (eight) hours as needed for mild pain or moderate pain.   ALPRAZolam (XANAX) 0.5 MG tablet 1/2-1 BID prn for anxiety,caution drowsiness.   amLODipine (NORVASC) 5 MG tablet Take 1 tablet by mouth once daily   DHA-Vitamin C-Lutein (EYE HEALTH FORMULA PO) Take 1 tablet by mouth daily.   diazepam (VALIUM) 5 MG tablet Take 5-10 mg by mouth every 6 (six) hours as needed (nerve pain).   diphenhydrAMINE (BENADRYL ALLERGY) 25 MG tablet Take 1 tablet (25 mg total) by mouth every 4 (four) hours. Take with hydrocodone   DULoxetine (CYMBALTA) 60 MG capsule Take 1 capsule (60 mg total) by mouth daily.   hydrochlorothiazide (HYDRODIURIL) 25 MG tablet Take 1 tablet by mouth once daily   Misc. Devices (TOILET SEAT ELEVATOR) MISC Elevated toilet seat if needed   potassium chloride SA (KLOR-CON) 20 MEQ tablet TAKE 1  BY MOUTH ONCE DAILY   pravastatin (PRAVACHOL) 20 MG tablet Take 1 tablet (20 mg total) by mouth daily.   Propylene Glycol (SYSTANE BALANCE) 0.6 % SOLN Place 1 drop into both eyes 2 (two) times daily.     Allergies:   Codeine, Oxycodone-acetaminophen, Sulfamethoxazole-trimethoprim, Celebrex [celecoxib], Hydrocodone-acetaminophen, Losartan, Lyrica [pregabalin], Mobic [meloxicam], and Tramadol   Past Medical History:  Diagnosis Date   Acid reflux    Anxiety    Arthritis    CHF (congestive heart failure) (HCC)    90's swelling body-no breathing problems at that time-no cardiac -in dr s [place]gang [place] office [place] give lasix and potassium   Complication of anesthesia  History of being told she was allergic to something  in 1980 no problems since   Depression    Fibromyalgia    Fibromyalgia 09/10/2019   History of blood transfusion reaction 1978   History of hiatal hernia    HNP (herniated nucleus pulposus)    Hypertension    Does not see a cardiologist no stress or echo   Joint pain    Nerve pain    right leg    Ruptured lumbar disc    Sleep apnea    Uses CPAP every night setting at 7 mm/Hg    Past Surgical History:  Procedure Laterality Date   ablation of nerves in back s1 and lumbar  05/21/2021   [place] nuero Dr Brien Few   BACK SURGERY     x3   BLADDER REPAIR     tear during during birth of child   CARPAL TUNNEL RELEASE Bilateral    CESAREAN SECTION     CHOLECYSTECTOMY     gallbladder disease; stayed sick   COLONOSCOPY N/A 04/28/2016   Procedure: COLONOSCOPY;  Surgeon: [name], MD;  [facility];  Service: Endoscopy;  Laterality: N/A;   ESOPHAGOGASTRODUODENOSCOPY N/A 04/28/2016   Procedure: ESOPHAGOGASTRODUODENOSCOPY (EGD);  Surgeon: [name], MD;  [facility];  Service: Endoscopy;  Laterality: N/A;  12:00   GALLBLADDER SURGERY     KNEE ARTHROSCOPY Right 06/16/2021   Procedure: ARTHROSCOPY KNEE WITH LIMITED DEBRIDEMENT;  Surgeon: [name], MD;  Location: [facility];  Service: Orthopedics;  Laterality: Right;   knee right (torn Cart)     REPLACEMENT TOTAL KNEE     left   RIGHT/LEFT HEART CATH AND CORONARY ANGIOGRAPHY N/A 01/23/2020   Procedure: RIGHT/LEFT HEART CATH AND CORONARY ANGIOGRAPHY;  Surgeon: [name], MD;  Location: [facility];  Service: Cardiovascular;  Laterality: N/A;   ROTATOR CUFF REPAIR Bilateral    right and left   SPINAL FUSION     TOTAL KNEE ARTHROPLASTY Right 12/29/2017   Procedure: TOTAL KNEE ARTHROPLASTY-right knee;  Surgeon: [name], MD;  Location: [facility];  Service: Orthopedics;  Laterality: Right;   TUBAL LIGATION     vein right leg removed     wrist ganglion cyst left       Social  History:  The patient  reports that she has never smoked. She has never used smokeless tobacco. She reports that she does not drink alcohol and does not use drugs.   Family History:  The patient's family history includes Arthritis in an other family member; Asthma in an other family member; Cancer in her father and another family member; Coronary artery disease in an other family member; Coronary artery disease (age of onset: 63) in her brother; Diabetes in her brother and another family member; Heart disease in her brother and mother; Hypertension in her father.    ROS:  Please see the history of present illness. All other systems are reviewed and  Negative to the above problem except as noted.    PHYSICAL EXAM: VS:  BP 120/70 (BP Location: Left Arm, Patient Position: Sitting, Cuff Size: Normal)   Pulse 78   Ht '5\' 2"'$  (1.575 m)   Wt 174 lb (78.9 kg)   SpO2 95%   BMI 31.83 kg/m   GEN: OBese 70 yo in no acute distress  HEENT: normal  Neck: no JVD, carotid bruits, Cardiac: RRR; no murmurs NO LE edema  Respiratory:  clear to auscultation bilaterally  GI: soft, nontender, nondistended, + BS  No hepatomegaly  MS: no deformity Moving all extremities   Skin: warm and dry, no rash Neuro:  Strength and sensation are intact Psych: euthymic mood, full affect   EKG:  EKG is not  ordered today.   Lipid Panel    Component Value Date/Time   CHOL 149 07/11/2020 0818   TRIG 408 (H) 07/11/2020 0818   HDL 32 (L) 07/11/2020 0818   CHOLHDL 4.7 (H) 07/11/2020 0818   CHOLHDL 4.5 08/20/2013 0741   VLDL 59 (H) 08/20/2013 0741   LDLCALC 55 07/11/2020 0818      Wt Readings from Last 3 Encounters:  07/15/21 174 lb (78.9 kg)  06/16/21 173 lb 15.1 oz (78.9 kg)  06/12/21 174 lb (78.9 kg)      ASSESSMENT AND PLAN:  1 Heart rate   Pt with short burst of elevated HR Limited to about 12 hours  None since  Not hemodyanmically destabilizing   HR not too high Follow   No testing for now unless it  recurs becomes a problem Stay hydrated  2  Hx CP   Cath in Feb 2021  tortuous coronary arteries in 2021  No CAD  LVEDP 13  R sided pressures normal    2  HTN   BP is controlled     3  HL  LDL 55  HDL 32   Follow     F/U if recurs otherwise in 12 months  .     Current medicines are reviewed at length with the patient today.  The patient does not have concerns regarding medicines.  Signed, Dorris Carnes, MD  07/15/2021 2:38 PM    [address] [address], [place], [place]  [address] Phone: [phone]; Fax: [phone]

## 2021-07-15 ENCOUNTER — Other Ambulatory Visit: Payer: Self-pay

## 2021-07-15 ENCOUNTER — Ambulatory Visit (INDEPENDENT_AMBULATORY_CARE_PROVIDER_SITE_OTHER): Payer: PPO | Admitting: Orthopedic Surgery

## 2021-07-15 ENCOUNTER — Other Ambulatory Visit (HOSPITAL_COMMUNITY): Payer: Self-pay | Admitting: Family Medicine

## 2021-07-15 ENCOUNTER — Encounter: Payer: Self-pay | Admitting: Internal Medicine

## 2021-07-15 ENCOUNTER — Ambulatory Visit: Payer: PPO | Admitting: Internal Medicine

## 2021-07-15 VITALS — BP 120/70 | HR 78 | Ht 62.0 in | Wt 174.0 lb

## 2021-07-15 DIAGNOSIS — I10 Essential (primary) hypertension: Secondary | ICD-10-CM | POA: Diagnosis not present

## 2021-07-15 DIAGNOSIS — Z96651 Presence of right artificial knee joint: Secondary | ICD-10-CM

## 2021-07-15 DIAGNOSIS — Z1231 Encounter for screening mammogram for malignant neoplasm of breast: Secondary | ICD-10-CM

## 2021-07-15 DIAGNOSIS — Z4889 Encounter for other specified surgical aftercare: Secondary | ICD-10-CM

## 2021-07-15 DIAGNOSIS — M25861 Other specified joint disorders, right knee: Secondary | ICD-10-CM

## 2021-07-15 NOTE — Progress Notes (Signed)
Chief Complaint  Patient presents with   Routine Post Op    DOS 06/16/21//pt states she is still a little sore but overall doing well.   Pod # 29  Encounter Diagnoses  Name Primary?   S/P TKR (total knee replacement), right 12/29/17 arthroscopy for patellar clunk on 06/16/21    Patellar clunk syndrome of right knee    Aftercare following surgery Yes   Emily [name] has improved in terms of her knee the clunk has resolved and she says her knee is returning back to normal  She is having some continued symptoms in her right lower back and hip but this does not present as groin pain or anterior thigh pain  She will see Dr. Assunta Curtis for further management  She is already on Cymbalta  She had a GI ulcer from taking Mobic so that is not an option.

## 2021-07-15 NOTE — Patient Instructions (Signed)
Medication Instructions:  Your physician recommends that you continue on your current medications as directed. Please refer to the Current Medication list given to you today.  *If you need a refill on your cardiac medications before your next appointment, please call your pharmacy*   Lab Work: NONE   If you have labs (blood work) drawn today and your tests are completely normal, you will receive your results only by: Jud (if you have MyChart) OR A paper copy in the mail If you have any lab test that is abnormal or we need to change your treatment, we will call you to review the results.   Testing/Procedures: NONE    Follow-Up: At [facility], you and your health needs are our priority.  As part of our continuing mission to provide you with exceptional heart care, we have created designated Provider Care Teams.  These Care Teams include your primary Cardiologist (physician) and Advanced Practice Providers (APPs -  Physician Assistants and Nurse Practitioners) who all work together to provide you with the care you need, when you need it.  We recommend signing up for the patient portal called "MyChart".  Sign up information is provided on this After Visit Summary.  MyChart is used to connect with patients for Virtual Visits (Telemedicine).  Patients are able to view lab/test results, encounter notes, upcoming appointments, etc.  Non-urgent messages can be sent to your provider as well.   To learn more about what you can do with MyChart, go to [email or web address].    Your next appointment:   1 day(s)  The format for your next appointment:   In Person  Provider:   Dorris Carnes, MD   Other Instructions Thank you for choosing [facility]!

## 2021-07-22 ENCOUNTER — Ambulatory Visit (HOSPITAL_COMMUNITY)
Admission: RE | Admit: 2021-07-22 | Discharge: 2021-07-22 | Disposition: A | Discharge status: Home / Self Care | Payer: PPO | Source: Ambulatory Visit | Attending: Family Medicine | Admitting: Family Medicine

## 2021-07-22 ENCOUNTER — Other Ambulatory Visit: Payer: Self-pay

## 2021-07-22 DIAGNOSIS — M461 Sacroiliitis, not elsewhere classified: Secondary | ICD-10-CM | POA: Diagnosis not present

## 2021-07-22 DIAGNOSIS — M961 Postlaminectomy syndrome, not elsewhere classified: Secondary | ICD-10-CM | POA: Diagnosis not present

## 2021-07-22 DIAGNOSIS — Z1231 Encounter for screening mammogram for malignant neoplasm of breast: Secondary | ICD-10-CM | POA: Insufficient documentation

## 2021-07-23 DIAGNOSIS — Z79891 Long term (current) use of opiate analgesic: Secondary | ICD-10-CM | POA: Diagnosis not present

## 2021-07-23 DIAGNOSIS — M5440 Lumbago with sciatica, unspecified side: Secondary | ICD-10-CM | POA: Diagnosis not present

## 2021-07-23 DIAGNOSIS — G8929 Other chronic pain: Secondary | ICD-10-CM | POA: Diagnosis not present

## 2021-07-23 DIAGNOSIS — E785 Hyperlipidemia, unspecified: Secondary | ICD-10-CM | POA: Diagnosis not present

## 2021-07-23 DIAGNOSIS — I10 Essential (primary) hypertension: Secondary | ICD-10-CM | POA: Diagnosis not present

## 2021-07-24 LAB — COMPREHENSIVE METABOLIC PANEL
ALT: 18 IU/L (ref 0–32)
AST: 19 IU/L (ref 0–40)
Albumin/Globulin Ratio: 1.8 (ref 1.2–2.2)
Albumin: 4.5 g/dL (ref 3.8–4.8)
Alkaline Phosphatase: 77 IU/L (ref 44–121)
BUN/Creatinine Ratio: 17 (ref 12–28)
BUN: 11 mg/dL (ref 8–27)
Bilirubin Total: 0.4 mg/dL (ref 0.0–1.2)
CO2: 23 mmol/L (ref 20–29)
Calcium: 9.6 mg/dL (ref 8.7–10.3)
Chloride: 103 mmol/L (ref 96–106)
Creatinine, Ser: 0.64 mg/dL (ref 0.57–1.00)
Globulin, Total: 2.5 g/dL (ref 1.5–4.5)
Glucose: 110 mg/dL — ABNORMAL HIGH (ref 65–99)
Potassium: 4 mmol/L (ref 3.5–5.2)
Sodium: 143 mmol/L (ref 134–144)
Total Protein: 7 g/dL (ref 6.0–8.5)
eGFR: 96 mL/min/{1.73_m2} (ref >59)

## 2021-07-24 LAB — LIPID PANEL
Chol/HDL Ratio: 3.3 ratio (ref 0.0–4.4)
Cholesterol, Total: 154 mg/dL (ref 100–199)
HDL: 47 mg/dL (ref >39)
LDL Chol Calc (NIH): 84 mg/dL (ref 0–99)
Triglycerides: 129 mg/dL (ref 0–149)
VLDL Cholesterol Cal: 23 mg/dL (ref 5–40)

## 2021-08-18 ENCOUNTER — Other Ambulatory Visit: Payer: Self-pay

## 2021-08-18 ENCOUNTER — Ambulatory Visit
Admission: EM | Admit: 2021-08-18 | Discharge: 2021-08-18 | Disposition: A | Discharge status: Home / Self Care | Payer: PPO | Attending: Internal Medicine | Admitting: Internal Medicine

## 2021-08-18 DIAGNOSIS — B349 Viral infection, unspecified: Secondary | ICD-10-CM

## 2021-08-18 DIAGNOSIS — Z1152 Encounter for screening for COVID-19: Secondary | ICD-10-CM | POA: Diagnosis not present

## 2021-08-18 MED ORDER — BENZONATATE 100 MG PO CAPS: 100.0000 mg | ORAL_CAPSULE | Freq: Three times a day (TID) | ORAL | 0 refills | Status: DC

## 2021-08-18 MED ORDER — GUAIFENESIN ER 600 MG PO TB12: 600.0000 mg | ORAL_TABLET | Freq: Two times a day (BID) | ORAL | 0 refills | Status: AC

## 2021-08-18 MED ORDER — SALINE SPRAY 0.65 % NA SOLN: 1.0000 | NASAL | 0 refills | Status: DC | PRN

## 2021-08-18 MED ORDER — FLUTICASONE PROPIONATE 50 MCG/ACT NA SUSP: 1.0000 | Freq: Every day | NASAL | 0 refills | Status: DC

## 2021-08-18 NOTE — ED Triage Notes (Signed)
Pt presents with cough, nasal congestion and ear pain that began on sunday

## 2021-08-18 NOTE — Discharge Instructions (Addendum)
Increase oral fluid intake Take medications as prescribed We will call you with recommendations if labs are abnormal Return to urgent care if symptoms worsen. 

## 2021-08-18 NOTE — ED Provider Notes (Signed)
[facility]    CSN: [id number] Arrival date & time: 08/18/21  0803      History   Chief Complaint Chief Complaint  Patient presents with   Cough   Otalgia    HPI Emily [name] is a 70 y.o. female comes to the urgent care with a 3-day history of nasal congestion, sinus pressure and headache as well as fever.  Patient's symptoms has persisted.  She denies any shortness of breath or wheezing.  She has a cough that is not productive.  No nausea, vomiting or diarrhea.  She did some yard work on Friday and was exposed to dust and pollen.  No sick contacts.  Patient is vaccinated against COVID-19 virus.  She is taking some over-the-counter allergy medication with no improvement in symptoms.   HPI  Past Medical History:  Diagnosis Date   Acid reflux    Anxiety    Arthritis    CHF (congestive heart failure) (HCC)    90's swelling body-no breathing problems at that time-no cardiac -in dr s [facility] [place] office [name] give lasix and potassium   Complication of anesthesia    History of being told she was allergic to something in 1980 no problems since   Depression    Fibromyalgia    Fibromyalgia 09/10/2019   History of blood transfusion reaction 1978   History of hiatal hernia    HNP (herniated nucleus pulposus)    Hypertension    Does not see a cardiologist no stress or echo   Joint pain    Nerve pain    right leg    Ruptured lumbar disc    Sleep apnea    Uses CPAP every night setting at 7 mm/Hg    Patient Active Problem List   Diagnosis Date Noted   Patellar clunk syndrome of right knee    Inflammation of sacroiliac joint ([place]) 06/04/2021   Encounter for long-term opiate analgesic use 04/22/2021   Sacroiliitis ([place]) 01/26/2021   Depression, major, single episode, mild ([place]) 10/22/2020   Elevated blood-pressure reading, without diagnosis of hypertension 09/25/2020   Exertional dyspnea    Chest pain of uncertain etiology    Fibromyalgia 09/10/2019   S/P TKR  (total knee replacement), right 12/29/17 01/10/2018   Primary osteoarthritis of right knee    GERD without esophagitis 03/28/2017   Obesity (BMI 30-39.9) 03/28/2017   Obesity 10/06/2016   Lumbar foraminal stenosis 06/04/2016   Carpal tunnel syndrome, bilateral 02/07/2014   S/P lumbar spinal fusion 02/07/2014   Obstructive sleep apnea 02/04/2014   Hand arthritis 10/15/2013   HTN (hypertension), benign 08/24/2013   Hypertriglyceridemia 08/24/2013   Iliotibial band tendonitis 02/08/2013   Trigger finger, acquired 02/08/2013   Low back pain 06/22/2011   HNP (herniated nucleus pulposus) 06/22/2011   HIP PAIN, RIGHT 12/15/2010   LUMBOSACRAL SPONDYLOSIS WITHOUT MYELOPATHY 12/15/2010   SPINAL STENOSIS, LUMBAR 12/15/2010   KNEE REPLACEMENT, HX OF 02/26/2010   DERANGEMENT MENISCUS 07/03/2009   KNEE, ARTHRITIS, DEGEN./OSTEO 05/22/2009   KNEE PAIN 05/22/2009   ANSERINE BURSITIS 05/22/2009   TRIGGER FINGER 09/23/2008    Past Surgical History:  Procedure Laterality Date   ablation of nerves in back s1 and lumbar  05/21/2021   [place] nuero Dr Brien Few   BACK SURGERY     x3   BLADDER REPAIR     tear during during birth of child   CARPAL TUNNEL RELEASE Bilateral    [place]     gallbladder  disease; stayed sick   COLONOSCOPY N/A 04/28/2016   Procedure: COLONOSCOPY;  Surgeon: [name], MD;  [facility];  Service: Endoscopy;  Laterality: N/A;   ESOPHAGOGASTRODUODENOSCOPY N/A 04/28/2016   Procedure: ESOPHAGOGASTRODUODENOSCOPY (EGD);  Surgeon: [name], MD;  [facility];  Service: Endoscopy;  Laterality: N/A;  12:00   GALLBLADDER SURGERY     KNEE ARTHROSCOPY Right 06/16/2021   Procedure: ARTHROSCOPY KNEE WITH LIMITED DEBRIDEMENT;  Surgeon: [name], MD;  Location: [facility];  Service: Orthopedics;  Laterality: Right;   knee right (torn Cart)     REPLACEMENT TOTAL KNEE     left   RIGHT/LEFT HEART CATH AND CORONARY  ANGIOGRAPHY N/A 01/23/2020   Procedure: RIGHT/LEFT HEART CATH AND CORONARY ANGIOGRAPHY;  Surgeon: [name], MD;  Location: [facility];  Service: Cardiovascular;  Laterality: N/A;   ROTATOR CUFF REPAIR Bilateral    right and left   SPINAL FUSION     TOTAL KNEE ARTHROPLASTY Right 12/29/2017   Procedure: TOTAL KNEE ARTHROPLASTY-right knee;  Surgeon: [name], MD;  Location: [facility];  Service: Orthopedics;  Laterality: Right;   TUBAL LIGATION     vein right leg removed     wrist ganglion cyst left      OB History   No obstetric history on file.      Home Medications    Prior to Admission medications   Medication Sig Start Date End Date Taking? Authorizing Provider  benzonatate (TESSALON) 100 MG capsule Take 1 capsule (100 mg total) by mouth every 8 (eight) hours. 08/18/21  Yes Ajanee Buren, Myrene Galas, MD  fluticasone (FLONASE) 50 MCG/ACT nasal spray Place 1 spray into both nostrils daily. 08/18/21  Yes Gianni Mihalik, Myrene Galas, MD  guaiFENesin (MUCINEX) 600 MG 12 hr tablet Take 1 tablet (600 mg total) by mouth 2 (two) times daily for 14 days. 08/18/21 09/01/21 Yes Raneen Jaffer, Myrene Galas, MD  sodium chloride (OCEAN) 0.65 % SOLN nasal spray Place 1 spray into both nostrils as needed for congestion. 08/18/21  Yes Regino Fournet, Myrene Galas, MD  acetaminophen (TYLENOL) 500 MG tablet Take 1,000 mg by mouth every 8 (eight) hours as needed for mild pain or moderate pain.    [provider]  ALPRAZolam Duanne Moron) 0.5 MG tablet 1/2-1 BID prn for anxiety,caution drowsiness. 10/22/20   Kathyrn Drown, MD  amLODipine (NORVASC) 5 MG tablet Take 1 tablet by mouth once daily 06/12/21   Kathyrn Drown, MD  DHA-Vitamin C-Lutein (EYE HEALTH FORMULA PO) Take 1 tablet by mouth daily.    [provider]  diazepam (VALIUM) 5 MG tablet Take 5-10 mg by mouth every 6 (six) hours as needed (nerve pain).    [provider]  diphenhydrAMINE (BENADRYL ALLERGY) 25 MG tablet Take 1 tablet (25 mg total)  by mouth every 4 (four) hours. Take with hydrocodone 12/31/17   [name], MD  DULoxetine (CYMBALTA) 60 MG capsule Take 1 capsule (60 mg total) by mouth daily. 02/03/21   Kathyrn Drown, MD  hydrochlorothiazide (HYDRODIURIL) 25 MG tablet Take 1 tablet by mouth once daily 02/03/21   Kathyrn Drown, MD  Misc. Devices (TOILET SEAT ELEVATOR) MISC Elevated toilet seat if needed 12/31/17   [name], MD  potassium chloride SA (KLOR-CON) 20 MEQ tablet TAKE 1  BY MOUTH ONCE DAILY 02/03/21   Kathyrn Drown, MD  pravastatin (PRAVACHOL) 20 MG tablet Take 1 tablet (20 mg total) by mouth daily. 10/22/20  Kathyrn Drown, MD  Propylene Glycol (SYSTANE BALANCE) 0.6 % SOLN Place 1 drop into both eyes 2 (two) times daily.    [provider]    Family History Family History  Problem Relation Age of Onset   Hypertension Father    Cancer Father        liposarcoma   Heart disease Mother    Cancer Other        family history    Diabetes Other        family history    Coronary artery disease Other        family history    Arthritis Other        family history    Asthma Other    Diabetes Brother    Heart disease Brother    Coronary artery disease Brother 68       CABD    Social History Social History   Tobacco Use   Smoking status: Never   Smokeless tobacco: Never  Vaping Use   Vaping Use: Never used  Substance Use Topics   Alcohol use: No   Drug use: No     Allergies   Codeine, Oxycodone-acetaminophen, Sulfamethoxazole-trimethoprim, Celebrex [celecoxib], Hydrocodone-acetaminophen, Losartan, Lyrica [pregabalin], Mobic [meloxicam], and Tramadol   Review of Systems Review of Systems  Constitutional:  Positive for fever. Negative for chills.  HENT:  Positive for congestion, postnasal drip, rhinorrhea and sore throat. Negative for voice change.   Respiratory:  Positive for cough. Negative for chest tightness, shortness of breath and wheezing.   Gastrointestinal:  Negative.   Genitourinary: Negative.     Physical Exam Triage Vital Signs ED Triage Vitals [08/18/21 0846]  Enc Vitals Group     BP 107/63     Pulse Rate 83     Resp 20     Temp (!) 100.6 F (38.1 C)     Temp src      SpO2 96 %     Weight      Height      Head Circumference      Peak Flow      Pain Score 8     Pain Loc      Pain Edu?      Excl. in [place]?    No data found.  Updated Vital Signs BP 107/63   Pulse 83   Temp (!) 100.6 F (38.1 C)   Resp 20   SpO2 96%   Visual Acuity Right Eye Distance:   Left Eye Distance:   Bilateral Distance:    Right Eye Near:   Left Eye Near:    Bilateral Near:     Physical Exam Vitals and nursing note reviewed.  Constitutional:      General: She is not in acute distress.    Appearance: She is not ill-appearing.  HENT:     Right Ear: Tympanic membrane normal.     Left Ear: Tympanic membrane normal.     Mouth/Throat:     Pharynx: No posterior oropharyngeal erythema.  Cardiovascular:     Rate and Rhythm: Normal rate and regular rhythm.     Pulses: Normal pulses.     Heart sounds: Normal heart sounds.  Pulmonary:     Effort: Pulmonary effort is normal.     Breath sounds: Normal breath sounds.  Abdominal:     General: Bowel sounds are normal.     Palpations: Abdomen is soft.  Neurological:     Mental Status: She is alert.  UC Treatments / Results  Labs (all labs ordered are listed, but only abnormal results are displayed) Labs Reviewed  COVID-19, FLU A+B NAA    EKG   Radiology No results found.  Procedures Procedures (including critical care time)  Medications Ordered in UC Medications - No data to display  Initial Impression / Assessment and Plan / UC Course  I have reviewed the triage vital signs and the nursing notes.  Pertinent labs & imaging results that were available during my care of the patient were reviewed by me and considered in my medical decision making (see chart for details).      1.  Acute viral respiratory illness: COVID-19 PCR test has been sent Fluticasone nasal spray Saline nasal spray Mucinex as needed for thick mucus Tessalon Perles as needed for cough We will call patient with recommendations if labs are abnormal Return precautions given. Final Clinical Impressions(s) / UC Diagnoses   Final diagnoses:  Viral illness     Discharge Instructions      Increase oral fluid intake Take medications as prescribed We will call you with recommendations if labs are abnormal Return to urgent care if symptoms worsen.   ED Prescriptions     Medication Sig Dispense Auth. Provider   fluticasone (FLONASE) 50 MCG/ACT nasal spray Place 1 spray into both nostrils daily. 16 g Nari Vannatter, Myrene Galas, MD   sodium chloride (OCEAN) 0.65 % SOLN nasal spray Place 1 spray into both nostrils as needed for congestion. 104 mL Artin Mceuen, Myrene Galas, MD   guaiFENesin (MUCINEX) 600 MG 12 hr tablet Take 1 tablet (600 mg total) by mouth 2 (two) times daily for 14 days. 28 tablet Kinaya Hilliker, Myrene Galas, MD   benzonatate (TESSALON) 100 MG capsule Take 1 capsule (100 mg total) by mouth every 8 (eight) hours. 21 capsule Chukwuemeka Artola, Myrene Galas, MD      PDMP not reviewed this encounter.   Chase Picket, MD 08/18/21 [phone]

## 2021-08-19 ENCOUNTER — Other Ambulatory Visit: Payer: Self-pay

## 2021-08-19 ENCOUNTER — Ambulatory Visit (INDEPENDENT_AMBULATORY_CARE_PROVIDER_SITE_OTHER): Payer: PPO | Admitting: Family Medicine

## 2021-08-19 ENCOUNTER — Encounter: Payer: Self-pay | Admitting: Family Medicine

## 2021-08-19 VITALS — HR 84 | Temp 98.1°F | Ht 62.0 in | Wt 166.0 lb

## 2021-08-19 DIAGNOSIS — B349 Viral infection, unspecified: Secondary | ICD-10-CM

## 2021-08-19 LAB — COVID-19, FLU A+B NAA
Influenza A, NAA: NOT DETECTED
Influenza B, NAA: NOT DETECTED
SARS-CoV-2, NAA: DETECTED — AB

## 2021-08-19 NOTE — Progress Notes (Signed)
   Subjective:    Patient ID: Emily [name], female    DOB: [birth year], 70 y.o.   MRN: [id number]  Cough Associated symptoms include headaches.  Headache  Associated symptoms include coughing.   R ear pain, fevers, sore throat from cough x 3 days Taking mucinex, tessalon pearls, allegra, tylenol, flonase  Review of Systems  Respiratory:  Positive for cough.   Neurological:  Positive for headaches.  Urgent care yesterday waiting covid test results     Objective:   Physical Exam Eardrums are normal mild frontal sinus tenderness to percussion throat is normal lungs clear heart regular       Assessment & Plan:   Viral syndrome COVID test pending Symptomatic care discussed Hold off on antibiotics currently Warning signs discussed When COVID test comes back may need antiviral medicine depending on the result of the test follow-up sooner if any problems

## 2021-08-20 ENCOUNTER — Telehealth: Payer: Self-pay | Admitting: Family Medicine

## 2021-08-20 ENCOUNTER — Other Ambulatory Visit: Payer: Self-pay | Admitting: Family Medicine

## 2021-08-20 MED ORDER — NIRMATRELVIR/RITONAVIR (PAXLOVID)TABLET: 3.0000 | ORAL_TABLET | Freq: Two times a day (BID) | ORAL | 0 refills | Status: AC

## 2021-08-20 MED ORDER — NIRMATRELVIR/RITONAVIR (PAXLOVID)TABLET: 3.0000 | ORAL_TABLET | Freq: Two times a day (BID) | ORAL | 0 refills | Status: DC

## 2021-08-20 NOTE — Telephone Encounter (Signed)
Patient 's Covid test came back positive yesterday. She states has fever,headache,congestion .cough ,diarrhea. Please advise

## 2021-08-20 NOTE — Telephone Encounter (Signed)
Nurses Neck step is consideration of Paxlovid If patient is interested in this typically [facility] or [facility] can get this medication please see you which 1 she would like to do

## 2021-08-20 NOTE — Telephone Encounter (Signed)
Pt contacted and states she would like to use [facility]. Please advise. Thank you

## 2021-08-20 NOTE — Telephone Encounter (Signed)
[facility] contacted and script cancelled

## 2021-08-20 NOTE — Telephone Encounter (Signed)
Medication was sent to [facility]  It was also accidentally sent to [facility] please cancel at [facility] the patient to get the medicine at [facility], no pravastatin for 5 days, reduce amlodipine to half dose for 5 days

## 2021-08-22 ENCOUNTER — Other Ambulatory Visit: Payer: Self-pay | Admitting: Family Medicine

## 2021-08-31 ENCOUNTER — Other Ambulatory Visit: Payer: Self-pay | Admitting: Family Medicine

## 2021-09-16 ENCOUNTER — Other Ambulatory Visit: Payer: Self-pay

## 2021-09-16 ENCOUNTER — Encounter: Payer: Self-pay | Admitting: Family Medicine

## 2021-09-16 ENCOUNTER — Ambulatory Visit (INDEPENDENT_AMBULATORY_CARE_PROVIDER_SITE_OTHER): Payer: PPO | Admitting: Family Medicine

## 2021-09-16 VITALS — BP 107/71 | HR 74 | Temp 97.0°F | Ht 62.0 in | Wt 169.0 lb

## 2021-09-16 DIAGNOSIS — K2900 Acute gastritis without bleeding: Secondary | ICD-10-CM

## 2021-09-16 DIAGNOSIS — Z79891 Long term (current) use of opiate analgesic: Secondary | ICD-10-CM | POA: Diagnosis not present

## 2021-09-16 DIAGNOSIS — I10 Essential (primary) hypertension: Secondary | ICD-10-CM

## 2021-09-16 DIAGNOSIS — E785 Hyperlipidemia, unspecified: Secondary | ICD-10-CM

## 2021-09-16 MED ORDER — AMLODIPINE BESYLATE 5 MG PO TABS: 5.0000 mg | ORAL_TABLET | Freq: Every day | ORAL | 1 refills | Status: DC

## 2021-09-16 MED ORDER — HYDROCODONE-ACETAMINOPHEN 5-325 MG PO TABS: ORAL_TABLET | ORAL | 0 refills | Status: DC

## 2021-09-16 MED ORDER — DULOXETINE HCL 60 MG PO CPEP: 60.0000 mg | ORAL_CAPSULE | Freq: Every day | ORAL | 1 refills | Status: DC

## 2021-09-16 MED ORDER — HYDROCHLOROTHIAZIDE 25 MG PO TABS: 25.0000 mg | ORAL_TABLET | Freq: Every day | ORAL | 1 refills | Status: DC

## 2021-09-16 MED ORDER — POTASSIUM CHLORIDE CRYS ER 20 MEQ PO TBCR: 20.0000 meq | EXTENDED_RELEASE_TABLET | Freq: Every day | ORAL | 1 refills | Status: DC

## 2021-09-16 MED ORDER — PANTOPRAZOLE SODIUM 40 MG PO TBEC: 40.0000 mg | DELAYED_RELEASE_TABLET | Freq: Every day | ORAL | 0 refills | Status: DC

## 2021-09-16 MED ORDER — PRAVASTATIN SODIUM 20 MG PO TABS: 20.0000 mg | ORAL_TABLET | Freq: Every day | ORAL | 1 refills | Status: DC

## 2021-09-16 NOTE — Progress Notes (Signed)
Subjective:    Patient ID: Emily [name], female    DOB: May 21, 1951, 70 y.o.   MRN: [id number]  HPI This patient was seen today for chronic pain  The medication list was reviewed and updated.  Location of Pain for which the patient has been treated with regarding narcotics: low back pain , right hip   Onset of this pain: chronic    -Compliance with medication: daily 2 tabs   - Number patient states they take daily: 2  -when was the last dose patient took? Yesterday am   The patient was advised the importance of maintaining medication and not using illegal substances with these.  Here for refills and follow up  The patient was educated that we can provide 3 monthly scripts for their medication, it is their responsibility to follow the instructions.  Side effects or complications from medications: itching   Patient is aware that pain medications are meant to minimize the severity of the pain to allow their pain levels to improve to allow for better function. They are aware of that pain medications cannot totally remove their pain.  Due for UDT ( at least once per year) : 10/22/20  Scale of 1 to 10 ( 1 is least 10 is most) Your pain level without the medicine: 8/10 Your pain level with medication 4/10  Scale 1 to 10 ( 1-helps very little, 10 helps very well) How well does your pain medication reduce your pain so you can function better through out the day? 10  Quality of the pain: dull, achy, sharp  Persistence of the pain: all day on and off  Modifying factors: walking, exercise, heat/ ice  Intermittent epigastric pain ever since taking Paxlovid She states it is not severe but it causes some intermittent discomfort some nausea we will try PPI to see if this helps, lab work from August look good patient relates the pain is not severe      Review of Systems     Objective:   Physical Exam General-in no acute distress Eyes-no discharge Lungs-respiratory rate  normal, CTA CV-no murmurs,RRR Extremities skin warm dry no edema Neuro grossly normal Behavior normal, alert        Assessment & Plan:  1. HTN (hypertension), benign Blood pressure good control continue current measures watch diet if blood pressure drops may need to reduce medication  2. Acute gastritis without hemorrhage, unspecified gastritis type She states ever since being on Paxlovid she has had intermittent epigastric pain and discomfort we will try PPI over the next month if that totally clears her up no need to do further tests if it does not she would need the EGD  3. Encounter for long-term opiate analgesic use The patient was seen in followup for chronic pain. A review over at their current pain status was discussed. Drug registry was checked. Prescriptions were given.  Regular follow-up recommended. Discussion was held regarding the importance of compliance with medication as well as pain medication contract.  Patient was informed that medication may cause drowsiness and should not be combined  with other medications/alcohol or street drugs. If the patient feels medication is causing altered alertness then do not drive or operate dangerous equipment.  She would like to reduce her pain medication therefore after she finishes utilizing the 10 mg she will drop it to the 5 mg no more than 2/day currently she is breaking the 10 mg in half she will follow-up in 4 months she should have plenty  of medicine to last her till then  She does have musculoskeletal pain in the right lower portion of her back that the injections that Dr. Brien Few was doing is not helping there is a possibility that we may be sending her to a different pain management center for further evaluation she will let us know  4. Hyperlipidemia, unspecified hyperlipidemia type Continue medication watch diet

## 2021-09-16 NOTE — Patient Instructions (Signed)

## 2021-10-03 ENCOUNTER — Other Ambulatory Visit: Payer: Self-pay | Admitting: Family Medicine

## 2021-10-23 ENCOUNTER — Other Ambulatory Visit: Payer: Self-pay | Admitting: Family Medicine

## 2021-11-25 DIAGNOSIS — G4733 Obstructive sleep apnea (adult) (pediatric): Secondary | ICD-10-CM | POA: Diagnosis not present

## 2021-12-03 ENCOUNTER — Ambulatory Visit: Payer: PPO | Admitting: Orthopedic Surgery

## 2021-12-03 ENCOUNTER — Other Ambulatory Visit: Payer: Self-pay

## 2021-12-03 ENCOUNTER — Encounter: Payer: Self-pay | Admitting: Orthopedic Surgery

## 2021-12-03 VITALS — BP 149/81 | HR 73 | Ht 62.0 in | Wt 169.0 lb

## 2021-12-03 DIAGNOSIS — M65332 Trigger finger, left middle finger: Secondary | ICD-10-CM

## 2021-12-03 NOTE — Progress Notes (Signed)
Chief Complaint  Patient presents with   Hand Pain    LT Pain worsens when pt overuses hand Index finger and long finger hurts worse than others at times    HPI: 70 YO PAIN palm left hand index and long finger occasional locking of the left long    Past Medical History:  Diagnosis Date   Acid reflux    Anxiety    Arthritis    CHF (congestive heart failure) (HCC)    90's swelling body-no breathing problems at that time-no cardiac -in dr s [facility] [place] office [name] give lasix and potassium   Complication of anesthesia    History of being told she was allergic to something in 1980 no problems since   Depression    Fibromyalgia    Fibromyalgia 09/10/2019   History of blood transfusion reaction 1978   History of hiatal hernia    HNP (herniated nucleus pulposus)    Hypertension    Does not see a cardiologist no stress or echo   Joint pain    Nerve pain    right leg    Ruptured lumbar disc    Sleep apnea    Uses CPAP every night setting at 7 mm/Hg    BP (!) 149/81    Pulse 73    Ht 5\' 2"  (1.575 m)    Wt 169 lb (76.7 kg)    BMI 30.91 kg/m    General appearance: Well-developed well-nourished no gross deformities  Cardiovascular normal pulse and perfusion normal color without edema  Neurologically no sensation loss or deficits or pathologic reflexes  Psychological: Awake alert and oriented x3 mood and affect normal  Skin no lacerations or ulcerations no nodularity no palpable masses, no erythema or nodularity  Musculoskeletal: She has pain and tenderness over the left long finger some over the left index finger but normal flexion extension with some pain  Imaging no imaging  A/P  Diagnosed with a trigger finger  Encounter Diagnosis  Name Primary?   Trigger middle finger of left hand Yes    Recommend injection  If no improvement follow-up for second injection  Trigger finger injection  Diagnosis   Encounter Diagnosis  Name Primary?   Trigger middle finger of  left hand Yes    Procedure injection A1 pulley Medications lidocaine 1% 1 mL and Depo-Medrol 40 mg 1 mL Skin prep alcohol and ethyl chloride Verbal consent was obtained Timeout confirmed the injection site  After cleaning the skin with alcohol and anesthetizing the skin with ethyl chloride the A1 pulley was palpated and the injection was performed without complication

## 2021-12-09 ENCOUNTER — Telehealth: Payer: Self-pay | Admitting: Family Medicine

## 2021-12-09 DIAGNOSIS — M5440 Lumbago with sciatica, unspecified side: Secondary | ICD-10-CM

## 2021-12-09 DIAGNOSIS — G8929 Other chronic pain: Secondary | ICD-10-CM

## 2021-12-09 NOTE — Telephone Encounter (Signed)
Patient is requesting a referral to pain management in [place].

## 2021-12-09 NOTE — Telephone Encounter (Signed)
May have referral May be best to clarify with her what type of pain management Some do primarily medications Some do injections and that type of stuff

## 2021-12-10 NOTE — Telephone Encounter (Signed)
Left message to return call 

## 2021-12-11 NOTE — Telephone Encounter (Signed)
Referral ordered in Epic. Patient aware.

## 2021-12-11 NOTE — Addendum Note (Signed)
Addended by: Dairl Ponder on: 12/11/2021 02:40 PM   Modules accepted: Orders

## 2021-12-11 NOTE — Telephone Encounter (Signed)
Patient stated she was seeing a doctor in the past who did injections and they were no helping so she guesses it would be medication management at this point

## 2021-12-11 NOTE — Telephone Encounter (Signed)
[facility] or [facility] or [facility] in [place] All of those do med management

## 2021-12-19 ENCOUNTER — Ambulatory Visit
Admission: EM | Admit: 2021-12-19 | Discharge: 2021-12-19 | Disposition: A | Discharge status: Home / Self Care | Payer: PPO | Attending: Family Medicine | Admitting: Family Medicine

## 2021-12-19 ENCOUNTER — Encounter: Payer: Self-pay | Admitting: Emergency Medicine

## 2021-12-19 DIAGNOSIS — H65191 Other acute nonsuppurative otitis media, right ear: Secondary | ICD-10-CM | POA: Diagnosis not present

## 2021-12-19 DIAGNOSIS — Z1152 Encounter for screening for COVID-19: Secondary | ICD-10-CM

## 2021-12-19 DIAGNOSIS — J029 Acute pharyngitis, unspecified: Secondary | ICD-10-CM

## 2021-12-19 DIAGNOSIS — J069 Acute upper respiratory infection, unspecified: Secondary | ICD-10-CM | POA: Diagnosis not present

## 2021-12-19 MED ORDER — LIDOCAINE VISCOUS HCL 2 % MT SOLN: 10.0000 mL | OROMUCOSAL | 0 refills | Status: DC | PRN

## 2021-12-19 MED ORDER — DEXAMETHASONE SODIUM PHOSPHATE 10 MG/ML IJ SOLN
10.0000 mg | Freq: Once | INTRAMUSCULAR | Status: AC
  Administered 2021-12-19: 10 mg via INTRAMUSCULAR

## 2021-12-19 MED ORDER — PROMETHAZINE-DM 6.25-15 MG/5ML PO SYRP: 5.0000 mL | ORAL_SOLUTION | Freq: Four times a day (QID) | ORAL | 0 refills | Status: DC | PRN

## 2021-12-19 NOTE — ED Provider Notes (Signed)
[facility]    CSN: [id number] Arrival date & time: 12/19/21  0817      History   Chief Complaint Chief Complaint  Patient presents with   Cough    HPI Emily [name] is a 71 y.o. female.   Patient presenting today with 5 to 6-day history of cough, sinus pressure, sharp right ear pain, body aches, low-grade fever, sore throat, fatigue.  States the ear pain and sore throat are the most bothersome to her at this time.  She has been using salt water gargles, Listerine, over-the-counter fever reducers, Mucinex with only mild temporary relief.  Denies chest pain, shortness of breath, abdominal pain, dizziness.  No known sick contacts recently.     Past Medical History:  Diagnosis Date   Acid reflux    Anxiety    Arthritis    CHF (congestive heart failure) (HCC)    90's swelling body-no breathing problems at that time-no cardiac -in dr s Wolfgang [place] office [name] give lasix and potassium   Complication of anesthesia    History of being told she was allergic to something in 1980 no problems since   Depression    Fibromyalgia    Fibromyalgia 09/10/2019   History of blood transfusion reaction 1978   History of hiatal hernia    HNP (herniated nucleus pulposus)    Hypertension    Does not see a cardiologist no stress or echo   Joint pain    Nerve pain    right leg    Ruptured lumbar disc    Sleep apnea    Uses CPAP every night setting at 7 mm/Hg    Patient Active Problem List   Diagnosis Date Noted   Patellar clunk syndrome of right knee    Inflammation of sacroiliac joint ([place]) 06/04/2021   Encounter for long-term opiate analgesic use 04/22/2021   Sacroiliitis ([place]) 01/26/2021   Depression, major, single episode, mild ([place]) 10/22/2020   Elevated blood-pressure reading, without diagnosis of hypertension 09/25/2020   Exertional dyspnea    Chest pain of uncertain etiology    Fibromyalgia 09/10/2019   S/P TKR (total knee replacement), right 12/29/17 01/10/2018    Primary osteoarthritis of right knee    GERD without esophagitis 03/28/2017   Obesity (BMI 30-39.9) 03/28/2017   Obesity 10/06/2016   Lumbar foraminal stenosis 06/04/2016   Carpal tunnel syndrome, bilateral 02/07/2014   S/P lumbar spinal fusion 02/07/2014   Obstructive sleep apnea 02/04/2014   Hand arthritis 10/15/2013   HTN (hypertension), benign 08/24/2013   Hypertriglyceridemia 08/24/2013   Iliotibial band tendonitis 02/08/2013   Trigger finger, acquired 02/08/2013   Low back pain 06/22/2011   HNP (herniated nucleus pulposus) 06/22/2011   HIP PAIN, RIGHT 12/15/2010   LUMBOSACRAL SPONDYLOSIS WITHOUT MYELOPATHY 12/15/2010   SPINAL STENOSIS, LUMBAR 12/15/2010   KNEE REPLACEMENT, HX OF 02/26/2010   DERANGEMENT MENISCUS 07/03/2009   KNEE, ARTHRITIS, DEGEN./OSTEO 05/22/2009   KNEE PAIN 05/22/2009   ANSERINE BURSITIS 05/22/2009   TRIGGER FINGER 09/23/2008    Past Surgical History:  Procedure Laterality Date   ablation of nerves in back s1 and lumbar  05/21/2021   [place] [facility] Dr Brien Few   BACK SURGERY     x3   BLADDER REPAIR     tear during during birth of child   CARPAL TUNNEL RELEASE Bilateral    CESAREAN SECTION     CHOLECYSTECTOMY     gallbladder disease; stayed sick   COLONOSCOPY N/A 04/28/2016   Procedure: COLONOSCOPY;  Surgeon: Bernadene Person  Gloriann Loan, MD;  [facility]: [facility];  Service: Endoscopy;  Laterality: N/A;   ESOPHAGOGASTRODUODENOSCOPY N/A 04/28/2016   Procedure: ESOPHAGOGASTRODUODENOSCOPY (EGD);  Surgeon: [name], MD;  [facility]: [facility];  Service: Endoscopy;  Laterality: N/A;  12:00   GALLBLADDER SURGERY     KNEE ARTHROSCOPY Right 06/16/2021   Procedure: ARTHROSCOPY KNEE WITH LIMITED DEBRIDEMENT;  Surgeon: [name], MD;  Location: [facility];  Service: Orthopedics;  Laterality: Right;   knee right (torn Cart)     REPLACEMENT TOTAL KNEE     left   RIGHT/LEFT HEART CATH AND CORONARY ANGIOGRAPHY N/A 01/23/2020   Procedure: RIGHT/LEFT  HEART CATH AND CORONARY ANGIOGRAPHY;  Surgeon: [name], MD;  Location: [facility];  Service: Cardiovascular;  Laterality: N/A;   ROTATOR CUFF REPAIR Bilateral    right and left   SPINAL FUSION     TOTAL KNEE ARTHROPLASTY Right 12/29/2017   Procedure: TOTAL KNEE ARTHROPLASTY-right knee;  Surgeon: [name], MD;  Location: [facility];  Service: Orthopedics;  Laterality: Right;   TUBAL LIGATION     vein right leg removed     wrist ganglion cyst left      OB History   No obstetric history on file.      Home Medications    Prior to Admission medications   Medication Sig Start Date End Date Taking? Authorizing Provider  acetaminophen (TYLENOL) 500 MG tablet Take 1,000 mg by mouth every 8 (eight) hours as needed for mild pain or moderate pain.   Yes [provider]  amLODipine (NORVASC) 5 MG tablet Take 1 tablet (5 mg total) by mouth daily. 09/16/21  Yes Kathyrn Drown, MD  DHA-Vitamin C-Lutein (EYE HEALTH FORMULA PO) Take 1 tablet by mouth daily.   Yes [provider]  DULoxetine (CYMBALTA) 60 MG capsule Take 1 capsule by mouth once daily 10/23/21  Yes Luking, Scott A, MD  fluticasone (FLONASE) 50 MCG/ACT nasal spray Place 1 spray into both nostrils daily. 08/18/21  Yes Lamptey, Myrene Galas, MD  hydrochlorothiazide (HYDRODIURIL) 25 MG tablet Take 1 tablet (25 mg total) by mouth daily. 09/16/21  Yes Luking, Elayne Snare, MD  HYDROcodone-acetaminophen (NORCO/VICODIN) 5-325 MG tablet TAKE ONE TAB BY MOUTH TWICE DAILY AS NEEDED 09/16/21  Yes Luking, Scott A, MD  lidocaine (XYLOCAINE) 2 % solution Use as directed 10 mLs in the mouth or throat every 3 (three) hours as needed for mouth pain. 12/19/21  Yes Onward, [place]. Devices (TOILET SEAT ELEVATOR) MISC Elevated toilet seat if needed 12/31/17  Yes [name], MD  potassium chloride SA (KLOR-CON) 20 MEQ tablet Take 1 tablet (20 mEq total) by mouth daily. 09/16/21  Yes Kathyrn Drown, MD   pravastatin (PRAVACHOL) 20 MG tablet Take 1 tablet (20 mg total) by mouth daily. 09/16/21  Yes Kathyrn Drown, MD  promethazine-dextromethorphan (PROMETHAZINE-DM) 6.25-15 MG/5ML syrup Take 5 mLs by mouth 4 (four) times daily as needed. 12/19/21  Yes Volney American, PA-C  Propylene Glycol (SYSTANE BALANCE) 0.6 % SOLN Place 1 drop into both eyes 2 (two) times daily.   Yes [provider]  diazepam (VALIUM) 5 MG tablet Take 5-10 mg by mouth every 6 (six) hours as needed (nerve pain).    [provider]  diphenhydrAMINE (BENADRYL ALLERGY) 25 MG tablet Take 1 tablet (25 mg total) by mouth every 4 (four) hours. Take with hydrocodone 12/31/17   [name], MD  HYDROcodone-acetaminophen (NORCO/VICODIN)  5-325 MG tablet TAKE ONE TAB BY MOUTH TWICE DAILY AS NEEDED 09/16/21   Kathyrn Drown, MD  pantoprazole (PROTONIX) 40 MG tablet Take 1 tablet (40 mg total) by mouth daily. 09/16/21   Kathyrn Drown, MD    Family History Family History  Problem Relation Age of Onset   Hypertension Father    Cancer Father        liposarcoma   Heart disease Mother    Cancer Other        family history    Diabetes Other        family history    Coronary artery disease Other        family history    Arthritis Other        family history    Asthma Other    Diabetes Brother    Heart disease Brother    Coronary artery disease Brother 43       CABD    Social History Social History   Tobacco Use   Smoking status: Never   Smokeless tobacco: Never  Vaping Use   Vaping Use: Never used  Substance Use Topics   Alcohol use: No   Drug use: No     Allergies   Codeine, Oxycodone-acetaminophen, Sulfamethoxazole-trimethoprim, Celebrex [celecoxib], Hydrocodone-acetaminophen, Losartan, Lyrica [pregabalin], Mobic [meloxicam], and Tramadol   Review of Systems Review of Systems Per HPI  Physical Exam Triage Vital Signs ED Triage Vitals [12/19/21 0844]  Enc Vitals Group      BP 134/81     Pulse Rate 77     Resp 18     Temp 98.8 F (37.1 C)     Temp Source Oral     SpO2 97 %     Weight 165 lb (74.8 kg)     Height 5\' 2"  (1.575 m)     Head Circumference      Peak Flow      Pain Score 4     Pain Loc      Pain Edu?      Excl. in [place]?    No data found.  Updated Vital Signs BP 134/81 (BP Location: Right Arm)    Pulse 77    Temp 98.8 F (37.1 C) (Oral)    Resp 18    Ht 5\' 2"  (1.575 m)    Wt 165 lb (74.8 kg)    SpO2 97%    BMI 30.18 kg/m   Visual Acuity Right Eye Distance:   Left Eye Distance:   Bilateral Distance:    Right Eye Near:   Left Eye Near:    Bilateral Near:     Physical Exam Vitals and nursing note reviewed.  Constitutional:      Appearance: Normal appearance. She is not ill-appearing.  HENT:     Head: Atraumatic.     Right Ear: Tympanic membrane and external ear normal.     Left Ear: Tympanic membrane and external ear normal.     Nose: Rhinorrhea present.     Mouth/Throat:     Mouth: Mucous membranes are moist.     Pharynx: Posterior oropharyngeal erythema present. No oropharyngeal exudate.  Eyes:     Extraocular Movements: Extraocular movements intact.     Conjunctiva/sclera: Conjunctivae normal.  Cardiovascular:     Rate and Rhythm: Normal rate and regular rhythm.     Heart sounds: Normal heart sounds.  Pulmonary:     Effort: Pulmonary effort is normal.     Breath sounds: Normal  breath sounds. No wheezing.  Abdominal:     General: Bowel sounds are normal. There is no distension.     Palpations: Abdomen is soft.     Tenderness: There is no abdominal tenderness. There is no guarding.  Musculoskeletal:        General: Normal range of motion.     Cervical back: Normal range of motion and neck supple.  Lymphadenopathy:     Cervical: No cervical adenopathy.  Skin:    General: Skin is warm and dry.  Neurological:     Mental Status: She is alert and oriented to person, place, and time.     Motor: No weakness.     Gait: Gait  normal.  Psychiatric:        Mood and Affect: Mood normal.        Thought Content: Thought content normal.        Judgment: Judgment normal.     UC Treatments / Results  Labs (all labs ordered are listed, but only abnormal results are displayed) Labs Reviewed  COVID-19, FLU A+B NAA    EKG   Radiology No results found.  Procedures Procedures (including critical care time)  Medications Ordered in UC Medications  dexamethasone (DECADRON) injection 10 mg (10 mg Intramuscular Given 12/19/21 0921)    Initial Impression / Assessment and Plan / UC Course  I have reviewed the triage vital signs and the nursing notes.  Pertinent labs & imaging results that were available during my care of the patient were reviewed by me and considered in my medical decision making (see chart for details).     Vital signs benign and reassuring today, suspect viral illness possibly COVID causing symptoms.  Patient is past the window for antiviral therapy so we will treat symptomatically with viscous lidocaine, Phenergan DM and give IM Decadron for lingering inflammation of eustachian tubes and pharynx.  Discussed supportive home care, return precautions.  Final Clinical Impressions(s) / UC Diagnoses   Final diagnoses:  Encounter for screening for COVID-19  Viral URI with cough  Acute MEE (middle ear effusion), right  Acute pharyngitis, unspecified etiology   Discharge Instructions   None    ED Prescriptions     Medication Sig Dispense Auth. Provider   promethazine-dextromethorphan (PROMETHAZINE-DM) 6.25-15 MG/5ML syrup Take 5 mLs by mouth 4 (four) times daily as needed. 100 mL Volney American, PA-C   lidocaine (XYLOCAINE) 2 % solution Use as directed 10 mLs in the mouth or throat every 3 (three) hours as needed for mouth pain. 100 mL Volney American, [place]      PDMP not reviewed this encounter.   Merrie Roof [place], [place] 12/19/21 [phone]

## 2021-12-19 NOTE — ED Triage Notes (Addendum)
Pt reports cough, sinus congestion, right ear pain, intermittent body aches,sore throat. Pt also reports diarrhea and emesis but reports took otc medication and these symptoms have resolved. Pt afebrile at this time.

## 2021-12-20 LAB — COVID-19, FLU A+B NAA
Influenza A, NAA: NOT DETECTED
Influenza B, NAA: NOT DETECTED
SARS-CoV-2, NAA: NOT DETECTED

## 2021-12-21 ENCOUNTER — Ambulatory Visit (INDEPENDENT_AMBULATORY_CARE_PROVIDER_SITE_OTHER): Payer: PPO | Admitting: Family Medicine

## 2021-12-21 ENCOUNTER — Encounter: Payer: Self-pay | Admitting: Family Medicine

## 2021-12-21 ENCOUNTER — Other Ambulatory Visit: Payer: Self-pay

## 2021-12-21 VITALS — BP 142/83 | HR 75 | Temp 99.4°F | Ht 62.0 in | Wt 168.0 lb

## 2021-12-21 DIAGNOSIS — J019 Acute sinusitis, unspecified: Secondary | ICD-10-CM | POA: Diagnosis not present

## 2021-12-21 MED ORDER — AMOXICILLIN 500 MG PO CAPS: 500.0000 mg | ORAL_CAPSULE | Freq: Three times a day (TID) | ORAL | 0 refills | Status: AC

## 2021-12-21 NOTE — Progress Notes (Signed)
° °  Subjective:    Patient ID: Emily [name], female    DOB: [birth year], 71 y.o.   MRN: [id number]  HPI Sinus congestion yellow mucous, red eyes, headache, ear pressure Dry cough, sore throat And congestion drainage coughing not feeling good denies high fever chills sweats wheezing difficulty breathing  Review of Systems     Objective:   Physical Exam  Eardrums normal throat normal lungs clear heart regular      Assessment & Plan:   More than likely recent viral illness with secondary rhinosinusitis antibiotic prescribed warning signs discussed follow-up if progressive troubles truly doubt COVID.  If progressive symptoms or problems notify us or go to ER

## 2021-12-24 ENCOUNTER — Ambulatory Visit: Payer: PPO | Admitting: Orthopedic Surgery

## 2021-12-30 DIAGNOSIS — D225 Melanocytic nevi of trunk: Secondary | ICD-10-CM | POA: Diagnosis not present

## 2021-12-30 DIAGNOSIS — L988 Other specified disorders of the skin and subcutaneous tissue: Secondary | ICD-10-CM | POA: Diagnosis not present

## 2021-12-30 DIAGNOSIS — D485 Neoplasm of uncertain behavior of skin: Secondary | ICD-10-CM | POA: Diagnosis not present

## 2021-12-30 DIAGNOSIS — L820 Inflamed seborrheic keratosis: Secondary | ICD-10-CM | POA: Diagnosis not present

## 2022-01-07 ENCOUNTER — Other Ambulatory Visit: Payer: Self-pay

## 2022-01-07 ENCOUNTER — Ambulatory Visit: Payer: PPO | Admitting: Orthopedic Surgery

## 2022-01-07 ENCOUNTER — Encounter: Payer: Self-pay | Admitting: Orthopedic Surgery

## 2022-01-07 VITALS — BP 154/65 | HR 80 | Ht 62.0 in | Wt 168.0 lb

## 2022-01-07 DIAGNOSIS — M961 Postlaminectomy syndrome, not elsewhere classified: Secondary | ICD-10-CM | POA: Diagnosis not present

## 2022-01-07 NOTE — Progress Notes (Signed)
Chief Complaint  Patient presents with   Back Pain    LBP/ has seen CNS before   Emily [name] comes in to see [place] today she is status post 3 back fusion she has notes from [place] [facility] apparently she had fusion from L2-S1  She is followed by Dr. Assunta Curtis who is evaluated her and done a nerve ablation for continuing right sided leg pain starting in the lower back and radiating to the foot  Even after ablation the patient says she still having pain and had severe pain last night  Dr. Trenton Gammon she says thinks that she may have some scar tissue and Dr. Assunta Curtis thinks it is some type of soft tissue irritation  Dr. Wolfgang Phoenix is recommended that she take medicine for chronic pain although Dr. Trenton Gammon has recommended that she try to wean herself off of it  She does take hydrocodone but that requires Benadryl because of itching and then she gets very sleepy she does get some small amount of relief from Cymbalta  She has had ulcer from anti-inflammatories mainly Mobic and she is allergic to Celebrex as well  Her exam shows tenderness over the lower back with normal range of motion of the hips  We x-rayed her hips last year and they showed no disease  She is not having any groin pain so that is consistent with normal hip pathology  As I told Emily [name] I do not have anything to offer her at this time.  She has failed back syndrome which we have no experience with.  These issues are usually handled by chronic pain management specialist  Although that said I did put her on a compounding cream but other than that nothing I can really do at this point  Ketamine 5% baclofen 2%  lidocaine 5% menthol 1%

## 2022-01-15 ENCOUNTER — Other Ambulatory Visit: Payer: Self-pay | Admitting: Family Medicine

## 2022-01-15 DIAGNOSIS — I10 Essential (primary) hypertension: Secondary | ICD-10-CM | POA: Diagnosis not present

## 2022-01-15 DIAGNOSIS — M5440 Lumbago with sciatica, unspecified side: Secondary | ICD-10-CM | POA: Diagnosis not present

## 2022-01-15 DIAGNOSIS — G8929 Other chronic pain: Secondary | ICD-10-CM | POA: Diagnosis not present

## 2022-01-15 DIAGNOSIS — E785 Hyperlipidemia, unspecified: Secondary | ICD-10-CM | POA: Diagnosis not present

## 2022-01-15 DIAGNOSIS — Z79891 Long term (current) use of opiate analgesic: Secondary | ICD-10-CM | POA: Diagnosis not present

## 2022-01-16 LAB — LIPID PANEL
Chol/HDL Ratio: 3.6 ratio (ref 0.0–4.4)
Cholesterol, Total: 170 mg/dL (ref 100–199)
HDL: 47 mg/dL (ref >39)
LDL Chol Calc (NIH): 86 mg/dL (ref 0–99)
Triglycerides: 220 mg/dL — ABNORMAL HIGH (ref 0–149)
VLDL Cholesterol Cal: 37 mg/dL (ref 5–40)

## 2022-01-16 LAB — COMPREHENSIVE METABOLIC PANEL
ALT: 16 IU/L (ref 0–32)
AST: 21 IU/L (ref 0–40)
Albumin/Globulin Ratio: 1.7 (ref 1.2–2.2)
Albumin: 4.5 g/dL (ref 3.8–4.8)
Alkaline Phosphatase: 83 IU/L (ref 44–121)
BUN/Creatinine Ratio: 19 (ref 12–28)
BUN: 12 mg/dL (ref 8–27)
Bilirubin Total: 0.5 mg/dL (ref 0.0–1.2)
CO2: 25 mmol/L (ref 20–29)
Calcium: 9.3 mg/dL (ref 8.7–10.3)
Chloride: 99 mmol/L (ref 96–106)
Creatinine, Ser: 0.64 mg/dL (ref 0.57–1.00)
Globulin, Total: 2.6 g/dL (ref 1.5–4.5)
Glucose: 87 mg/dL (ref 70–99)
Potassium: 3.7 mmol/L (ref 3.5–5.2)
Sodium: 139 mmol/L (ref 134–144)
Total Protein: 7.1 g/dL (ref 6.0–8.5)
eGFR: 96 mL/min/{1.73_m2} (ref >59)

## 2022-01-18 DIAGNOSIS — D485 Neoplasm of uncertain behavior of skin: Secondary | ICD-10-CM | POA: Diagnosis not present

## 2022-01-18 DIAGNOSIS — L989 Disorder of the skin and subcutaneous tissue, unspecified: Secondary | ICD-10-CM | POA: Diagnosis not present

## 2022-01-19 ENCOUNTER — Other Ambulatory Visit: Payer: Self-pay

## 2022-01-19 ENCOUNTER — Other Ambulatory Visit: Payer: Self-pay | Admitting: *Deleted

## 2022-01-19 ENCOUNTER — Ambulatory Visit (INDEPENDENT_AMBULATORY_CARE_PROVIDER_SITE_OTHER): Payer: PPO | Admitting: Family Medicine

## 2022-01-19 VITALS — BP 115/75 | HR 76 | Temp 98.1°F | Ht 62.0 in | Wt 167.4 lb

## 2022-01-19 DIAGNOSIS — Z1211 Encounter for screening for malignant neoplasm of colon: Secondary | ICD-10-CM | POA: Diagnosis not present

## 2022-01-19 DIAGNOSIS — Z79899 Other long term (current) drug therapy: Secondary | ICD-10-CM | POA: Diagnosis not present

## 2022-01-19 DIAGNOSIS — G8929 Other chronic pain: Secondary | ICD-10-CM | POA: Diagnosis not present

## 2022-01-19 DIAGNOSIS — R52 Pain, unspecified: Secondary | ICD-10-CM | POA: Diagnosis not present

## 2022-01-19 DIAGNOSIS — M549 Dorsalgia, unspecified: Secondary | ICD-10-CM | POA: Diagnosis not present

## 2022-01-19 DIAGNOSIS — M461 Sacroiliitis, not elsewhere classified: Secondary | ICD-10-CM | POA: Diagnosis not present

## 2022-01-19 MED ORDER — HYDROCODONE-ACETAMINOPHEN 10-325 MG PO TABS: ORAL_TABLET | ORAL | 0 refills | Status: DC

## 2022-01-19 NOTE — Progress Notes (Signed)
Subjective:    Patient ID: Emily [name], female    DOB: [birth year], 71 y.o.   MRN: [id number]  HPI Patient here for follow up on pain medication and recent labs.  Patient is taking medication as prescribed.  This patient was seen today for chronic pain  The medication list was reviewed and updated.  Location of Pain for which the patient has been treated with regarding narcotics: Low back pain to the lower thoracic pain present for years  Onset of this pain: Present for years since had previous surgery has had injections nothing is seem to help   -Compliance with medication: She takes her medicine intermittently anywhere from 1 a day to a maximum of 1-1/2 a day she does not abuse the medicine she does not take it every single day averages 4 tablets/week  - Number patient states they take daily: See above  -when was the last dose patient took?  Earlier this week  The patient was advised the importance of maintaining medication and not using illegal substances with these.  Here for refills and follow up  The patient was educated that we can provide 3 monthly scripts for their medication, it is their responsibility to follow the instructions.  Side effects or complications from medications: Denies side effects or negative consequences of medicine does not make her drowsy  Patient is aware that pain medications are meant to minimize the severity of the pain to allow their pain levels to improve to allow for better function. They are aware of that pain medications cannot totally remove their pain.  Due for UDT ( at least once per year) : Today  Scale of 1 to 10 ( 1 is least 10 is most) Your pain level without the medicine: 8 Your pain level with medication 4  Scale 1 to 10 ( 1-helps very little, 10 helps very well) How well does your pain medication reduce your pain so you can function better through out the day? 7  Quality of the pain: Throbbing aching  Persistence of the  pain: Present all the time  Modifying factors: Worse with activity       Review of Systems     Objective:   Physical Exam  General-in no acute distress Eyes-no discharge Lungs-respiratory rate normal, CTA CV-no murmurs,RRR Extremities skin warm dry no edema Neuro grossly normal Behavior normal, alert Extremities no edema blood pressure rechecked      Assessment & Plan:  1. Screening for colon cancer Needs her colonoscopy updated - Ambulatory referral to Gastroenterology  2. High risk medication use We did discuss pain management Discussed goals of pain management Patient is realistic that we will help her get her pain under decent control but not completely remove it - ToxASSURE Select 13 (MW), Urine  3. Chronic midline back pain, unspecified back location Stretching exercises discussed  4. Encounter for pain management The patient was seen in followup for chronic pain. A review over at their current pain status was discussed. Drug registry was checked. Prescriptions were given.  Regular follow-up recommended. Discussion was held regarding the importance of compliance with medication as well as pain medication contract.  Patient was informed that medication may cause drowsiness and should not be combined  with other medications/alcohol or street drugs. If the patient feels medication is causing altered alertness then do not drive or operate dangerous equipment.  Should be noted that the patient appears to be meeting appropriate use of opioids and response.  Evidenced by  improved function and decent pain control without significant side effects and no evidence of overt aberrancy issues.  Upon discussion with the patient today they understand that opioid therapy is optional and they feel that the pain has been refractory to reasonable conservative measures and is significant and affecting quality of life enough to warrant ongoing therapy and wishes to continue opioids.   Refills were provided.  - ToxASSURE Select 13 (MW), Urine  5. Sacroiliitis [facility]) Patient managed by specialist in the past now she is just utilizing stretching and pain medicine as discussed above  30 pills were sent in.  She can give [place] feedback on how this is going we will send in additional medicines.  Maximum 30 pills/month

## 2022-01-20 LAB — MED LIST OPTION NOT SELECTED

## 2022-01-20 MED ORDER — HYDROCODONE-ACETAMINOPHEN 10-325 MG PO TABS: ORAL_TABLET | ORAL | 0 refills | Status: DC

## 2022-01-22 ENCOUNTER — Encounter (INDEPENDENT_AMBULATORY_CARE_PROVIDER_SITE_OTHER): Payer: Self-pay | Admitting: *Deleted

## 2022-01-24 LAB — SPECIMEN STATUS REPORT

## 2022-01-24 LAB — TOXASSURE SELECT 13 (MW), URINE

## 2022-01-27 ENCOUNTER — Telehealth: Payer: Self-pay | Admitting: Family Medicine

## 2022-01-27 NOTE — Telephone Encounter (Signed)
°  Left message for patient to call back and schedule Medicare Annual Wellness Visit (AWV) in office.   If unable to come into the office for AWV,  please offer to do virtually or by telephone.  No hx of AWV eligible for AWVI as of 08/06/2016 per [employer]  Please schedule at anytime with [facility]-Nurse Health Advisor.      40 Minutes appointment   Any questions, please call me at [phone]

## 2022-02-02 ENCOUNTER — Other Ambulatory Visit: Payer: Self-pay

## 2022-02-02 ENCOUNTER — Ambulatory Visit (INDEPENDENT_AMBULATORY_CARE_PROVIDER_SITE_OTHER): Payer: PPO

## 2022-02-02 VITALS — Ht 62.0 in | Wt 167.0 lb

## 2022-02-02 DIAGNOSIS — Z0000 Encounter for general adult medical examination without abnormal findings: Secondary | ICD-10-CM

## 2022-02-02 NOTE — Progress Notes (Signed)
Subjective:   Emily [name] is a 71 y.o. female who presents for an Initial Medicare Annual Wellness Visit. Virtual Visit via Telephone Note  I connected with  Emily [name] on 02/02/22 at  9:00 AM EST by telephone and verified that I am speaking with the correct person using two identifiers.  Location: Patient: HOME Provider: [facility] Persons participating in the virtual visit: patient/Nurse Health Advisor   I discussed the limitations, risks, security and privacy concerns of performing an evaluation and management service by telephone and the availability of in person appointments. The patient expressed understanding and agreed to proceed.  Interactive audio and video telecommunications were attempted between this nurse and patient, however failed, due to patient having technical difficulties OR patient did not have access to video capability.  We continued and completed visit with audio only.  Some vital signs may be absent or patient reported.   Emily Driver, LPN  Review of Systems     Cardiac Risk Factors include: advanced age (>32men, >57 women);hypertension;dyslipidemia;sedentary lifestyle;obesity (BMI >30kg/m2)     Objective:    Today's Vitals   02/02/22 0858 02/02/22 0859  Weight: 167 lb (75.8 kg)   Height: 5\' 2"  (1.575 m)   PainSc:  4    Body mass index is 30.54 kg/m.  Advanced Directives 02/02/2022 06/16/2021 06/12/2021 09/03/2020 01/23/2020 01/13/2020 12/26/2018  Does Patient Have a Medical Advance Directive? No No No No Yes No No  Does patient want to make changes to medical advance directive? - - - - No - Patient declined - -  Would patient like information on creating a medical advance directive? No - Patient declined No - Patient declined No - Patient declined No - Patient declined - - -    Current Medications (verified) Outpatient Encounter Medications as of 02/02/2022  Medication Sig   acetaminophen (TYLENOL) 500 MG tablet Take 1,000 mg by mouth every 8  (eight) hours as needed for mild pain or moderate pain.   amLODipine (NORVASC) 5 MG tablet Take 1 tablet (5 mg total) by mouth daily.   DHA-Vitamin C-Lutein (EYE HEALTH FORMULA PO) Take 1 tablet by mouth daily.   diazepam (VALIUM) 5 MG tablet Take 5-10 mg by mouth every 6 (six) hours as needed (nerve pain).   diphenhydrAMINE (BENADRYL ALLERGY) 25 MG tablet Take 1 tablet (25 mg total) by mouth every 4 (four) hours. Take with hydrocodone   DULoxetine (CYMBALTA) 60 MG capsule Take 1 capsule by mouth once daily   fexofenadine (ALLEGRA) 180 MG tablet Take 180 mg by mouth daily.   fluticasone (FLONASE) 50 MCG/ACT nasal spray Place 1 spray into both nostrils daily.   hydrochlorothiazide (HYDRODIURIL) 25 MG tablet Take 1 tablet (25 mg total) by mouth daily.   HYDROcodone-acetaminophen (NORCO) 10-325 MG tablet 1 twice daily maximum as needed severe pain to last 30 days   HYDROcodone-acetaminophen (NORCO) 10-325 MG tablet 1 twice daily maximum as needed pain prescription to last 30 days   potassium chloride SA (KLOR-CON) 20 MEQ tablet Take 1 tablet (20 mEq total) by mouth daily.   pravastatin (PRAVACHOL) 20 MG tablet Take 1 tablet (20 mg total) by mouth daily.   Propylene Glycol (SYSTANE BALANCE) 0.6 % SOLN Place 1 drop into both eyes 2 (two) times daily.   [DISCONTINUED] lidocaine (XYLOCAINE) 2 % solution Use as directed 10 mLs in the mouth or throat every 3 (three) hours as needed for mouth pain. (Patient not taking: Reported on 01/19/2022)   [DISCONTINUED] Misc.  Devices (TOILET SEAT ELEVATOR) MISC Elevated toilet seat if needed   [DISCONTINUED] pantoprazole (PROTONIX) 40 MG tablet Take 1 tablet (40 mg total) by mouth daily. (Patient not taking: Reported on 01/19/2022)   No facility-administered encounter medications on file as of 02/02/2022.    Allergies (verified) Codeine, Oxycodone-acetaminophen, Sulfamethoxazole-trimethoprim, Celebrex [celecoxib], Hydrocodone-acetaminophen, Losartan, Lyrica  [pregabalin], Mobic [meloxicam], and Tramadol   History: Past Medical History:  Diagnosis Date   Acid reflux    Anxiety    Arthritis    CHF (congestive heart failure) (HCC)    90's swelling body-no breathing problems at that time-no cardiac -in dr s Wolf[place] office [name] give lasix and potassium   Complication of anesthesia    History of being told she was allergic to something in 1980 no problems since   Depression    Fibromyalgia    Fibromyalgia 09/10/2019   History of blood transfusion reaction 1978   History of hiatal hernia    HNP (herniated nucleus pulposus)    Hypertension    Does not see a cardiologist no stress or echo   Joint pain    Nerve pain    right leg    Ruptured lumbar disc    Sleep apnea    Uses CPAP every night setting at 7 mm/Hg   Past Surgical History:  Procedure Laterality Date   ablation of nerves in back s1 and lumbar  05/21/2021   [place] [facility] Dr Brien Few   BACK SURGERY     x3   BLADDER REPAIR     tear during during birth of child   CARPAL TUNNEL RELEASE Bilateral    CESAREAN SECTION     CHOLECYSTECTOMY     gallbladder disease; stayed sick   COLONOSCOPY N/A 04/28/2016   Procedure: COLONOSCOPY;  Surgeon: [name], MD;  [facility]: [facility];  Service: Endoscopy;  Laterality: N/A;   ESOPHAGOGASTRODUODENOSCOPY N/A 04/28/2016   Procedure: ESOPHAGOGASTRODUODENOSCOPY (EGD);  Surgeon: [name], MD;  [facility]: [facility];  Service: Endoscopy;  Laterality: N/A;  12:00   GALLBLADDER SURGERY     KNEE ARTHROSCOPY Right 06/16/2021   Procedure: ARTHROSCOPY KNEE WITH LIMITED DEBRIDEMENT;  Surgeon: [name], MD;  Location: [facility];  Service: Orthopedics;  Laterality: Right;   knee right (torn Cart)     REPLACEMENT TOTAL KNEE     left   RIGHT/LEFT HEART CATH AND CORONARY ANGIOGRAPHY N/A 01/23/2020   Procedure: RIGHT/LEFT HEART CATH AND CORONARY ANGIOGRAPHY;  Surgeon: [name], MD;  Location: [facility];  Service:  Cardiovascular;  Laterality: N/A;   ROTATOR CUFF REPAIR Bilateral    right and left   SPINAL FUSION     TOTAL KNEE ARTHROPLASTY Right 12/29/2017   Procedure: TOTAL KNEE ARTHROPLASTY-right knee;  Surgeon: [name], MD;  Location: [facility];  Service: Orthopedics;  Laterality: Right;   TUBAL LIGATION     vein right leg removed     wrist ganglion cyst left     Family History  Problem Relation Age of Onset   Hypertension Father    Cancer Father        liposarcoma   Heart disease Mother    Cancer Other        family history    Diabetes Other        family history    Coronary artery disease Other        family history    Arthritis Other  family history    Asthma Other    Diabetes Brother    Heart disease Brother    Coronary artery disease Brother 36       CABD   Social History   Socioeconomic History   Marital status: Married    Spouse name: Eddie Dibbles   Number of children: 2   Years of education: college    Highest education level: Not on file  Occupational History   Occupation: nurse   Tobacco Use   Smoking status: Never   Smokeless tobacco: Never  Vaping Use   Vaping Use: Never used  Substance and Sexual Activity   Alcohol use: No   Drug use: No   Sexual activity: Never    Birth control/protection: Post-menopausal  Other Topics Concern   Not on file  Social History Narrative   2 daughters, 1 deceased.   3 grandsons and 1 granddaughter    Social Determinants of Radio broadcast assistant Strain: Low Risk    Difficulty of Paying Living Expenses: Not hard at all  Food Insecurity: No Food Insecurity   Worried About Charity fundraiser in the Last Year: Never true   Arboriculturist in the Last Year: Never true  Transportation Needs: No Transportation Needs   Lack of Transportation (Medical): No   Lack of Transportation (Non-Medical): No  Physical Activity: Insufficiently Active   Days of Exercise per Week: 5 days   Minutes of Exercise per Session:  20 min  Stress: No Stress Concern Present   Feeling of Stress : Only a little  Social Connections: Engineer, building services of Communication with Friends and Family: More than three times a week   Frequency of Social Gatherings with Friends and Family: More than three times a week   Attends Religious Services: More than 4 times per year   Active Member of [employer] or Organizations: Yes   Attends Music therapist: More than 4 times per year   Marital Status: Married    Tobacco Counseling Counseling given: Not Answered   Clinical Intake:  Pre-visit preparation completed: Yes  Pain : 0-10 Pain Score: 4  Pain Type: Chronic pain Pain Location: Back Pain Descriptors / Indicators: Aching, Dull, Nagging Pain Onset: More than a month ago Pain Frequency: Intermittent     BMI - recorded: 30.54 Nutritional Status: BMI > 30  Obese Nutritional Risks: None Diabetes: No  How often do you need to have someone help you when you read instructions, pamphlets, or other written materials from your doctor or pharmacy?: 1 - Never  Diabetic?No  Interpreter Needed?: No  Information entered by :: mj Cassanda Walmer, lpn   Activities of Daily Living In your present state of health, do you have any difficulty performing the following activities: 02/02/2022 06/16/2021  Hearing? N N  Vision? N Y  Difficulty concentrating or making decisions? Y N  Comment Memory at times. -  Walking or climbing stairs? N -  Dressing or bathing? N N  Doing errands, shopping? N -  Preparing Food and eating ? N -  Using the Toilet? N -  In the past six months, have you accidently leaked urine? N -  Do you have problems with loss of bowel control? N -  Managing your Medications? N -  Managing your Finances? N -  Housekeeping or managing your Housekeeping? N -  Some recent data might be hidden    Patient Care Team: Kathyrn Drown, MD as PCP -  General (Family Medicine) Fay Records, MD as PCP -  Cardiology (Cardiology)  Indicate any recent Medical Services you may have received from other than [facility] providers in the past year (date may be approximate).     Assessment:   This is a routine wellness examination for [place].  Hearing/Vision screen Hearing Screening - Comments:: Some hearing issues.  Vision Screening - Comments:: Glasses. [facility]. 2022.  Dietary issues and exercise activities discussed: Current Exercise Habits: Home exercise routine, Type of exercise: walking, Time (Minutes): 20, Frequency (Times/Week): 5, Weekly Exercise (Minutes/Week): 100, Intensity: Mild, Exercise limited by: orthopedic condition(s);cardiac condition(s)   Goals Addressed             This Visit's Progress    Exercise 3x per week (30 min per time)       Continue to exercise and eat healthy.       Depression Screen PHQ 2/9 Scores 02/02/2022 08/19/2021 04/22/2021 01/28/2021 10/22/2020 07/08/2020 10/11/2018  PHQ - 2 Score 1 0 0 5 4 5 5   PHQ- 9 Score - - - 19 16 20 15   Exception Documentation - - - - - - -  Not completed - - - - - - -    Fall Risk Fall Risk  02/02/2022 08/19/2021 04/22/2021 10/22/2020 09/10/2019  Falls in the past year? 0 0 0 1 0  Number falls in past yr: 0 0 0 1 -  Injury with Fall? 0 0 0 0 -  Risk for fall due to : No Fall Risks No Fall Risks - History of fall(s) -  Follow up Falls prevention discussed Falls evaluation completed Falls evaluation completed Education provided;Falls evaluation completed Falls evaluation completed    FALL RISK PREVENTION PERTAINING TO THE HOME:  Any stairs in or around the home? Yes  If so, are there any without handrails? No  Home free of loose throw rugs in walkways, pet beds, electrical cords, etc? Yes  Adequate lighting in your home to reduce risk of falls? Yes   ASSISTIVE DEVICES UTILIZED TO PREVENT FALLS:  Life alert? No  Use of a cane, walker or w/c? No  Grab bars in the bathroom? No  Shower chair or bench in shower? Yes   Elevated toilet seat or a handicapped toilet? Yes   TIMED UP AND GO:  Was the test performed? No .  Phone visit.  Cognitive Function:     6CIT Screen 02/02/2022  What Year? 0 points  What month? 0 points  What time? 0 points  Count back from 20 0 points  Months in reverse 0 points  Repeat phrase 0 points  Total Score 0    Immunizations Immunization History  Administered Date(s) Administered   Fluad Quad(high Dose 65+) 10/22/2020, 10/26/2021   Hepatitis B 06/13/2003, 07/16/2003, 12/16/2003   Influenza Split 10/15/2013   Influenza,inj,Quad PF,6+ Mos 11/12/2014, 11/04/2015, 11/08/2016, 11/02/2017, 10/11/2018   Influenza-Unspecified 10/17/2019   Moderna Sars-Covid-2 Vaccination 02/05/2020, 03/04/2020, 11/10/2020   Pneumococcal Conjugate-13 10/27/2019   Pneumococcal Polysaccharide-23 12/30/2017   Td 01/03/2019    TDAP status: Up to date  Flu Vaccine status: Up to date  Pneumococcal vaccine status: Up to date  Covid-19 vaccine status: Information provided on how to obtain vaccines.   Qualifies for Shingles Vaccine? Yes   Zostavax completed No   Shingrix Completed?: No.    Education has been provided regarding the importance of this vaccine. Patient has been advised to call insurance company to determine out of pocket expense if they have  not yet received this vaccine. Advised may also receive vaccine at local pharmacy or Health Dept. Verbalized acceptance and understanding.  Screening Tests Health Maintenance  Topic Date Due   Zoster Vaccines- Shingrix (1 of 2) Never done   COVID-19 Vaccine (4 - Booster for Moderna series) 01/05/2021   COLONOSCOPY (Pts 45-34yrs Insurance coverage will need to be confirmed)  04/28/2021   DEXA SCAN  07/14/2022   MAMMOGRAM  07/23/2023   TETANUS/TDAP  01/03/2029   Pneumonia Vaccine 20+ Years old  Completed   INFLUENZA VACCINE  Completed   Hepatitis C Screening  Completed   HPV VACCINES  Aged Out    Health Maintenance  Health  Maintenance Due  Topic Date Due   Zoster Vaccines- Shingrix (1 of 2) Never done   COVID-19 Vaccine (4 - Booster for Moderna series) 01/05/2021   COLONOSCOPY (Pts 45-53yrs Insurance coverage will need to be confirmed)  04/28/2021    Colorectal cancer screening: Type of screening: Colonoscopy. Completed 04/28/2016. Repeat every 5 years  Mammogram status: Completed 07/22/2021. Repeat every year  Bone Density status: Completed 07/14/2020. Results reflect: Bone density results: NORMAL. Repeat every 2 years.  Lung Cancer Screening: (Low Dose CT Chest recommended if Age 36-80 years, 30 pack-year currently smoking OR have quit w/in 15years.) does not qualify.    Additional Screening:  Hepatitis C Screening: does qualify; Completed 06/20/2017  Vision Screening: Recommended annual ophthalmology exams for early detection of glaucoma and other disorders of the eye. Is the patient up to date with their annual eye exam?  Yes  Who is the provider or what is the name of the office in which the patient attends annual eye exams? [facility] If pt is not established with a provider, would they like to be referred to a provider to establish care? No .   Dental Screening: Recommended annual dental exams for proper oral hygiene  Community Resource Referral / Chronic Care Management: CRR required this visit?  No   CCM required this visit?  No      Plan:     I have personally reviewed and noted the following in the patients chart:   Medical and social history Use of alcohol, tobacco or illicit drugs  Current medications and supplements including opioid prescriptions. Patient is currently taking opioid prescriptions. Information provided to patient regarding non-opioid alternatives. Patient advised to discuss non-opioid treatment plan with their provider. Functional ability and status Nutritional status Physical activity Advanced directives List of other physicians Hospitalizations, surgeries,  and ER visits in previous 12 months Vitals Screenings to include cognitive, depression, and falls Referrals and appointments  In addition, I have reviewed and discussed with patient certain preventive protocols, quality metrics, and best practice recommendations. A written personalized care plan for preventive services as well as general preventive health recommendations were provided to patient.     Emily Driver, LPN   [id number]   Nurse Notes: Pt is up to date on all health maintenance. Discussed Shingrix. Pt states she has paperwork to schedule Colonoscopy for this year.

## 2022-02-02 NOTE — Patient Instructions (Signed)
Emily [name] , Thank you for taking time to come for your Medicare Wellness Visit. I appreciate your ongoing commitment to your health goals. Please review the following plan we discussed and let me know if I can assist you in the future.   Screening recommendations/referrals: Colonoscopy: Done 04/28/2016. Repeat every 5 years. Mammogram: Done 07/22/2021 Repeat annually  Bone Density: Done 07/14/2020 Repeat every 2 years  Recommended yearly ophthalmology/optometry visit for glaucoma screening and checkup Recommended yearly dental visit for hygiene and checkup  Vaccinations: Influenza vaccine: Done 10/26/2021 Repeat annually  Pneumococcal vaccine: Done 12/30/2017 and 10/27/2019 Tdap vaccine: Done 01/03/2019 Repeat in 10 years  Shingles vaccine: Discussed.   Covid-19:Done 02/05/2020, 03/04/2020 and 11/10/2020.  Advanced directives: Advance directive discussed with you today. Even though you declined this today, please call our office should you change your mind, and we can give you the proper paperwork for you to fill out.   Conditions/risks identified: Aim for 30 minutes of exercise or brisk walking, 6-8 glasses of water, and 5 servings of fruits and vegetables each day.   Next appointment: Follow up in one year for your annual wellness visit 2024.   Preventive Care 70 Years and Older, Female Preventive care refers to lifestyle choices and visits with your health care provider that can promote health and wellness. What does preventive care include? A yearly physical exam. This is also called an annual well check. Dental exams once or twice a year. Routine eye exams. Ask your health care provider how often you should have your eyes checked. Personal lifestyle choices, including: Daily care of your teeth and gums. Regular physical activity. Eating a healthy diet. Avoiding tobacco and drug use. Limiting alcohol use. Practicing safe sex. Taking low-dose aspirin every day. Taking vitamin  and mineral supplements as recommended by your health care provider. What happens during an annual well check? The services and screenings done by your health care provider during your annual well check will depend on your age, overall health, lifestyle risk factors, and family history of disease. Counseling  Your health care provider may ask you questions about your: Alcohol use. Tobacco use. Drug use. Emotional well-being. Home and relationship well-being. Sexual activity. Eating habits. History of falls. Memory and ability to understand (cognition). Work and work Statistician. Reproductive health. Screening  You may have the following tests or measurements: Height, weight, and BMI. Blood pressure. Lipid and cholesterol levels. These may be checked every 5 years, or more frequently if you are over 22 years old. Skin check. Lung cancer screening. You may have this screening every year starting at age 55 if you have a 30-pack-year history of smoking and currently smoke or have quit within the past 15 years. Fecal occult blood test (FOBT) of the stool. You may have this test every year starting at age 62. Flexible sigmoidoscopy or colonoscopy. You may have a sigmoidoscopy every 5 years or a colonoscopy every 10 years starting at age 70. Hepatitis C blood test. Hepatitis B blood test. Sexually transmitted disease (STD) testing. Diabetes screening. This is done by checking your blood sugar (glucose) after you have not eaten for a while (fasting). You may have this done every 1-3 years. Bone density scan. This is done to screen for osteoporosis. You may have this done starting at age 65. Mammogram. This may be done every 1-2 years. Talk to your health care provider about how often you should have regular mammograms. Talk with your health care provider about your test results, treatment options,  and if necessary, the need for more tests. Vaccines  Your health care provider may recommend  certain vaccines, such as: Influenza vaccine. This is recommended every year. Tetanus, diphtheria, and acellular pertussis (Tdap, Td) vaccine. You may need a Td booster every 10 years. Zoster vaccine. You may need this after age 107. Pneumococcal 13-valent conjugate (PCV13) vaccine. One dose is recommended after age 23. Pneumococcal polysaccharide (PPSV23) vaccine. One dose is recommended after age 107. Talk to your health care provider about which screenings and vaccines you need and how often you need them. This information is not intended to replace advice given to you by your health care provider. Make sure you discuss any questions you have with your health care provider. Document Released: 12/19/2015 Document Revised: 08/11/2016 Document Reviewed: 09/23/2015 Elsevier Interactive Patient Education  2017 [place] Prevention in the Home Falls can cause injuries. They can happen to people of all ages. There are many things you can do to make your home safe and to help prevent falls. What can I do on the outside of my home? Regularly fix the edges of walkways and driveways and fix any cracks. Remove anything that might make you trip as you walk through a door, such as a raised step or threshold. Trim any bushes or trees on the path to your home. Use bright outdoor lighting. Clear any walking paths of anything that might make someone trip, such as rocks or tools. Regularly check to see if handrails are loose or broken. Make sure that both sides of any steps have handrails. Any raised decks and porches should have guardrails on the edges. Have any leaves, snow, or ice cleared regularly. Use sand or salt on walking paths during winter. Clean up any spills in your garage right away. This includes oil or grease spills. What can I do in the bathroom? Use night lights. Install grab bars by the toilet and in the tub and shower. Do not use towel bars as grab bars. Use non-skid mats or  decals in the tub or shower. If you need to sit down in the shower, use a plastic, non-slip stool. Keep the floor dry. Clean up any water that spills on the floor as soon as it happens. Remove soap buildup in the tub or shower regularly. Attach bath mats securely with double-sided non-slip rug tape. Do not have throw rugs and other things on the floor that can make you trip. What can I do in the bedroom? Use night lights. Make sure that you have a light by your bed that is easy to reach. Do not use any sheets or blankets that are too big for your bed. They should not hang down onto the floor. Have a firm chair that has side arms. You can use this for support while you get dressed. Do not have throw rugs and other things on the floor that can make you trip. What can I do in the kitchen? Clean up any spills right away. Avoid walking on wet floors. Keep items that you use a lot in easy-to-reach places. If you need to reach something above you, use a strong step stool that has a grab bar. Keep electrical cords out of the way. Do not use floor polish or wax that makes floors slippery. If you must use wax, use non-skid floor wax. Do not have throw rugs and other things on the floor that can make you trip. What can I do with my stairs? Do not leave  any items on the stairs. Make sure that there are handrails on both sides of the stairs and use them. Fix handrails that are broken or loose. Make sure that handrails are as long as the stairways. Check any carpeting to make sure that it is firmly attached to the stairs. Fix any carpet that is loose or worn. Avoid having throw rugs at the top or bottom of the stairs. If you do have throw rugs, attach them to the floor with carpet tape. Make sure that you have a light switch at the top of the stairs and the bottom of the stairs. If you do not have them, ask someone to add them for you. What else can I do to help prevent falls? Wear shoes that: Do not  have high heels. Have rubber bottoms. Are comfortable and fit you well. Are closed at the toe. Do not wear sandals. If you use a stepladder: Make sure that it is fully opened. Do not climb a closed stepladder. Make sure that both sides of the stepladder are locked into place. Ask someone to hold it for you, if possible. Clearly mark and make sure that you can see: Any grab bars or handrails. First and last steps. Where the edge of each step is. Use tools that help you move around (mobility aids) if they are needed. These include: Canes. Walkers. Scooters. Crutches. Turn on the lights when you go into a dark area. Replace any light bulbs as soon as they burn out. Set up your furniture so you have a clear path. Avoid moving your furniture around. If any of your floors are uneven, fix them. If there are any pets around you, be aware of where they are. Review your medicines with your doctor. Some medicines can make you feel dizzy. This can increase your chance of falling. Ask your doctor what other things that you can do to help prevent falls. This information is not intended to replace advice given to you by your health care provider. Make sure you discuss any questions you have with your health care provider. Document Released: 09/18/2009 Document Revised: 04/29/2016 Document Reviewed: 12/27/2014 Elsevier Interactive Patient Education  2017 [employer].

## 2022-03-01 DIAGNOSIS — D2372 Other benign neoplasm of skin of left lower limb, including hip: Secondary | ICD-10-CM | POA: Diagnosis not present

## 2022-03-01 DIAGNOSIS — D225 Melanocytic nevi of trunk: Secondary | ICD-10-CM | POA: Diagnosis not present

## 2022-03-01 DIAGNOSIS — Z1283 Encounter for screening for malignant neoplasm of skin: Secondary | ICD-10-CM | POA: Diagnosis not present

## 2022-03-01 DIAGNOSIS — D485 Neoplasm of uncertain behavior of skin: Secondary | ICD-10-CM | POA: Diagnosis not present

## 2022-03-01 DIAGNOSIS — D2272 Melanocytic nevi of left lower limb, including hip: Secondary | ICD-10-CM | POA: Diagnosis not present

## 2022-03-30 ENCOUNTER — Ambulatory Visit (INDEPENDENT_AMBULATORY_CARE_PROVIDER_SITE_OTHER): Payer: PPO | Admitting: Family Medicine

## 2022-03-30 VITALS — BP 118/72 | HR 81 | Temp 97.3°F | Wt 170.0 lb

## 2022-03-30 DIAGNOSIS — R52 Pain, unspecified: Secondary | ICD-10-CM

## 2022-03-30 DIAGNOSIS — M722 Plantar fascial fibromatosis: Secondary | ICD-10-CM | POA: Diagnosis not present

## 2022-03-30 MED ORDER — HYDROCODONE-ACETAMINOPHEN 10-325 MG PO TABS: ORAL_TABLET | ORAL | 0 refills | Status: DC

## 2022-03-30 NOTE — Progress Notes (Signed)
? ?  Subjective:  ? ? Patient ID: Emily [name], female    DOB: 1951/08/16, 71 y.o.   MRN: [id number] ? ?HPI ?Pt following up on visit from February. Pt tries to take one Hydrocodone a day. Does help pain but sometimes pt having to take 2. ? ?This patient was seen today for chronic pain ? ?The medication list was reviewed and updated. ? ?Location of Pain for which the patient has been treated with regarding narcotics: The pain is mainly in her lower back hurts with certain movements ? ?Onset of this pain: She has had this for years has seen specialist had tried injections physical therapy Tylenol.  Cannot take anti-inflammatories. ? ? -Compliance with medication: Takes 1 or 2/day compliant ? ?- Number patient states they take daily: 1 or 2/day ? ?-when was the last dose patient took?  Yesterday ? ?The patient was advised the importance of maintaining medication and not using illegal substances with these. ? ?Here for refills and follow up ? ?The patient was educated that we can provide 3 monthly scripts for their medication, it is their responsibility to follow the instructions. ? ?Side effects or complications from medications: Denies side effects of medication ? ?Patient is aware that pain medications are meant to minimize the severity of the pain to allow their pain levels to improve to allow for better function. They are aware of that pain medications cannot totally remove their pain. ? ?Due for UDT ( at least once per year) : We will do in the fall time ? ?Scale of 1 to 10 ( 1 is least 10 is most) ?Your pain level without the medicine: 7-8 ?Your pain level with medication 4-5 ? ?Scale 1 to 10 ( 1-helps very little, 10 helps very well) ?How well does your pain medication reduce your pain so you can function better through out the day?  7-8 ? ?Quality of the pain: Throbbing aching pain ? ?Persistence of the pain: Worse with activity persistent all the time ? ?Modifying factors: Worse with activity ? ? ?  ? ? ? Pt  has not heard from GI in regards to appointment for colonoscopy.  We did discuss how they sent her paperwork she will fill it out and send ? ? ?Review of Systems ? ?   ?Objective:  ? Physical Exam ?General-in no acute distress ?Eyes-no discharge ?Lungs-respiratory rate normal, CTA ?CV-no murmurs,RRR ?Extremities skin warm dry no edema ?Neuro grossly normal ?Behavior normal, alert ?Subjective discomfort in the lower back and into the right knee ? ?Patient also with characteristic findings of plantar fasciitis stretches were shown ? ? ? ?   ?Assessment & Plan:  ? ?Plantar fasciitis stretches shown and persistent problems follow-up with podiatry ? ?Recommend pain medicine to be adjusted to 45 tablets/month that is enough for half the days to be 2 tablets and the other half the days to be 1 tablet she feels that is adequate enough to help her tolerate her pain we did discuss the importance of never driving when feeling drowsy plus also keeping medication in a safe place ? ?Follow-up in 3 months ?

## 2022-05-06 ENCOUNTER — Encounter (HOSPITAL_COMMUNITY): Payer: Self-pay | Admitting: Emergency Medicine

## 2022-05-06 ENCOUNTER — Other Ambulatory Visit: Payer: Self-pay

## 2022-05-06 ENCOUNTER — Emergency Department (HOSPITAL_COMMUNITY)
Admission: EM | Admit: 2022-05-06 | Discharge: 2022-05-06 | Disposition: A | Discharge status: Home / Self Care | Payer: PPO | Attending: Emergency Medicine | Admitting: Emergency Medicine

## 2022-05-06 ENCOUNTER — Emergency Department (HOSPITAL_COMMUNITY): Payer: PPO

## 2022-05-06 DIAGNOSIS — I10 Essential (primary) hypertension: Secondary | ICD-10-CM | POA: Insufficient documentation

## 2022-05-06 DIAGNOSIS — R079 Chest pain, unspecified: Secondary | ICD-10-CM | POA: Diagnosis not present

## 2022-05-06 DIAGNOSIS — Z79899 Other long term (current) drug therapy: Secondary | ICD-10-CM | POA: Insufficient documentation

## 2022-05-06 DIAGNOSIS — R0789 Other chest pain: Secondary | ICD-10-CM | POA: Diagnosis not present

## 2022-05-06 DIAGNOSIS — E876 Hypokalemia: Secondary | ICD-10-CM | POA: Diagnosis not present

## 2022-05-06 LAB — CBC
HCT: 38 % (ref 36.0–46.0)
Hemoglobin: 13.4 g/dL (ref 12.0–15.0)
MCH: 32.2 pg (ref 26.0–34.0)
MCHC: 35.3 g/dL (ref 30.0–36.0)
MCV: 91.3 fL (ref 80.0–100.0)
Platelets: 215 10*3/uL (ref 150–400)
RBC: 4.16 MIL/uL (ref 3.87–5.11)
RDW: 12.4 % (ref 11.5–15.5)
WBC: 6.6 10*3/uL (ref 4.0–10.5)
nRBC: 0 % (ref 0.0–0.2)

## 2022-05-06 LAB — BASIC METABOLIC PANEL
Anion gap: 5 (ref 5–15)
BUN: 12 mg/dL (ref 8–23)
CO2: 30 mmol/L (ref 22–32)
Calcium: 9 mg/dL (ref 8.9–10.3)
Chloride: 105 mmol/L (ref 98–111)
Creatinine, Ser: 0.48 mg/dL (ref 0.44–1.00)
GFR, Estimated: >60 mL/min (ref >60)
Glucose, Bld: 94 mg/dL (ref 70–99)
Potassium: 3.2 mmol/L — ABNORMAL LOW (ref 3.5–5.1)
Sodium: 140 mmol/L (ref 135–145)

## 2022-05-06 LAB — HEPATIC FUNCTION PANEL
ALT: 18 U/L (ref 0–44)
AST: 18 U/L (ref 15–41)
Albumin: 4 g/dL (ref 3.5–5.0)
Alkaline Phosphatase: 67 U/L (ref 38–126)
Bilirubin, Direct: 0.1 mg/dL (ref 0.0–0.2)
Indirect Bilirubin: 0.6 mg/dL (ref 0.3–0.9)
Total Bilirubin: 0.7 mg/dL (ref 0.3–1.2)
Total Protein: 6.7 g/dL (ref 6.5–8.1)

## 2022-05-06 LAB — TROPONIN I (HIGH SENSITIVITY)
Troponin I (High Sensitivity): 2 ng/L (ref <18)
Troponin I (High Sensitivity): 3 ng/L (ref <18)

## 2022-05-06 MED ORDER — NITROGLYCERIN 0.4 MG SL SUBL
0.4000 mg | SUBLINGUAL_TABLET | Freq: Once | SUBLINGUAL | Status: AC
  Administered 2022-05-06: 0.4 mg via SUBLINGUAL
  Filled 2022-05-06: qty 1

## 2022-05-06 MED ORDER — PANTOPRAZOLE SODIUM 20 MG PO TBEC: 20.0000 mg | DELAYED_RELEASE_TABLET | Freq: Every day | ORAL | 0 refills | Status: DC

## 2022-05-06 MED ORDER — SODIUM CHLORIDE 0.9 % IV BOLUS
500.0000 mL | Freq: Once | INTRAVENOUS | Status: AC
  Administered 2022-05-06: 500 mL via INTRAVENOUS

## 2022-05-06 MED ORDER — NITROGLYCERIN 0.4 MG SL SUBL
0.4000 mg | SUBLINGUAL_TABLET | SUBLINGUAL | Status: DC | PRN
  Administered 2022-05-06: 0.4 mg via SUBLINGUAL

## 2022-05-06 NOTE — ED Triage Notes (Signed)
Pt having mid-sternal left-sided chest pain, radiating to left arm and back, rated 6/10.

## 2022-05-06 NOTE — ED Provider Notes (Signed)
[facility] Provider Note   CSN: [id number] Arrival date & time: 05/06/22  1214     History  Chief Complaint  Patient presents with   Chest Pain    Emily [name] is a 71 y.o. female.  Patient with history of hypertension.  Patient complains of some substernal chest discomfort.  The pain started today.  She has no shortness of breath no sweating  The history is provided by the patient and medical records. No language interpreter was used.  Chest Pain Pain location:  L chest Pain quality: aching   Pain radiates to:  Does not radiate Pain severity:  Moderate Onset quality:  Sudden Timing:  Intermittent Chronicity:  New Context: not breathing   Associated symptoms: no abdominal pain, no back pain, no cough, no fatigue and no headache       Home Medications Prior to Admission medications   Medication Sig Start Date End Date Taking? Authorizing Provider  acetaminophen (TYLENOL) 500 MG tablet Take 1,000 mg by mouth every 8 (eight) hours as needed for mild pain or moderate pain.    [provider]  amLODipine (NORVASC) 5 MG tablet Take 1 tablet (5 mg total) by mouth daily. 09/16/21   Kathyrn Drown, MD  DHA-Vitamin C-Lutein (EYE HEALTH FORMULA PO) Take 1 tablet by mouth daily.    [provider]  diazepam (VALIUM) 5 MG tablet Take 5-10 mg by mouth every 6 (six) hours as needed (nerve pain).    [provider]  diphenhydrAMINE (BENADRYL ALLERGY) 25 MG tablet Take 1 tablet (25 mg total) by mouth every 4 (four) hours. Take with hydrocodone 12/31/17   [name], MD  DULoxetine (CYMBALTA) 60 MG capsule Take 1 capsule by mouth once daily 10/23/21   Kathyrn Drown, MD  fexofenadine (ALLEGRA) 180 MG tablet Take 180 mg by mouth daily.    [provider]  fluticasone (FLONASE) 50 MCG/ACT nasal spray Place 1 spray into both nostrils daily. 08/18/21   Lamptey, Myrene Galas, MD  hydrochlorothiazide (HYDRODIURIL) 25 MG tablet Take 1  tablet (25 mg total) by mouth daily. 09/16/21   Kathyrn Drown, MD  HYDROcodone-acetaminophen (NORCO) 10-325 MG tablet 1 twice daily maximum as needed severe pain to last 30 days 03/30/22   Kathyrn Drown, MD  HYDROcodone-acetaminophen [facility]) 10-325 MG tablet 1 twice daily maximum as needed pain prescription to last 30 days 03/30/22   Kathyrn Drown, MD  HYDROcodone-acetaminophen [facility]) 10-325 MG tablet May take twice a day prn pain caution drowsiness 03/30/22   Kathyrn Drown, MD  potassium chloride SA (KLOR-CON) 20 MEQ tablet Take 1 tablet (20 mEq total) by mouth daily. 09/16/21   Kathyrn Drown, MD  pravastatin (PRAVACHOL) 20 MG tablet Take 1 tablet (20 mg total) by mouth daily. 09/16/21   Kathyrn Drown, MD  Propylene Glycol (SYSTANE BALANCE) 0.6 % SOLN Place 1 drop into both eyes 2 (two) times daily.    [provider]      Allergies    Codeine, Oxycodone-acetaminophen, Sulfamethoxazole-trimethoprim, Celebrex [celecoxib], Hydrocodone-acetaminophen, Losartan, Lyrica [pregabalin], Mobic [meloxicam], and Tramadol    Review of Systems   Review of Systems  Constitutional:  Negative for appetite change and fatigue.  HENT:  Negative for congestion, ear discharge and sinus pressure.   Eyes:  Negative for discharge.  Respiratory:  Negative for cough.   Cardiovascular:  Positive for chest pain.  Gastrointestinal:  Negative for abdominal pain and diarrhea.  Genitourinary:  Negative  for frequency and hematuria.  Musculoskeletal:  Negative for back pain.  Skin:  Negative for rash.  Neurological:  Negative for seizures and headaches.  Psychiatric/Behavioral:  Negative for hallucinations.    Physical Exam Updated Vital Signs BP 115/66   Pulse 65   Temp 98.2 F (36.8 C) (Oral)   Resp 18   Ht '5\' 2"'$  (1.575 m)   Wt 74.8 kg   SpO2 95%   BMI 30.18 kg/m  Physical Exam Vitals and nursing note reviewed.  Constitutional:      Appearance: She is well-developed.  HENT:     Head:  Normocephalic.     Nose: Nose normal.  Eyes:     General: No scleral icterus.    Conjunctiva/sclera: Conjunctivae normal.  Neck:     Thyroid: No thyromegaly.  Cardiovascular:     Rate and Rhythm: Normal rate and regular rhythm.     Heart sounds: No murmur heard.   No friction rub. No gallop.  Pulmonary:     Breath sounds: No stridor. No wheezing or rales.  Chest:     Chest wall: No tenderness.  Abdominal:     General: There is no distension.     Tenderness: There is no abdominal tenderness. There is no rebound.  Musculoskeletal:        General: Normal range of motion.     Cervical back: Neck supple.  Lymphadenopathy:     Cervical: No cervical adenopathy.  Skin:    Findings: No erythema or rash.  Neurological:     Mental Status: She is alert and oriented to person, place, and time.     Motor: No abnormal muscle tone.     Coordination: Coordination normal.  Psychiatric:        Behavior: Behavior normal.    ED Results / Procedures / Treatments   Labs (all labs ordered are listed, but only abnormal results are displayed) Labs Reviewed  BASIC METABOLIC PANEL - Abnormal; Notable for the following components:      Result Value   Potassium 3.2 (*)    All other components within normal limits  CBC  HEPATIC FUNCTION PANEL  TROPONIN I (HIGH SENSITIVITY)  TROPONIN I (HIGH SENSITIVITY)    EKG EKG Interpretation  Date/Time:  Thursday May 06 2022 12:25:40 EDT Ventricular Rate:  66 PR Interval:  196 QRS Duration: 86 QT Interval:  402 QTC Calculation: 421 R Axis:   -10 Text Interpretation: Normal sinus rhythm Normal ECG When compared with ECG of 12-Jun-2021 11:36, No significant change was found Confirmed by Milton Ferguson [id number]) on 05/06/2022 2:57:23 PM  Radiology DG Chest 2 View  Result Date: 05/06/2022 CLINICAL DATA:  Chest pain beginning today EXAM: CHEST - 2 VIEW COMPARISON:  01/13/2020 FINDINGS: Heart size is normal. Aortic atherosclerosis and tortuosity. The lungs  are clear except for mild scarring at the bases. Vascularity is normal. No effusion. No significant bone finding in the chest. Previous lumbar fusion. IMPRESSION: No active cardiopulmonary disease. Aortic atherosclerosis. Basilar pulmonary scarring. Electronically Signed   By: Nelson Chimes M.D.   On: 05/06/2022 12:49    Procedures Procedures    Medications Ordered in ED Medications  nitroGLYCERIN (NITROSTAT) SL tablet 0.4 mg (0.4 mg Sublingual Given 05/06/22 1344)  sodium chloride 0.9 % bolus 500 mL (0 mLs Intravenous Stopped 05/06/22 1423)  nitroGLYCERIN (NITROSTAT) SL tablet 0.4 mg (0.4 mg Sublingual Given 05/06/22 1331)    ED Course/ Medical Decision Making/ A&P Patient with chest pain history of hypertension and normal  cath 2 years ago.  EKG unremarkable.  First troponin negative.                         Medical Decision Making Amount and/or Complexity of Data Reviewed Labs: ordered. Radiology: ordered.  Risk Prescription drug management.  This patient presents to the ED for concern of chest pain, this involves an extensive number of treatment options, and is a complaint that carries with it a high risk of complications and morbidity.  The differential diagnosis includes MI PE   Co morbidities that complicate the patient evaluation  Hypertension   Additional history obtained:  Additional history obtained from patient External records from outside source obtained and reviewed including hospital record   Lab Tests:  I Ordered, and personally interpreted labs.  The pertinent results include: CBC and chemistries negative except for mild low potassium, troponins negative x2   Imaging Studies ordered:  I ordered imaging studies including chest x-ray I independently visualized and interpreted imaging which showed no acute disease I agree with the radiologist interpretation   Cardiac Monitoring: / EKG:  The patient was maintained on a cardiac monitor.  I personally viewed  and interpreted the cardiac monitored which showed an underlying rhythm of: Normal sinus rhythm   Consultations Obtained:  No consult Problem List / ED Course / Critical interventions / Medication management  Chest pain I ordered medication including nitroglycerin and saline Reevaluation of the patient after these medicines showed that the patient stayed the same I have reviewed the patients home medicines and have made adjustments as needed   Social Determinants of Health:  None   Test / Admission - Considered:  CT chest  Patient with atypical chest pain.  Normal troponins and unremarkable EKG she is discharged home to follow-up with her cardiology        Final Clinical Impression(s) / ED Diagnoses Final diagnoses:  None    Rx / DC Orders ED Discharge Orders     None         Milton Ferguson, MD 05/11/22 1103

## 2022-05-06 NOTE — Discharge Instructions (Signed)
Follow-up with Dr. Harl Bowie or one of his colleagues next week for recheck

## 2022-05-06 NOTE — ED Notes (Signed)
Patient transported to X-ray 

## 2022-05-10 NOTE — Progress Notes (Deleted)
Cardiology Office Note:    Date:  05/10/2022   ID:  Emily [name], DOB [birth year], MRN [id number]  PCP:  Kathyrn Drown, MD   [facility] Providers Cardiologist:  Dorris Carnes, MD { Click to update primary MD,subspecialty MD or APP then REFRESH:1}    Referring MD: Kathyrn Drown, MD   Chief Complaint: ***  History of Present Illness:    Emily [name] is a *** 70 y.o. female with a hx of CAD, hypertension, hyperlipidemia, fibromyalgia, and GERD  On 01/18/2020 she reported chest pain that radiated into her back and left axilla a few days prior.  She reported associated dyspnea and nausea at the time and also reported worsening dyspnea on exertion for the previous 6 months.  She was scheduled for cardiac catheterization on 01/23/2020 which showed torturous but otherwise normal coronary arteries without obstructive disease, normal LV function with LVEDP at 13 mmHg and normal right heart pressures.  It was recommended that she have a transthoracic echocardiogram to quantify the extent of LVH and possible diastolic dysfunction in the setting of her DOE.  Echo revealed normal pumping function with preserved EF of 60 to 65%, no significant valve abnormalities.  She did have mild thickness of the heart muscle and slightly abnormal relaxation which is likely secondary to known hypertension.  She was last seen in our office by Dr. Harrington Challenger on 07/15/21 at which time she reported some SOB with activity. BP was well controlled and she was advised to follow-up in 1 year.  Today, she is here after calling our office   Past Medical History:  Diagnosis Date   Acid reflux    Anxiety    Arthritis    CHF (congestive heart failure) (HCC)    90's swelling body-no breathing problems at that time-no cardiac -in dr s Wolfgang [place] office [name] give lasix and potassium   Complication of anesthesia    History of being told she was allergic to something in 1980 no problems since   Depression    Fibromyalgia     Fibromyalgia 09/10/2019   History of blood transfusion reaction 1978   History of hiatal hernia    HNP (herniated nucleus pulposus)    Hypertension    Does not see a cardiologist no stress or echo   Joint pain    Nerve pain    right leg    Ruptured lumbar disc    Sleep apnea    Uses CPAP every night setting at 7 mm/Hg    Past Surgical History:  Procedure Laterality Date   ablation of nerves in back s1 and lumbar  05/21/2021   [facility] Dr Brien Few   BACK SURGERY     x3   BLADDER REPAIR     tear during during birth of child   CARPAL TUNNEL RELEASE Bilateral    CESAREAN SECTION     CHOLECYSTECTOMY     gallbladder disease; stayed sick   COLONOSCOPY N/A 04/28/2016   Procedure: COLONOSCOPY;  Surgeon: [name], MD;  Location: [facility];  Service: Endoscopy;  Laterality: N/A;   ESOPHAGOGASTRODUODENOSCOPY N/A 04/28/2016   Procedure: ESOPHAGOGASTRODUODENOSCOPY (EGD);  Surgeon: [name], MD;  Location: [facility];  Service: Endoscopy;  Laterality: N/A;  12:00   GALLBLADDER SURGERY     KNEE ARTHROSCOPY Right 06/16/2021   Procedure: ARTHROSCOPY KNEE WITH LIMITED DEBRIDEMENT;  Surgeon: [name], MD;  Location: [facility];  Service: Orthopedics;  Laterality: Right;   knee right (torn  Cart)     REPLACEMENT TOTAL KNEE     left   RIGHT/LEFT HEART CATH AND CORONARY ANGIOGRAPHY N/A 01/23/2020   Procedure: RIGHT/LEFT HEART CATH AND CORONARY ANGIOGRAPHY;  Surgeon: [name], MD;  Location: [facility];  Service: Cardiovascular;  Laterality: N/A;   ROTATOR CUFF REPAIR Bilateral    right and left   SPINAL FUSION     TOTAL KNEE ARTHROPLASTY Right 12/29/2017   Procedure: TOTAL KNEE ARTHROPLASTY-right knee;  Surgeon: [name], MD;  Location: [facility];  Service: Orthopedics;  Laterality: Right;   TUBAL LIGATION     vein right leg removed     wrist ganglion cyst left      Current Medications: No outpatient medications have been marked as taking  for the 05/11/22 encounter (Appointment) with [name], NP.     Allergies:   Codeine, Oxycodone-acetaminophen, Sulfamethoxazole-trimethoprim, Celebrex [celecoxib], Hydrocodone-acetaminophen, Losartan, Lyrica [pregabalin], Mobic [meloxicam], and Tramadol   Social History   Socioeconomic History   Marital status: Married    Spouse name: Eddie Dibbles   Number of children: 2   Years of education: college    Highest education level: Not on file  Occupational History   Occupation: nurse   Tobacco Use   Smoking status: Never   Smokeless tobacco: Never  Vaping Use   Vaping Use: Never used  Substance and Sexual Activity   Alcohol use: No   Drug use: No   Sexual activity: Never    Birth control/protection: Post-menopausal  Other Topics Concern   Not on file  Social History Narrative   2 daughters, 1 deceased.   3 grandsons and 1 granddaughter    Social Determinants of Radio broadcast assistant Strain: Low Risk    Difficulty of Paying Living Expenses: Not hard at all  Food Insecurity: No Food Insecurity   Worried About Charity fundraiser in the Last Year: Never true   Arboriculturist in the Last Year: Never true  Transportation Needs: No Transportation Needs   Lack of Transportation (Medical): No   Lack of Transportation (Non-Medical): No  Physical Activity: Insufficiently Active   Days of Exercise per Week: 5 days   Minutes of Exercise per Session: 20 min  Stress: No Stress Concern Present   Feeling of Stress : Only a little  Social Connections: Engineer, building services of Communication with Friends and Family: More than three times a week   Frequency of Social Gatherings with Friends and Family: More than three times a week   Attends Religious Services: More than 4 times per year   Active Member of [employer] or Organizations: Yes   Attends Music therapist: More than 4 times per year   Marital Status: Married     Family History: The patient's  ***family history includes Arthritis in an other family member; Asthma in an other family member; Cancer in her father and another family member; Coronary artery disease in an other family member; Coronary artery disease (age of onset: 49) in her brother; Diabetes in her brother and another family member; Heart disease in her brother and mother; Hypertension in her father.  ROS:   Please see the history of present illness.    *** All other systems reviewed and are negative.  Labs/Other Studies Reviewed:    The following studies were reviewed today:  Echo 02/14/20  1. Left ventricular ejection fraction, by estimation, is 60 to 65%. The  left ventricle has  normal function. The left ventricle has no regional  wall motion abnormalities. There is mild left ventricular hypertrophy.  Left ventricular diastolic parameters  are consistent with Grade I diastolic dysfunction (impaired relaxation).   2. Right ventricular systolic function is normal. The right ventricular  size is normal. There is normal pulmonary artery systolic pressure.   3. Left atrial size was mildly dilated.   4. The mitral valve is grossly normal. Trivial mitral valve  regurgitation.   5. The aortic valve is tricuspid. Aortic valve regurgitation is not  visualized.   6. The inferior vena cava is normal in size with greater than 50%  respiratory variability, suggesting right atrial pressure of 3 mmHg.  1. Left ventricular ejection fraction, by estimation, is 60 to 65%. The  left ventricle has normal function. The left ventricle has no regional  wall motion abnormalities. There is mild left ventricular hypertrophy.  Left ventricular diastolic parameters  are consistent with Grade I diastolic dysfunction (impaired relaxation).   2. Right ventricular systolic function is normal. The right ventricular  size is normal. There is normal pulmonary artery systolic pressure.   3. Left atrial size was mildly dilated.   4. The mitral  valve is grossly normal. Trivial mitral valve  regurgitation.   5. The aortic valve is tricuspid. Aortic valve regurgitation is not  visualized.   6. The inferior vena cava is normal in size with greater than 50%  respiratory variability, suggesting right atrial pressure of 3 mmHg.   [facility] 01/2020  The left ventricular systolic function is normal.   Tortuous but otherwise normal coronary arteries without obstructive disease.   Normal LV function with left ventricular hypertrophy; LVEDP 13 mm   Normal right heart pressures.   RECOMMENDATION: Suspect nonischemic chest pain.  Recommend 2D echo Doppler to quantify extent of LVH and potential for diastolic dysfunction contributing to the patient's exertional dyspnea.  Patient will follow up with Dr. Dorris Carnes.   Recent Labs: 05/06/2022: ALT 18; BUN 12; Creatinine, Ser 0.48; Hemoglobin 13.4; Platelets 215; Potassium 3.2; Sodium 140  Recent Lipid Panel    Component Value Date/Time   CHOL 170 01/15/2022 0906   TRIG 220 (H) 01/15/2022 0906   HDL 47 01/15/2022 0906   CHOLHDL 3.6 01/15/2022 0906   CHOLHDL 4.5 08/20/2013 0741   VLDL 59 (H) 08/20/2013 0741   LDLCALC 86 01/15/2022 0906     Risk Assessment/Calculations:   {Does this patient have ATRIAL FIBRILLATION?:[phone]}       Physical Exam:    VS:  There were no vitals taken for this visit.    Wt Readings from Last 3 Encounters:  05/06/22 165 lb (74.8 kg)  03/30/22 170 lb (77.1 kg)  02/02/22 167 lb (75.8 kg)     GEN: *** Well nourished, well developed in no acute distress HEENT: Normal NECK: No JVD; No carotid bruits CARDIAC: ***RRR, no murmurs, rubs, gallops RESPIRATORY:  Clear to auscultation without rales, wheezing or rhonchi  ABDOMEN: Soft, non-tender, non-distended MUSCULOSKELETAL:  No edema; No deformity. *** pedal pulses, ***bilaterally SKIN: Warm and dry NEUROLOGIC:  Alert and oriented x 3 PSYCHIATRIC:  Normal affect   EKG:  EKG is *** ordered today.  The  ekg ordered today demonstrates ***  Diagnoses:    No diagnosis found. Assessment and Plan:     Chest pain Chronic HFpEF: Hyperlipidemia:  Hypertension:   {Are you ordering a CV Procedure (e.g. stress test, cath, DCCV, TEE, etc)?   Press F2        :  [id number]}    Medication Adjustments/Labs and Tests Ordered: Current medicines are reviewed at length with the patient today.  Concerns regarding medicines are outlined above.  No orders of the defined types were placed in this encounter.  No orders of the defined types were placed in this encounter.   There are no Patient Instructions on file for this visit.   Signed, Emmaline Life, NP  05/10/2022 5:03 PM    [facility]

## 2022-05-11 ENCOUNTER — Ambulatory Visit: Payer: PPO | Admitting: Nurse Practitioner

## 2022-05-11 NOTE — Progress Notes (Unsigned)
Cardiology Office Note:    Date:  05/12/2022   ID:  Emily [name], DOB Dec 26, 1950, MRN [id number]  PCP:  Kathyrn Drown, MD   [facility] Providers Cardiologist:  Dorris Carnes, MD     Referring MD: Kathyrn Drown, MD   Chief Complaint: follow-up chest pain  History of Present Illness:    Emily [name] is a very pleasant 70 y.o. female with a hx of CAD, hypertension, hyperlipidemia, fibromyalgia, and GERD.   On 01/18/2020 she reported chest pain that radiated into her back and left axilla a few days prior.  She reported associated dyspnea and nausea at the time and also reported worsening dyspnea on exertion for the previous 6 months.  She was scheduled for cardiac catheterization on 01/23/2020 which showed torturous but otherwise normal coronary arteries without obstructive disease, normal LV function with LVEDP at 13 mmHg and normal right heart pressures.  It was recommended that she have a transthoracic echocardiogram to quantify the extent of LVH and possible diastolic dysfunction in the setting of her DOE.  Echo revealed normal pumping function with preserved EF of 60 to 65%, no significant valve abnormalities.  She did have mild thickness of the heart muscle and slightly abnormal relaxation which is likely secondary to known hypertension.  She was last seen in our office by Dr. Harrington Challenger on 07/15/21 at which time she reported some SOB with activity. BP was well controlled and she was advised to follow-up in 1 year.  Today, she is here after calling our office with concerns for chest pain.  Had chest pressure while riding in the car this morning, smelled smoke from something burning. On a previous occasion, was sitting at her sewing machine. Pain occurred center chest and left side and sometimes radiates to her back. BP elevated at those times, otherwise BP is normal range from her is 120s over 70s. SOB sometimes when she is active,  will occasionally also have chest discomfort with activity.  Walks, gardens for exercise.  She denies palpitations, edema, orthopnea, PND, presyncope, syncope. Careful to avoid sodium   Past Medical History:  Diagnosis Date   Acid reflux    Anxiety    Arthritis    CHF (congestive heart failure) (HCC)    90's swelling body-no breathing problems at that time-no cardiac -in dr s Wolfgang [place] office [name] give lasix and potassium   Complication of anesthesia    History of being told she was allergic to something in 1980 no problems since   Depression    Fibromyalgia    Fibromyalgia 09/10/2019   History of blood transfusion reaction 1978   History of hiatal hernia    HNP (herniated nucleus pulposus)    Hypertension    Does not see a cardiologist no stress or echo   Joint pain    Nerve pain    right leg    Ruptured lumbar disc    Sleep apnea    Uses CPAP every night setting at 7 mm/Hg    Past Surgical History:  Procedure Laterality Date   ablation of nerves in back s1 and lumbar  05/21/2021   [place] nuero Dr Brien Few   BACK SURGERY     x3   BLADDER REPAIR     tear during during birth of child   CARPAL TUNNEL RELEASE Bilateral    CESAREAN SECTION     CHOLECYSTECTOMY     gallbladder disease; stayed sick   COLONOSCOPY N/A 04/28/2016   Procedure: COLONOSCOPY;  Surgeon: [name], MD;  Location: [facility];  Service: Endoscopy;  Laterality: N/A;   ESOPHAGOGASTRODUODENOSCOPY N/A 04/28/2016   Procedure: ESOPHAGOGASTRODUODENOSCOPY (EGD);  Surgeon: [name], MD;  Location: [facility];  Service: Endoscopy;  Laterality: N/A;  12:00   GALLBLADDER SURGERY     KNEE ARTHROSCOPY Right 06/16/2021   Procedure: ARTHROSCOPY KNEE WITH LIMITED DEBRIDEMENT;  Surgeon: [name], MD;  Location: [facility];  Service: Orthopedics;  Laterality: Right;   knee right (torn Cart)     REPLACEMENT TOTAL KNEE     left   RIGHT/LEFT HEART CATH AND CORONARY ANGIOGRAPHY N/A 01/23/2020   Procedure: RIGHT/LEFT HEART CATH AND CORONARY ANGIOGRAPHY;   Surgeon: [name], MD;  Location: [place] [facility];  Service: Cardiovascular;  Laterality: N/A;   ROTATOR CUFF REPAIR Bilateral    right and left   SPINAL FUSION     TOTAL KNEE ARTHROPLASTY Right 12/29/2017   Procedure: TOTAL KNEE ARTHROPLASTY-right knee;  Surgeon: [name], MD;  Location: [facility];  Service: Orthopedics;  Laterality: Right;   TUBAL LIGATION     vein right leg removed     wrist ganglion cyst left      Current Medications: Current Meds  Medication Sig   acetaminophen (TYLENOL) 500 MG tablet Take 1,000 mg by mouth every 8 (eight) hours as needed for mild pain or moderate pain.   amLODipine (NORVASC) 5 MG tablet Take 1 tablet (5 mg total) by mouth daily.   DHA-Vitamin C-Lutein (EYE HEALTH FORMULA PO) Take 1 tablet by mouth daily.   diazepam (VALIUM) 5 MG tablet Take 5-10 mg by mouth every 6 (six) hours as needed (nerve pain).   diphenhydrAMINE (BENADRYL ALLERGY) 25 MG tablet Take 1 tablet (25 mg total) by mouth every 4 (four) hours. Take with hydrocodone   DULoxetine (CYMBALTA) 60 MG capsule Take 1 capsule by mouth once daily   ezetimibe (ZETIA) 10 MG tablet Take 1 tablet (10 mg total) by mouth daily.   fexofenadine (ALLEGRA) 180 MG tablet Take 180 mg by mouth daily.   fluticasone (FLONASE) 50 MCG/ACT nasal spray Place 1 spray into both nostrils daily.   hydrochlorothiazide (HYDRODIURIL) 25 MG tablet Take 1 tablet (25 mg total) by mouth daily.   HYDROcodone-acetaminophen (NORCO) 10-325 MG tablet May take twice a day prn pain caution drowsiness   pantoprazole (PROTONIX) 20 MG tablet Take 1 tablet (20 mg total) by mouth daily.   potassium chloride SA (KLOR-CON) 20 MEQ tablet Take 1 tablet (20 mEq total) by mouth daily.   pravastatin (PRAVACHOL) 20 MG tablet Take 1 tablet (20 mg total) by mouth daily.   Propylene Glycol (SYSTANE BALANCE) 0.6 % SOLN Place 1 drop into both eyes 2 (two) times daily.   [DISCONTINUED] HYDROcodone-acetaminophen (NORCO) 10-325 MG  tablet 1 twice daily maximum as needed severe pain to last 30 days   [DISCONTINUED] HYDROcodone-acetaminophen (NORCO) 10-325 MG tablet 1 twice daily maximum as needed pain prescription to last 30 days     Allergies:   Codeine, Oxycodone-acetaminophen, Sulfamethoxazole-trimethoprim, Celebrex [celecoxib], Hydrocodone-acetaminophen, Losartan, Lyrica [pregabalin], Mobic [meloxicam], and Tramadol   Social History   Socioeconomic History   Marital status: Married    Spouse name: Eddie Dibbles   Number of children: 2   Years of education: college    Highest education level: Not on file  Occupational History   Occupation: nurse   Tobacco Use   Smoking status: Never   Smokeless tobacco: Never  Vaping Use  Vaping Use: Never used  Substance and Sexual Activity   Alcohol use: No   Drug use: No   Sexual activity: Never    Birth control/protection: Post-menopausal  Other Topics Concern   Not on file  Social History Narrative   2 daughters, 1 deceased.   3 grandsons and 1 granddaughter    Social Determinants of Radio broadcast assistant Strain: Low Risk    Difficulty of Paying Living Expenses: Not hard at all  Food Insecurity: No Food Insecurity   Worried About Charity fundraiser in the Last Year: Never true   Arboriculturist in the Last Year: Never true  Transportation Needs: No Transportation Needs   Lack of Transportation (Medical): No   Lack of Transportation (Non-Medical): No  Physical Activity: Insufficiently Active   Days of Exercise per Week: 5 days   Minutes of Exercise per Session: 20 min  Stress: No Stress Concern Present   Feeling of Stress : Only a little  Social Connections: Engineer, building services of Communication with Friends and Family: More than three times a week   Frequency of Social Gatherings with Friends and Family: More than three times a week   Attends Religious Services: More than 4 times per year   Active Member of [employer] or Organizations: Yes    Attends Music therapist: More than 4 times per year   Marital Status: Married     Family History: The patient's family history includes Arthritis in an other family member; Asthma in an other family member; Cancer in her father and another family member; Coronary artery disease in an other family member; Coronary artery disease (age of onset: 74) in her brother; Diabetes in her brother and another family member; Heart disease in her brother and mother; Hypertension in her father.  ROS:   Please see the history of present illness.    + chest pain All other systems reviewed and are negative.  Labs/Other Studies Reviewed:    The following studies were reviewed today:  Echo 02/14/20  1. Left ventricular ejection fraction, by estimation, is 60 to 65%. The  left ventricle has normal function. The left ventricle has no regional  wall motion abnormalities. There is mild left ventricular hypertrophy.  Left ventricular diastolic parameters  are consistent with Grade I diastolic dysfunction (impaired relaxation).   2. Right ventricular systolic function is normal. The right ventricular  size is normal. There is normal pulmonary artery systolic pressure.   3. Left atrial size was mildly dilated.   4. The mitral valve is grossly normal. Trivial mitral valve  regurgitation.   5. The aortic valve is tricuspid. Aortic valve regurgitation is not  visualized.   6. The inferior vena cava is normal in size with greater than 50%  respiratory variability, suggesting right atrial pressure of 3 mmHg.  1. Left ventricular ejection fraction, by estimation, is 60 to 65%. The  left ventricle has normal function. The left ventricle has no regional  wall motion abnormalities. There is mild left ventricular hypertrophy.  Left ventricular diastolic parameters  are consistent with Grade I diastolic dysfunction (impaired relaxation).   2. Right ventricular systolic function is normal. The right  ventricular  size is normal. There is normal pulmonary artery systolic pressure.   3. Left atrial size was mildly dilated.   4. The mitral valve is grossly normal. Trivial mitral valve  regurgitation.   5. The aortic valve is tricuspid. Aortic valve regurgitation is  not  visualized.   6. The inferior vena cava is normal in size with greater than 50%  respiratory variability, suggesting right atrial pressure of 3 mmHg.   [facility] 01/2020  The left ventricular systolic function is normal.   Tortuous but otherwise normal coronary arteries without obstructive disease.   Normal LV function with left ventricular hypertrophy; LVEDP 13 mm   Normal right heart pressures.   RECOMMENDATION: Suspect nonischemic chest pain.  Recommend 2D echo Doppler to quantify extent of LVH and potential for diastolic dysfunction contributing to the patient's exertional dyspnea.  Patient will follow up with Dr. Dorris Carnes.   Recent Labs: 05/06/2022: ALT 18; BUN 12; Creatinine, Ser 0.48; Hemoglobin 13.4; Platelets 215; Potassium 3.2; Sodium 140  Recent Lipid Panel    Component Value Date/Time   CHOL 170 01/15/2022 0906   TRIG 220 (H) 01/15/2022 0906   HDL 47 01/15/2022 0906   CHOLHDL 3.6 01/15/2022 0906   CHOLHDL 4.5 08/20/2013 0741   VLDL 59 (H) 08/20/2013 0741   LDLCALC 86 01/15/2022 0906     Risk Assessment/Calculations:       Physical Exam:    VS:  BP 138/82   Pulse 78   Ht '5\' 2"'$  (1.575 m)   Wt 170 lb (77.1 kg)   SpO2 96%   BMI 31.09 kg/m     Wt Readings from Last 3 Encounters:  05/12/22 170 lb (77.1 kg)  05/06/22 165 lb (74.8 kg)  03/30/22 170 lb (77.1 kg)     GEN:  Well nourished, well developed in no acute distress HEENT: Normal NECK: No JVD; No carotid bruits CARDIAC: RRR, no murmurs, rubs, gallops RESPIRATORY:  Clear to auscultation without rales, wheezing or rhonchi  ABDOMEN: Soft, non-tender, non-distended MUSCULOSKELETAL:  No edema; No deformity. 2+ pedal pulses, equal  bilaterally SKIN: Warm and dry NEUROLOGIC:  Alert and oriented x 3 PSYCHIATRIC:  Normal affect   EKG:  EKG is not ordered today. EKG from [facility] ED revealed  NSR at 66 bpm, reviewed with patient   Diagnoses:    1. Hypertension, unspecified type   2. Hypertriglyceridemia   3. Aortic atherosclerosis ([place])   4. Hyperlipidemia LDL goal <70   5. Chest pain of uncertain etiology    Assessment and Plan:     Chest pain: Occasional episodes of chest pain at rest and with exertion. Recent ED visit unremarkable for ACS. Cardiac catheterization 01/2020 revealed torturous but otherwise normal coronary arteries without obstructive disease.  BP is overall well controlled. We discussed the addition of a long-acting nitrate however due to potential side effects she would like to monitor frequency of chest pain since symptoms are not persistent at this time.  Advised her to notify us if symptoms worsen prior to next office visit.  Aortic atherosclerosis/Hyperlipidemia/Hypertriglyceridemia: Aortic atherosclerosis on imaging 05/2022.  LDL 86 on triglycerides 220 on 01/15/22.  We will add Zetia 10 mg daily and recheck lipid and liver panel in 2-3 months.  Chronic HFpEF: LVEF 60-65%, mild LVH and diastolic dysfunction by echo 02/2020.  BP is well controlled.  Appears euvolemic on exam.  She denies dyspnea, edema, orthopnea, PND.  No medication changes today.  Hypertension: BP is well-controlled.      Disposition: 6 months with Dr. Harrington Challenger  Medication Adjustments/Labs and Tests Ordered: Current medicines are reviewed at length with the patient today.  Concerns regarding medicines are outlined above.  Orders Placed This Encounter  Procedures   Lipid panel   Hepatic function panel  Meds ordered this encounter  Medications   ezetimibe (ZETIA) 10 MG tablet    Sig: Take 1 tablet (10 mg total) by mouth daily.    Dispense:  90 tablet    Refill:  3    Patient Instructions  Medication Instructions:  Your  physician has recommended you make the following change in your medication:   START Zetia 10 mg taking 1 daily   *If you need a refill on your cardiac medications before your next appointment, please call your pharmacy*   Lab Work: 3 MONTHS:  FASTING LIPID & LFT  If you have labs (blood work) drawn today and your tests are completely normal, you will receive your results only by: [place] (if you have MyChart) OR A paper copy in the mail If you have any lab test that is abnormal or we need to change your treatment, we will call you to review the results.   Testing/Procedures: None ordered   Follow-Up: At [facility], you and your health needs are our priority.  As part of our continuing mission to provide you with exceptional heart care, we have created designated Provider Care Teams.  These Care Teams include your primary Cardiologist (physician) and Advanced Practice Providers (APPs -  Physician Assistants and Nurse Practitioners) who all work together to provide you with the care you need, when you need it.  We recommend signing up for the patient portal called "MyChart".  Sign up information is provided on this After Visit Summary.  MyChart is used to connect with patients for Virtual Visits (Telemedicine).  Patients are able to view lab/test results, encounter notes, upcoming appointments, etc.  Non-urgent messages can be sent to your provider as well.   To learn more about what you can do with MyChart, go to [email or web address].    Your next appointment:   6 month(s)  The format for your next appointment:   In Person  Provider:   Dorris Carnes, MD     Other Instructions   Important Information About Sugar         Signed, Emmaline Life, NP  05/12/2022 5:25 PM    [place]

## 2022-05-12 ENCOUNTER — Encounter: Payer: Self-pay | Admitting: Nurse Practitioner

## 2022-05-12 ENCOUNTER — Ambulatory Visit: Payer: PPO | Admitting: Nurse Practitioner

## 2022-05-12 VITALS — BP 138/82 | HR 78 | Ht 62.0 in | Wt 170.0 lb

## 2022-05-12 DIAGNOSIS — I10 Essential (primary) hypertension: Secondary | ICD-10-CM

## 2022-05-12 DIAGNOSIS — E785 Hyperlipidemia, unspecified: Secondary | ICD-10-CM

## 2022-05-12 DIAGNOSIS — E781 Pure hyperglyceridemia: Secondary | ICD-10-CM | POA: Diagnosis not present

## 2022-05-12 DIAGNOSIS — R079 Chest pain, unspecified: Secondary | ICD-10-CM | POA: Diagnosis not present

## 2022-05-12 DIAGNOSIS — I700 Atherosclerosis of aorta: Secondary | ICD-10-CM | POA: Diagnosis not present

## 2022-05-12 MED ORDER — EZETIMIBE 10 MG PO TABS: 10.0000 mg | ORAL_TABLET | Freq: Every day | ORAL | 3 refills | Status: DC

## 2022-05-12 NOTE — Patient Instructions (Signed)
Medication Instructions:  Your physician has recommended you make the following change in your medication:   START Zetia 10 mg taking 1 daily   *If you need a refill on your cardiac medications before your next appointment, please call your pharmacy*   Lab Work: 3 MONTHS:  FASTING LIPID & LFT  If you have labs (blood work) drawn today and your tests are completely normal, you will receive your results only by: [place] (if you have MyChart) OR A paper copy in the mail If you have any lab test that is abnormal or we need to change your treatment, we will call you to review the results.   Testing/Procedures: None ordered   Follow-Up: At [facility], you and your health needs are our priority.  As part of our continuing mission to provide you with exceptional heart care, we have created designated Provider Care Teams.  These Care Teams include your primary Cardiologist (physician) and Advanced Practice Providers (APPs -  Physician Assistants and Nurse Practitioners) who all work together to provide you with the care you need, when you need it.  We recommend signing up for the patient portal called "MyChart".  Sign up information is provided on this After Visit Summary.  MyChart is used to connect with patients for Virtual Visits (Telemedicine).  Patients are able to view lab/test results, encounter notes, upcoming appointments, etc.  Non-urgent messages can be sent to your provider as well.   To learn more about what you can do with MyChart, go to [email or web address].    Your next appointment:   6 month(s)  The format for your next appointment:   In Person  Provider:   Dorris Carnes, MD     Other Instructions   Important Information About Sugar

## 2022-05-26 DIAGNOSIS — G4733 Obstructive sleep apnea (adult) (pediatric): Secondary | ICD-10-CM | POA: Diagnosis not present

## 2022-06-15 ENCOUNTER — Encounter: Payer: Self-pay | Admitting: Family Medicine

## 2022-06-15 ENCOUNTER — Ambulatory Visit (INDEPENDENT_AMBULATORY_CARE_PROVIDER_SITE_OTHER): Payer: PPO | Admitting: Family Medicine

## 2022-06-15 VITALS — BP 130/75 | HR 65 | Temp 97.9°F | Ht 62.0 in | Wt 170.0 lb

## 2022-06-15 DIAGNOSIS — I10 Essential (primary) hypertension: Secondary | ICD-10-CM

## 2022-06-15 DIAGNOSIS — Z79891 Long term (current) use of opiate analgesic: Secondary | ICD-10-CM

## 2022-06-15 DIAGNOSIS — E876 Hypokalemia: Secondary | ICD-10-CM

## 2022-06-15 MED ORDER — HYDROCODONE-ACETAMINOPHEN 10-325 MG PO TABS: ORAL_TABLET | ORAL | 0 refills | Status: DC

## 2022-06-15 MED ORDER — PRAVASTATIN SODIUM 20 MG PO TABS: 20.0000 mg | ORAL_TABLET | Freq: Every day | ORAL | 3 refills | Status: DC

## 2022-06-15 MED ORDER — HYDROCHLOROTHIAZIDE 25 MG PO TABS: 25.0000 mg | ORAL_TABLET | Freq: Every day | ORAL | 3 refills | Status: DC

## 2022-06-15 MED ORDER — AMLODIPINE BESYLATE 5 MG PO TABS: 5.0000 mg | ORAL_TABLET | Freq: Every day | ORAL | 1 refills | Status: DC

## 2022-06-15 MED ORDER — DULOXETINE HCL 60 MG PO CPEP: 60.0000 mg | ORAL_CAPSULE | Freq: Every day | ORAL | 3 refills | Status: DC

## 2022-06-15 MED ORDER — POTASSIUM CHLORIDE CRYS ER 20 MEQ PO TBCR
20.0000 meq | EXTENDED_RELEASE_TABLET | Freq: Every day | ORAL | 3 refills | Status: DC
Start: 2022-06-15 — End: 2023-03-17

## 2022-06-15 NOTE — Progress Notes (Signed)
Subjective:    Patient ID: Emily [name], female    DOB: Oct 28, 1951, 71 y.o.   MRN: [id number]  HPI  Patient here for follow up on pain medication.  This patient was seen today for chronic pain  The medication list was reviewed and updated.  Location of Pain for which the patient has been treated with regarding narcotics:.  Lumbar area  Onset of this pain: Present for years   -Compliance with medication: Tries to take the medicine as fused times as possible  - Number patient states they take daily: 1 or maybe 2  -when was the last dose patient took?  Yesterday  The patient was advised the importance of maintaining medication and not using illegal substances with these.  Here for refills and follow up  The patient was educated that we can provide 3 monthly scripts for their medication, it is their responsibility to follow the instructions.  Side effects or complications from medications: Denies side effects  Patient is aware that pain medications are meant to minimize the severity of the pain to allow their pain levels to improve to allow for better function. They are aware of that pain medications cannot totally remove their pain.  Due for UDT ( at least once per year) : February  Scale of 1 to 10 ( 1 is least 10 is most) Your pain level without the medicine: 8 Your pain level with medication 3  Scale 1 to 10 ( 1-helps very little, 10 helps very well) How well does your pain medication reduce your pain so you can function better through out the day?  7  Quality of the pain: Throbbing aching  Persistence of the pain: Present all the time  Modifying factors: Worse with activity and prolonged sitting      Patient is concerned that she forgets words when talking to someone.  Review of Systems     Objective:   Physical Exam General-in no acute distress Eyes-no discharge Lungs-respiratory rate normal, CTA CV-no murmurs,RRR Extremities skin warm dry no  edema Neuro grossly normal Behavior normal, alert        Assessment & Plan:  1. HTN (hypertension), benign Blood pressure good control check metabolic 7 - Basic Metabolic Panel  2. Encounter for long-term opiate analgesic use Pain medicines updated The patient was seen in followup for chronic pain. A review over at their current pain status was discussed. Drug registry was checked. Prescriptions were given.  Regular follow-up recommended. Discussion was held regarding the importance of compliance with medication as well as pain medication contract.  Patient was informed that medication may cause drowsiness and should not be combined  with other medications/alcohol or street drugs. If the patient feels medication is causing altered alertness then do not drive or operate dangerous equipment.  Should be noted that the patient appears to be meeting appropriate use of opioids and response.  Evidenced by improved function and decent pain control without significant side effects and no evidence of overt aberrancy issues.  Upon discussion with the patient today they understand that opioid therapy is optional and they feel that the pain has been refractory to reasonable conservative measures and is significant and affecting quality of life enough to warrant ongoing therapy and wishes to continue opioids.  Refills were provided.  - Basic Metabolic Panel  3. Hypokalemia Potassium low previous ER visit/hospitalization and recheck metabolic 7 to look at potassium - Basic Metabolic Panel Patient was in the hospital with intermittent chest discomfort  was felt not to be angina seen by cardiology they did not do echo no further testing necessary currently other than the metabolic 7

## 2022-06-16 LAB — BASIC METABOLIC PANEL
BUN/Creatinine Ratio: 13 (ref 12–28)
BUN: 9 mg/dL (ref 8–27)
CO2: 25 mmol/L (ref 20–29)
Calcium: 9.7 mg/dL (ref 8.7–10.3)
Chloride: 103 mmol/L (ref 96–106)
Creatinine, Ser: 0.71 mg/dL (ref 0.57–1.00)
Glucose: 106 mg/dL — ABNORMAL HIGH (ref 70–99)
Potassium: 4.2 mmol/L (ref 3.5–5.2)
Sodium: 141 mmol/L (ref 134–144)
eGFR: 92 mL/min/{1.73_m2} (ref >59)

## 2022-07-20 ENCOUNTER — Other Ambulatory Visit (HOSPITAL_COMMUNITY): Payer: Self-pay | Admitting: Family Medicine

## 2022-07-20 DIAGNOSIS — Z1231 Encounter for screening mammogram for malignant neoplasm of breast: Secondary | ICD-10-CM

## 2022-07-28 ENCOUNTER — Ambulatory Visit (HOSPITAL_COMMUNITY)
Admission: RE | Admit: 2022-07-28 | Discharge: 2022-07-28 | Disposition: A | Discharge status: Home / Self Care | Payer: PPO | Source: Ambulatory Visit | Attending: Family Medicine | Admitting: Family Medicine

## 2022-07-28 DIAGNOSIS — Z1231 Encounter for screening mammogram for malignant neoplasm of breast: Secondary | ICD-10-CM | POA: Diagnosis not present

## 2022-08-12 ENCOUNTER — Ambulatory Visit: Payer: PPO | Attending: Nurse Practitioner

## 2022-08-12 DIAGNOSIS — E781 Pure hyperglyceridemia: Secondary | ICD-10-CM

## 2022-08-12 DIAGNOSIS — I10 Essential (primary) hypertension: Secondary | ICD-10-CM | POA: Diagnosis not present

## 2022-08-12 LAB — HEPATIC FUNCTION PANEL
ALT: 20 IU/L (ref 0–32)
AST: 22 IU/L (ref 0–40)
Albumin: 4.6 g/dL (ref 3.9–4.9)
Alkaline Phosphatase: 83 IU/L (ref 44–121)
Bilirubin Total: 0.5 mg/dL (ref 0.0–1.2)
Bilirubin, Direct: 0.13 mg/dL (ref 0.00–0.40)
Total Protein: 7.4 g/dL (ref 6.0–8.5)

## 2022-08-12 LAB — LIPID PANEL
Chol/HDL Ratio: 3.5 ratio (ref 0.0–4.4)
Cholesterol, Total: 142 mg/dL (ref 100–199)
HDL: 41 mg/dL (ref >39)
LDL Chol Calc (NIH): 59 mg/dL (ref 0–99)
Triglycerides: 263 mg/dL — ABNORMAL HIGH (ref 0–149)
VLDL Cholesterol Cal: 42 mg/dL — ABNORMAL HIGH (ref 5–40)

## 2022-09-15 ENCOUNTER — Ambulatory Visit (INDEPENDENT_AMBULATORY_CARE_PROVIDER_SITE_OTHER): Payer: PPO | Admitting: Family Medicine

## 2022-09-15 ENCOUNTER — Encounter: Payer: Self-pay | Admitting: Family Medicine

## 2022-09-15 VITALS — BP 124/72 | Wt 169.2 lb

## 2022-09-15 DIAGNOSIS — M25542 Pain in joints of left hand: Secondary | ICD-10-CM

## 2022-09-15 DIAGNOSIS — M25541 Pain in joints of right hand: Secondary | ICD-10-CM

## 2022-09-15 DIAGNOSIS — I10 Essential (primary) hypertension: Secondary | ICD-10-CM | POA: Diagnosis not present

## 2022-09-15 DIAGNOSIS — M8580 Other specified disorders of bone density and structure, unspecified site: Secondary | ICD-10-CM

## 2022-09-15 DIAGNOSIS — M1711 Unilateral primary osteoarthritis, right knee: Secondary | ICD-10-CM

## 2022-09-15 DIAGNOSIS — R52 Pain, unspecified: Secondary | ICD-10-CM

## 2022-09-15 MED ORDER — HYDROCODONE-ACETAMINOPHEN 10-325 MG PO TABS: ORAL_TABLET | ORAL | 0 refills | Status: DC

## 2022-09-15 NOTE — Progress Notes (Signed)
Subjective:    Patient ID: Emily [name], female    DOB: 1951-07-20, 71 y.o.   MRN: [id number]  HPI Pt arrives for follow up. Pt states blood pressure has been fine. Pt states her husband is concerned due to her jerking at night.   This patient was seen today for chronic pain  The medication list was reviewed and updated.  Location of Pain for which the patient has been treated with regarding narcotics:   Onset of this pain:    -Compliance with medication: Hydrocodone 10-325 mg  - Number patient states they take daily: 2  -when was the last dose patient took? Last night  The patient was advised the importance of maintaining medication and not using illegal substances with these.  Here for refills and follow up  The patient was educated that we can provide 3 monthly scripts for their medication, it is their responsibility to follow the instructions.  Side effects or complications from medications: none  Patient is aware that pain medications are meant to minimize the severity of the pain to allow their pain levels to improve to allow for better function. They are aware of that pain medications cannot totally remove their pain.  Due for UDT ( at least once per year) : completed 01/2022  Scale of 1 to 10 ( 1 is least 10 is most) Your pain level without the medicine: 10 Your pain level with medication: 3  Scale 1 to 10 ( 1-helps very little, 10 helps very well) How well does your pain medication reduce your pain so you can function better through out the day? 10  Quality of the pain:   Persistence of the pain:   Modifying factors:        Review of Systems     Objective:   Physical Exam General-in no acute distress Eyes-no discharge Lungs-respiratory rate normal, CTA CV-no murmurs,RRR Extremities skin warm dry no edema Neuro grossly normal Behavior normal, alert  Significant arthralgia of both hands worse in the right hand than the left hand causing some  mild deformity as well patient relates the pain and discomfort is persistent with increased stiffness in the morning she has had previous rheumatoid work-up which was negative but this was several years ago      Assessment & Plan:  1. Arthralgia of hands, bilateral Rheumatoid work-up Warm water-soak hands-as needed Hold off on anti-inflammatories Orthopedic referral Patient states she will set up an appointment with Dr. Aline Brochure may need to have a localized injection but much of this is osteoarthritis which will just be part of what is going on - C-reactive protein - Sedimentation Rate - Rheumatoid factor - CK - CYCLIC CITRUL PEPTIDE ANTIBODY, IGG/IGA  2. HTN (hypertension), benign Blood pressure good control - C-reactive protein - Sedimentation Rate - Rheumatoid factor - CK - CYCLIC CITRUL PEPTIDE ANTIBODY, IGG/IGA  3. Encounter for pain management The patient was seen in followup for chronic pain. A review over at their current pain status was discussed. Drug registry was checked. Prescriptions were given.  Regular follow-up recommended. Discussion was held regarding the importance of compliance with medication as well as pain medication contract.  Patient was informed that medication may cause drowsiness and should not be combined  with other medications/alcohol or street drugs. If the patient feels medication is causing altered alertness then do not drive or operate dangerous equipment.  Should be noted that the patient appears to be meeting appropriate use of opioids and response.  Evidenced by improved function and decent pain control without significant side effects and no evidence of overt aberrancy issues.  Upon discussion with the patient today they understand that opioid therapy is optional and they feel that the pain has been refractory to reasonable conservative measures and is significant and affecting quality of life enough to warrant ongoing therapy and wishes to  continue opioids.  Refills were provided. Pain medication sent in Patient may take 1 or 2 daily  4. Primary osteoarthritis of right knee Patient does have osteopenia check bone density - DG Bone Density

## 2022-09-15 NOTE — Patient Instructions (Signed)

## 2022-09-17 LAB — CK: Total CK: 136 U/L (ref 32–182)

## 2022-09-17 LAB — C-REACTIVE PROTEIN: CRP: 2 mg/L (ref 0–10)

## 2022-09-17 LAB — CYCLIC CITRUL PEPTIDE ANTIBODY, IGG/IGA: Cyclic Citrullin Peptide Ab: <1 units (ref 0–19)

## 2022-09-17 LAB — SEDIMENTATION RATE: Sed Rate: 2 mm/hr (ref 0–40)

## 2022-09-17 LAB — RHEUMATOID FACTOR: Rheumatoid fact SerPl-aCnc: <10 IU/mL (ref <14.0)

## 2022-09-21 ENCOUNTER — Ambulatory Visit (HOSPITAL_COMMUNITY)
Admission: RE | Admit: 2022-09-21 | Discharge: 2022-09-21 | Disposition: A | Discharge status: Home / Self Care | Payer: PPO | Source: Ambulatory Visit | Attending: Family Medicine | Admitting: Family Medicine

## 2022-09-21 DIAGNOSIS — Z780 Asymptomatic menopausal state: Secondary | ICD-10-CM | POA: Insufficient documentation

## 2022-09-21 DIAGNOSIS — Z13820 Encounter for screening for osteoporosis: Secondary | ICD-10-CM | POA: Insufficient documentation

## 2022-09-21 DIAGNOSIS — M8580 Other specified disorders of bone density and structure, unspecified site: Secondary | ICD-10-CM | POA: Insufficient documentation

## 2022-10-07 ENCOUNTER — Encounter: Payer: Self-pay | Admitting: Emergency Medicine

## 2022-10-07 ENCOUNTER — Ambulatory Visit
Admission: EM | Admit: 2022-10-07 | Discharge: 2022-10-07 | Disposition: A | Discharge status: Home / Self Care | Payer: PPO | Attending: Family Medicine | Admitting: Family Medicine

## 2022-10-07 DIAGNOSIS — J069 Acute upper respiratory infection, unspecified: Secondary | ICD-10-CM | POA: Diagnosis not present

## 2022-10-07 DIAGNOSIS — U071 COVID-19: Secondary | ICD-10-CM | POA: Diagnosis not present

## 2022-10-07 MED ORDER — PROMETHAZINE-DM 6.25-15 MG/5ML PO SYRP: 5.0000 mL | ORAL_SOLUTION | Freq: Four times a day (QID) | ORAL | 0 refills | Status: DC | PRN

## 2022-10-07 NOTE — ED Triage Notes (Signed)
Bilateral ear pain, sore throat and nasal drainage, cough since yesterday.  States diarrhea started last night.

## 2022-10-07 NOTE — Discharge Instructions (Signed)
We have tested you today for COVID and flu and should have those results back tomorrow.  Someone will call if anything is positive and discuss antivirals with you if so.  In the meantime, continue taking over-the-counter cold and congestion medications, nasal sprays, sinus rinses, humidifiers and I have sent in a good cough syrup with a decongestant.

## 2022-10-07 NOTE — ED Provider Notes (Signed)
[facility]    CSN: [id number] Arrival date & time: 10/07/22  0370      History   Chief Complaint No chief complaint on file.   HPI Emily [name] is a 71 y.o. female.   Presenting today with 1 day history of bilateral ear pressure and popping, sore throat, nasal drainage, cough.  Also started late last night with some diarrhea.  Denies fever, chills, chest pain, shortness of breath, abdominal pain, nausea vomiting or diarrhea.  Consistently taking her antihistamine and nasal spray with no relief.   Past Medical History:  Diagnosis Date   Acid reflux    Anxiety    Arthritis    CHF (congestive heart failure) (HCC)    90's swelling body-no breathing problems at that time-no cardiac -in dr s Wolfgang [place] office [name] give lasix and potassium   Complication of anesthesia    History of being told she was allergic to something in 1980 no problems since   Depression    Fibromyalgia    Fibromyalgia 09/10/2019   History of blood transfusion reaction 1978   History of hiatal hernia    HNP (herniated nucleus pulposus)    Hypertension    Does not see a cardiologist no stress or echo   Joint pain    Nerve pain    right leg    Ruptured lumbar disc    Sleep apnea    Uses CPAP every night setting at 7 mm/Hg   Patient Active Problem List   Diagnosis Date Noted   Encounter for pain management 01/19/2022   Patellar clunk syndrome of right knee    Inflammation of sacroiliac joint ([place]) 06/04/2021   Encounter for long-term opiate analgesic use 04/22/2021   Sacroiliitis ([place]) 01/26/2021   Elevated blood-pressure reading, without diagnosis of hypertension 09/25/2020   Exertional dyspnea    Chest pain of uncertain etiology    Fibromyalgia 09/10/2019   S/P TKR (total knee replacement), right 12/29/17 01/10/2018   Primary osteoarthritis of right knee    GERD without esophagitis 03/28/2017   Obesity (BMI 30-39.9) 03/28/2017   Obesity 10/06/2016   Lumbar foraminal stenosis  06/04/2016   Carpal tunnel syndrome, bilateral 02/07/2014   S/P lumbar spinal fusion 02/07/2014   Obstructive sleep apnea 02/04/2014   Hand arthritis 10/15/2013   HTN (hypertension), benign 08/24/2013   Hypertriglyceridemia 08/24/2013   Iliotibial band tendonitis 02/08/2013   Trigger finger, acquired 02/08/2013   Chronic midline back pain 06/22/2011   HNP (herniated nucleus pulposus) 06/22/2011   HIP PAIN, RIGHT 12/15/2010   LUMBOSACRAL SPONDYLOSIS WITHOUT MYELOPATHY 12/15/2010   SPINAL STENOSIS, LUMBAR 12/15/2010   KNEE REPLACEMENT, HX OF 02/26/2010   DERANGEMENT MENISCUS 07/03/2009   KNEE, ARTHRITIS, DEGEN./OSTEO 05/22/2009   KNEE PAIN 05/22/2009   ANSERINE BURSITIS 05/22/2009   TRIGGER FINGER 09/23/2008    Past Surgical History:  Procedure Laterality Date   ablation of nerves in back s1 and lumbar  05/21/2021   [place] [facility] Dr Brien Few   BACK SURGERY     x3   BLADDER REPAIR     tear during during birth of child   CARPAL TUNNEL RELEASE Bilateral    CESAREAN SECTION     CHOLECYSTECTOMY     gallbladder disease; stayed sick   COLONOSCOPY N/A 04/28/2016   Procedure: COLONOSCOPY;  Surgeon: [name], MD;  [facility]: [facility];  Service: Endoscopy;  Laterality: N/A;   ESOPHAGOGASTRODUODENOSCOPY N/A 04/28/2016   Procedure: ESOPHAGOGASTRODUODENOSCOPY (EGD);  Surgeon: [name], MD;  [facility]: [facility];  Service: Endoscopy;  Laterality: N/A;  12:00   GALLBLADDER SURGERY     KNEE ARTHROSCOPY Right 06/16/2021   Procedure: ARTHROSCOPY KNEE WITH LIMITED DEBRIDEMENT;  Surgeon: [name], MD;  Location: [facility];  Service: Orthopedics;  Laterality: Right;   knee right (torn Cart)     REPLACEMENT TOTAL KNEE     left   RIGHT/LEFT HEART CATH AND CORONARY ANGIOGRAPHY N/A 01/23/2020   Procedure: RIGHT/LEFT HEART CATH AND CORONARY ANGIOGRAPHY;  Surgeon: [name], MD;  Location: [place] [facility];  Service: Cardiovascular;  Laterality: N/A;   ROTATOR CUFF  REPAIR Bilateral    right and left   SPINAL FUSION     TOTAL KNEE ARTHROPLASTY Right 12/29/2017   Procedure: TOTAL KNEE ARTHROPLASTY-right knee;  Surgeon: [name], MD;  Location: [facility];  Service: Orthopedics;  Laterality: Right;   TUBAL LIGATION     vein right leg removed     wrist ganglion cyst left      OB History   No obstetric history on file.      Home Medications    Prior to Admission medications   Medication Sig Start Date End Date Taking? Authorizing Provider  promethazine-dextromethorphan (PROMETHAZINE-DM) 6.25-15 MG/5ML syrup Take 5 mLs by mouth 4 (four) times daily as needed. 10/07/22  Yes Volney American, PA-C  acetaminophen (TYLENOL) 500 MG tablet Take 1,000 mg by mouth every 8 (eight) hours as needed for mild pain or moderate pain.    [provider]  amLODipine (NORVASC) 5 MG tablet Take 1 tablet (5 mg total) by mouth daily. 06/15/22   Kathyrn Drown, MD  DHA-Vitamin C-Lutein (EYE HEALTH FORMULA PO) Take 1 tablet by mouth daily.    [provider]  diazepam (VALIUM) 5 MG tablet Take 5-10 mg by mouth every 6 (six) hours as needed (nerve pain).    [provider]  diphenhydrAMINE (BENADRYL ALLERGY) 25 MG tablet Take 1 tablet (25 mg total) by mouth every 4 (four) hours. Take with hydrocodone 12/31/17   [name], MD  DULoxetine (CYMBALTA) 60 MG capsule Take 1 capsule (60 mg total) by mouth daily. 06/15/22   Kathyrn Drown, MD  ezetimibe (ZETIA) 10 MG tablet Take 1 tablet (10 mg total) by mouth daily. 05/12/22   Swinyer, Lanice Schwab, NP  fexofenadine (ALLEGRA) 180 MG tablet Take 180 mg by mouth daily.    [provider]  fluticasone (FLONASE) 50 MCG/ACT nasal spray Place 1 spray into both nostrils daily. 08/18/21   Lamptey, Myrene Galas, MD  hydrochlorothiazide (HYDRODIURIL) 25 MG tablet Take 1 tablet (25 mg total) by mouth daily. 06/15/22   Kathyrn Drown, MD  HYDROcodone-acetaminophen [facility]) 10-325 MG tablet May take  twice a day prn pain caution drowsiness 09/15/22   Kathyrn Drown, MD  HYDROcodone-acetaminophen [facility]) 10-325 MG tablet May take twice a day prn pain caution drowsiness 09/15/22   Kathyrn Drown, MD  HYDROcodone-acetaminophen [facility]) 10-325 MG tablet May take twice a day prn pain caution drowsiness 09/15/22   Luking, Elayne Snare, MD  potassium chloride SA (KLOR-CON M) 20 MEQ tablet Take 1 tablet (20 mEq total) by mouth daily. 06/15/22   Kathyrn Drown, MD  pravastatin (PRAVACHOL) 20 MG tablet Take 1 tablet (20 mg total) by mouth daily. 06/15/22   Kathyrn Drown, MD  Propylene Glycol (SYSTANE BALANCE) 0.6 % SOLN Place 1 drop into both eyes 2 (two) times daily.    [provider]    Family History Family History  Problem Relation Age of Onset   Hypertension Father    Cancer Father        liposarcoma   Heart disease Mother    Cancer Other        family history    Diabetes Other        family history    Coronary artery disease Other        family history    Arthritis Other        family history    Asthma Other    Diabetes Brother    Heart disease Brother    Coronary artery disease Brother 41       CABD    Social History Social History   Tobacco Use   Smoking status: Never   Smokeless tobacco: Never  Vaping Use   Vaping Use: Never used  Substance Use Topics   Alcohol use: No   Drug use: No     Allergies   Codeine, Oxycodone-acetaminophen, Sulfamethoxazole-trimethoprim, Celebrex [celecoxib], Hydrocodone-acetaminophen, Losartan, Lyrica [pregabalin], Mobic [meloxicam], and Tramadol   Review of Systems Review of Systems PER HPI  Physical Exam Triage Vital Signs ED Triage Vitals [10/07/22 0831]  Enc Vitals Group     BP 134/75     Pulse Rate 89     Resp 18     Temp 98.1 F (36.7 C)     Temp Source Oral     SpO2 96 %     Weight      Height      Head Circumference      Peak Flow      Pain Score 4     Pain Loc      Pain Edu?      Excl. in [place]?    No  data found.  Updated Vital Signs BP 134/75 (BP Location: Right Arm)   Pulse 89   Temp 98.1 F (36.7 C) (Oral)   Resp 18   SpO2 96%   Visual Acuity Right Eye Distance:   Left Eye Distance:   Bilateral Distance:    Right Eye Near:   Left Eye Near:    Bilateral Near:     Physical Exam Vitals and nursing note reviewed.  Constitutional:      Appearance: Normal appearance.  HENT:     Head: Atraumatic.     Right Ear: Tympanic membrane and external ear normal.     Left Ear: Tympanic membrane and external ear normal.     Nose: Rhinorrhea present.     Mouth/Throat:     Mouth: Mucous membranes are moist.     Pharynx: Posterior oropharyngeal erythema present.  Eyes:     Extraocular Movements: Extraocular movements intact.     Conjunctiva/sclera: Conjunctivae normal.  Cardiovascular:     Rate and Rhythm: Normal rate and regular rhythm.     Heart sounds: Normal heart sounds.  Pulmonary:     Effort: Pulmonary effort is normal.     Breath sounds: Normal breath sounds. No wheezing or rales.  Musculoskeletal:        General: Normal range of motion.     Cervical back: Normal range of motion and neck supple.  Skin:    General: Skin is warm and dry.  Neurological:     Mental Status: She is alert and oriented to person, place, and time.     Motor: No weakness.     Gait: Gait normal.  Psychiatric:  Mood and Affect: Mood normal.        Thought Content: Thought content normal.    UC Treatments / Results  Labs (all labs ordered are listed, but only abnormal results are displayed) Labs Reviewed  RESP PANEL BY RT-PCR (FLU A&B, COVID) [id number]    EKG   Radiology No results found.  Procedures Procedures (including critical care time)  Medications Ordered in UC Medications - No data to display  Initial Impression / Assessment and Plan / UC Course  I have reviewed the triage vital signs and the nursing notes.  Pertinent labs & imaging results that were available during  my care of the patient were reviewed by me and considered in my medical decision making (see chart for details).     Vitals and exam reassuring, suspicious for viral upper respiratory infection.  Respiratory panel pending, treat with Phenergan DM, supportive over-the-counter medications and home care.  Return for worsening symptoms. Final Clinical Impressions(s) / UC Diagnoses   Final diagnoses:  Viral URI with cough     Discharge Instructions      We have tested you today for COVID and flu and should have those results back tomorrow.  Someone will call if anything is positive and discuss antivirals with you if so.  In the meantime, continue taking over-the-counter cold and congestion medications, nasal sprays, sinus rinses, humidifiers and I have sent in a good cough syrup with a decongestant.    ED Prescriptions     Medication Sig Dispense Auth. Provider   promethazine-dextromethorphan (PROMETHAZINE-DM) 6.25-15 MG/5ML syrup Take 5 mLs by mouth 4 (four) times daily as needed. 100 mL Volney American, [place]      PDMP not reviewed this encounter.   Volney American, [place] 10/07/22 1439

## 2022-10-08 ENCOUNTER — Telehealth: Payer: Self-pay

## 2022-10-08 ENCOUNTER — Telehealth (HOSPITAL_COMMUNITY): Payer: Self-pay | Admitting: Emergency Medicine

## 2022-10-08 LAB — RESP PANEL BY RT-PCR (FLU A&B, COVID) ARPGX2
Influenza A by PCR: NEGATIVE
Influenza B by PCR: NEGATIVE
SARS Coronavirus 2 by RT PCR: POSITIVE — AB

## 2022-10-08 MED ORDER — MOLNUPIRAVIR EUA 200MG CAPSULE
4.0000 | ORAL_CAPSULE | Freq: Two times a day (BID) | ORAL | 0 refills | Status: AC
Start: 2022-10-08 — End: 2022-10-13

## 2022-10-08 MED ORDER — MOLNUPIRAVIR EUA 200MG CAPSULE: 4.0000 | ORAL_CAPSULE | Freq: Two times a day (BID) | ORAL | 0 refills | Status: DC

## 2022-10-08 NOTE — Telephone Encounter (Signed)
Patient called stating [facility] is not able to fill antiviral medication for covid due to not having any in stock, resent prescription to [facility], [place].

## 2022-10-21 NOTE — Progress Notes (Signed)
Cardiology Office Note   Date:  10/22/2022   ID:  Emily [name], DOB February 13, 1951, MRN [id number]  PCP:  Kathyrn Drown, MD  Cardiologist:   Dorris Carnes, MD    Pt presents for follow up of HTN    History of Present Illness: Emily [name] is a 71 y.o. female with a history ofHTN, HL, fibromyalgia, GERD  Pt also has a hx of SOB   SHe had L heart cath in Feb 2021  Normal coronary arteries (tortuous, no CAD)   LVEDP 13   Normal RH pressuress        Echo showed normal LVEF   Mild LVH  I saw the pt in clinic in 2022   [facility] was seen by Elwyn Reach in June 2023     When she saw Elwyn Reach she had some chest pressure    Since seen she says she feels OK  No CP  Breathing is OK   No dizziness  Current Meds  Medication Sig   acetaminophen (TYLENOL) 500 MG tablet Take 1,000 mg by mouth every 8 (eight) hours as needed for mild pain or moderate pain.   amLODipine (NORVASC) 5 MG tablet Take 1 tablet (5 mg total) by mouth daily.   DHA-Vitamin C-Lutein (EYE HEALTH FORMULA PO) Take 1 tablet by mouth daily.   diazepam (VALIUM) 5 MG tablet Take 5-10 mg by mouth every 6 (six) hours as needed (nerve pain).   diphenhydrAMINE (BENADRYL ALLERGY) 25 MG tablet Take 1 tablet (25 mg total) by mouth every 4 (four) hours. Take with hydrocodone   DULoxetine (CYMBALTA) 60 MG capsule Take 1 capsule (60 mg total) by mouth daily.   ezetimibe (ZETIA) 10 MG tablet Take 1 tablet (10 mg total) by mouth daily.   fexofenadine (ALLEGRA) 180 MG tablet Take 180 mg by mouth daily.   fluticasone (FLONASE) 50 MCG/ACT nasal spray Place 1 spray into both nostrils daily.   hydrochlorothiazide (HYDRODIURIL) 25 MG tablet Take 1 tablet (25 mg total) by mouth daily.   HYDROcodone-acetaminophen (NORCO) 10-325 MG tablet May take twice a day prn pain caution drowsiness   HYDROcodone-acetaminophen (NORCO) 10-325 MG tablet May take twice a day prn pain caution drowsiness   HYDROcodone-acetaminophen (NORCO) 10-325 MG tablet May take twice a  day prn pain caution drowsiness   potassium chloride SA (KLOR-CON M) 20 MEQ tablet Take 1 tablet (20 mEq total) by mouth daily.   pravastatin (PRAVACHOL) 20 MG tablet Take 1 tablet (20 mg total) by mouth daily.   promethazine-dextromethorphan (PROMETHAZINE-DM) 6.25-15 MG/5ML syrup Take 5 mLs by mouth 4 (four) times daily as needed.   Propylene Glycol (SYSTANE BALANCE) 0.6 % SOLN Place 1 drop into both eyes 2 (two) times daily.     Allergies:   Codeine, Oxycodone-acetaminophen, Sulfamethoxazole-trimethoprim, Celebrex [celecoxib], Hydrocodone-acetaminophen, Losartan, Lyrica [pregabalin], Mobic [meloxicam], and Tramadol   Past Medical History:  Diagnosis Date   Acid reflux    Anxiety    Arthritis    CHF (congestive heart failure) (HCC)    90's swelling body-no breathing problems at that time-no cardiac -in Emily s Wolf[facility] [name] office Emily [name] give lasix and potassium   Complication of anesthesia    History of being told she was allergic to something in 1980 no problems since   Depression    Fibromyalgia    Fibromyalgia 09/10/2019   History of blood transfusion reaction 1978   History of hiatal hernia    HNP (herniated nucleus pulposus)    Hypertension  Does not see a cardiologist no stress or echo   Joint pain    Nerve pain    right leg    Ruptured lumbar disc    Sleep apnea    Uses CPAP every night setting at 7 mm/Hg    Past Surgical History:  Procedure Laterality Date   ablation of nerves in back s1 and lumbar  05/21/2021   [place] nuero Emily [facility]   BACK SURGERY     x3   BLADDER REPAIR     tear during during birth of child   CARPAL TUNNEL RELEASE Bilateral    CESAREAN SECTION     CHOLECYSTECTOMY     gallbladder disease; stayed sick   COLONOSCOPY N/A 04/28/2016   Procedure: COLONOSCOPY;  Surgeon: Emily Houston, MD;  [facility];  Service: Endoscopy;  Laterality: N/A;   ESOPHAGOGASTRODUODENOSCOPY N/A 04/28/2016   Procedure: ESOPHAGOGASTRODUODENOSCOPY (EGD);   Surgeon: Emily Houston, MD;  [facility];  Service: Endoscopy;  Laterality: N/A;  12:00   GALLBLADDER SURGERY     KNEE ARTHROSCOPY Right 06/16/2021   Procedure: ARTHROSCOPY KNEE WITH LIMITED DEBRIDEMENT;  Surgeon: Emily Civil, MD;  Location: [facility];  Service: Orthopedics;  Laterality: Right;   knee right (torn Cart)     REPLACEMENT TOTAL KNEE     left   RIGHT/LEFT HEART CATH AND CORONARY ANGIOGRAPHY N/A 01/23/2020   Procedure: RIGHT/LEFT HEART CATH AND CORONARY ANGIOGRAPHY;  Surgeon: Emily Sine, MD;  Location: [facility];  Service: Cardiovascular;  Laterality: N/A;   ROTATOR CUFF REPAIR Bilateral    right and left   SPINAL FUSION     TOTAL KNEE ARTHROPLASTY Right 12/29/2017   Procedure: TOTAL KNEE ARTHROPLASTY-right knee;  Surgeon: Emily Civil, MD;  Location: [facility];  Service: Orthopedics;  Laterality: Right;   TUBAL LIGATION     vein right leg removed     wrist ganglion cyst left       Social History:  The patient  reports that she has never smoked. She has never used smokeless tobacco. She reports that she does not drink alcohol and does not use drugs.   Family History:  The patient's family history includes Arthritis in an other family member; Asthma in an other family member; Cancer in her father and another family member; Coronary artery disease in an other family member; Coronary artery disease (age of onset: 66) in her brother; Diabetes in her brother and another family member; Heart disease in her brother and mother; Hypertension in her father.    ROS:  Please see the history of present illness. All other systems are reviewed and  Negative to the above problem except as noted.    PHYSICAL EXAM: VS:  BP 124/72   Pulse 66   Ht '5\' 2"'$  (1.575 m)   Wt 169 lb (76.7 kg)   SpO2 97%   BMI 30.91 kg/m   GEN: Obese 71 yo in no acute distress  HEENT: normal  Neck: no JVD, carotid bruits, Cardiac: RRR; no murmurs,  Tr LE  edema  Respiratory:   clear to auscultation bilaterally,  GI: soft, nontender, nondistended, + BS  No hepatomegaly  MS: no deformity Moving all extremities   Skin: warm and dry, no rash Neuro:  Strength and sensation are intact Psych: euthymic mood, full affect   EKG:  EKG is not ordered today.  Carotid USN   AUg 2021  IMPRESSION: Color duplex indicates minimal homogeneous plaque, with  no hemodynamically significant stenosis by duplex criteria in the extracranial cerebrovascular circulation.    Echo   March 2021  1. Left ventricular ejection fraction, by estimation, is 60 to 65%. The left ventricle has normal function. The left ventricle has no regional wall motion abnormalities. There is mild left ventricular hypertrophy. Left ventricular diastolic parameters are consistent with Grade I diastolic dysfunction (impaired relaxation). 2. Right ventricular systolic function is normal. The right ventricular size is normal. There is normal pulmonary artery systolic pressure. 3. Left atrial size was mildly dilated. 4. The mitral valve is grossly normal. Trivial mitral valve regurgitation. 5. The aortic valve is tricuspid. Aortic valve regurgitation is not visualized. 6. The inferior vena cava is normal in size with greater than 50% respiratory variability, suggesting right atrial pressure of 3 mmHg.  R and L heart cath  Feb 2021    The left ventricular systolic function is normal.   Tortuous but otherwise normal coronary arteries without obstructive disease.   Normal LV function with left ventricular hypertrophy; LVEDP 13 mm   Normal right heart pressures.   RECOMMENDATION: Suspect nonischemic chest pain.  Recommend 2D echo Doppler to quantify extent of LVH and potential for diastolic dysfunction contributing to the patient's exertional dyspnea.  Patient will follow up with Emily. Dorris Carnes.   Lipid Panel    Component Value Date/Time   CHOL 142 08/12/2022 0821   TRIG 263 (H) 08/12/2022 0821   HDL  41 08/12/2022 0821   CHOLHDL 3.5 08/12/2022 0821   CHOLHDL 4.5 08/20/2013 0741   VLDL 59 (H) 08/20/2013 0741   LDLCALC 59 08/12/2022 0821      Wt Readings from Last 3 Encounters:  10/22/22 169 lb (76.7 kg)  09/15/22 169 lb 3.2 oz (76.7 kg)  06/15/22 170 lb (77.1 kg)      ASSESSMENT AND PLAN:'   1  Hx chest pressue  Pt denies  Normal cath in past.    2  Dyspnea.   Pt currently says breathing is OK  3  Atherosclerosis of aorta   Rx risk factors    4LIpids   Zetia added to regimen   Last lipids in September   LDL 55   HDL 32   Trig 263    Watch diet   Stay active   3  HTN   Adequate control of lipids       Current medicines are reviewed at length with the patient today.  The patient does not have concerns regarding medicines.  Signed, Dorris Carnes, MD  10/22/2022 11:03 PM    [address], [place], [place]  [address] Phone: [phone]; Fax: [phone]  Cardiology Office Note   Date:  10/22/2022   ID:  KASIE LECCESE, DOB 06-11-1951, MRN [id number]  PCP:  Kathyrn Drown, MD  Cardiologist:   Dorris Carnes, MD   Pt referred for CP    History of Present Illness: Emily [name] is a 71 y.o. female with a history of CP  She was in church on 01/13/20   Developed pain 5/10 in severity   Started posterior(back) and up to front of chest then to L axilla and L breast.  Some transient SOB and N/  Had a spell one week prior     Since ED  visity she had a spell on Monday that lasted a few hours   4/10 in intensity   Frontal pain .   Yesterday she had some discomfort for about 1 hour.  Took tylenol  The pt says over the past 8 months she has noticed incresased DOE   What she could do with walking she has to stop now or slow down due to SOB    FHx signif for mother who died of MI and brother who has stents  When I saw the pt in Feb 2021     She underwent cath right after   This showe tortuous but normal coronary arteries   LVEDP 13  R sided pressure normal   D Dimger negative   Echo  Last visit was March 2021    Patient says she was doing OK   Had knee surgery   A month after had episode of HR increase to 100s    Lasted about 10 to 12 hours   Felt stressed  No dizziness  Went away    None since   This was 2 wks ago     Now does some walking   [place]   Has back prolbems     Current Meds  Medication Sig   acetaminophen (TYLENOL) 500 MG tablet Take 1,000 mg by mouth every 8 (eight) hours as needed for mild pain or moderate pain.   amLODipine (NORVASC) 5 MG tablet Take 1 tablet (5 mg total) by mouth daily.   DHA-Vitamin C-Lutein (EYE HEALTH FORMULA PO) Take 1 tablet by mouth daily.   diazepam (VALIUM) 5 MG tablet Take 5-10 mg by mouth every 6 (six) hours as needed (nerve pain).   diphenhydrAMINE (BENADRYL ALLERGY) 25 MG tablet Take 1 tablet (25 mg total) by mouth every 4 (four) hours. Take with hydrocodone   DULoxetine (CYMBALTA) 60 MG capsule Take 1 capsule (60 mg total) by mouth daily.   ezetimibe (ZETIA) 10 MG tablet Take 1 tablet (10 mg total) by mouth daily.   fexofenadine (ALLEGRA) 180 MG tablet Take 180 mg by mouth daily.   fluticasone (FLONASE) 50 MCG/ACT nasal spray Place 1 spray into both nostrils daily.   hydrochlorothiazide (HYDRODIURIL) 25 MG tablet Take 1 tablet (25 mg total) by mouth daily.   HYDROcodone-acetaminophen (NORCO) 10-325 MG tablet May take twice a day prn pain caution drowsiness    HYDROcodone-acetaminophen (NORCO) 10-325 MG tablet May take twice a day prn pain caution drowsiness   HYDROcodone-acetaminophen (NORCO) 10-325 MG tablet May take twice a day prn pain caution drowsiness   potassium chloride SA (KLOR-CON M) 20 MEQ tablet Take 1 tablet (20 mEq total) by mouth daily.   pravastatin (PRAVACHOL) 20 MG tablet Take 1 tablet (20 mg total) by mouth daily.   promethazine-dextromethorphan (PROMETHAZINE-DM) 6.25-15 MG/5ML syrup Take 5 mLs by mouth 4 (four) times daily  as needed.   Propylene Glycol (SYSTANE BALANCE) 0.6 % SOLN Place 1 drop into both eyes 2 (two) times daily.     Allergies:   Codeine, Oxycodone-acetaminophen, Sulfamethoxazole-trimethoprim, Celebrex [celecoxib], Hydrocodone-acetaminophen, Losartan, Lyrica [pregabalin], Mobic [meloxicam], and Tramadol   Past Medical History:  Diagnosis Date   Acid reflux    Anxiety    Arthritis    CHF (congestive heart failure) (HCC)    90's swelling body-no breathing problems at that time-no cardiac -in Emily s [name] office Emily [name] give lasix and potassium   Complication of anesthesia    History of being told she was allergic to something in 1980 no problems since   Depression    Fibromyalgia    Fibromyalgia 09/10/2019   History of blood transfusion reaction 1978   History of hiatal hernia    HNP (herniated nucleus pulposus)    Hypertension    Does not see a cardiologist no stress or echo   Joint pain    Nerve pain    right leg    Ruptured lumbar disc    Sleep apnea    Uses CPAP every night setting at 7 mm/Hg    Past Surgical History:  Procedure Laterality Date   ablation of nerves in back s1 and lumbar  05/21/2021    nuero Emily [facility]   BACK SURGERY     x3   BLADDER REPAIR     tear during during birth of child   CARPAL TUNNEL RELEASE Bilateral    CESAREAN SECTION     CHOLECYSTECTOMY     gallbladder disease; stayed sick   COLONOSCOPY N/A 04/28/2016   Procedure: COLONOSCOPY;  Surgeon: Emily Houston, MD;  [facility];  Service: Endoscopy;  Laterality: N/A;   ESOPHAGOGASTRODUODENOSCOPY N/A 04/28/2016   Procedure: ESOPHAGOGASTRODUODENOSCOPY (EGD);  Surgeon: Emily Houston, MD;  [facility];  Service: Endoscopy;  Laterality: N/A;  12:00   GALLBLADDER SURGERY     KNEE ARTHROSCOPY Right 06/16/2021   Procedure: ARTHROSCOPY KNEE WITH LIMITED DEBRIDEMENT;  Surgeon: Emily Civil, MD;  [facility];  Service: Orthopedics;  Laterality: Right;   knee right (torn Cart)     REPLACEMENT TOTAL KNEE     left   RIGHT/LEFT HEART CATH AND CORONARY ANGIOGRAPHY N/A 01/23/2020   Procedure: RIGHT/LEFT HEART CATH AND CORONARY ANGIOGRAPHY;  Surgeon: Emily Sine, MD;  Location: [facility];  Service: Cardiovascular;  Laterality: N/A;   ROTATOR CUFF REPAIR Bilateral    right and left   SPINAL FUSION     TOTAL KNEE ARTHROPLASTY Right 12/29/2017   Procedure: TOTAL KNEE ARTHROPLASTY-right knee;  Surgeon: Emily Civil, MD;  Location[facility];  Service: Orthopedics;  Laterality: Right;   TUBAL LIGATION     vein right leg removed     wrist ganglion cyst left       Social History:  The patient  reports that she has never smoked. She has never used smokeless tobacco. She reports that she does not drink alcohol and does not use drugs.   Family History:  The patient's family history includes Arthritis in an other family member; Asthma in an other family member; Cancer in her father and another family member; Coronary artery disease in an other family member; Coronary artery disease (age of onset: 51) in her brother; Diabetes in her brother and another family member; Heart disease in her brother and mother; Hypertension in her father.    ROS:  Please see the  history of present illness. All other systems are reviewed and  Negative to the above problem except as noted.    PHYSICAL EXAM: VS:  BP 124/72   Pulse 66   Ht '5\' 2"'$  (1.575 m)   Wt 169 lb (76.7 kg)    SpO2 97%   BMI 30.91 kg/m   GEN: OBese 71 yo in no acute distress  HEENT: normal  Neck: no JVD, carotid bruits, Cardiac: RRR; no murmurs NO LE edema  Respiratory:  clear to auscultation bilaterally GI: soft, nontender, nondistended, + BS  No hepatomegaly  MS: no deformity Moving all extremities   Skin: warm and dry, no rash Neuro:  Strength and sensation are intact Psych: euthymic mood, full affect   EKG:  EKG is not  ordered today.   Lipid Panel    Component Value Date/Time   CHOL 142 08/12/2022 0821   TRIG 263 (H) 08/12/2022 0821   HDL 41 08/12/2022 0821   CHOLHDL 3.5 08/12/2022 0821   CHOLHDL 4.5 08/20/2013 0741   VLDL 59 (H) 08/20/2013 0741   LDLCALC 59 08/12/2022 0821      Wt Readings from Last 3 Encounters:  10/22/22 169 lb (76.7 kg)  09/15/22 169 lb 3.2 oz (76.7 kg)  06/15/22 170 lb (77.1 kg)      ASSESSMENT AND PLAN:  1 Heart rate   Pt with short burst of elevated HR Limited to about 12 hours  None since  Not hemodyanmically destabilizing   HR not too high Follow   No testing for now unless it recurs becomes a problem Stay hydrated  2  Hx CP   Cath in Feb 2021  tortuous coronary arteries in 2021  No CAD  LVEDP 13  R sided pressures normal    2  HTN   BP is controlled     3  HL  LDL 55  HDL 32   Follow     F/U if recurs otherwise in 12 months  .     Current medicines are reviewed at length with the patient today.  The patient does not have concerns regarding medicines.  Signed, Dorris Carnes, MD  10/22/2022 11:03 PM    [address], [place], [place]  [address] Phone: [phone]; Fax: [phone]

## 2022-10-22 ENCOUNTER — Encounter: Payer: Self-pay | Admitting: Internal Medicine

## 2022-10-22 ENCOUNTER — Ambulatory Visit: Payer: PPO | Attending: Internal Medicine | Admitting: Internal Medicine

## 2022-10-22 VITALS — BP 124/72 | HR 66 | Ht 62.0 in | Wt 169.0 lb

## 2022-10-22 DIAGNOSIS — R0789 Other chest pain: Secondary | ICD-10-CM

## 2022-10-22 NOTE — Patient Instructions (Signed)
Medication Instructions:  Your physician recommends that you continue on your current medications as directed. Please refer to the Current Medication list given to you today.  *If you need a refill on your cardiac medications before your next appointment, please call your pharmacy*   Lab Work: NONE   If you have labs (blood work) drawn today and your tests are completely normal, you will receive your results only by: [place] (if you have MyChart) OR A paper copy in the mail If you have any lab test that is abnormal or we need to change your treatment, we will call you to review the results.   Testing/Procedures: NONE    Follow-Up: At [facility], you and your health needs are our priority.  As part of our continuing mission to provide you with exceptional heart care, we have created designated Provider Care Teams.  These Care Teams include your primary Cardiologist (physician) and Advanced Practice Providers (APPs -  Physician Assistants and Nurse Practitioners) who all work together to provide you with the care you need, when you need it.  We recommend signing up for the patient portal called "MyChart".  Sign up information is provided on this After Visit Summary.  MyChart is used to connect with patients for Virtual Visits (Telemedicine).  Patients are able to view lab/test results, encounter notes, upcoming appointments, etc.  Non-urgent messages can be sent to your provider as well.   To learn more about what you can do with MyChart, go to [email or web address].    Your next appointment:    Next Summer (July)   The format for your next appointment:   In Person  Provider:   Dorris Carnes, MD    Other Instructions Thank you for choosing [facility]!    Important Information About Sugar

## 2022-11-16 DIAGNOSIS — H353131 Nonexudative age-related macular degeneration, bilateral, early dry stage: Secondary | ICD-10-CM | POA: Diagnosis not present

## 2022-11-25 DIAGNOSIS — G4733 Obstructive sleep apnea (adult) (pediatric): Secondary | ICD-10-CM | POA: Diagnosis not present

## 2022-12-03 DIAGNOSIS — G4733 Obstructive sleep apnea (adult) (pediatric): Secondary | ICD-10-CM | POA: Diagnosis not present

## 2022-12-16 ENCOUNTER — Ambulatory Visit (INDEPENDENT_AMBULATORY_CARE_PROVIDER_SITE_OTHER): Payer: PPO | Admitting: Family Medicine

## 2022-12-16 ENCOUNTER — Encounter: Payer: Self-pay | Admitting: Family Medicine

## 2022-12-16 VITALS — BP 132/74 | Wt 171.2 lb

## 2022-12-16 DIAGNOSIS — Z79891 Long term (current) use of opiate analgesic: Secondary | ICD-10-CM | POA: Diagnosis not present

## 2022-12-16 DIAGNOSIS — E876 Hypokalemia: Secondary | ICD-10-CM | POA: Diagnosis not present

## 2022-12-16 DIAGNOSIS — E785 Hyperlipidemia, unspecified: Secondary | ICD-10-CM

## 2022-12-16 DIAGNOSIS — I700 Atherosclerosis of aorta: Secondary | ICD-10-CM | POA: Diagnosis not present

## 2022-12-16 DIAGNOSIS — I10 Essential (primary) hypertension: Secondary | ICD-10-CM | POA: Diagnosis not present

## 2022-12-16 DIAGNOSIS — Z79899 Other long term (current) drug therapy: Secondary | ICD-10-CM | POA: Diagnosis not present

## 2022-12-16 DIAGNOSIS — R52 Pain, unspecified: Secondary | ICD-10-CM

## 2022-12-16 MED ORDER — HYDROCODONE-ACETAMINOPHEN 10-325 MG PO TABS: ORAL_TABLET | ORAL | 0 refills | Status: DC

## 2022-12-16 MED ORDER — AMLODIPINE BESYLATE 5 MG PO TABS: 5.0000 mg | ORAL_TABLET | Freq: Every day | ORAL | 1 refills | Status: DC

## 2022-12-16 NOTE — Progress Notes (Signed)
Subjective:    Patient ID: Emily [name], female    DOB: 09/28/1951, 72 y.o.   MRN: [id number]  HPI This patient was seen today for chronic pain  The medication list was reviewed and updated.  Location of Pain for which the patient has been treated with regarding narcotics:   Onset of this pain:    -Compliance with medication: Hydrocodone 10-325 mg  - Number patient states they take daily: 2  -when was the last dose patient took? This morning   The patient was advised the importance of maintaining medication and not using illegal substances with these.  Here for refills and follow up  The patient was educated that we can provide 3 monthly scripts for their medication, it is their responsibility to follow the instructions.  Side effects or complications from medications: none  Patient is aware that pain medications are meant to minimize the severity of the pain to allow their pain levels to improve to allow for better function. They are aware of that pain medications cannot totally remove their pain.  Due for UDT ( at least once per year) : completed 01/2022  Scale of 1 to 10 ( 1 is least 10 is most) Your pain level without the medicine: up to a 10 Your pain level with medication: 4  Scale 1 to 10 ( 1-helps very little, 10 helps very well) How well does your pain medication reduce your pain so you can function better through out the day? 5  Quality of the pain: Patient relates aching pain in the neck she also relates throbbing in the lower back denies radiation down the leg  Persistence of the pain: Present all the time  Modifying factors: Worse with activity  Encounter for pain management  Encounter for long-term opiate analgesic use  HTN (hypertension), benign - Plan: Microalbumin/Creatinine Ratio, Urine, Basic metabolic panel, Lipid panel, Hepatic function panel  Hypokalemia - Plan: Microalbumin/Creatinine Ratio, Urine, Basic metabolic panel, Lipid panel,  Hepatic function panel  High risk medication use - Plan: Microalbumin/Creatinine Ratio, Urine, Basic metabolic panel, Lipid panel, Hepatic function panel  Hyperlipidemia, unspecified hyperlipidemia type - Plan: Microalbumin/Creatinine Ratio, Urine, Basic metabolic panel, Lipid panel, Hepatic function panel  Aortic atherosclerosis (HCC)  Has a history of hypokalemia and takes her medication regular basis Blood pressure issues takes her medicine states has been under good control Cholesterol does take statin no side effects.  Tolerating well     Review of Systems     Objective:   Physical Exam  General-in no acute distress Eyes-no discharge Lungs-respiratory rate normal, CTA CV-no murmurs,RRR Extremities skin warm dry no edema Neuro grossly normal Behavior normal, alert       Assessment & Plan:  1. Encounter for pain management The patient was seen in followup for chronic pain. A review over at their current pain status was discussed. Drug registry was checked. Prescriptions were given.  Regular follow-up recommended. Discussion was held regarding the importance of compliance with medication as well as pain medication contract.  Patient was informed that medication may cause drowsiness and should not be combined  with other medications/alcohol or street drugs. If the patient feels medication is causing altered alertness then do not drive or operate dangerous equipment.  Should be noted that the patient appears to be meeting appropriate use of opioids and response.  Evidenced by improved function and decent pain control without significant side effects and no evidence of overt aberrancy issues.  Upon discussion with the patient  today they understand that opioid therapy is optional and they feel that the pain has been refractory to reasonable conservative measures and is significant and affecting quality of life enough to warrant ongoing therapy and wishes to continue opioids.   Refills were provided.  Patient has had failed surgeries in terms of relieving her pain.  She has tried NSAIDs and acetaminophen physical therapy and injections without success.  Pain medicine allows her to function better.  Refills were given.  Patient under pain contract Does urine drug screen yearly  2. Encounter for long-term opiate analgesic use Please see discussion above most days she does 2/day on some days 3/day we will allow 70/month  3. HTN (hypertension), benign HTN- patient seen for follow-up regarding HTN.   Diet, medication compliance, appropriate labs and refills were completed.   Importance of keeping blood pressure under good control to lessen the risk of complications discussed Regular follow-up visits discussed  - Microalbumin/Creatinine Ratio, Urine - Basic metabolic panel - Lipid panel - Hepatic function panel  4. Hypokalemia Recheck metabolic 7 before follow-up visit continue potassium - Microalbumin/Creatinine Ratio, Urine - Basic metabolic panel - Lipid panel - Hepatic function panel  5. High risk medication use Check lab work before follow-up visit - Microalbumin/Creatinine Ratio, Urine - Basic metabolic panel - Lipid panel - Hepatic function panel  6. 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  This is helping her.  Gives her some socialization. Aortic atherosclerosis continue cholesterol treatments Follow-up 3 months labs before that visit

## 2023-01-26 ENCOUNTER — Telehealth: Payer: Self-pay | Admitting: Family Medicine

## 2023-01-26 ENCOUNTER — Encounter: Payer: Self-pay | Admitting: Family Medicine

## 2023-01-26 ENCOUNTER — Other Ambulatory Visit: Payer: Self-pay | Admitting: Family Medicine

## 2023-01-26 MED ORDER — HYDROCODONE-ACETAMINOPHEN 7.5-325 MG PO TABS: ORAL_TABLET | ORAL | 0 refills | Status: DC

## 2023-01-26 NOTE — Telephone Encounter (Signed)
Patient is currently on hydrocodone 10/325 but no pharmacy in this county has medication, She states called around to the different counties but can't find any. Please advise

## 2023-01-26 NOTE — Telephone Encounter (Signed)
[facility] in [place] stated they do have some of the 7.5/325mg in house and cancelled the script for the 10/325

## 2023-01-26 NOTE — Telephone Encounter (Signed)
7.5 was sent in, MyChart message sent to patient

## 2023-02-03 ENCOUNTER — Encounter: Payer: Self-pay | Admitting: Radiology

## 2023-02-11 ENCOUNTER — Ambulatory Visit (INDEPENDENT_AMBULATORY_CARE_PROVIDER_SITE_OTHER): Payer: PPO

## 2023-02-11 VITALS — Ht 62.0 in | Wt 171.0 lb

## 2023-02-11 DIAGNOSIS — Z0000 Encounter for general adult medical examination without abnormal findings: Secondary | ICD-10-CM

## 2023-02-11 NOTE — Progress Notes (Signed)
Subjective:   Emily [name] is a 72 y.o. female who presents for Medicare Annual (Subsequent) preventive examination.  I connected with  [name] on 02/11/23 by a audio enabled telemedicine application and verified that I am speaking with the correct person using two identifiers.  Patient Location: Home  Provider Location: Office/Clinic  I discussed the limitations of evaluation and management by telemedicine. The patient expressed understanding and agreed to proceed.  Review of Systems     Cardiac Risk Factors include: advanced age (>68mn, >>2women);dyslipidemia;hypertension;sedentary lifestyle     Objective:    Today's Vitals   02/11/23 0934 02/11/23 0935  Weight: 171 lb (77.6 kg)   Height: '5\' 2"'$  (1.575 m)   PainSc:  4    Body mass index is 31.28 kg/m.     02/11/2023    9:40 AM 05/06/2022   12:20 PM 02/02/2022    9:08 AM 06/16/2021    9:04 AM 06/12/2021   11:13 AM 09/03/2020   12:24 PM 01/23/2020    8:25 AM  Advanced Directives  Does Patient Have a Medical Advance Directive? No No No No No No Yes  Does patient want to make changes to medical advance directive?       No - Patient declined  Would patient like information on creating a medical advance directive? No - Patient declined No - Patient declined No - Patient declined No - Patient declined No - Patient declined No - Patient declined     Current Medications (verified) Outpatient Encounter Medications as of 02/11/2023  Medication Sig   acetaminophen (TYLENOL) 500 MG tablet Take 1,000 mg by mouth every 8 (eight) hours as needed for mild pain or moderate pain.   amLODipine (NORVASC) 5 MG tablet Take 1 tablet (5 mg total) by mouth daily.   DHA-Vitamin C-Lutein (EYE HEALTH FORMULA PO) Take 1 tablet by mouth daily.   diazepam (VALIUM) 5 MG tablet Take 5-10 mg by mouth every 6 (six) hours as needed (nerve pain).   diphenhydrAMINE (BENADRYL ALLERGY) 25 MG tablet Take 1 tablet (25 mg total) by mouth every 4 (four)  hours. Take with hydrocodone   DULoxetine (CYMBALTA) 60 MG capsule Take 1 capsule (60 mg total) by mouth daily.   ezetimibe (ZETIA) 10 MG tablet Take 1 tablet (10 mg total) by mouth daily.   fexofenadine (ALLEGRA) 180 MG tablet Take 180 mg by mouth daily.   fluticasone (FLONASE) 50 MCG/ACT nasal spray Place 1 spray into both nostrils daily.   hydrochlorothiazide (HYDRODIURIL) 25 MG tablet Take 1 tablet (25 mg total) by mouth daily.   HYDROcodone-acetaminophen (NORCO) 7.5-325 MG tablet One tid prn pain to last 30 day   naproxen sodium (ALEVE) 220 MG tablet Take 220 mg by mouth daily as needed.   potassium chloride SA (KLOR-CON M) 20 MEQ tablet Take 1 tablet (20 mEq total) by mouth daily.   pravastatin (PRAVACHOL) 20 MG tablet Take 1 tablet (20 mg total) by mouth daily.   promethazine-dextromethorphan (PROMETHAZINE-DM) 6.25-15 MG/5ML syrup Take 5 mLs by mouth 4 (four) times daily as needed.   Propylene Glycol (SYSTANE BALANCE) 0.6 % SOLN Place 1 drop into both eyes 2 (two) times daily.   No facility-administered encounter medications on file as of 02/11/2023.    Allergies (verified) Codeine, Oxycodone-acetaminophen, Sulfamethoxazole-trimethoprim, Celebrex [celecoxib], Hydrocodone-acetaminophen, Losartan, Lyrica [pregabalin], Mobic [meloxicam], and Tramadol   History: Past Medical History:  Diagnosis Date   Acid reflux    Anxiety    Arthritis  CHF (congestive heart failure) (HCC)    90's swelling body-no breathing problems at that time-no cardiac -in dr s [place] [place] office [name] give lasix and potassium   Complication of anesthesia    History of being told she was allergic to something in 1980 no problems since   Depression    Fibromyalgia    Fibromyalgia 09/10/2019   History of blood transfusion reaction 1978   History of hiatal hernia    HNP (herniated nucleus pulposus)    Hypertension    Does not see a cardiologist no stress or echo   Joint pain    Nerve pain    right leg     Ruptured lumbar disc    Sleep apnea    Uses CPAP every night setting at 7 mm/Hg   Past Surgical History:  Procedure Laterality Date   ablation of nerves in back s1 and lumbar  05/21/2021   [place] [facility] Dr Brien Few   BACK SURGERY     x3   BLADDER REPAIR     tear during during birth of child   CARPAL TUNNEL RELEASE Bilateral    CESAREAN SECTION     CHOLECYSTECTOMY     gallbladder disease; stayed sick   COLONOSCOPY N/A 04/28/2016   Procedure: COLONOSCOPY;  Surgeon: [name], MD;  Location: [facility];  Service: Endoscopy;  Laterality: N/A;   ESOPHAGOGASTRODUODENOSCOPY N/A 04/28/2016   Procedure: ESOPHAGOGASTRODUODENOSCOPY (EGD);  Surgeon: [name], MD;  Location: [facility];  Service: Endoscopy;  Laterality: N/A;  12:00   GALLBLADDER SURGERY     KNEE ARTHROSCOPY Right 06/16/2021   Procedure: ARTHROSCOPY KNEE WITH LIMITED DEBRIDEMENT;  Surgeon: [name], MD;  Location: [facility];  Service: Orthopedics;  Laterality: Right;   knee right (torn Cart)     REPLACEMENT TOTAL KNEE     left   RIGHT/LEFT HEART CATH AND CORONARY ANGIOGRAPHY N/A 01/23/2020   Procedure: RIGHT/LEFT HEART CATH AND CORONARY ANGIOGRAPHY;  Surgeon: [name], MD;  Location: [facility];  Service: Cardiovascular;  Laterality: N/A;   ROTATOR CUFF REPAIR Bilateral    right and left   SPINAL FUSION     TOTAL KNEE ARTHROPLASTY Right 12/29/2017   Procedure: TOTAL KNEE ARTHROPLASTY-right knee;  Surgeon: [name], MD;  Location: [facility];  Service: Orthopedics;  Laterality: Right;   TUBAL LIGATION     vein right leg removed     wrist ganglion cyst left     Family History  Problem Relation Age of Onset   Hypertension Father    Cancer Father        liposarcoma   Heart disease Mother    Cancer Other        family history    Diabetes Other        family history    Coronary artery disease Other        family history    Arthritis Other        family history    Asthma  Other    Diabetes Brother    Heart disease Brother    Coronary artery disease Brother 43       CABD   Social History   Socioeconomic History   Marital status: Married    Spouse name: Eddie Dibbles   Number of children: 2   Years of education: college    Highest education level: Not on file  Occupational History   Occupation: nurse   Tobacco Use  Smoking status: Never   Smokeless tobacco: Never  Vaping Use   Vaping Use: Never used  Substance and Sexual Activity   Alcohol use: No   Drug use: No   Sexual activity: Never    Birth control/protection: Post-menopausal  Other Topics Concern   Not on file  Social History Narrative   2 daughters, 1 deceased.   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    Social Determinants of Radio broadcast assistant Strain: Low Risk  (02/11/2023)   Overall Financial Resource Strain (CARDIA)    Difficulty of Paying Living Expenses: Not hard at all  Food Insecurity: No Food Insecurity (02/11/2023)   Hunger Vital Sign    Worried About Running Out of Food in the Last Year: Never true    Ran Out of Food in the Last Year: Never true  Transportation Needs: No Transportation Needs (02/11/2023)   PRAPARE - Hydrologist (Medical): No    Lack of Transportation (Non-Medical): No  Physical Activity: Sufficiently Active (02/11/2023)   Exercise Vital Sign    Days of Exercise per Week: 5 days    Minutes of Exercise per Session: 30 min  Stress: No Stress Concern Present (02/11/2023)   [place]    Feeling of Stress : Only a little  Social Connections: Socially Integrated (02/11/2023)   Social Connection and Isolation Panel [NHANES]    Frequency of Communication with Friends and Family: More than three times a week    Frequency of Social Gatherings with Friends and Family: More than three times a week    Attends Religious Services:  More than 4 times per year    Active Member of [employer] or Organizations: Yes    Attends Music therapist: More than 4 times per year    Marital Status: Married    Tobacco Counseling Counseling given: Not Answered   Clinical Intake:  Pre-visit preparation completed: Yes  Pain : 0-10 Pain Score: 4  Pain Type: Chronic pain Pain Location: Generalized Pain Descriptors / Indicators: Aching Pain Onset: More than a month ago  Diabetes: No  How often do you need to have someone help you when you read instructions, pamphlets, or other written materials from your doctor or pharmacy?: 1 - Never  Diabetic?No   Interpreter Needed?: No  Information entered by :: Denman George LPN   Activities of Daily Living    02/11/2023    9:40 AM  In your present state of health, do you have any difficulty performing the following activities:  Hearing? 0  Vision? 0  Difficulty concentrating or making decisions? 0  Walking or climbing stairs? 0  Dressing or bathing? 0  Doing errands, shopping? 0  Preparing Food and eating ? N  Using the Toilet? N  In the past six months, have you accidently leaked urine? N  Do you have problems with loss of bowel control? N  Managing your Medications? N  Managing your Finances? N  Housekeeping or managing your Housekeeping? N    Patient Care Team: Kathyrn Drown, MD as PCP - General (Family Medicine) Fay Records, MD as PCP - Cardiology (Cardiology) [facility] [place], [place], Jenny Reichmann, MD (Dermatology)  Indicate any recent Medical Services you may have received from other than [facility] providers in the past year (date may be approximate).     Assessment:  This is a routine wellness examination for Emily [name].  Hearing/Vision screen Hearing Screening - Comments:: Denies hearing difficulties   Vision Screening - Comments:: Wears rx glasses - up to date with routine eye exams with [facility] Ledell Noss   Dietary issues and exercise  activities discussed: Current Exercise Habits: Home exercise routine, Type of exercise: stretching, Time (Minutes): 30, Frequency (Times/Week): 5, Weekly Exercise (Minutes/Week): 150, Intensity: Mild   Goals Addressed             This Visit's Progress    Reduce pain         Depression Screen    02/11/2023    9:39 AM 12/16/2022    8:15 AM 02/02/2022    9:05 AM 08/19/2021   10:56 AM 04/22/2021    8:30 AM 01/28/2021   10:51 AM 10/22/2020   10:25 AM  PHQ 2/9 Scores  PHQ - 2 Score '2 2 1 '$ 0 0 5 4  PHQ- 9 Score '15 15    19 16    '$ Fall Risk    02/11/2023    9:36 AM 12/16/2022    8:14 AM 02/02/2022    9:08 AM 08/19/2021   10:56 AM 04/22/2021    8:30 AM  Fall Risk   Falls in the past year? 0 0 0 0 0  Number falls in past yr: 0 0 0 0 0  Injury with Fall? 0 0 0 0 0  Risk for fall due to : No Fall Risks No Fall Risks No Fall Risks No Fall Risks   Follow up Falls prevention discussed;Education provided;Falls evaluation completed Falls evaluation completed Falls prevention discussed Falls evaluation completed Falls evaluation completed    FALL RISK PREVENTION PERTAINING TO THE HOME:  Any stairs in or around the home? No  If so, are there any without handrails? No  Home free of loose throw rugs in walkways, pet beds, electrical cords, etc? Yes  Adequate lighting in your home to reduce risk of falls? Yes   ASSISTIVE DEVICES UTILIZED TO PREVENT FALLS:  Life alert? No  Use of a cane, walker or w/c? No  Grab bars in the bathroom? Yes  Shower chair or bench in shower? No  Elevated toilet seat or a handicapped toilet? Yes   TIMED UP AND GO:  Was the test performed? No . Telephonic visit   Cognitive Function:        02/11/2023    9:41 AM 02/02/2022    9:12 AM  6CIT Screen  What Year? 0 points 0 points  What month? 0 points 0 points  What time? 0 points 0 points  Count back from 20 0 points 0 points  Months in reverse 0 points 0 points  Repeat phrase 0 points 0 points  Total Score  0 points 0 points    Immunizations Immunization History  Administered Date(s) Administered   Fluad Quad(high Dose 65+) 10/22/2020, 10/26/2021, 11/14/2022   Hepatitis B 06/13/2003, 07/16/2003, 12/16/2003   Influenza Split 10/15/2013   Influenza,inj,Quad PF,6+ Mos 11/12/2014, 11/04/2015, 11/08/2016, 11/02/2017, 10/11/2018   Influenza-Unspecified 10/17/2019   Moderna Sars-Covid-2 Vaccination 02/05/2020, 03/04/2020, 11/10/2020   Pneumococcal Conjugate-13 10/27/2019   Pneumococcal Polysaccharide-23 12/30/2017   Td 01/03/2019   Zoster Recombinat (Shingrix) 11/14/2022, 02/10/2023    TDAP status: Up to date  Flu Vaccine status: Up to date  Pneumococcal vaccine status: Up to date  Covid-19 vaccine status: Information provided on how to obtain vaccines.   Qualifies for Shingles Vaccine? Yes   Zostavax completed No  Shingrix Completed?: Yes  Screening Tests Health Maintenance  Topic Date Due   COLONOSCOPY (Pts 45-57yr Insurance coverage will need to be confirmed)  04/28/2021   COVID-19 Vaccine (4 - 2023-24 season) 08/06/2022   Medicare Annual Wellness (AWV)  02/11/2024   MAMMOGRAM  07/28/2024   DEXA SCAN  09/21/2024   DTaP/Tdap/Td (2 - Tdap) 01/03/2029   Pneumonia Vaccine 72 Years old  Completed   INFLUENZA VACCINE  Completed   Hepatitis C Screening  Completed   Zoster Vaccines- Shingrix  Completed   HPV VACCINES  Aged Out    Health Maintenance  Health Maintenance Due  Topic Date Due   COLONOSCOPY (Pts 45-414yrInsurance coverage will need to be confirmed)  04/28/2021   COVID-19 Vaccine (4 - 2023-24 season) 08/06/2022    Colorectal cancer screening: Type of screening: Colonoscopy. Completed 04/28/16. Repeat every 5 years  Mammogram status: Completed 07/28/22. Repeat every year  Bone Density status: Completed 09/21/22. Results reflect: Bone density results: NORMAL. Repeat every 2 years.  Lung Cancer Screening: (Low Dose CT Chest recommended if Age 71-80ears, 30  pack-year currently smoking OR have quit w/in 15years.) does not qualify.   Lung Cancer Screening Referral: n/a  Additional Screening:  Hepatitis C Screening: does qualify; Completed 06/20/17  Vision Screening: Recommended annual ophthalmology exams for early detection of glaucoma and other disorders of the eye. Is the patient up to date with their annual eye exam?  Yes  Who is the provider or what is the name of the office in which the patient attends annual eye exams? [facility] EdLedell Nossf pt is not established with a provider, would they like to be referred to a provider to establish care? No .   Dental Screening: Recommended annual dental exams for proper oral hygiene  Community Resource Referral / Chronic Care Management: CRR required this visit?  No   CCM required this visit?  No      Plan:     I have personally reviewed and noted the following in the patient's chart:   Medical and social history Use of alcohol, tobacco or illicit drugs  Current medications and supplements including opioid prescriptions. Patient is currently taking opioid prescriptions. Information provided to patient regarding non-opioid alternatives. Patient advised to discuss non-opioid treatment plan with their provider. Functional ability and status Nutritional status Physical activity Advanced directives List of other physicians Hospitalizations, surgeries, and ER visits in previous 12 months Vitals Screenings to include cognitive, depression, and falls Referrals and appointments  In addition, I have reviewed and discussed with patient certain preventive protocols, quality metrics, and best practice recommendations. A written personalized care plan for preventive services as well as general preventive health recommendations were provided to patient.     SlVanetta MuldersLPWy[place] [id number] Due to this being a virtual visit, the after visit summary with patients personalized plan was offered  to patient via mail or my-chart.  Patient would like to access on my-chart  Nurse Notes: See telephone note with patient concerns

## 2023-02-11 NOTE — Patient Instructions (Addendum)
[name] , Thank you for taking time to come for your Medicare Wellness Visit. I appreciate your ongoing commitment to your health goals. Please review the following plan we discussed and let me know if I can assist you in the future.   These are the goals we discussed:  Goals      Exercise 3x per week (30 min per time)     Continue to exercise and eat healthy.     Reduce pain        This is a list of the screening recommended for you and due dates:  Health Maintenance  Topic Date Due   Colon Cancer Screening  04/28/2021   COVID-19 Vaccine (4 - 2023-24 season) 08/06/2022   Medicare Annual Wellness Visit  02/11/2024   Mammogram  07/28/2024   DEXA scan (bone density measurement)  09/21/2024   DTaP/Tdap/Td vaccine (2 - Tdap) 01/03/2029   Pneumonia Vaccine  Completed   Flu Shot  Completed   Hepatitis C Screening: USPSTF Recommendation to screen - Ages 18-79 yo.  Completed   Zoster (Shingles) Vaccine  Completed   HPV Vaccine  Aged Out    Advanced directives: Forms are available if you choose in the future to pursue completion.  This is recommended in order to make sure that your health wishes are honored in the event that you are unable to verbalize them to the provider.    Conditions/risks identified: Aim for 30 minutes of exercise or brisk walking, 6-8 glasses of water, and 5 servings of fruits and vegetables each day.   Next appointment: Follow up in one year for your annual wellness visit    Preventive Care 65 Years and Older, Female Preventive care refers to lifestyle choices and visits with your health care provider that can promote health and wellness. What does preventive care include? A yearly physical exam. This is also called an annual well check. Dental exams once or twice a year. Routine eye exams. Ask your health care provider how often you should have your eyes checked. Personal lifestyle choices, including: Daily care of your teeth and gums. Regular physical  activity. Eating a healthy diet. Avoiding tobacco and drug use. Limiting alcohol use. Practicing safe sex. Taking low-dose aspirin every day. Taking vitamin and mineral supplements as recommended by your health care provider. What happens during an annual well check? The services and screenings done by your health care provider during your annual well check will depend on your age, overall health, lifestyle risk factors, and family history of disease. Counseling  Your health care provider may ask you questions about your: Alcohol use. Tobacco use. Drug use. Emotional well-being. Home and relationship well-being. Sexual activity. Eating habits. History of falls. Memory and ability to understand (cognition). Work and work Statistician. Reproductive health. Screening  You may have the following tests or measurements: Height, weight, and BMI. Blood pressure. Lipid and cholesterol levels. These may be checked every 5 years, or more frequently if you are over 38 years old. Skin check. Lung cancer screening. You may have this screening every year starting at age 39 if you have a 30-pack-year history of smoking and currently smoke or have quit within the past 15 years. Fecal occult blood test (FOBT) of the stool. You may have this test every year starting at age 43. Flexible sigmoidoscopy or colonoscopy. You may have a sigmoidoscopy every 5 years or a colonoscopy every 10 years starting at age 49. Hepatitis C blood test. Hepatitis B blood test. Sexually  transmitted disease (STD) testing. Diabetes screening. This is done by checking your blood sugar (glucose) after you have not eaten for a while (fasting). You may have this done every 1-3 years. Bone density scan. This is done to screen for osteoporosis. You may have this done starting at age 76. Mammogram. This may be done every 1-2 years. Talk to your health care provider about how often you should have regular mammograms. Talk with your  health care provider about your test results, treatment options, and if necessary, the need for more tests. Vaccines  Your health care provider may recommend certain vaccines, such as: Influenza vaccine. This is recommended every year. Tetanus, diphtheria, and acellular pertussis (Tdap, Td) vaccine. You may need a Td booster every 10 years. Zoster vaccine. You may need this after age 40. Pneumococcal 13-valent conjugate (PCV13) vaccine. One dose is recommended after age 68. Pneumococcal polysaccharide (PPSV23) vaccine. One dose is recommended after age 63. Talk to your health care provider about which screenings and vaccines you need and how often you need them. This information is not intended to replace advice given to you by your health care provider. Make sure you discuss any questions you have with your health care provider. Document Released: 12/19/2015 Document Revised: 08/11/2016 Document Reviewed: 09/23/2015 Elsevier Interactive Patient Education  2017 [employer] Prevention in the Home Falls can cause injuries. They can happen to people of all ages. There are many things you can do to make your home safe and to help prevent falls. What can I do on the outside of my home? Regularly fix the edges of walkways and driveways and fix any cracks. Remove anything that might make you trip as you walk through a door, such as a raised step or threshold. Trim any bushes or trees on the path to your home. Use bright outdoor lighting. Clear any walking paths of anything that might make someone trip, such as rocks or tools. Regularly check to see if handrails are loose or broken. Make sure that both sides of any steps have handrails. Any raised decks and porches should have guardrails on the edges. Have any leaves, snow, or ice cleared regularly. Use sand or salt on walking paths during winter. Clean up any spills in your garage right away. This includes oil or grease spills. What can I  do in the bathroom? Use night lights. Install grab bars by the toilet and in the tub and shower. Do not use towel bars as grab bars. Use non-skid mats or decals in the tub or shower. If you need to sit down in the shower, use a plastic, non-slip stool. Keep the floor dry. Clean up any water that spills on the floor as soon as it happens. Remove soap buildup in the tub or shower regularly. Attach bath mats securely with double-sided non-slip rug tape. Do not have throw rugs and other things on the floor that can make you trip. What can I do in the bedroom? Use night lights. Make sure that you have a light by your bed that is easy to reach. Do not use any sheets or blankets that are too big for your bed. They should not hang down onto the floor. Have a firm chair that has side arms. You can use this for support while you get dressed. Do not have throw rugs and other things on the floor that can make you trip. What can I do in the kitchen? Clean up any spills right away. Avoid  walking on wet floors. Keep items that you use a lot in easy-to-reach places. If you need to reach something above you, use a strong step stool that has a grab bar. Keep electrical cords out of the way. Do not use floor polish or wax that makes floors slippery. If you must use wax, use non-skid floor wax. Do not have throw rugs and other things on the floor that can make you trip. What can I do with my stairs? Do not leave any items on the stairs. Make sure that there are handrails on both sides of the stairs and use them. Fix handrails that are broken or loose. Make sure that handrails are as long as the stairways. Check any carpeting to make sure that it is firmly attached to the stairs. Fix any carpet that is loose or worn. Avoid having throw rugs at the top or bottom of the stairs. If you do have throw rugs, attach them to the floor with carpet tape. Make sure that you have a light switch at the top of the stairs  and the bottom of the stairs. If you do not have them, ask someone to add them for you. What else can I do to help prevent falls? Wear shoes that: Do not have high heels. Have rubber bottoms. Are comfortable and fit you well. Are closed at the toe. Do not wear sandals. If you use a stepladder: Make sure that it is fully opened. Do not climb a closed stepladder. Make sure that both sides of the stepladder are locked into place. Ask someone to hold it for you, if possible. Clearly mark and make sure that you can see: Any grab bars or handrails. First and last steps. Where the edge of each step is. Use tools that help you move around (mobility aids) if they are needed. These include: Canes. Walkers. Scooters. Crutches. Turn on the lights when you go into a dark area. Replace any light bulbs as soon as they burn out. Set up your furniture so you have a clear path. Avoid moving your furniture around. If any of your floors are uneven, fix them. If there are any pets around you, be aware of where they are. Review your medicines with your doctor. Some medicines can make you feel dizzy. This can increase your chance of falling. Ask your doctor what other things that you can do to help prevent falls. This information is not intended to replace advice given to you by your health care provider. Make sure you discuss any questions you have with your health care provider. Document Released: 09/18/2009 Document Revised: 04/29/2016 Document Reviewed: 12/27/2014 Elsevier Interactive Patient Education  2017 [employer].

## 2023-03-01 ENCOUNTER — Telehealth: Payer: Self-pay | Admitting: Family Medicine

## 2023-03-01 ENCOUNTER — Other Ambulatory Visit: Payer: Self-pay | Admitting: Family Medicine

## 2023-03-01 MED ORDER — HYDROCODONE-ACETAMINOPHEN 7.5-325 MG PO TABS: ORAL_TABLET | ORAL | 0 refills | Status: DC

## 2023-03-01 NOTE — Telephone Encounter (Signed)
Medication was sent in as requested 

## 2023-03-01 NOTE — Telephone Encounter (Signed)
Refill on HYDROcodone-acetaminophen (NORCO) 7.5-325 MG tablet  called in [facility]

## 2023-03-02 NOTE — Telephone Encounter (Signed)
Left message to return call 

## 2023-03-07 ENCOUNTER — Encounter: Payer: Self-pay | Admitting: *Deleted

## 2023-03-08 DIAGNOSIS — Z79899 Other long term (current) drug therapy: Secondary | ICD-10-CM | POA: Diagnosis not present

## 2023-03-08 DIAGNOSIS — E876 Hypokalemia: Secondary | ICD-10-CM | POA: Diagnosis not present

## 2023-03-08 DIAGNOSIS — E785 Hyperlipidemia, unspecified: Secondary | ICD-10-CM | POA: Diagnosis not present

## 2023-03-08 DIAGNOSIS — I10 Essential (primary) hypertension: Secondary | ICD-10-CM | POA: Diagnosis not present

## 2023-03-09 LAB — HEPATIC FUNCTION PANEL
ALT: 19 IU/L (ref 0–32)
AST: 19 IU/L (ref 0–40)
Albumin: 4.4 g/dL (ref 3.9–4.9)
Alkaline Phosphatase: 79 IU/L (ref 44–121)
Bilirubin Total: 0.2 mg/dL (ref 0.0–1.2)
Bilirubin, Direct: <0.1 mg/dL (ref 0.00–0.40)
Total Protein: 6.8 g/dL (ref 6.0–8.5)

## 2023-03-09 LAB — BASIC METABOLIC PANEL
BUN/Creatinine Ratio: 20 (ref 12–28)
BUN: 13 mg/dL (ref 8–27)
CO2: 23 mmol/L (ref 20–29)
Calcium: 9.5 mg/dL (ref 8.7–10.3)
Chloride: 102 mmol/L (ref 96–106)
Creatinine, Ser: 0.65 mg/dL (ref 0.57–1.00)
Glucose: 102 mg/dL — ABNORMAL HIGH (ref 70–99)
Potassium: 3.9 mmol/L (ref 3.5–5.2)
Sodium: 141 mmol/L (ref 134–144)
eGFR: 95 mL/min/{1.73_m2} (ref >59)

## 2023-03-09 LAB — MICROALBUMIN / CREATININE URINE RATIO
Creatinine, Urine: 108.4 mg/dL
Microalb/Creat Ratio: 10 mg/g creat (ref 0–29)
Microalbumin, Urine: 10.3 ug/mL

## 2023-03-09 LAB — LIPID PANEL
Chol/HDL Ratio: 3.6 ratio (ref 0.0–4.4)
Cholesterol, Total: 139 mg/dL (ref 100–199)
HDL: 39 mg/dL — ABNORMAL LOW (ref >39)
LDL Chol Calc (NIH): 52 mg/dL (ref 0–99)
Triglycerides: 309 mg/dL — ABNORMAL HIGH (ref 0–149)
VLDL Cholesterol Cal: 48 mg/dL — ABNORMAL HIGH (ref 5–40)

## 2023-03-17 ENCOUNTER — Ambulatory Visit (INDEPENDENT_AMBULATORY_CARE_PROVIDER_SITE_OTHER): Payer: PPO | Admitting: Family Medicine

## 2023-03-17 VITALS — BP 123/78 | HR 69 | Ht 62.0 in | Wt 172.4 lb

## 2023-03-17 DIAGNOSIS — I10 Essential (primary) hypertension: Secondary | ICD-10-CM

## 2023-03-17 DIAGNOSIS — M25531 Pain in right wrist: Secondary | ICD-10-CM

## 2023-03-17 DIAGNOSIS — R52 Pain, unspecified: Secondary | ICD-10-CM

## 2023-03-17 MED ORDER — HYDROCHLOROTHIAZIDE 25 MG PO TABS: 25.0000 mg | ORAL_TABLET | Freq: Every day | ORAL | 3 refills | Status: DC

## 2023-03-17 MED ORDER — DULOXETINE HCL 60 MG PO CPEP: 60.0000 mg | ORAL_CAPSULE | Freq: Every day | ORAL | 3 refills | Status: DC

## 2023-03-17 MED ORDER — AMLODIPINE BESYLATE 5 MG PO TABS: 5.0000 mg | ORAL_TABLET | Freq: Every day | ORAL | 1 refills | Status: DC

## 2023-03-17 MED ORDER — HYDROCODONE-ACETAMINOPHEN 7.5-325 MG PO TABS: ORAL_TABLET | ORAL | 0 refills | Status: DC

## 2023-03-17 MED ORDER — EZETIMIBE 10 MG PO TABS: 10.0000 mg | ORAL_TABLET | Freq: Every day | ORAL | 3 refills | Status: DC

## 2023-03-17 MED ORDER — PRAVASTATIN SODIUM 20 MG PO TABS: 20.0000 mg | ORAL_TABLET | Freq: Every day | ORAL | 3 refills | Status: DC

## 2023-03-17 MED ORDER — POTASSIUM CHLORIDE CRYS ER 20 MEQ PO TBCR: 20.0000 meq | EXTENDED_RELEASE_TABLET | Freq: Every day | ORAL | 3 refills | Status: DC

## 2023-03-17 NOTE — Progress Notes (Signed)
Subjective:    Patient ID: Emily [name], female    DOB: 06-28-1951, 72 y.o.   MRN: [id number]  HPI This patient was seen today for chronic pain  The medication list was reviewed and updated.  Location of Pain for which the patient has been treated with regarding narcotics: Pain in the back radiates down the legs.  Also orthopedic pain in her knees hips and right wrist  Onset of this pain: Present for years   -Compliance with medication: Good compliance with medicine  - Number patient states they take daily: 2 tablets sometimes 3 tablets  -when was the last dose patient took? 10:00 pm last night  The patient was advised the importance of maintaining medication and not using illegal substances with these.  Here for refills and follow up  The patient was educated that we can provide 3 monthly scripts for their medication, it is their responsibility to follow the instructions.  Side effects or complications from medications: None  Patient is aware that pain medications are meant to minimize the severity of the pain to allow their pain levels to improve to allow for better function. They are aware of that pain medications cannot totally remove their pain.  Due for UDT ( at least once per year) : 01/19/2022  Scale of 1 to 10 ( 1 is least 10 is most) Your pain level without the medicine: 8 Your pain level with medication 5  Scale 1 to 10 ( 1-helps very little, 10 helps very well) How well does your pain medication reduce your pain so you can function better through out the day? 8  Quality of the pain: Throbbing aching  Persistence of the pain: Present all the time  Modifying factors: Worse with activity  Patient for blood pressure check up.  The patient does have hypertension.   Patient relates dietary measures try to minimize salt The importance of healthy diet and activity were discussed Patient relates compliance  Patient here for follow-up regarding cholesterol.     Patient relates taking medication on a regular basis Denies problems with medication Importance of dietary measures discussed Regular lab work regarding lipid and liver was checked and if needing additional labs was appropriately ordered  Mild obesity       Review of Systems     Objective:   Physical Exam  General-in no acute distress Eyes-no discharge Lungs-respiratory rate normal, CTA CV-no murmurs,RRR Extremities skin warm dry no edema Neuro grossly normal Behavior normal, alert       Assessment & Plan:  The patient was seen in followup for chronic pain. A review over at their current pain status was discussed. Drug registry was checked. Prescriptions were given.  Regular follow-up recommended. Discussion was held regarding the importance of compliance with medication as well as pain medication contract.  Patient was informed that medication may cause drowsiness and should not be combined  with other medications/alcohol or street drugs. If the patient feels medication is causing altered alertness then do not drive or operate dangerous equipment.  Should be noted that the patient appears to be meeting appropriate use of opioids and response.  Evidenced by improved function and decent pain control without significant side effects and no evidence of overt aberrancy issues.  Upon discussion with the patient today they understand that opioid therapy is optional and they feel that the pain has been refractory to reasonable conservative measures and is significant and affecting quality of life enough to warrant ongoing therapy and  wishes to continue opioids.  Refills were provided.  Patient with ongoing wrist pain and discomfort she is contemplating seeing orthopedics for possible injections or holding off on surgery  HTN- patient seen for follow-up regarding HTN.   Diet, medication compliance, appropriate labs and refills were completed.   Importance of keeping blood  pressure under good control to lessen the risk of complications discussed Regular follow-up visits discussed  She is taking her cholesterol medicine trying healthy recent lab work looks good  Chronic low back pain with sciatica does not tolerate gabapentin or Lyrica.  Currently on Cymbalta.  Pain medicine does help we have adjusted up the dose to no more than 3/day to try to help keep her pain under reasonable control she will get 80/month and this was a shared decision follow-up in approximately 3 months  UDT on next visit

## 2023-05-31 DIAGNOSIS — G4733 Obstructive sleep apnea (adult) (pediatric): Secondary | ICD-10-CM | POA: Diagnosis not present

## 2023-06-01 DIAGNOSIS — Z1283 Encounter for screening for malignant neoplasm of skin: Secondary | ICD-10-CM | POA: Diagnosis not present

## 2023-06-01 DIAGNOSIS — I872 Venous insufficiency (chronic) (peripheral): Secondary | ICD-10-CM | POA: Diagnosis not present

## 2023-06-01 DIAGNOSIS — D3617 Benign neoplasm of peripheral nerves and autonomic nervous system of trunk, unspecified: Secondary | ICD-10-CM | POA: Diagnosis not present

## 2023-06-01 DIAGNOSIS — D2372 Other benign neoplasm of skin of left lower limb, including hip: Secondary | ICD-10-CM | POA: Diagnosis not present

## 2023-06-01 DIAGNOSIS — D225 Melanocytic nevi of trunk: Secondary | ICD-10-CM | POA: Diagnosis not present

## 2023-06-16 ENCOUNTER — Ambulatory Visit (INDEPENDENT_AMBULATORY_CARE_PROVIDER_SITE_OTHER): Payer: PPO | Admitting: Family Medicine

## 2023-06-16 VITALS — BP 112/71 | HR 78 | Temp 98.0°F | Ht 62.0 in | Wt 172.0 lb

## 2023-06-16 DIAGNOSIS — L0390 Cellulitis, unspecified: Secondary | ICD-10-CM

## 2023-06-16 DIAGNOSIS — Z1211 Encounter for screening for malignant neoplasm of colon: Secondary | ICD-10-CM

## 2023-06-16 DIAGNOSIS — R52 Pain, unspecified: Secondary | ICD-10-CM

## 2023-06-16 MED ORDER — HYDROCODONE-ACETAMINOPHEN 7.5-325 MG PO TABS: ORAL_TABLET | ORAL | 0 refills | Status: DC

## 2023-06-16 MED ORDER — CEPHALEXIN 500 MG PO CAPS: 500.0000 mg | ORAL_CAPSULE | Freq: Four times a day (QID) | ORAL | 0 refills | Status: DC

## 2023-06-16 NOTE — Progress Notes (Signed)
Subjective:    Patient ID: Emily [name], female    DOB: 12-03-1951, 72 y.o.   MRN: [id number]  Place on back of left calf removed 2 weeks ago - serosaguinous drainage yesterday   HPI This patient was seen today for chronic pain  The medication list was reviewed and updated.   Location of Pain for which the patient has been treated with regarding narcotics: Pain in the back radiates down the legs. Also orthopedic pain in her knees hips and right wrist  Right wrist pain, back pain low to mid back, right sciatica Bending makes it worse Practicing stretches and good ergonomics Onset of this pain: Years   -Compliance with medication: daily  - Number patient states they take daily: 2 to 3 daily  -Reason for ongoing use of opioids unfortunately Tylenol NSAIDs and other measures have not adequately released her pain  What other measures have been tried outside of opioids Tylenol NSAIDs injections physical therapy surgery  In the ongoing specialists regarding this condition she sees her back specialist once or twice a year  -when was the last dose patient took?  This  morning The patient was advised the importance of maintaining medication and not using illegal substances with these.  Here for refills and follow up  The patient was educated that we can provide 3 monthly scripts for their medication, it is their responsibility to follow the instructions.  Side effects or complications from medications: no  Patient is aware that pain medications are meant to minimize the severity of the pain to allow their pain levels to improve to allow for better function. They are aware of that pain medications cannot totally remove their pain.  Due for UDT ( at least once per year) (pain management contract is also completed at the time of the UDT): 01/19/2022  Scale of 1 to 10 ( 1 is least 10 is most) Your pain level without the medicine: 9 Your pain level with medication 2  Scale 1 to 10  ( 1-helps very little, 10 helps very well) How well does your pain medication reduce your pain so you can function better through out the day? 8  Quality of the pain: Constant aching  Persistence of the pain: Present all the time  Modifying factors: Worse with activity  Patient also has an area that she had a skin mole removed and they cauterized it this it was multiple days ago now its red and draining some pus with drainage.  Denies any other particular troubles.  No fevers with it    Her blood pressure under good control  Review of Systems     Objective:   Physical Exam General-in no acute distress Eyes-no discharge Lungs-respiratory rate normal, CTA CV-no murmurs,RRR Extremities skin warm dry no edema Neuro grossly normal Behavior normal, alert Localized cellulitis where the area was cut off the back of her leg       Assessment & Plan:   1. Encounter for pain management The patient was seen in followup for chronic pain. A review over at their current pain status was discussed. Drug registry was checked. Prescriptions were given.  Regular follow-up recommended. Discussion was held regarding the importance of compliance with medication as well as pain medication contract.  Patient was informed that medication may cause drowsiness and should not be combined  with other medications/alcohol or street drugs. If the patient feels medication is causing altered alertness then do not drive or operate dangerous equipment.  Should  be noted that the patient appears to be meeting appropriate use of opioids and response.  Evidenced by improved function and decent pain control without significant side effects and no evidence of overt aberrancy issues.  Upon discussion with the patient today they understand that opioid therapy is optional and they feel that the pain has been refractory to reasonable conservative measures and is significant and affecting quality of life enough to warrant  ongoing therapy and wishes to continue opioids.  Refills were provided. PDMP was checked - [employer] Select 13 (MW), Urine  2. Screening for colon cancer Referral for colonoscopy - Ambulatory referral to Gastroenterology  3. Cellulitis, unspecified cellulitis site Keflex 5 days warm compresses warning signs discussed side effects of antibiotics discussed

## 2023-06-17 LAB — MED LIST OPTION NOT SELECTED

## 2023-06-21 ENCOUNTER — Encounter (INDEPENDENT_AMBULATORY_CARE_PROVIDER_SITE_OTHER): Payer: Self-pay | Admitting: *Deleted

## 2023-06-21 LAB — SPECIMEN STATUS REPORT

## 2023-06-21 LAB — TOXASSURE SELECT 13 (MW), URINE

## 2023-07-08 ENCOUNTER — Other Ambulatory Visit (HOSPITAL_COMMUNITY): Payer: Self-pay | Admitting: Family Medicine

## 2023-07-08 DIAGNOSIS — Z1231 Encounter for screening mammogram for malignant neoplasm of breast: Secondary | ICD-10-CM

## 2023-07-11 DIAGNOSIS — H353131 Nonexudative age-related macular degeneration, bilateral, early dry stage: Secondary | ICD-10-CM | POA: Diagnosis not present

## 2023-08-01 ENCOUNTER — Inpatient Hospital Stay (HOSPITAL_COMMUNITY): Admission: RE | Admit: 2023-08-01 | Payer: PPO | Source: Ambulatory Visit

## 2023-09-02 DIAGNOSIS — H16223 Keratoconjunctivitis sicca, not specified as Sjogren's, bilateral: Secondary | ICD-10-CM | POA: Diagnosis not present

## 2023-09-02 DIAGNOSIS — Z961 Presence of intraocular lens: Secondary | ICD-10-CM | POA: Diagnosis not present

## 2023-09-02 DIAGNOSIS — H353131 Nonexudative age-related macular degeneration, bilateral, early dry stage: Secondary | ICD-10-CM | POA: Diagnosis not present

## 2023-09-23 ENCOUNTER — Ambulatory Visit: Payer: PPO | Admitting: Family Medicine

## 2023-09-23 VITALS — BP 130/77 | HR 75 | Wt 173.0 lb

## 2023-09-23 DIAGNOSIS — R52 Pain, unspecified: Secondary | ICD-10-CM

## 2023-09-23 DIAGNOSIS — F419 Anxiety disorder, unspecified: Secondary | ICD-10-CM | POA: Diagnosis not present

## 2023-09-23 MED ORDER — ALPRAZOLAM 0.25 MG PO TABS: ORAL_TABLET | ORAL | 0 refills | Status: AC

## 2023-09-23 MED ORDER — HYDROCODONE-ACETAMINOPHEN 7.5-325 MG PO TABS: ORAL_TABLET | ORAL | 0 refills | Status: DC

## 2023-09-23 NOTE — Progress Notes (Unsigned)
Subjective:    Patient ID: Emily [name], female    DOB: [birth year], 72 y.o.   MRN: [id number]  Discussed the use of AI scribe software for clinical note transcription with the patient, who gave verbal consent to proceed.  History of Present Illness   The patient, with a history of arthritis, presents with worsening back pain, now extending higher up the spine and into the neck. The discomfort is localized to the spine and does not radiate down the arms. The patient also reports persistent lower back pain, which has remained consistent over time. The right leg is primarily affected, with the patient experiencing pins and needles sensations in the feet, described as annoying rather than painful. These sensations occur regardless of the patient's position and at any time.  The patient notes difficulty standing after sitting for extended periods and experiences dizziness upon rising from bed. However, she is able to walk without issue and maintain balance. The patient is currently on a regimen of cholesterol medication, potassium, and Ezetimibe, along with hydrocodone two to three times a day for pain management. The hydrocodone effectively alleviates the pain until shortly before the next dose is due.  The patient also mentions an incident several months prior where moving her leg caused significant pain, not in the bone but in the tissue. However, this has not progressed or recurred. The patient has previously been informed of extensive arthritis in the spine and is considering self-management strategies such as stretching and massage before opting for further testing. She is aware of the warning signs that would necessitate further medical intervention, such as radiating pain down the arms or weakness in the legs affecting mobility.     The pati This patient was seen today for chronic pain  The medication list was reviewed and updated.   Location of Pain for which the patient has been treated  with regarding narcotics: Back and lower back  Onset of this pain: Present for years   -Compliance with medication: Present for years good compliance  - Number patient states they take daily: Typically 3 on some days to  -Reason for ongoing use of opioids severe low back pain mid back pain  What other measures have been tried outside of opioids has tried Tylenol, exercises, has had surgery physical therapy  In the ongoing specialists regarding this condition seen back specialist intermittently  -when was the last dose patient took?  Earlier today  The patient was advised the importance of maintaining medication and not using illegal substances with these.  Here for refills and follow up  The patient was educated that we can provide 3 monthly scripts for their medication, it is their responsibility to follow the instructions.  Side effects or complications from medications: Denies side effects  Patient is aware that pain medications are meant to minimize the severity of the pain to allow their pain levels to improve to allow for better function. They are aware of that pain medications cannot totally remove their pain.  Due for UDT ( at least once per year) (pain management contract is also completed at the time of the UDT): Summertime  Scale of 1 to 10 ( 1 is least 10 is most) Your pain level without the medicine: 9 Your pain level with medication 3  Scale 1 to 10 ( 1-helps very little, 10 helps very well) How well does your pain medication reduce your pain so you can function better through out the day? 7  Quality of  the pain: Throbbing aching  Persistence of the pain: Persistent all the time  Modifying factors: Worse with activity   Patient not depressed but under a lot of stress Finds himself feeling anxious at times overwhelmed at times Taking Cymbalta already On hydrocodone but understands she cannot use alprazolam at the same time as hydrocodone and not to drive with  the medicine   Understandably patient under a lot of stress with emotional issues from her husband suffering with significant illness  Review of Systems     Objective:    Physical Exam   CHEST: Lung sounds clear on auscultation. CARDIOVASCULAR: Heart sounds normal on auscultation. EXTREMITIES: No edema at ankles.     General-in no acute distress Eyes-no discharge Lungs-respiratory rate normal, CTA CV-no murmurs,RRR Extremities skin warm dry no edema Neuro grossly normal Behavior normal, alert Subjective mid back pain subjective low back pain increased pain with rotation and pulling on the legs      Assessment & Plan:  Assessment and Plan    Chronic Back Pain Increased mid-thoracic pain and persistent lower back pain. No radiation into arms. Right leg pain and paresthesia in feet. No weakness or balance issues. Pain managed with Hydrocodone 2-3 times daily. -Continue current pain management regimen. -Consider stretches and massage for pain relief. -Monitor for warning signs such as radiating pain down arms or leg weakness.  Hypertension Managed with daily potassium and Ezetimibe. -Continue current medication regimen.  General Health Maintenance -Plan to receive flu shot in November. -Consider colon testing at next visit.      The patient was seen in followup for chronic pain. A review over at their current pain status was discussed. Drug registry was checked. Prescriptions were given.  Regular follow-up recommended. Discussion was held regarding the importance of compliance with medication as well as pain medication contract.  Patient was informed that medication may cause drowsiness and should not be combined  with other medications/alcohol or street drugs. If the patient feels medication is causing altered alertness then do not drive or operate dangerous equipment.  Should be noted that the patient appears to be meeting appropriate use of opioids and response.   Evidenced by improved function and decent pain control without significant side effects and no evidence of overt aberrancy issues.  Upon discussion with the patient today they understand that opioid therapy is optional and they feel that the pain has been refractory to reasonable conservative measures and is significant and affecting quality of life enough to warrant ongoing therapy and wishes to continue opioids.  Refills were provided.  [facility] medical Board guidelines regarding the pain medicine has been reviewed.  CDC guidelines most updated 2022 has been reviewed by the prescriber.  PDMP is checked on a regular basis yearly urine drug screen and pain management contract Please see questionnaire that the patient filled out    mild anxiety related to her husband's current health issues Xanax may be used sparingly not concurrent with hydrocodone caution drowsiness

## 2023-10-03 ENCOUNTER — Ambulatory Visit (HOSPITAL_COMMUNITY)
Admission: RE | Admit: 2023-10-03 | Discharge: 2023-10-03 | Disposition: A | Discharge status: Home / Self Care | Payer: PPO | Source: Ambulatory Visit | Attending: Family Medicine | Admitting: Family Medicine

## 2023-10-03 DIAGNOSIS — Z1231 Encounter for screening mammogram for malignant neoplasm of breast: Secondary | ICD-10-CM | POA: Insufficient documentation

## 2023-10-05 ENCOUNTER — Ambulatory Visit: Payer: PPO | Admitting: Internal Medicine

## 2023-12-06 DIAGNOSIS — G4733 Obstructive sleep apnea (adult) (pediatric): Secondary | ICD-10-CM | POA: Diagnosis not present

## 2023-12-14 NOTE — Progress Notes (Signed)
  Intake history:  There were no vitals taken for this visit. There is no height or weight on file to calculate BMI.    WHAT ARE WE SEEING YOU FOR TODAY?   right  Wrist   How long has this bothered you? No injury   A few  week(s) ago  Anticoag.  No  Diabetes No  Heart disease Yes  Hypertension Yes  SMOKING HX No  Kidney disease No  Any ALLERGIES ______________________________________________   Treatment:  Have you taken:  Tylenol  No  Advil  No  Had PT No  Had injection No  Other  __Hydrocodone from PCP Dr Alphonsa __wrist brace irritated it no relief pain with writing _____________________

## 2023-12-15 ENCOUNTER — Encounter: Payer: Self-pay | Admitting: Orthopedic Surgery

## 2023-12-15 ENCOUNTER — Other Ambulatory Visit (INDEPENDENT_AMBULATORY_CARE_PROVIDER_SITE_OTHER): Payer: Self-pay

## 2023-12-15 ENCOUNTER — Ambulatory Visit: Payer: PPO | Admitting: Orthopedic Surgery

## 2023-12-15 VITALS — BP 139/72 | HR 73 | Ht 62.0 in | Wt 175.0 lb

## 2023-12-15 DIAGNOSIS — M25531 Pain in right wrist: Secondary | ICD-10-CM

## 2023-12-15 DIAGNOSIS — M654 Radial styloid tenosynovitis [de Quervain]: Secondary | ICD-10-CM

## 2023-12-15 NOTE — Progress Notes (Signed)
 Office Visit Note   Patient: Emily [name]           Date of Birth: 28-Feb-1951           MRN: [id number] Visit Date: 12/15/2023 Requested by: Alphonsa Glendia LABOR, MD [address] [place],  [place] [address] PCP: Alphonsa Glendia LABOR, MD   Assessment & Plan:   Encounter Diagnoses  Name Primary?   Pain in right wrist    De Quervain's disease (radial styloid tenosynovitis) Yes    No orders of the defined types were placed in this encounter.   Emily [name] appears to be having de Quervain's tenosynovitis recommend topical medications as she did not want to take anything orally  Diclofenac 4 times a day  Ice 3 times a day  Thumb splint  Follow-up if no improvement after 6 weeks   Subjective: Chief Complaint  Patient presents with   Wrist Pain    Has pain dorsal hand and wrist area / no recent injury had fracture years ago     HPI: 73 year old female presents with pain over the dorsal radial side of her right wrist.  She had a fracture of her right wrist a few years back  She complains of pain of the dorsum of the hand and pain when picking up small to medium sized objects  She is taken hydrocodone  for pain nothing else at this time she did wear a wrist splint but it did not help              ROS: No numbness or tingling in the hands or fingers   Images personally read and my interpretation :  DG Wrist Complete Right Result Date: 12/15/2023 Imaging report right wrist symptoms of de Quervain's tenosynovitis previous history of fracture 3 views.  Previous fracture not well-visualized but there is a nodule near the radial styloid No other deformity Mild carpal arthritis Impression mild carpal arthritis Nodule near the first extensor compartment Mild carpal arthritis     Visit Diagnoses:  1. De Quervain's disease (radial styloid tenosynovitis)   2. Pain in right wrist      Follow-Up Instructions: Return for SCHEDULE AN APPT IF NOT BETTER AFTER 6 WEEKS .     Objective: Vital Signs: BP 139/72   Pulse 73   Ht 5' 2 (1.575 m)   Wt 175 lb (79.4 kg)   BMI 32.01 kg/m   Physical Exam Vitals and nursing note reviewed.  Constitutional:      Appearance: Normal appearance.  HENT:     Head: Normocephalic and atraumatic.  Eyes:     General: No scleral icterus.       Right eye: No discharge.        Left eye: No discharge.     Extraocular Movements: Extraocular movements intact.     Conjunctiva/sclera: Conjunctivae normal.     Pupils: Pupils are equal, round, and reactive to light.  Cardiovascular:     Rate and Rhythm: Normal rate.     Pulses: Normal pulses.  Skin:    General: Skin is warm and dry.     Capillary Refill: Capillary refill takes less than 2 seconds.  Neurological:     General: No focal deficit present.     Mental Status: She is alert and oriented to person, place, and time.  Psychiatric:        Mood and Affect: Mood normal.        Behavior: Behavior normal.  Thought Content: Thought content normal.        Judgment: Judgment normal.      Ortho Exam  Right wrist exam tenderness over the first extensor compartment this is exacerbated by ulnar deviation of the wrist flexion extension seem to be normal there is some pain in the forearm on the radial side  No instability noted  Imaging was negative   Specialty Comments:  No specialty comments available.  Imaging: DG Wrist Complete Right Result Date: 12/15/2023 Imaging report right wrist symptoms of de Quervain's tenosynovitis previous history of fracture 3 views.  Previous fracture not well-visualized but there is a nodule near the radial styloid No other deformity Mild carpal arthritis Impression mild carpal arthritis Nodule near the first extensor compartment Mild carpal arthritis     PMFS History: Patient Active Problem List   Diagnosis Date Noted   Aortic atherosclerosis (HCC) 12/16/2022   Encounter for pain management 01/19/2022   Patellar clunk  syndrome of right knee    Encounter for long-term opiate analgesic use 04/22/2021   Elevated blood-pressure reading, without diagnosis of hypertension 09/25/2020   Exertional dyspnea    Chest pain of uncertain etiology    Fibromyalgia 09/10/2019   S/P TKR (total knee replacement), right 12/29/17 01/10/2018   Primary osteoarthritis of right knee    GERD without esophagitis 03/28/2017   Obesity (BMI 30-39.9) 03/28/2017   Obesity 10/06/2016   Lumbar foraminal stenosis 06/04/2016   Carpal tunnel syndrome, bilateral 02/07/2014   S/P lumbar spinal fusion 02/07/2014   Obstructive sleep apnea 02/04/2014   Hand arthritis 10/15/2013   HTN (hypertension), benign 08/24/2013   Hypertriglyceridemia 08/24/2013   Iliotibial band tendonitis 02/08/2013   Trigger finger, acquired 02/08/2013   Chronic midline back pain 06/22/2011   HNP (herniated nucleus pulposus) 06/22/2011   HIP PAIN, RIGHT 12/15/2010   LUMBOSACRAL SPONDYLOSIS WITHOUT MYELOPATHY 12/15/2010   SPINAL STENOSIS, LUMBAR 12/15/2010   KNEE REPLACEMENT, HX OF 02/26/2010   DERANGEMENT MENISCUS 07/03/2009   KNEE, ARTHRITIS, DEGEN./OSTEO 05/22/2009   KNEE PAIN 05/22/2009   ANSERINE BURSITIS 05/22/2009   TRIGGER FINGER 09/23/2008   Past Medical History:  Diagnosis Date   Acid reflux    Anxiety    Arthritis    CHF (congestive heart failure) (HCC)    90's swelling body-no breathing problems at that time-no cardiac -in dr s alphonsa office tinnie give lasix  and potassium   Complication of anesthesia    History of being told she was allergic to something in 1980 no problems since   Depression    Fibromyalgia    Fibromyalgia 09/10/2019   History of blood transfusion reaction 1978   History of hiatal hernia    HNP (herniated nucleus pulposus)    Hypertension    Does not see a cardiologist no stress or echo   Joint pain    Nerve pain    right leg    Ruptured lumbar disc    Sleep apnea    Uses CPAP every night setting at 7 mm/Hg     Family History  Problem Relation Age of Onset   Hypertension Father    Cancer Father        liposarcoma   Heart disease Mother    Cancer Other        family history    Diabetes Other        family history    Coronary artery disease Other        family history    Arthritis Other  family history    Asthma Other    Diabetes Brother    Heart disease Brother    Coronary artery disease Brother 19       CABD    Past Surgical History:  Procedure Laterality Date   ablation of nerves in back s1 and lumbar  05/21/2021   [place] [facility] Dr Letha   BACK SURGERY     x3   BLADDER REPAIR     tear during during birth of child   CARPAL TUNNEL RELEASE Bilateral    CESAREAN SECTION     CHOLECYSTECTOMY     gallbladder disease; stayed sick   COLONOSCOPY N/A 04/28/2016   Procedure: COLONOSCOPY;  Surgeon: Claudis RAYMOND Rivet, MD;  Location: [facility];  Service: Endoscopy;  Laterality: N/A;   ESOPHAGOGASTRODUODENOSCOPY N/A 04/28/2016   Procedure: ESOPHAGOGASTRODUODENOSCOPY (EGD);  Surgeon: Claudis RAYMOND Rivet, MD;  Location: [facility];  Service: Endoscopy;  Laterality: N/A;  12:00   GALLBLADDER SURGERY     KNEE ARTHROSCOPY Right 06/16/2021   Procedure: ARTHROSCOPY KNEE WITH LIMITED DEBRIDEMENT;  Surgeon: Margrette Taft BRAVO, MD;  Location: [facility];  Service: Orthopedics;  Laterality: Right;   knee right (torn Cart)     REPLACEMENT TOTAL KNEE     left   RIGHT/LEFT HEART CATH AND CORONARY ANGIOGRAPHY N/A 01/23/2020   Procedure: RIGHT/LEFT HEART CATH AND CORONARY ANGIOGRAPHY;  Surgeon: Burnard Debby LABOR, MD;  Location: [facility];  Service: Cardiovascular;  Laterality: N/A;   ROTATOR CUFF REPAIR Bilateral    right and left   SPINAL FUSION     TOTAL KNEE ARTHROPLASTY Right 12/29/2017   Procedure: TOTAL KNEE ARTHROPLASTY-right knee;  Surgeon: Margrette Taft BRAVO, MD;  Location: [facility];  Service: Orthopedics;  Laterality: Right;   TUBAL LIGATION     vein right leg removed     wrist  ganglion cyst left     Social History   Occupational History   Occupation: nurse   Tobacco Use   Smoking status: Never   Smokeless tobacco: Never  Vaping Use   Vaping status: Never Used  Substance and Sexual Activity   Alcohol use: No   Drug use: No   Sexual activity: Never    Birth control/protection: Post-menopausal

## 2023-12-15 NOTE — Patient Instructions (Signed)
 Ice the area 3 x a day for 20 min   Apply diclofenac gel 4 x a day   Wear thumb splint for 6 weeks

## 2023-12-19 ENCOUNTER — Ambulatory Visit: Payer: PPO | Admitting: Family Medicine

## 2023-12-19 VITALS — BP 117/72 | HR 73 | Temp 98.4°F | Ht 62.0 in | Wt 176.8 lb

## 2023-12-19 DIAGNOSIS — Z79899 Other long term (current) drug therapy: Secondary | ICD-10-CM | POA: Diagnosis not present

## 2023-12-19 DIAGNOSIS — E785 Hyperlipidemia, unspecified: Secondary | ICD-10-CM

## 2023-12-19 DIAGNOSIS — M25531 Pain in right wrist: Secondary | ICD-10-CM

## 2023-12-19 DIAGNOSIS — I10 Essential (primary) hypertension: Secondary | ICD-10-CM

## 2023-12-19 DIAGNOSIS — Z23 Encounter for immunization: Secondary | ICD-10-CM

## 2023-12-19 DIAGNOSIS — R52 Pain, unspecified: Secondary | ICD-10-CM

## 2023-12-19 MED ORDER — AMLODIPINE BESYLATE 5 MG PO TABS: 5.0000 mg | ORAL_TABLET | Freq: Every day | ORAL | 1 refills | Status: DC

## 2023-12-19 MED ORDER — HYDROCODONE-ACETAMINOPHEN 7.5-325 MG PO TABS: ORAL_TABLET | ORAL | 0 refills | Status: DC

## 2023-12-19 NOTE — Progress Notes (Signed)
 Subjective:    Patient ID: Emily [name], female    DOB: 1951/09/06, 73 y.o.   MRN: [id number]  Discussed the use of AI scribe software for clinical note transcription with the patient, who gave verbal consent to proceed.  History of Present Illness   T   Lastly, the patient reports a recent visit to another provider for wrist pain, which was diagnosed as inflammation in the thumb and tendons. The patient was advised to wear a brace for six weeks and follow up if symptoms did not improve. The patient denies any other significant health concerns at this time.     This patient was seen today for chronic pain  The medication list was reviewed and updated.   Location of Pain for which the patient has been treated with regarding narcotics: Low back knees  Onset of this pain: Present for years   -Compliance with medication: Good compliance  - Number patient states they take daily: Takes approximately 2 on most days occasionally 3/day denies drowsiness  -Reason for ongoing use of opioids relates Tylenol  NSAIDs have not done enough  What other measures have been tried outside of opioids Tylenol  NSAIDs  In the ongoing specialists regarding this condition has seen back specialist previously has had previous surgery  -when was the last dose patient took?  Within the past 48 hours  The patient was advised the importance of maintaining medication and not using illegal substances with these.  Here for refills and follow up  The patient was educated that we can provide 3 monthly scripts for their medication, it is their responsibility to follow the instructions.  Side effects or complications from medications: Denies side effects  Patient is aware that pain medications are meant to minimize the severity of the pain to allow their pain levels to improve to allow for better function. They are aware of that pain medications cannot totally remove their pain.  Due for UDT ( at least once  per year) (pain management contract is also completed at the time of the UDT): Summer of 2024  Scale of 1 to 10 ( 1 is least 10 is most) Your pain level without the medicine: 9 Your pain level with medication 6  Scale 1 to 10 ( 1-helps very little, 10 helps very well) How well does your pain medication reduce your pain so you can function better through out the day?  8  Quality of the pain: Throbbing aching  Persistence of the pain: Present all the time         Review of Systems     Objective:    Physical Exam   VITALS: BP- 128/86 CHEST: Lungs clear to auscultation. CARDIOVASCULAR: Heart sounds normal. MUSCULOSKELETAL: Tenderness upon palpation of the chest wall, suggestive of costochondritis.           Assessment & Plan:  Assessment and Plan    General Health Maintenance -Administer influenza vaccine today. -Consider RSV vaccine at the pharmacy in two weeks. -Plan for colonoscopy between March and June 2025. -Continue taking prescribed cholesterol medication. -Check cholesterol levels before next visit in April 2025.     1. Encounter for pain management (Primary) The patient was seen in followup for chronic pain. A review over at their current pain status was discussed. Drug registry was checked. Prescriptions were given.  Regular follow-up recommended. Discussion was held regarding the importance of compliance with medication as well as pain medication contract.  Patient was informed that medication may cause  drowsiness and should not be combined  with other medications/alcohol or street drugs. If the patient feels medication is causing altered alertness then do not drive or operate dangerous equipment.  Should be noted that the patient appears to be meeting appropriate use of opioids and response.  Evidenced by improved function and decent pain control without significant side effects and no evidence of overt aberrancy issues.  Upon discussion with the patient  today they understand that opioid therapy is optional and they feel that the pain has been refractory to reasonable conservative measures and is significant and affecting quality of life enough to warrant ongoing therapy and wishes to continue opioids.  Refills were provided.  [place]  medical Board guidelines regarding the pain medicine has been reviewed.  CDC guidelines most updated 2022 has been reviewed by the prescriber.  PDMP is checked on a regular basis yearly urine drug screen and pain management contract   2. Right wrist pain Under the care of orthopedics may use NSAIDs briefly up to 7 to 10 days at a time  3. HTN (hypertension), benign Blood pressure good control check lab work - Basic Metabolic Panel - Microalbumin/Creatinine Ratio, Urine  4. Hyperlipidemia, unspecified hyperlipidemia type Patient due for lab work before her next visit check labs - Lipid Panel  5. High risk medication use Continue medication watch diet check labs - Hepatic Function Panel  6. Immunization due Flu shot recommended - Flu Vaccine Trivalent High Dose (Fluad)

## 2023-12-22 ENCOUNTER — Encounter (INDEPENDENT_AMBULATORY_CARE_PROVIDER_SITE_OTHER): Payer: Self-pay | Admitting: *Deleted

## 2023-12-23 ENCOUNTER — Ambulatory Visit: Payer: PPO | Admitting: Family Medicine

## 2024-01-26 ENCOUNTER — Ambulatory Visit
Admission: EM | Admit: 2024-01-26 | Discharge: 2024-01-26 | Disposition: A | Discharge status: Home / Self Care | Payer: PPO | Attending: Nurse Practitioner | Admitting: Nurse Practitioner

## 2024-01-26 ENCOUNTER — Encounter: Payer: Self-pay | Admitting: Emergency Medicine

## 2024-01-26 DIAGNOSIS — R6889 Other general symptoms and signs: Secondary | ICD-10-CM

## 2024-01-26 LAB — POC COVID19/FLU A&B COMBO
Covid Antigen, POC: NEGATIVE
Influenza A Antigen, POC: NEGATIVE
Influenza B Antigen, POC: NEGATIVE

## 2024-01-26 MED ORDER — OSELTAMIVIR PHOSPHATE 75 MG PO CAPS: 75.0000 mg | ORAL_CAPSULE | Freq: Two times a day (BID) | ORAL | 0 refills | Status: DC

## 2024-01-26 MED ORDER — PROMETHAZINE-DM 6.25-15 MG/5ML PO SYRP: 5.0000 mL | ORAL_SOLUTION | Freq: Four times a day (QID) | ORAL | 0 refills | Status: DC | PRN

## 2024-01-26 MED ORDER — FLUTICASONE PROPIONATE 50 MCG/ACT NA SUSP: 2.0000 | Freq: Every day | NASAL | 0 refills | Status: AC

## 2024-01-26 NOTE — ED Provider Notes (Signed)
[facility]    CSN: [id number] Arrival date & time: 01/26/24  1002      History   Chief Complaint No chief complaint on file.   HPI Emily [name] is a 73 y.o. female.   The history is provided by the patient.   Patient presents for complaints of fever, cough, body aches, sore throat, and bilateral ear pain that started over the past 24 hours.  Tmax around 100.  Denies ear drainage, headache, wheezing, difficulty breathing, chest pain, abdominal pain, nausea, vomiting, diarrhea, or rash.  Patient reports that she has been taking over-the-counter cough medications for her symptoms along with Tylenol.  Denies any obvious known sick contacts.  Patient states that she took a home COVID test this morning which was negative.  Past Medical History:  Diagnosis Date   Acid reflux    Anxiety    Arthritis    CHF (congestive heart failure) (HCC)    90's swelling body-no breathing problems at that time-no cardiac -in dr s Gerda Diss office [place] give lasix and potassium   Complication of anesthesia    History of being told she was allergic to something in 1980 no problems since   Depression    Fibromyalgia    Fibromyalgia 09/10/2019   History of blood transfusion reaction 1978   History of hiatal hernia    HNP (herniated nucleus pulposus)    Hypertension    Does not see a cardiologist no stress or echo   Joint pain    Nerve pain    right leg    Ruptured lumbar disc    Sleep apnea    Uses CPAP every night setting at 7 mm/Hg    Patient Active Problem List   Diagnosis Date Noted   Aortic atherosclerosis (HCC) 12/16/2022   Encounter for pain management 01/19/2022   Patellar clunk syndrome of right knee    Encounter for long-term opiate analgesic use 04/22/2021   Elevated blood-pressure reading, without diagnosis of hypertension 09/25/2020   Exertional dyspnea    Chest pain of uncertain etiology    Fibromyalgia 09/10/2019   S/P TKR (total knee replacement), right  12/29/17 01/10/2018   Primary osteoarthritis of right knee    GERD without esophagitis 03/28/2017   Obesity (BMI 30-39.9) 03/28/2017   Obesity 10/06/2016   Lumbar foraminal stenosis 06/04/2016   Carpal tunnel syndrome, bilateral 02/07/2014   S/P lumbar spinal fusion 02/07/2014   Obstructive sleep apnea 02/04/2014   Hand arthritis 10/15/2013   HTN (hypertension), benign 08/24/2013   Hypertriglyceridemia 08/24/2013   Iliotibial band tendonitis 02/08/2013   Trigger finger, acquired 02/08/2013   Chronic midline back pain 06/22/2011   HNP (herniated nucleus pulposus) 06/22/2011   HIP PAIN, RIGHT 12/15/2010   LUMBOSACRAL SPONDYLOSIS WITHOUT MYELOPATHY 12/15/2010   SPINAL STENOSIS, LUMBAR 12/15/2010   KNEE REPLACEMENT, HX OF 02/26/2010   DERANGEMENT MENISCUS 07/03/2009   KNEE, ARTHRITIS, DEGEN./OSTEO 05/22/2009   KNEE PAIN 05/22/2009   ANSERINE BURSITIS 05/22/2009   TRIGGER FINGER 09/23/2008    Past Surgical History:  Procedure Laterality Date   ablation of nerves in back s1 and lumbar  05/21/2021   [place] nuero Dr Murray Hodgkins   BACK SURGERY     x3   BLADDER REPAIR     tear during during birth of child   CARPAL TUNNEL RELEASE Bilateral    CESAREAN SECTION     CHOLECYSTECTOMY     gallbladder disease; stayed sick   COLONOSCOPY N/A 04/28/2016   Procedure: COLONOSCOPY;  Surgeon:  Malissa Hippo, MD;  Location: [facility];  Service: Endoscopy;  Laterality: N/A;   ESOPHAGOGASTRODUODENOSCOPY N/A 04/28/2016   Procedure: ESOPHAGOGASTRODUODENOSCOPY (EGD);  Surgeon: Malissa Hippo, MD;  Location: [facility];  Service: Endoscopy;  Laterality: N/A;  12:00   GALLBLADDER SURGERY     KNEE ARTHROSCOPY Right 06/16/2021   Procedure: ARTHROSCOPY KNEE WITH LIMITED DEBRIDEMENT;  Surgeon: Vickki Hearing, MD;  Location: [facility];  Service: Orthopedics;  Laterality: Right;   knee right (torn Cart)     REPLACEMENT TOTAL KNEE     left   RIGHT/LEFT HEART CATH AND CORONARY ANGIOGRAPHY N/A  01/23/2020   Procedure: RIGHT/LEFT HEART CATH AND CORONARY ANGIOGRAPHY;  Surgeon: Lennette Bihari, MD;  Location: [facility];  Service: Cardiovascular;  Laterality: N/A;   ROTATOR CUFF REPAIR Bilateral    right and left   SPINAL FUSION     TOTAL KNEE ARTHROPLASTY Right 12/29/2017   Procedure: TOTAL KNEE ARTHROPLASTY-right knee;  Surgeon: Vickki Hearing, MD;  Location: [facility];  Service: Orthopedics;  Laterality: Right;   TUBAL LIGATION     vein right leg removed     wrist ganglion cyst left      OB History   No obstetric history on file.      Home Medications    Prior to Admission medications   Medication Sig Start Date End Date Taking? Authorizing Provider  fluticasone (FLONASE) 50 MCG/ACT nasal spray Place 2 sprays into both nostrils daily. 01/26/24  Yes Leath-Warren, Sadie Haber, NP  oseltamivir (TAMIFLU) 75 MG capsule Take 1 capsule (75 mg total) by mouth every 12 (twelve) hours. 01/26/24  Yes Leath-Warren, Sadie Haber, NP  promethazine-dextromethorphan (PROMETHAZINE-DM) 6.25-15 MG/5ML syrup Take 5 mLs by mouth 4 (four) times daily as needed. 01/26/24  Yes Leath-Warren, Sadie Haber, NP  acetaminophen (TYLENOL) 500 MG tablet Take 1,000 mg by mouth every 8 (eight) hours as needed for mild pain or moderate pain.    [provider]  ALPRAZolam Prudy Feeler) 0.25 MG tablet 1 twice daily as needed anxiety use sparingly caution drowsiness no driving with medicine 40/10/27   Babs Sciara, MD  amLODipine (NORVASC) 5 MG tablet Take 1 tablet (5 mg total) by mouth daily. 12/19/23   Babs Sciara, MD  DHA-Vitamin C-Lutein (EYE HEALTH FORMULA PO) Take 1 tablet by mouth daily.    [provider]  diphenhydrAMINE (BENADRYL ALLERGY) 25 MG tablet Take 1 tablet (25 mg total) by mouth every 4 (four) hours. Take with hydrocodone 12/31/17   Vickki Hearing, MD  DULoxetine (CYMBALTA) 60 MG capsule Take 1 capsule (60 mg total) by mouth daily. 03/17/23   Babs Sciara, MD   ezetimibe (ZETIA) 10 MG tablet Take 1 tablet (10 mg total) by mouth daily. 03/17/23   Babs Sciara, MD  fexofenadine (ALLEGRA) 180 MG tablet Take 180 mg by mouth daily.    [provider]  hydrochlorothiazide (HYDRODIURIL) 25 MG tablet Take 1 tablet (25 mg total) by mouth daily. 03/17/23   Babs Sciara, MD  HYDROcodone-acetaminophen (NORCO) 7.5-325 MG tablet 1 tid prn pain 12/19/23   Babs Sciara, MD  HYDROcodone-acetaminophen [facility]) 7.5-325 MG tablet One tid prn 12/19/23   Babs Sciara, MD  HYDROcodone-acetaminophen (NORCO) 7.5-325 MG tablet One tid prn pain to last 30 day 12/19/23   Babs Sciara, MD  potassium chloride SA (KLOR-CON M) 20 MEQ tablet Take 1 tablet (20 mEq total) by mouth daily. 03/17/23   Luking,  Scott A, MD  pravastatin (PRAVACHOL) 20 MG tablet Take 1 tablet (20 mg total) by mouth daily. 03/17/23   Babs Sciara, MD  Propylene Glycol (SYSTANE BALANCE) 0.6 % SOLN Place 1 drop into both eyes 2 (two) times daily.    [provider]    Family History Family History  Problem Relation Age of Onset   Hypertension Father    Cancer Father        liposarcoma   Heart disease Mother    Cancer Other        family history    Diabetes Other        family history    Coronary artery disease Other        family history    Arthritis Other        family history    Asthma Other    Diabetes Brother    Heart disease Brother    Coronary artery disease Brother 11       CABD    Social History Social History   Tobacco Use   Smoking status: Never   Smokeless tobacco: Never  Vaping Use   Vaping status: Never Used  Substance Use Topics   Alcohol use: No   Drug use: No     Allergies   Codeine, Oxycodone-acetaminophen, Sulfamethoxazole-trimethoprim, Celebrex [celecoxib], Hydrocodone-acetaminophen, Losartan, Lyrica [pregabalin], Mobic [meloxicam], and Tramadol   Review of Systems Review of Systems Per HPI  Physical Exam Triage Vital Signs ED  Triage Vitals  Encounter Vitals Group     BP 01/26/24 1030 130/75     Systolic BP Percentile --      Diastolic BP Percentile --      Pulse Rate 01/26/24 1030 78     Resp 01/26/24 1030 18     Temp 01/26/24 1030 98.9 F (37.2 C)     Temp Source 01/26/24 1030 Oral     SpO2 01/26/24 1030 93 %     Weight --      Height --      Head Circumference --      Peak Flow --      Pain Score 01/26/24 1031 6     Pain Loc --      Pain Education --      Exclude from Growth Chart --    No data found.  Updated Vital Signs BP 130/75 (BP Location: Right Arm)   Pulse 78   Temp 98.9 F (37.2 C) (Oral)   Resp 18   SpO2 93%   Visual Acuity Right Eye Distance:   Left Eye Distance:   Bilateral Distance:    Right Eye Near:   Left Eye Near:    Bilateral Near:     Physical Exam Vitals and nursing note reviewed.  Constitutional:      General: She is not in acute distress.    Appearance: Normal appearance.  HENT:     Head: Normocephalic.     Right Ear: Tympanic membrane, ear canal and external ear normal.     Left Ear: Tympanic membrane, ear canal and external ear normal.     Nose: Congestion present.     Right Turbinates: Enlarged and swollen.     Left Turbinates: Enlarged and swollen.     Right Sinus: No maxillary sinus tenderness or frontal sinus tenderness.     Left Sinus: No maxillary sinus tenderness or frontal sinus tenderness.     Mouth/Throat:     Lips: Pink.  Mouth: Mucous membranes are moist.     Pharynx: Uvula midline. Posterior oropharyngeal erythema and postnasal drip present. No pharyngeal swelling, oropharyngeal exudate or uvula swelling.     Comments: Cobblestoning present to posterior oropharynx  Eyes:     Extraocular Movements: Extraocular movements intact.     Pupils: Pupils are equal, round, and reactive to light.  Cardiovascular:     Rate and Rhythm: Normal rate and regular rhythm.     Pulses: Normal pulses.     Heart sounds: Normal heart sounds.  Pulmonary:      Effort: Pulmonary effort is normal.     Breath sounds: Normal breath sounds.  Abdominal:     General: Bowel sounds are normal.     Palpations: Abdomen is soft.     Tenderness: There is no abdominal tenderness.  Musculoskeletal:     Cervical back: Normal range of motion.  Lymphadenopathy:     Cervical: No cervical adenopathy.  Skin:    General: Skin is warm and dry.  Neurological:     General: No focal deficit present.     Mental Status: She is alert and oriented to person, place, and time.  Psychiatric:        Mood and Affect: Mood normal.        Behavior: Behavior normal.     UC Treatments / Results  Labs (all labs ordered are listed, but only abnormal results are displayed) Labs Reviewed  POC COVID19/FLU A&B COMBO - Normal    EKG   Radiology No results found.  Procedures Procedures (including critical care time)  Medications Ordered in UC Medications - No data to display  Initial Impression / Assessment and Plan / UC Course  I have reviewed the triage vital signs and the nursing notes.  Pertinent labs & imaging results that were available during my care of the patient were reviewed by me and considered in my medical decision making (see chart for details).  COVID/flu test negative, despite negative results, patient's symptoms are consistent with influenza possibly.  Will treat with Tamiflu 75 mg twice daily for the next 5 days.  Promethazine DM prescribed for her cough along with fluticasone 50 mcg nasal spray for nasal congestion.  Supportive care recommendations were provided and discussed with the patient to include fluids, rest, normal saline nasal spray, and over-the-counter analgesics.  Discussed indications regarding follow-up.  Patient was in agreement with this plan of care and verbalized understanding.  All questions were answered.  Patient stable for discharge.  Final Clinical Impressions(s) / UC Diagnoses   Final diagnoses:  Influenza-like symptoms      Discharge Instructions      The COVID/flu test was negative; however, based on your symptoms, I am going to treat you with Tamiflu.  I have also prescribed cough medicine for you.  Please be advised that the medicine may cause drowsiness, do not drive, operate heavy equipment, or drink alcohol while taking the medication. Increase fluids and allow for plenty of rest. May use normal saline nasal spray throughout the day for nasal congestion and runny nose. For your cough, it may be helpful to use a humidifier in your bedroom at nighttime during sleep and sleep elevated on pillows while cough symptoms persist. You should remain home until you have been fever free for 24 hours with no medication. Symptoms should improve over the next 5 to 7 days.  If symptoms fail to improve, or appear to be worsening, you may follow-up in this clinic or  with your primary care physician for further evaluation. Follow-up as needed.     ED Prescriptions     Medication Sig Dispense Auth. Provider   oseltamivir (TAMIFLU) 75 MG capsule Take 1 capsule (75 mg total) by mouth every 12 (twelve) hours. 10 capsule Leath-Warren, Sadie Haber, NP   promethazine-dextromethorphan (PROMETHAZINE-DM) 6.25-15 MG/5ML syrup Take 5 mLs by mouth 4 (four) times daily as needed. 118 mL Leath-Warren, Sadie Haber, NP   fluticasone (FLONASE) 50 MCG/ACT nasal spray Place 2 sprays into both nostrils daily. 16 g Leath-Warren, Sadie Haber, NP      PDMP not reviewed this encounter.   Abran Cantor, NP 01/26/24 1104

## 2024-01-26 NOTE — Discharge Instructions (Addendum)
The COVID/flu test was negative; however, based on your symptoms, I am going to treat you with Tamiflu.  I have also prescribed cough medicine for you.  Please be advised that the medicine may cause drowsiness, do not drive, operate heavy equipment, or drink alcohol while taking the medication. Increase fluids and allow for plenty of rest. May use normal saline nasal spray throughout the day for nasal congestion and runny nose. For your cough, it may be helpful to use a humidifier in your bedroom at nighttime during sleep and sleep elevated on pillows while cough symptoms persist. You should remain home until you have been fever free for 24 hours with no medication. Symptoms should improve over the next 5 to 7 days.  If symptoms fail to improve, or appear to be worsening, you may follow-up in this clinic or with your primary care physician for further evaluation. Follow-up as needed.

## 2024-01-26 NOTE — ED Triage Notes (Signed)
Cough since yesterday.  Has been taking mucinex DM.  States cough worse today, sore throat, eye and ears hurt.  States body aches started today.

## 2024-01-30 ENCOUNTER — Ambulatory Visit: Payer: PPO | Attending: Internal Medicine | Admitting: Internal Medicine

## 2024-01-30 ENCOUNTER — Encounter: Payer: Self-pay | Admitting: Physician Assistant

## 2024-01-30 ENCOUNTER — Ambulatory Visit (INDEPENDENT_AMBULATORY_CARE_PROVIDER_SITE_OTHER): Payer: PPO | Admitting: Physician Assistant

## 2024-01-30 ENCOUNTER — Encounter: Payer: Self-pay | Admitting: Internal Medicine

## 2024-01-30 VITALS — BP 114/70 | HR 86 | Temp 99.0°F | Ht 62.0 in | Wt 171.4 lb

## 2024-01-30 VITALS — BP 120/78 | HR 79 | Ht 62.0 in | Wt 174.0 lb

## 2024-01-30 DIAGNOSIS — I10 Essential (primary) hypertension: Secondary | ICD-10-CM

## 2024-01-30 DIAGNOSIS — E785 Hyperlipidemia, unspecified: Secondary | ICD-10-CM

## 2024-01-30 DIAGNOSIS — E781 Pure hyperglyceridemia: Secondary | ICD-10-CM | POA: Diagnosis not present

## 2024-01-30 DIAGNOSIS — J0110 Acute frontal sinusitis, unspecified: Secondary | ICD-10-CM

## 2024-01-30 MED ORDER — AMOXICILLIN-POT CLAVULANATE 875-125 MG PO TABS: 1.0000 | ORAL_TABLET | Freq: Two times a day (BID) | ORAL | 0 refills | Status: AC

## 2024-01-30 NOTE — Patient Instructions (Signed)
 Medication Instructions:  Your physician recommends that you continue on your current medications as directed. Please refer to the Current Medication list given to you today.  *If you need a refill on your cardiac medications before your next appointment, please call your pharmacy*   Lab Work: Your physician recommends that you return for lab work. ( NMR, A1C, Lp(a), Apo B, TSH)   If you have labs (blood work) drawn today and your tests are completely normal, you will receive your results only by: MyChart Message (if you have MyChart) OR A paper copy in the mail If you have any lab test that is abnormal or we need to change your treatment, we will call you to review the results.   Testing/Procedures: NONE    Follow-Up: At [facility], you and your health needs are our priority.  As part of our continuing mission to provide you with exceptional heart care, we have created designated Provider Care Teams.  These Care Teams include your primary Cardiologist (physician) and Advanced Practice Providers (APPs -  Physician Assistants and Nurse Practitioners) who all work together to provide you with the care you need, when you need it.  We recommend signing up for the patient portal called "MyChart".  Sign up information is provided on this After Visit Summary.  MyChart is used to connect with patients for Virtual Visits (Telemedicine).  Patients are able to view lab/test results, encounter notes, upcoming appointments, etc.  Non-urgent messages can be sent to your provider as well.   To learn more about what you can do with MyChart, go to [email or web address].    Your next appointment:   1 year(s)  Provider:   You may see Dietrich Pates, MD or one of the following Advanced Practice Providers on your designated Care Team:   Randall An, PA-C  Jacolyn Reedy, PA-C     Other Instructions Thank you for choosing [facility]!

## 2024-01-30 NOTE — Progress Notes (Signed)
 Cardiology Office Note   Date:  01/30/2024   ID:  Emily [name], DOB 01-03-1951, MRN [id number]  PCP:  Babs Sciara, MD  Cardiologist:   Dietrich Pates, MD    Pt presents for follow up of HTN    History of Present Illness: Emily [name] is a 73 y.o. female with a history ofHTN, HL, fibromyalgia, GERD  Pt also has a hx of SOB   SHe had L heart cath in Feb 2021  Normal coronary arteries (tortuous, no CAD)   LVEDP 13   Normal RH pressuress        Echo showed normal LVEF   Mild LVH   I saw the pt in Nov 2023   Since seen she has done well  BP is controlled  She denies CP  Breathing is good  NO palpitations   No dizziness    Says she stays active      Current Meds  Medication Sig   acetaminophen (TYLENOL) 500 MG tablet Take 1,000 mg by mouth every 8 (eight) hours as needed for mild pain or moderate pain.   ALPRAZolam (XANAX) 0.25 MG tablet 1 twice daily as needed anxiety use sparingly caution drowsiness no driving with medicine   amLODipine (NORVASC) 5 MG tablet Take 1 tablet (5 mg total) by mouth daily.   DHA-Vitamin C-Lutein (EYE HEALTH FORMULA PO) Take 1 tablet by mouth daily.   diphenhydrAMINE (BENADRYL ALLERGY) 25 MG tablet Take 1 tablet (25 mg total) by mouth every 4 (four) hours. Take with hydrocodone   DULoxetine (CYMBALTA) 60 MG capsule Take 1 capsule (60 mg total) by mouth daily.   ezetimibe (ZETIA) 10 MG tablet Take 1 tablet (10 mg total) by mouth daily.   fexofenadine (ALLEGRA) 180 MG tablet Take 180 mg by mouth daily.   fluticasone (FLONASE) 50 MCG/ACT nasal spray Place 2 sprays into both nostrils daily.   hydrochlorothiazide (HYDRODIURIL) 25 MG tablet Take 1 tablet (25 mg total) by mouth daily.   HYDROcodone-acetaminophen (NORCO) 7.5-325 MG tablet 1 tid prn pain   potassium chloride SA (KLOR-CON M) 20 MEQ tablet Take 1 tablet (20 mEq total) by mouth daily.   pravastatin (PRAVACHOL) 20 MG tablet Take 1 tablet (20 mg total) by mouth daily.    promethazine-dextromethorphan (PROMETHAZINE-DM) 6.25-15 MG/5ML syrup Take 5 mLs by mouth 4 (four) times daily as needed.   Propylene Glycol (SYSTANE BALANCE) 0.6 % SOLN Place 1 drop into both eyes 2 (two) times daily.     Allergies:   Codeine, Oxycodone-acetaminophen, Sulfamethoxazole-trimethoprim, Celebrex [celecoxib], Hydrocodone-acetaminophen, Losartan, Lyrica [pregabalin], Mobic [meloxicam], and Tramadol   Past Medical History:  Diagnosis Date   Acid reflux    Anxiety    Arthritis    CHF (congestive heart failure) (HCC)    90's swelling body-no breathing problems at that time-no cardiac -in dr s Gerda Diss office [facility] give lasix and potassium   Complication of anesthesia    History of being told she was allergic to something in 1980 no problems since   Depression    Fibromyalgia    Fibromyalgia 09/10/2019   History of blood transfusion reaction 1978   History of hiatal hernia    HNP (herniated nucleus pulposus)    Hypertension    Does not see a cardiologist no stress or echo   Joint pain    Nerve pain    right leg    Ruptured lumbar disc    Sleep apnea    Uses CPAP every night setting  at 7 mm/Hg    Past Surgical History:  Procedure Laterality Date   ablation of nerves in back s1 and lumbar  05/21/2021    nuero Dr Murray Hodgkins   BACK SURGERY     x3   BLADDER REPAIR     tear during during birth of child   CARPAL TUNNEL RELEASE Bilateral    CESAREAN SECTION     CHOLECYSTECTOMY     gallbladder disease; stayed sick   COLONOSCOPY N/A 04/28/2016   Procedure: COLONOSCOPY;  Surgeon: Malissa Hippo, MD;  Location: [facility];  Service: Endoscopy;  Laterality: N/A;   ESOPHAGOGASTRODUODENOSCOPY N/A 04/28/2016   Procedure: ESOPHAGOGASTRODUODENOSCOPY (EGD);  Surgeon: Malissa Hippo, MD;  Location: [facility];  Service: Endoscopy;  Laterality: N/A;  12:00   GALLBLADDER SURGERY     KNEE ARTHROSCOPY Right 06/16/2021   Procedure: ARTHROSCOPY KNEE WITH LIMITED  DEBRIDEMENT;  Surgeon: Vickki Hearing, MD;  Location: [facility];  Service: Orthopedics;  Laterality: Right;   knee right (torn Cart)     REPLACEMENT TOTAL KNEE     left   RIGHT/LEFT HEART CATH AND CORONARY ANGIOGRAPHY N/A 01/23/2020   Procedure: RIGHT/LEFT HEART CATH AND CORONARY ANGIOGRAPHY;  Surgeon: Lennette Bihari, MD;  Location: [facility];  Service: Cardiovascular;  Laterality: N/A;   ROTATOR CUFF REPAIR Bilateral    right and left   SPINAL FUSION     TOTAL KNEE ARTHROPLASTY Right 12/29/2017   Procedure: TOTAL KNEE ARTHROPLASTY-right knee;  Surgeon: Vickki Hearing, MD;  Location: [facility];  Service: Orthopedics;  Laterality: Right;   TUBAL LIGATION     vein right leg removed     wrist ganglion cyst left       Social History:  The patient  reports that she has never smoked. She has never used smokeless tobacco. She reports that she does not drink alcohol and does not use drugs.   Family History:  The patient's family history includes Arthritis in an other family member; Asthma in an other family member; Cancer in her father and another family member; Coronary artery disease in an other family member; Coronary artery disease (age of onset: 19) in her brother; Diabetes in her brother and another family member; Heart disease in her brother and mother; Hypertension in her father.    ROS:  Please see the history of present illness. All other systems are reviewed and  Negative to the above problem except as noted.    PHYSICAL EXAM: VS:  BP 120/78 (Patient Position: Sitting, Cuff Size: Normal)   Pulse 79   Ht 5\' 2"  (1.575 m)   Wt 174 lb (78.9 kg)   SpO2 93%   BMI 31.83 kg/m   GEN: Obese 73 yo in no acute distress  HEENT: normal  Neck: JVP is normal  No bruits  Cardiac: RRR; no murmur  No LE  edema  Respiratory:  clear to auscultation GI: soft, nontender,  No hepatomegaly   EKG:  EKG shows NSR 72 bpm   Carotid USN   AUg 2021  IMPRESSION: Color duplex indicates  minimal homogeneous plaque, with no hemodynamically significant stenosis by duplex criteria in the extracranial cerebrovascular circulation.    Echo   March 2021  1. Left ventricular ejection fraction, by estimation, is 60 to 65%. The left ventricle has normal function. The left ventricle has no regional wall motion abnormalities. There is mild left ventricular hypertrophy. Left ventricular diastolic parameters are consistent with Grade I diastolic dysfunction (  impaired relaxation). 2. Right ventricular systolic function is normal. The right ventricular size is normal. There is normal pulmonary artery systolic pressure. 3. Left atrial size was mildly dilated. 4. The mitral valve is grossly normal. Trivial mitral valve regurgitation. 5. The aortic valve is tricuspid. Aortic valve regurgitation is not visualized. 6. The inferior vena cava is normal in size with greater than 50% respiratory variability, suggesting right atrial pressure of 3 mmHg.  R and L heart cath  Feb 2021    The left ventricular systolic function is normal.   Tortuous but otherwise normal coronary arteries without obstructive disease.   Normal LV function with left ventricular hypertrophy; LVEDP 13 mm   Normal right heart pressures.   RECOMMENDATION: Suspect nonischemic chest pain.  Recommend 2D echo Doppler to quantify extent of LVH and potential for diastolic dysfunction contributing to the patient's exertional dyspnea.  Patient will follow up with Dr. Dietrich Pates.   Lipid Panel    Component Value Date/Time   CHOL 139 03/08/2023 0820   TRIG 309 (H) 03/08/2023 0820   HDL 39 (L) 03/08/2023 0820   CHOLHDL 3.6 03/08/2023 0820   CHOLHDL 4.5 08/20/2013 0741   VLDL 59 (H) 08/20/2013 0741   LDLCALC 52 03/08/2023 0820      Wt Readings from Last 3 Encounters:  01/30/24 174 lb (78.9 kg)  01/30/24 171 lb 6.4 oz (77.7 kg)  12/19/23 176 lb 12.8 oz (80.2 kg)      ASSESSMENT AND PLAN:'   1  Hx chest pressue   Normal coronary arteries at cath   Pt remains asymptomatic   2  HTN  BP is well controlled   3  Atherosclerosis of aorta   Rx risk factors    4LIpids   Will check lipomed, Lpa  and Apo B  5  metabolics   Will check Hgb A1C    Encouraged pt to stay active   Walk 7000 steps per day at least Limit carbs    Current medicines are reviewed at length with the patient today.  The patient does not have concerns regarding medicines.  Signed, Dietrich Pates, MD  01/30/2024 2:33 PM    [facility] [address] [place], [place], [place]  [address] Phone: [phone]; Fax: [phone]

## 2024-01-30 NOTE — Progress Notes (Signed)
 Acute Office Visit  Subjective:     Patient ID: Emily [name], female    DOB: Apr 29, 1951, 73 y.o.   MRN: [id number]   Patient complains of congestion and headache described as pressure in her head. Symptoms include facial pain, low grade fever, nasal congestion, sinus pressure, and sore throat with no fever, chills, night sweats or weight loss. Onset of symptoms was 6 days ago, with cough and fever improving since onset, however congestion, headache, and facial pain worsening since that time. She is drinking plenty of fluids.  Past history is significant for no history of pneumonia or bronchitis. Patient is non-smoker.     Review of Systems  Constitutional:  Positive for fever and malaise/fatigue.  HENT:  Positive for congestion, ear pain, sinus pain and sore throat. Negative for ear discharge.   Respiratory:  Positive for cough and sputum production. Negative for shortness of breath.   Cardiovascular:  Negative for chest pain and palpitations.  Musculoskeletal:  Negative for myalgias.  Neurological:  Positive for headaches.        Objective:     BP 114/70   Pulse 86   Temp 99 F (37.2 C)   Ht 5\' 2"  (1.575 m)   Wt 171 lb 6.4 oz (77.7 kg)   SpO2 96%   BMI 31.35 kg/m   Physical Exam Vitals reviewed.  Constitutional:      General: She is not in acute distress.    Appearance: Normal appearance.  HENT:     Right Ear: Tympanic membrane normal.     Left Ear: Tympanic membrane normal.     Nose: Congestion present.     Mouth/Throat:     Mouth: Mucous membranes are moist.     Pharynx: Oropharynx is clear. Posterior oropharyngeal erythema present.  Eyes:     Extraocular Movements: Extraocular movements intact.     Conjunctiva/sclera: Conjunctivae normal.  Cardiovascular:     Rate and Rhythm: Normal rate and regular rhythm.     Heart sounds: No murmur heard.    No friction rub. No gallop.  Pulmonary:     Effort: Pulmonary effort is normal.     Breath sounds: Normal  breath sounds. No stridor. No wheezing, rhonchi or rales.  Musculoskeletal:        General: Normal range of motion.  Lymphadenopathy:     Cervical: No cervical adenopathy.  Skin:    General: Skin is warm and dry.     Capillary Refill: Capillary refill takes less than 2 seconds.  Neurological:     General: No focal deficit present.     Mental Status: She is alert and oriented to person, place, and time.  Psychiatric:        Mood and Affect: Mood normal.        Behavior: Behavior normal.     No results found for any visits on 01/30/24.      Assessment & Plan:  Acute non-recurrent frontal sinusitis -     Amoxicillin-Pot Clavulanate; Take 1 tablet by mouth 2 (two) times daily for 7 days.  Dispense: 14 tablet; Refill: 0   Presentation was consistent with sinusitis.  No evidence of other bacterial infections including pneumonia, pharyngitis, otitis media, or orbital cellulitis. Discussed that this fits the picture of viral vs bacterial sinusitis and that due to type and duration of symptoms and exam findings, we will treat as bacterial sinusitis.  Antibiotics prescribed. Advised to continue Tylenol at home. The patient was instructed to return if  the worsens in any way, especially if not tolerating fluids, increased sinus pain or swelling, worsening headache, persistent fever, difficulty swallowing or breathing, or as needed. The patient agreed with the plan.    Return if symptoms worsen or fail to improve.  Toni Amend Jenell Dobransky, PA-C

## 2024-02-17 ENCOUNTER — Ambulatory Visit (INDEPENDENT_AMBULATORY_CARE_PROVIDER_SITE_OTHER): Payer: PPO

## 2024-02-17 VITALS — BP 128/72 | Ht 62.0 in | Wt 170.0 lb

## 2024-02-17 DIAGNOSIS — Z0000 Encounter for general adult medical examination without abnormal findings: Secondary | ICD-10-CM | POA: Diagnosis not present

## 2024-02-17 NOTE — Progress Notes (Signed)
 Please attest and cosign this visit due to patients primary care provider not being in the office at the time the visit was completed.  Because this visit was a virtual/telehealth visit,  certain criteria was not obtained, such a blood pressure, CBG if applicable, and timed get up and go. Any medications not marked as "taking" were not mentioned during the medication reconciliation part of the visit. Any vitals not documented were not able to be obtained due to this being a telehealth visit or patient was unable to self-report a recent blood pressure reading due to a lack of equipment at home via telehealth. Vitals that have been documented are verbally provided by the patient.   Subjective:   Emily [name] is a 73 y.o. who presents for a Medicare Wellness preventive visit.  Visit Complete: Virtual I connected with  Emily [name] on 02/17/24 by a audio enabled telemedicine application and verified that I am speaking with the correct person using two identifiers.  Patient Location: Home  Provider Location: Home Office  I discussed the limitations of evaluation and management by telemedicine. The patient expressed understanding and agreed to proceed.  Vital Signs: Because this visit was a virtual/telehealth visit, some criteria may be missing or patient reported. Any vitals not documented were not able to be obtained and vitals that have been documented are patient reported.  VideoDeclined- This patient declined Librarian, academic. Therefore the visit was completed with audio only.  Persons Participating in Visit: Patient.  AWV Questionnaire: No: Patient Medicare AWV questionnaire was not completed prior to this visit.  Cardiac Risk Factors include: advanced age (>56men, >6 women);hypertension;Other (see comment), Risk factor comments: CHF, OSA     Objective:    Today's Vitals   02/17/24 1355  BP: 128/72  Weight: 170 lb (77.1 kg)  Height: 5\' 2"  (1.575 m)   PainSc: 0-No pain   Body mass index is 31.09 kg/m.     02/17/2024    1:38 PM 02/11/2023    9:40 AM 05/06/2022   12:20 PM 02/02/2022    9:08 AM 06/16/2021    9:04 AM 06/12/2021   11:13 AM 09/03/2020   12:24 PM  Advanced Directives  Does Patient Have a Medical Advance Directive? No No No No No No No  Would patient like information on creating a medical advance directive? No - Patient declined No - Patient declined No - Patient declined No - Patient declined No - Patient declined No - Patient declined No - Patient declined    Current Medications (verified) Outpatient Encounter Medications as of 02/17/2024  Medication Sig   acetaminophen (TYLENOL) 500 MG tablet Take 1,000 mg by mouth every 8 (eight) hours as needed for mild pain or moderate pain.   ALPRAZolam (XANAX) 0.25 MG tablet 1 twice daily as needed anxiety use sparingly caution drowsiness no driving with medicine   amLODipine (NORVASC) 5 MG tablet Take 1 tablet (5 mg total) by mouth daily.   DHA-Vitamin C-Lutein (EYE HEALTH FORMULA PO) Take 1 tablet by mouth daily.   diphenhydrAMINE (BENADRYL ALLERGY) 25 MG tablet Take 1 tablet (25 mg total) by mouth every 4 (four) hours. Take with hydrocodone   DULoxetine (CYMBALTA) 60 MG capsule Take 1 capsule (60 mg total) by mouth daily.   ezetimibe (ZETIA) 10 MG tablet Take 1 tablet (10 mg total) by mouth daily.   fexofenadine (ALLEGRA) 180 MG tablet Take 180 mg by mouth daily.   fluticasone (FLONASE) 50 MCG/ACT nasal spray Place 2  sprays into both nostrils daily.   hydrochlorothiazide (HYDRODIURIL) 25 MG tablet Take 1 tablet (25 mg total) by mouth daily.   HYDROcodone-acetaminophen (NORCO) 7.5-325 MG tablet 1 tid prn pain   HYDROcodone-acetaminophen (NORCO) 7.5-325 MG tablet One tid prn   HYDROcodone-acetaminophen (NORCO) 7.5-325 MG tablet One tid prn pain to last 30 day   oseltamivir (TAMIFLU) 75 MG capsule Take 1 capsule (75 mg total) by mouth every 12 (twelve) hours.   potassium chloride SA  (KLOR-CON M) 20 MEQ tablet Take 1 tablet (20 mEq total) by mouth daily.   pravastatin (PRAVACHOL) 20 MG tablet Take 1 tablet (20 mg total) by mouth daily.   promethazine-dextromethorphan (PROMETHAZINE-DM) 6.25-15 MG/5ML syrup Take 5 mLs by mouth 4 (four) times daily as needed.   Propylene Glycol (SYSTANE BALANCE) 0.6 % SOLN Place 1 drop into both eyes 2 (two) times daily.   No facility-administered encounter medications on file as of 02/17/2024.    Allergies (verified) Codeine, Oxycodone-acetaminophen, Sulfamethoxazole-trimethoprim, Celebrex [celecoxib], Hydrocodone-acetaminophen, Losartan, Lyrica [pregabalin], Mobic [meloxicam], and Tramadol   History: Past Medical History:  Diagnosis Date   Acid reflux    Anxiety    Arthritis    CHF (congestive heart failure) (HCC)    90's swelling body-no breathing problems at that time-no cardiac -in dr s Gerda Diss office [place] [facility] give lasix and potassium   Complication of anesthesia    History of being told she was allergic to something in 1980 no problems since   Depression    Fibromyalgia    Fibromyalgia 09/10/2019   History of blood transfusion reaction 1978   History of hiatal hernia    HNP (herniated nucleus pulposus)    Hypertension    Does not see a cardiologist no stress or echo   Joint pain    Nerve pain    right leg    Ruptured lumbar disc    Sleep apnea    Uses CPAP every night setting at 7 mm/Hg   Past Surgical History:  Procedure Laterality Date   ablation of nerves in back s1 and lumbar  05/21/2021   [place] nu[facility] Dr Murray Hodgkins   BACK SURGERY     x3   BLADDER REPAIR     tear during during birth of child   CARPAL TUNNEL RELEASE Bilateral    CESAREAN SECTION     CHOLECYSTECTOMY     gallbladder disease; stayed sick   COLONOSCOPY N/A 04/28/2016   Procedure: COLONOSCOPY;  Surgeon: Malissa Hippo, MD;  Location: [facility];  Service: Endoscopy;  Laterality: N/A;   ESOPHAGOGASTRODUODENOSCOPY N/A 04/28/2016   Procedure:  ESOPHAGOGASTRODUODENOSCOPY (EGD);  Surgeon: Malissa Hippo, MD;  Location: [facility];  Service: Endoscopy;  Laterality: N/A;  12:00   GALLBLADDER SURGERY     KNEE ARTHROSCOPY Right 06/16/2021   Procedure: ARTHROSCOPY KNEE WITH LIMITED DEBRIDEMENT;  Surgeon: Vickki Hearing, MD;  Location: [facility];  Service: Orthopedics;  Laterality: Right;   knee right (torn Cart)     REPLACEMENT TOTAL KNEE     left   RIGHT/LEFT HEART CATH AND CORONARY ANGIOGRAPHY N/A 01/23/2020   Procedure: RIGHT/LEFT HEART CATH AND CORONARY ANGIOGRAPHY;  Surgeon: Lennette Bihari, MD;  Location: [facility];  Service: Cardiovascular;  Laterality: N/A;   ROTATOR CUFF REPAIR Bilateral    right and left   SPINAL FUSION     TOTAL KNEE ARTHROPLASTY Right 12/29/2017   Procedure: TOTAL KNEE ARTHROPLASTY-right knee;  Surgeon: Vickki Hearing, MD;  Location: [facility];  Service: Orthopedics;  Laterality: Right;   TUBAL LIGATION     vein right leg removed     wrist ganglion cyst left     Family History  Problem Relation Age of Onset   Hypertension Father    Cancer Father        liposarcoma   Heart disease Mother    Cancer Other        family history    Diabetes Other        family history    Coronary artery disease Other        family history    Arthritis Other        family history    Asthma Other    Diabetes Brother    Heart disease Brother    Coronary artery disease Brother 7       CABD   Social History   Socioeconomic History   Marital status: Married    Spouse name: Renae Fickle   Number of children: 2   Years of education: college    Highest education level: Not on file  Occupational History   Occupation: nurse   Tobacco Use   Smoking status: Never   Smokeless tobacco: Never  Vaping Use   Vaping status: Never Used  Substance and Sexual Activity   Alcohol use: No   Drug use: No   Sexual activity: Never    Birth control/protection: Post-menopausal  Other Topics Concern   Not on file   Social History Narrative   2 daughters, 1 deceased.   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    Social Drivers of Corporate investment banker Strain: Low Risk  (02/17/2024)   Overall Financial Resource Strain (CARDIA)    Difficulty of Paying Living Expenses: Not hard at all  Food Insecurity: No Food Insecurity (02/17/2024)   Hunger Vital Sign    Worried About Running Out of Food in the Last Year: Never true    Ran Out of Food in the Last Year: Never true  Transportation Needs: No Transportation Needs (02/17/2024)   PRAPARE - Administrator, Civil Service (Medical): No    Lack of Transportation (Non-Medical): No  Physical Activity: Inactive (02/17/2024)   Exercise Vital Sign    Days of Exercise per Week: 0 days    Minutes of Exercise per Session: 0 min  Stress: No Stress Concern Present (02/17/2024)   [employer] of Occupational Health - Occupational Stress Questionnaire    Feeling of Stress : Only a little  Social Connections: Socially Integrated (02/17/2024)   Social Connection and Isolation Panel [NHANES]    Frequency of Communication with Friends and Family: More than three times a week    Frequency of Social Gatherings with Friends and Family: More than three times a week    Attends Religious Services: More than 4 times per year    Active Member of [employer] or Organizations: Yes    Attends Engineer, structural: More than 4 times per year    Marital Status: Married    Tobacco Counseling Counseling given: Yes    Clinical Intake:  Pre-visit preparation completed: Yes  Pain : No/denies pain Pain Score: 0-No pain     BMI - recorded: 31.09 Nutritional Status: BMI > 30  Obese Nutritional Risks: None Diabetes: No  How often do you need to have someone help you when you read instructions, pamphlets, or other written materials  from your doctor or pharmacy?: 1 - Never  Interpreter Needed?:  No  Information entered by :: Maryjean Ka CMA   Activities of Daily Living     02/17/2024    1:39 PM  In your present state of health, do you have any difficulty performing the following activities:  Hearing? 0  Vision? 0  Difficulty concentrating or making decisions? 0  Walking or climbing stairs? 0  Dressing or bathing? 0  Doing errands, shopping? 0  Preparing Food and eating ? N  Using the Toilet? N  In the past six months, have you accidently leaked urine? N  Do you have problems with loss of bowel control? N  Managing your Medications? N  Managing your Finances? N  Housekeeping or managing your Housekeeping? N    Patient Care Team: Babs Sciara, MD as PCP - General (Family Medicine) Pricilla Riffle, MD as PCP - Cardiology (Cardiology) [facility], [place], Jonny Ruiz, MD (Dermatology) Vickki Hearing, MD as Consulting Physician (Orthopedic Surgery) Smitty Cords, OD (Optometry)  Indicate any recent Medical Services you may have received from other than [facility] providers in the past year (date may be approximate).     Assessment:   This is a routine wellness examination for Emily [name].  Hearing/Vision screen Hearing Screening - Comments:: Patient denies any hearing difficulties.   Vision Screening - Comments:: Patient is having difficulty seeing. Has an appt to see Dr. Daphine Deutscher @ [facility] [place] location   Goals Addressed             This Visit's Progress    Patient Stated       Lose weight       Depression Screen     02/17/2024    1:53 PM 01/30/2024    9:06 AM 12/19/2023    2:55 PM 09/23/2023    9:30 AM 06/16/2023    9:00 AM 03/17/2023    8:24 AM 02/11/2023    9:39 AM  PHQ 2/9 Scores  PHQ - 2 Score 2 2 4 4 2 1 2   PHQ- 9 Score 8 10 17 17 16 10 15     Fall Risk     02/17/2024    1:38 PM 01/30/2024    9:06 AM 12/19/2023    2:55 PM 09/23/2023    9:30 AM 03/17/2023    8:24 AM  Fall Risk   Falls in the past year? 0 0 0 0 0  Number falls  in past yr: 0   0 0  Injury with Fall? 0   0 0  Risk for fall due to : No Fall Risks      Follow up Falls prevention discussed;Falls evaluation completed        MEDICARE RISK AT HOME:  Medicare Risk at Home Any stairs in or around the home?: Yes If so, are there any without handrails?: No Home free of loose throw rugs in walkways, pet beds, electrical cords, etc?: Yes Adequate lighting in your home to reduce risk of falls?: Yes Life alert?: No Use of a cane, walker or w/c?: No Grab bars in the bathroom?: No Shower chair or bench in shower?: Yes Elevated toilet seat or a handicapped toilet?: Yes  TIMED UP AND GO:  Was the test performed?  No  Cognitive Function: 6CIT completed        02/17/2024    1:56 PM 02/11/2023    9:41 AM 02/02/2022    9:12 AM  6CIT Screen  What Year? 0 points 0 points 0 points  What month? 0 points 0 points 0 points  What time? 0 points 0 points 0 points  Count back from 20 0 points 0 points 0 points  Months in reverse 0 points 0 points 0 points  Repeat phrase 0 points 0 points 0 points  Total Score 0 points 0 points 0 points    Immunizations Immunization History  Administered Date(s) Administered   Fluad Quad(high Dose 65+) 10/22/2020, 10/26/2021, 11/14/2022   Fluad Trivalent(High Dose 65+) 12/19/2023   Hepatitis B 06/13/2003, 07/16/2003, 12/16/2003   Influenza Split 10/15/2013   Influenza,inj,Quad PF,6+ Mos 11/12/2014, 11/04/2015, 11/08/2016, 11/02/2017, 10/11/2018   Influenza-Unspecified 10/17/2019   Moderna Sars-Covid-2 Vaccination 02/05/2020, 03/04/2020, 11/10/2020   Pneumococcal Conjugate-13 10/27/2019   Pneumococcal Polysaccharide-23 12/30/2017   Td 01/03/2019   Zoster Recombinant(Shingrix) 11/14/2022, 02/10/2023    Screening Tests Health Maintenance  Topic Date Due   Colonoscopy  04/28/2021   COVID-19 Vaccine (4 - 2024-25 season) 08/07/2023   DEXA SCAN  09/21/2024   Medicare Annual Wellness (AWV)  02/16/2025   MAMMOGRAM   10/02/2025   DTaP/Tdap/Td (2 - Tdap) 01/03/2029   Pneumonia Vaccine 28+ Years old  Completed   INFLUENZA VACCINE  Completed   Hepatitis C Screening  Completed   Zoster Vaccines- Shingrix  Completed   HPV VACCINES  Aged Out    Health Maintenance  Health Maintenance Due  Topic Date Due   Colonoscopy  04/28/2021   COVID-19 Vaccine (4 - 2024-25 season) 08/07/2023   Health Maintenance Items Addressed: Patient stated she already has the paperwork for GI to call and set up an appointment or colonoscopy  Additional Screening:  Vision Screening: Recommended annual ophthalmology exams for early detection of glaucoma and other disorders of the eye.  Dental Screening: Recommended annual dental exams for proper oral hygiene  Community Resource Referral / Chronic Care Management: CRR required this visit?  No   CCM required this visit?  No     Plan:     I have personally reviewed and noted the following in the patient's chart:   Medical and social history Use of alcohol, tobacco or illicit drugs  Current medications and supplements including opioid prescriptions. Patient is not currently taking opioid prescriptions. Functional ability and status Nutritional status Physical activity Advanced directives List of other physicians Hospitalizations, surgeries, and ER visits in previous 12 months Vitals Screenings to include cognitive, depression, and falls Referrals and appointments  In addition, I have reviewed and discussed with patient certain preventive protocols, quality metrics, and best practice recommendations. A written personalized care plan for preventive services as well as general preventive health recommendations were provided to patient.     Jordan Hawks Korey Prashad, CMA   02/17/2024   After Visit Summary: (Mail) Due to this being a telephonic visit, the after visit summary with patients personalized plan was offered to patient via mail   Notes: Nothing significant to  report at this time.

## 2024-02-17 NOTE — Patient Instructions (Signed)
 [name] , Thank you for taking time to come for your Medicare Wellness Visit. I appreciate your ongoing commitment to your health goals. Please review the following plan we discussed and let me know if I can assist you in the future.   Referrals/Orders/Follow-Ups/Clinician Recommendations:  Next Medicare Annual Wellness Visit:   February 22, 2025 at 1:00 pm video visit.   This is a list of the screening recommended for you and due dates:  Health Maintenance  Topic Date Due   Colon Cancer Screening  04/28/2021   COVID-19 Vaccine (4 - 2024-25 season) 08/07/2023   DEXA scan (bone density measurement)  09/21/2024   Medicare Annual Wellness Visit  02/16/2025   Mammogram  10/02/2025   DTaP/Tdap/Td vaccine (2 - Tdap) 01/03/2029   Pneumonia Vaccine  Completed   Flu Shot  Completed   Hepatitis C Screening  Completed   Zoster (Shingles) Vaccine  Completed   HPV Vaccine  Aged Out    Advanced directives: (ACP Link)Information on Advanced Care Planning can be found at [facility] of [place] Advance Health Care Directives Advance Health Care Directives. [email or web address]   Next Medicare Annual Wellness Visit scheduled for next year: yes  Understanding Your Risk for Falls Millions of people have serious injuries from falls each year. It is important to understand your risk of falling. Talk with your health care provider about your risk and what you can do to lower it. If you do have a serious fall, make sure to tell your provider. Falling once raises your risk of falling again. How can falls affect me? Serious injuries from falls are common. These include: Broken bones, such as hip fractures. Head injuries, such as traumatic brain injuries (TBI) or concussions. A fear of falling can cause you to avoid activities and stay at home. This can make your muscles weaker and raise your risk for a fall. What can increase my risk? There are a number of risk factors that increase your risk for falling.  The more risk factors you have, the higher your risk of falling. Serious injuries from a fall happen most often to people who are older than 73 years old. Teenagers and young adults ages 46-29 are also at higher risk. Common risk factors include: Weakness in the lower body. Being generally weak or confused due to long-term (chronic) illness. Dizziness or balance problems. Poor vision. Medicines that cause dizziness or drowsiness. These may include: Medicines for your blood pressure, heart, anxiety, insomnia, or swelling (edema). Pain medicines. Muscle relaxants. Other risk factors include: Drinking alcohol. Having had a fall in the past. Having foot pain or wearing improper footwear. Working at a dangerous job. Having any of the following in your home: Tripping hazards, such as floor clutter or loose rugs. Poor lighting. Pets. Having dementia or memory loss. What actions can I take to lower my risk of falling?     Physical activity Stay physically fit. Do strength and balance exercises. Consider taking a regular class to build strength and balance. Yoga and tai chi are good options. Vision Have your eyes checked every year and your prescription for glasses or contacts updated as needed. Shoes and walking aids Wear non-skid shoes. Wear shoes that have rubber soles and low heels. Do not wear high heels. Do not walk around the house in socks or slippers. Use a cane or walker as told by your provider. Home safety Attach secure railings on both sides of your stairs. Install grab bars for your bathtub, shower,  and toilet. Use a non-skid mat in your bathtub or shower. Attach bath mats securely with double-sided, non-slip rug tape. Use good lighting in all rooms. Keep a flashlight near your bed. Make sure there is a clear path from your bed to the bathroom. Use night-lights. Do not use throw rugs. Make sure all carpeting is taped or tacked down securely. Remove all clutter from  walkways and stairways, including extension cords. Repair uneven or broken steps and floors. Avoid walking on icy or slippery surfaces. Walk on the grass instead of on icy or slick sidewalks. Use ice melter to get rid of ice on walkways in the winter. Use a cordless phone. Questions to ask your health care provider Can you help me check my risk for a fall? Do any of my medicines make me more likely to fall? Should I take a vitamin D supplement? What exercises can I do to improve my strength and balance? Should I make an appointment to have my vision checked? Do I need a bone density test to check for weak bones (osteoporosis)? Would it help to use a cane or a walker? Where to find more information Centers for Disease Control and Prevention, STEADI: TonerPromos.no Community-Based Fall Prevention Programs: TonerPromos.no [employer] on Aging: BaseRingTones.pl Contact a health care provider if: You fall at home. You are afraid of falling at home. You feel weak, drowsy, or dizzy. This information is not intended to replace advice given to you by your health care provider. Make sure you discuss any questions you have with your health care provider. Document Revised: 07/26/2022 Document Reviewed: 07/26/2022 Elsevier Patient Education  2024 [employer].

## 2024-03-12 DIAGNOSIS — Z79899 Other long term (current) drug therapy: Secondary | ICD-10-CM | POA: Diagnosis not present

## 2024-03-12 DIAGNOSIS — E781 Pure hyperglyceridemia: Secondary | ICD-10-CM | POA: Diagnosis not present

## 2024-03-12 DIAGNOSIS — E785 Hyperlipidemia, unspecified: Secondary | ICD-10-CM | POA: Diagnosis not present

## 2024-03-12 DIAGNOSIS — I10 Essential (primary) hypertension: Secondary | ICD-10-CM | POA: Diagnosis not present

## 2024-03-14 ENCOUNTER — Encounter: Payer: Self-pay | Admitting: Family Medicine

## 2024-03-14 LAB — LIPID PANEL
Chol/HDL Ratio: 4.4 ratio (ref 0.0–4.4)
Cholesterol, Total: 158 mg/dL (ref 100–199)
HDL: 36 mg/dL — ABNORMAL LOW (ref >39)
LDL Chol Calc (NIH): 64 mg/dL (ref 0–99)
Triglycerides: 376 mg/dL — ABNORMAL HIGH (ref 0–149)
VLDL Cholesterol Cal: 58 mg/dL — ABNORMAL HIGH (ref 5–40)

## 2024-03-14 LAB — NMR, LIPOPROFILE
Cholesterol, Total: 155 mg/dL (ref 100–199)
HDL Particle Number: 35.1 umol/L (ref >=30.5)
HDL-C: 33 mg/dL — ABNORMAL LOW (ref >39)
LDL Particle Number: 842 nmol/L (ref <1000)
LDL Size: 19.7 nm — ABNORMAL LOW (ref >20.5)
LDL-C (NIH Calc): 65 mg/dL (ref 0–99)
LP-IR Score: 69 — ABNORMAL HIGH (ref <=45)
Small LDL Particle Number: 652 nmol/L — ABNORMAL HIGH (ref <=527)
Triglycerides: 366 mg/dL — ABNORMAL HIGH (ref 0–149)

## 2024-03-14 LAB — TSH: TSH: 0.984 u[IU]/mL (ref 0.450–4.500)

## 2024-03-14 LAB — BASIC METABOLIC PANEL WITH GFR
BUN/Creatinine Ratio: 16 (ref 12–28)
BUN: 10 mg/dL (ref 8–27)
CO2: 21 mmol/L (ref 20–29)
Calcium: 9.4 mg/dL (ref 8.7–10.3)
Chloride: 103 mmol/L (ref 96–106)
Creatinine, Ser: 0.64 mg/dL (ref 0.57–1.00)
Glucose: 101 mg/dL — ABNORMAL HIGH (ref 70–99)
Potassium: 4 mmol/L (ref 3.5–5.2)
Sodium: 142 mmol/L (ref 134–144)
eGFR: 94 mL/min/{1.73_m2} (ref >59)

## 2024-03-14 LAB — HEPATIC FUNCTION PANEL
ALT: 19 IU/L (ref 0–32)
AST: 22 IU/L (ref 0–40)
Albumin: 4.4 g/dL (ref 3.8–4.8)
Alkaline Phosphatase: 76 IU/L (ref 44–121)
Bilirubin Total: 0.3 mg/dL (ref 0.0–1.2)
Bilirubin, Direct: 0.09 mg/dL (ref 0.00–0.40)
Total Protein: 7 g/dL (ref 6.0–8.5)

## 2024-03-14 LAB — MICROALBUMIN / CREATININE URINE RATIO
Creatinine, Urine: 78.1 mg/dL
Microalb/Creat Ratio: 11 mg/g{creat} (ref 0–29)
Microalbumin, Urine: 8.5 ug/mL

## 2024-03-14 LAB — LIPOPROTEIN A (LPA): Lipoprotein (a): 52.1 nmol/L (ref <75.0)

## 2024-03-14 LAB — APOLIPOPROTEIN B: Apolipoprotein B: 82 mg/dL (ref <90)

## 2024-03-14 LAB — HEMOGLOBIN A1C
Est. average glucose Bld gHb Est-mCnc: 117 mg/dL
Hgb A1c MFr Bld: 5.7 % — ABNORMAL HIGH (ref 4.8–5.6)

## 2024-03-23 ENCOUNTER — Ambulatory Visit (INDEPENDENT_AMBULATORY_CARE_PROVIDER_SITE_OTHER): Payer: PPO | Admitting: Family Medicine

## 2024-03-23 VITALS — BP 118/73 | Ht 62.0 in | Wt 178.0 lb

## 2024-03-23 DIAGNOSIS — M7918 Myalgia, other site: Secondary | ICD-10-CM | POA: Diagnosis not present

## 2024-03-23 DIAGNOSIS — R52 Pain, unspecified: Secondary | ICD-10-CM | POA: Diagnosis not present

## 2024-03-23 DIAGNOSIS — I10 Essential (primary) hypertension: Secondary | ICD-10-CM | POA: Diagnosis not present

## 2024-03-23 DIAGNOSIS — R7303 Prediabetes: Secondary | ICD-10-CM | POA: Diagnosis not present

## 2024-03-23 DIAGNOSIS — E785 Hyperlipidemia, unspecified: Secondary | ICD-10-CM | POA: Diagnosis not present

## 2024-03-23 MED ORDER — HYDROCODONE-ACETAMINOPHEN 7.5-325 MG PO TABS: ORAL_TABLET | ORAL | 0 refills | Status: DC

## 2024-03-23 NOTE — Progress Notes (Signed)
 Subjective:    Patient ID: Emily [name], female    DOB: [birth year], 73 y.o.   MRN: [id number]  HPI This patient was seen today for chronic pain  The medication list was reviewed and updated.   Location of Pain for which the patient has been treated with regarding narcotics: Lumbar pain  Onset of this pain: Present for years   -Compliance with medication: Good compliance  - Number patient states they take daily: Some days takes to some days takes 3  -Reason for ongoing use of opioids does not get enough relief with NSAIDs  What other measures have been tried outside of opioids physical therapy, surgery, injections  In the ongoing specialists regarding this condition has seen back specialist before earlier  -when was the last dose patient took?  Earlier today  The patient was advised the importance of maintaining medication and not using illegal substances with these.  Here for refills and follow up  The patient was educated that we can provide 3 monthly scripts for their medication, it is their responsibility to follow the instructions.  Side effects or complications from medications: No side effects  Patient is aware that pain medications are meant to minimize the severity of the pain to allow their pain levels to improve to allow for better function. They are aware of that pain medications cannot totally remove their pain.  Due for UDT ( at least once per year) (pain management contract is also completed at the time of the UDT): Will be due in the summertime  Scale of 1 to 10 ( 1 is least 10 is most) Your pain level without the medicine: 8 Your pain level with medication 5  Scale 1 to 10 ( 1-helps very little, 10 helps very well) How well does your pain medication reduce your pain so you can function better through out the day?  8  Quality of the pain: Throbbing aching  Persistence of the pain: Present all the time  Modifying factors: Worse with  activity  Recent lab work showed mild elevation in triglycerides slight elevation in A1c we did discuss the importance of healthy diet      Review of Systems     Objective:   Physical Exam General-in no acute distress Eyes-no discharge Lungs-respiratory rate normal, CTA CV-no murmurs,RRR Extremities skin warm dry no edema Neuro grossly normal Behavior normal, alert        Assessment & Plan:  1. Encounter for pain management (Primary) The patient was seen in followup for chronic pain. A review over at their current pain status was discussed. Drug registry was checked. Prescriptions were given.  Regular follow-up recommended. Discussion was held regarding the importance of compliance with medication as well as pain medication contract.  Patient was informed that medication may cause drowsiness and should not be combined  with other medications/alcohol or street drugs. If the patient feels medication is causing altered alertness then do not drive or operate dangerous equipment.  Should be noted that the patient appears to be meeting appropriate use of opioids and response.  Evidenced by improved function and decent pain control without significant side effects and no evidence of overt aberrancy issues.  Upon discussion with the patient today they understand that opioid therapy is optional and they feel that the pain has been refractory to reasonable conservative measures and is significant and affecting quality of life enough to warrant ongoing therapy and wishes to continue opioids.  Refills were provided.  [place]   medical Board guidelines regarding the pain medicine has been reviewed.  CDC guidelines most updated 2022 has been reviewed by the prescriber.  PDMP is checked on a regular basis yearly urine drug screen and pain management contract  Treatment plan for this patient includes #1-gentle stretching exercises as shown daily basis 2.  Mild strength exercises 3 times  per week #3 continue pain medications #4 notify us  if any digression  Urine drug screen on next visit  2. Hyperlipidemia, unspecified hyperlipidemia type Lab work was reviewed patient on statin meeting goals healthy diet recommended continue meds  3. HTN (hypertension), benign Continue meds BP looks good  4. Prediabetes Minimize starches in diet  5. Rhomboid pain Stretching exercises recommended

## 2024-03-27 ENCOUNTER — Other Ambulatory Visit: Payer: Self-pay | Admitting: Family Medicine

## 2024-03-29 ENCOUNTER — Other Ambulatory Visit: Payer: Self-pay | Admitting: Family Medicine

## 2024-04-27 ENCOUNTER — Other Ambulatory Visit (INDEPENDENT_AMBULATORY_CARE_PROVIDER_SITE_OTHER): Payer: Self-pay

## 2024-04-27 ENCOUNTER — Ambulatory Visit: Admitting: Orthopedic Surgery

## 2024-04-27 DIAGNOSIS — S6991XA Unspecified injury of right wrist, hand and finger(s), initial encounter: Secondary | ICD-10-CM

## 2024-04-27 DIAGNOSIS — M654 Radial styloid tenosynovitis [de Quervain]: Secondary | ICD-10-CM

## 2024-04-27 DIAGNOSIS — S43491A Other sprain of right shoulder joint, initial encounter: Secondary | ICD-10-CM | POA: Diagnosis not present

## 2024-04-27 MED ORDER — METHYLPREDNISOLONE ACETATE 40 MG/ML IJ SUSP
40.0000 mg | Freq: Once | INTRAMUSCULAR | Status: AC
  Administered 2024-04-27: 40 mg via INTRA_ARTICULAR

## 2024-04-27 NOTE — Progress Notes (Signed)
 Right wrist pain- contines to hurt.   2 weeks ago used wheel barrow and jerked the wrist and felt bad pain like had broken it.   Taking tylenol  and hydrocodone , ice and brace

## 2024-04-27 NOTE — Patient Instructions (Signed)
 You have received an injection of steroids into the joint. 15% of patients will have increased pain within the 24 hours postinjection.   This is transient and will go away.   We recommend that you use ice packs on the injection site for 20 minutes every 2 hours and extra strength Tylenol 2 tablets every 8 as needed until the pain resolves.  If you continue to have pain after taking the Tylenol and using the ice please call the office for further instructions.

## 2024-04-27 NOTE — Progress Notes (Signed)
   Chief Complaint  Patient presents with   Wrist Pain    Right wrist pain-    Right wrist pain- contines to hurt.   2 weeks ago used wheel barrow and jerked the wrist and felt bad pain like had broken it.   Taking tylenol  and hydrocodone , ice and brace  Examination of the right wrist shows that there is a bruise over the dorsum of the right wrist and this area is tender.  It is proximal to the wrist joint itself and is not affected by flexion extension of the wrist.  However with pronation supination she feels more pain over the area  She has underlying CMC arthritis and de Quervain's syndrome which we have treated in the past  These areas are also tender  Neurovascular exam is normal  DG Wrist Complete Right Result Date: 04/27/2024 Right wrist trauma right wrist x-ray Mild arthritis is seen in the wrist joint near the scaphoid and radial styloid.  Also see arthritis at the [facility] joint. No acute fracture OA as stated     Assessment and plan and differential diagnosis  73 year old female acute pain after emptying a wheelbarrow, she has underlying CMC arthritis and Decore vein syndrome  Encounter Diagnoses  Name Primary?   Injury, wrist, right, initial encounter Yes   De Quervain's disease (radial styloid tenosynovitis)    Sprain of other part of right shoulder region, initial encounter     I think her wrist sprain will be fine by itself with time  She opted for injection of the DeQ syndrome  OA of the [facility] joint will be observed for now  She also asked about her shoulder.  She had a right rotator cuff repair years ago done elsewhere she says she also hurt the shoulder then.  I reexamined her shoulder and found her to have excellent strength in abduction and flexion with full range of motion and some discomfort in the posterior deltoid area in certain positions  DG Wrist Complete Right Result Date: 04/27/2024 Right wrist trauma right wrist x-ray Mild arthritis is seen in the  wrist joint near the scaphoid and radial styloid.  Also see arthritis at the [facility] joint. No acute fracture OA as stated     Procedure note injection RIGHT wrist for de Quervain's syndrome  The patient has consented for injection of THE RIGHT wrist for de Quervain's syndrome 40 mg of Depo-Medrol  1 mL, 3 mL 1% lidocaine . Patient gave verbal consent timeout to confirm site of injection  Sterile technique ethyl chloride used for skin prep  No complications   She will return on an as-needed basis

## 2024-05-13 ENCOUNTER — Other Ambulatory Visit: Payer: Self-pay | Admitting: Family Medicine

## 2024-05-15 ENCOUNTER — Telehealth: Payer: Self-pay | Admitting: Family Medicine

## 2024-05-15 NOTE — Telephone Encounter (Signed)
 This patient is on hydrocodone  This is a controlled medicine According to my calculations she would be due for a pain management visit in July Also according to epic she does not have an appointment for July Please connect with patient if her intentions is to continue getting prescriptions for hydrocodone  we need to set up a visit in July with me thank you

## 2024-05-17 ENCOUNTER — Encounter: Payer: Self-pay | Admitting: *Deleted

## 2024-05-28 ENCOUNTER — Other Ambulatory Visit: Payer: Self-pay | Admitting: Family Medicine

## 2024-05-31 DIAGNOSIS — G4733 Obstructive sleep apnea (adult) (pediatric): Secondary | ICD-10-CM | POA: Diagnosis not present

## 2024-06-03 ENCOUNTER — Other Ambulatory Visit: Payer: Self-pay | Admitting: Family Medicine

## 2024-06-11 ENCOUNTER — Ambulatory Visit: Payer: PPO | Admitting: Orthopedic Surgery

## 2024-06-23 ENCOUNTER — Other Ambulatory Visit: Payer: Self-pay | Admitting: Family Medicine

## 2024-06-25 ENCOUNTER — Other Ambulatory Visit: Payer: Self-pay

## 2024-06-25 MED ORDER — EZETIMIBE 10 MG PO TABS: 10.0000 mg | ORAL_TABLET | Freq: Every day | ORAL | 0 refills | Status: DC

## 2024-07-01 ENCOUNTER — Other Ambulatory Visit: Payer: Self-pay | Admitting: Family Medicine

## 2024-07-02 ENCOUNTER — Other Ambulatory Visit: Payer: Self-pay

## 2024-07-02 MED ORDER — EZETIMIBE 10 MG PO TABS: 10.0000 mg | ORAL_TABLET | Freq: Every day | ORAL | 3 refills | Status: AC

## 2024-07-13 ENCOUNTER — Ambulatory Visit: Admitting: Family Medicine

## 2024-08-15 ENCOUNTER — Encounter: Payer: Self-pay | Admitting: Family Medicine

## 2024-08-15 ENCOUNTER — Ambulatory Visit: Admitting: Family Medicine

## 2024-08-15 ENCOUNTER — Ambulatory Visit: Admitting: Physician Assistant

## 2024-08-15 VITALS — BP 122/79 | HR 86 | Temp 97.2°F | Ht 62.0 in | Wt 178.0 lb

## 2024-08-15 DIAGNOSIS — R52 Pain, unspecified: Secondary | ICD-10-CM

## 2024-08-15 DIAGNOSIS — R7303 Prediabetes: Secondary | ICD-10-CM | POA: Diagnosis not present

## 2024-08-15 DIAGNOSIS — Z79899 Other long term (current) drug therapy: Secondary | ICD-10-CM

## 2024-08-15 DIAGNOSIS — I10 Essential (primary) hypertension: Secondary | ICD-10-CM | POA: Diagnosis not present

## 2024-08-15 DIAGNOSIS — E785 Hyperlipidemia, unspecified: Secondary | ICD-10-CM | POA: Diagnosis not present

## 2024-08-15 NOTE — Progress Notes (Signed)
 Subjective:    Patient ID: Emily [name], female    DOB: [birth year], 73 y.o.   MRN: [id number]  HPI This patient was seen today for chronic pain  The medication list was reviewed and updated.   Location of Pain for which the patient has been treated with regarding narcotics: Lumbar pain with sciatica  Onset of this pain: Present for years   -Compliance with medication: Good compliance  - Number patient states they take daily: Most days 2 tablets occasionally 3  -Reason for ongoing use of opioids does not get adequate relief with Tylenol  or NSAIDs or other measures  What other measures have been tried outside of opioids in addition to all of the above surgery physical therapy  In the ongoing specialists regarding this condition was seen back specialist none currently  -when was the last dose patient took?  Within the past 24 hours  The patient was advised the importance of maintaining medication and not using illegal substances with these.  Here for refills and follow up  The patient was educated that we can provide 3 monthly scripts for their medication, it is their responsibility to follow the instructions.  Side effects or complications from medications: Denies side effects  Patient is aware that pain medications are meant to minimize the severity of the pain to allow their pain levels to improve to allow for better function. They are aware of that pain medications cannot totally remove their pain.  Due for UDT ( at least once per year) (pain management contract is also completed at the time of the UDT): Today  Scale of 1 to 10 ( 1 is least 10 is most) Your pain level without the medicine: 8-9 Your pain level with medication 3-4  Scale 1 to 10 ( 1-helps very little, 10 helps very well) How well does your pain medication reduce your pain so you can function better through out the day?  7-8  Quality of the pain: Throbbing aching  Persistence of the pain: Present all  time  Modifying factors: Worse with activity  Encounter for pain management - Plan: ToxASSURE Select 13 (MW), Urine  Hyperlipidemia, unspecified hyperlipidemia type  HTN (hypertension), benign  Prediabetes  High risk medication use - Plan: ToxASSURE Select 13 (MW), Urine  Patient for blood pressure check up.  The patient does have hypertension.   Patient relates dietary measures try to minimize salt The importance of healthy diet and activity were discussed Patient relates compliance  Patient here for follow-up regarding cholesterol.    Patient relates taking medication on a regular basis Denies problems with medication Importance of dietary measures discussed Regular lab work regarding lipid and liver was checked and if needing additional labs was appropriately ordered  Prediabetes watch his diet       Review of Systems     Objective:   Physical Exam General-in no acute distress Eyes-no discharge Lungs-respiratory rate normal, CTA CV-no murmurs,RRR Extremities skin warm dry no edema Neuro grossly normal Behavior normal, alert        Assessment & Plan:  1. Encounter for pain management (Primary) The patient was seen in followup for chronic pain. A review over at their current pain status was discussed. Drug registry was checked. Prescriptions were given.  Regular follow-up recommended. Discussion was held regarding the importance of compliance with medication as well as pain medication contract.  Patient was informed that medication may cause drowsiness and should not be combined  with other medications/alcohol or street  drugs. If the patient feels medication is causing altered alertness then do not drive or operate dangerous equipment.  Should be noted that the patient appears to be meeting appropriate use of opioids and response.  Evidenced by improved function and decent pain control without significant side effects and no evidence of overt aberrancy  issues.  Upon discussion with the patient today they understand that opioid therapy is optional and they feel that the pain has been refractory to reasonable conservative measures and is significant and affecting quality of life enough to warrant ongoing therapy and wishes to continue opioids.  Refills were provided.  [place]  medical Board guidelines regarding the pain medicine has been reviewed.  CDC guidelines most updated 2022 has been reviewed by the prescriber.  PDMP is checked on a regular basis yearly urine drug screen and pain management contract  Treatment plan for this patient includes #1-gentle stretching exercises as shown daily basis 2.  Mild strength exercises 3 times per week #3 continue pain medications #4 notify us  if any digression  Will update her pain meds  Urine drug screen today  2. Hyperlipidemia, unspecified hyperlipidemia type Continue current treatment watch diet stay active lab work in the spring  3. HTN (hypertension), benign Blood pressure good control continue current measures  4. Prediabetes Previous A1c looks good portion control regular physical activity  Follow-up 3 months

## 2024-08-18 LAB — TOXASSURE SELECT 13 (MW), URINE

## 2024-08-20 ENCOUNTER — Ambulatory Visit: Payer: Self-pay | Admitting: Family Medicine

## 2024-08-26 ENCOUNTER — Other Ambulatory Visit: Payer: Self-pay | Admitting: Family Medicine

## 2024-08-30 NOTE — Telephone Encounter (Signed)
 Copied from CRM [id number]. Topic: Clinical - Medication Refill >> Aug 30, 2024  2:46 PM Delon HERO wrote: Medication: HYDROcodone -acetaminophen  (NORCO) 7.5-325 MG tablet [[id number]]   hydrochlorothiazide  (HYDRODIURIL ) 25 MG tablet [[id number]] DULoxetine  (CYMBALTA ) 60 MG capsule [[id number]] potassium chloride  SA (KLOR-CON ) 20 MEQ tablet [[id number]] DISCONTINUED  Patient was sent 08/15/2024 advised that all of her medications would be refilled   Has the patient contacted their pharmacy? Yes (Agent: If no, request that the patient contact the pharmacy for the refill. If patient does not wish to contact the pharmacy document the reason why and proceed with request.) (Agent: If yes, when and what did the pharmacy advise?)  This is the patient's preferred pharmacy:    [facility] [address] [place] - [address] [place] [address] Phone: [phone] Fax: [phone]  Phone: [phone] Fax: [phone]  Is this the correct pharmacy for this prescription? Yes If no, delete pharmacy and type the correct one.   Has the prescription been filled recently? Yes  Is the patient out of the medication? Yes  Has the patient been seen for an appointment in the last year OR does the patient have an upcoming appointment? Yes  Can we respond through MyChart? Yes  Agent: Please be advised that Rx refills may take up to 3 business days. We ask that you follow-up with your pharmacy.

## 2024-09-05 ENCOUNTER — Other Ambulatory Visit: Payer: Self-pay | Admitting: Family Medicine

## 2024-09-05 ENCOUNTER — Other Ambulatory Visit: Payer: Self-pay

## 2024-09-05 ENCOUNTER — Encounter: Payer: Self-pay | Admitting: Family Medicine

## 2024-09-05 MED ORDER — HYDROCODONE-ACETAMINOPHEN 7.5-325 MG PO TABS: ORAL_TABLET | ORAL | 0 refills | Status: DC

## 2024-09-05 MED ORDER — POTASSIUM CHLORIDE CRYS ER 20 MEQ PO TBCR: 20.0000 meq | EXTENDED_RELEASE_TABLET | Freq: Every day | ORAL | 1 refills | Status: AC

## 2024-09-05 MED ORDER — HYDROCHLOROTHIAZIDE 25 MG PO TABS: 25.0000 mg | ORAL_TABLET | Freq: Every day | ORAL | 1 refills | Status: AC

## 2024-09-05 NOTE — Telephone Encounter (Signed)
 Pt has called again (original call on 09/21) needing her refills. Please see attached refill request and get them refilled asap.

## 2024-09-09 ENCOUNTER — Telehealth: Payer: Self-pay | Admitting: Family Medicine

## 2024-09-09 DIAGNOSIS — E785 Hyperlipidemia, unspecified: Secondary | ICD-10-CM

## 2024-09-09 DIAGNOSIS — I10 Essential (primary) hypertension: Secondary | ICD-10-CM

## 2024-09-09 DIAGNOSIS — Z79899 Other long term (current) drug therapy: Secondary | ICD-10-CM

## 2024-09-09 DIAGNOSIS — R7303 Prediabetes: Secondary | ICD-10-CM

## 2024-09-09 NOTE — Telephone Encounter (Signed)
 Please order lipid, metabolic 7 Hyperlipidemia hypertension Patient is aware to get these completed before her follow-up office visit in December thank you

## 2024-09-10 NOTE — Addendum Note (Signed)
 Addended by: DELORES LIONEL RAMAN on: 09/10/2024 02:46 PM   Modules accepted: Orders

## 2024-09-10 NOTE — Telephone Encounter (Signed)
 Blood work ordered in [employer]. Patient is aware

## 2024-09-27 ENCOUNTER — Other Ambulatory Visit: Payer: Self-pay | Admitting: Family Medicine

## 2024-10-08 ENCOUNTER — Other Ambulatory Visit: Payer: Self-pay | Admitting: Family Medicine

## 2024-10-08 MED ORDER — HYDROCODONE-ACETAMINOPHEN 7.5-325 MG PO TABS: ORAL_TABLET | ORAL | 0 refills | Status: DC

## 2024-10-08 NOTE — Telephone Encounter (Signed)
 Copied from CRM [id number]. Topic: Clinical - Medication Refill >> Oct 08, 2024  9:56 AM Myrick T wrote: Medication: HYDROcodone -acetaminophen  (NORCO) 7.5-325 MG tablet  Has the patient contacted their pharmacy? Yes  This is the patient's preferred pharmacy:  [facility] [address] [place] - [address] [place] [address] [place] [address] Phone: [phone] Fax: [phone]  Is this the correct pharmacy for this prescription? Yes  Has the prescription been filled recently? Yes  Is the patient out of the medication? No  Has the patient been seen for an appointment in the last year OR does the patient have an upcoming appointment? Yes  Can we respond through MyChart? No  Agent: Please be advised that Rx refills may take up to 3 business days. We ask that you follow-up with your pharmacy.

## 2024-11-08 ENCOUNTER — Other Ambulatory Visit: Payer: Self-pay | Admitting: Family Medicine

## 2024-11-08 MED ORDER — HYDROCODONE-ACETAMINOPHEN 7.5-325 MG PO TABS: ORAL_TABLET | ORAL | 0 refills | Status: DC

## 2024-11-08 NOTE — Telephone Encounter (Signed)
 Copied from CRM [id number]. Topic: Clinical - Medication Refill >> Nov 08, 2024 10:02 AM Hadassah PARAS wrote: Medication: HYDROcodone -acetaminophen  (NORCO) 7.5-325 MG table  Has the patient contacted their pharmacy? Yes (Agent: If no, request that the patient contact the pharmacy for the refill. If patient does not wish to contact the pharmacy document the reason why and proceed with request.) (Agent: If yes, when and what did the pharmacy advise?)  This is the patient's preferred pharmacy:  [facility] [address], [place] - [address] [place] [place] [address] Phone: [phone] Fax: [phone]    Is this the correct pharmacy for this prescription? Yes If no, delete pharmacy and type the correct one.   Has the prescription been filled recently? Yes  Is the patient out of the medication? No  Has the patient been seen for an appointment in the last year OR does the patient have an upcoming appointment? Yes  Can we respond through MyChart? No  Agent: Please be advised that Rx refills may take up to 3 business days. We ask that you follow-up with your pharmacy.

## 2024-11-14 ENCOUNTER — Encounter: Payer: Self-pay | Admitting: Family Medicine

## 2024-11-14 ENCOUNTER — Ambulatory Visit (INDEPENDENT_AMBULATORY_CARE_PROVIDER_SITE_OTHER): Admitting: Family Medicine

## 2024-11-14 VITALS — BP 137/71 | HR 112 | Temp 97.7°F | Ht 62.0 in | Wt 174.0 lb

## 2024-11-14 DIAGNOSIS — M791 Myalgia, unspecified site: Secondary | ICD-10-CM

## 2024-11-14 DIAGNOSIS — Z79899 Other long term (current) drug therapy: Secondary | ICD-10-CM

## 2024-11-14 DIAGNOSIS — R7303 Prediabetes: Secondary | ICD-10-CM

## 2024-11-14 DIAGNOSIS — E785 Hyperlipidemia, unspecified: Secondary | ICD-10-CM

## 2024-11-14 DIAGNOSIS — I10 Essential (primary) hypertension: Secondary | ICD-10-CM

## 2024-11-14 MED ORDER — HYDROCODONE-ACETAMINOPHEN 7.5-325 MG PO TABS: ORAL_TABLET | ORAL | 0 refills | Status: AC

## 2024-11-14 NOTE — Progress Notes (Signed)
° °  Subjective:    Patient ID: Emily [name], female    DOB: [birth year], 73 y.o.   MRN: [id number]  HPI 3 month follow up pain management Reporting more muscular pain    Review of Systems     Objective:   Physical Exam        Assessment & Plan:

## 2024-11-14 NOTE — Progress Notes (Signed)
° °  Subjective:    Patient ID: Emily [name], female    DOB: 01/29/1951, 73 y.o.   MRN: [id number]  HPI Myalgia - Plan: CK, Sedimentation Rate, C-reactive protein  Hyperlipidemia, unspecified hyperlipidemia type - Plan: Lipid Panel  HTN (hypertension), benign - Plan: Microalbumin/Creatinine Ratio, Urine, Basic metabolic panel with GFR  Prediabetes - Plan: Hemoglobin A1c  High risk medication use - Plan: Hepatic function panel  Patient is on pain medicine for her back She tries to be safe and taking it She denies abusing it PDMP was checked She states she does benefit from the medicine and allows her to function better Patient relates that she has had a lot of myalgia symptoms may or may not be associated  Review of Systems     Objective:   Physical Exam  General-in no acute distress Eyes-no discharge Lungs-respiratory rate normal, CTA CV-no murmurs,RRR Extremities skin warm dry no edema Neuro grossly normal Behavior normal, alert       Assessment & Plan:   1. Myalgia (Primary) Check lab work await results - CK - Sedimentation Rate - C-reactive protein  2. Hyperlipidemia, unspecified hyperlipidemia type Healthy diet, continue medication - Lipid Panel  3. HTN (hypertension), benign Continue blood pressure medicine healthy diet - Microalbumin/Creatinine Ratio, Urine - Basic metabolic panel with GFR  4. Prediabetes Minimize starches in diet check A1c - Hemoglobin A1c  5. High risk medication use Liver profile - Hepatic function panel   The patient was seen in followup for chronic pain. A review over at their current pain status was discussed. Drug registry was checked. Prescriptions were given.  Regular follow-up recommended. Discussion was held regarding the importance of compliance with medication as well as pain medication contract.  Patient was informed that medication may cause drowsiness and should not be combined  with other medications/alcohol  or street drugs. If the patient feels medication is causing altered alertness then do not drive or operate dangerous equipment.  Should be noted that the patient appears to be meeting appropriate use of opioids and response.  Evidenced by improved function and decent pain control without significant side effects and no evidence of overt aberrancy issues.  Upon discussion with the patient today they understand that opioid therapy is optional and they feel that the pain has been refractory to reasonable conservative measures and is significant and affecting quality of life enough to warrant ongoing therapy and wishes to continue opioids.  Refills were provided.  [place]  medical Board guidelines regarding the pain medicine has been reviewed.  CDC guidelines most updated 2022 has been reviewed by the prescriber.  PDMP is checked on a regular basis yearly urine drug screen and pain management contract  Treatment plan for this patient includes #1-gentle stretching exercises as shown daily basis 2.  Mild strength exercises 3 times per week #3 continue pain medications #4 notify us  if any digression

## 2024-11-15 DIAGNOSIS — M5124 Other intervertebral disc displacement, thoracic region: Secondary | ICD-10-CM | POA: Diagnosis not present

## 2024-11-20 ENCOUNTER — Other Ambulatory Visit (HOSPITAL_COMMUNITY): Payer: Self-pay | Admitting: Neurosurgery

## 2024-11-20 DIAGNOSIS — M5124 Other intervertebral disc displacement, thoracic region: Secondary | ICD-10-CM

## 2024-11-23 ENCOUNTER — Ambulatory Visit (HOSPITAL_COMMUNITY)
Admission: RE | Admit: 2024-11-23 | Discharge: 2024-11-23 | Disposition: A | Discharge status: Home / Self Care | Source: Ambulatory Visit | Attending: Neurosurgery | Admitting: Neurosurgery

## 2024-11-23 DIAGNOSIS — M5124 Other intervertebral disc displacement, thoracic region: Secondary | ICD-10-CM | POA: Insufficient documentation

## 2024-11-23 DIAGNOSIS — M5134 Other intervertebral disc degeneration, thoracic region: Secondary | ICD-10-CM

## 2024-11-25 ENCOUNTER — Other Ambulatory Visit: Payer: Self-pay | Admitting: Family Medicine

## 2024-11-29 LAB — LIPID PANEL
Chol/HDL Ratio: 3.2 ratio (ref 0.0–4.4)
Cholesterol, Total: 139 mg/dL (ref 100–199)
HDL: 44 mg/dL
LDL Chol Calc (NIH): 63 mg/dL (ref 0–99)
Triglycerides: 195 mg/dL — ABNORMAL HIGH (ref 0–149)
VLDL Cholesterol Cal: 32 mg/dL (ref 5–40)

## 2024-11-29 LAB — HEPATIC FUNCTION PANEL
ALT: 16 IU/L (ref 0–32)
AST: 19 IU/L (ref 0–40)
Albumin: 4.4 g/dL (ref 3.8–4.8)
Alkaline Phosphatase: 70 IU/L (ref 49–135)
Bilirubin Total: 0.5 mg/dL (ref 0.0–1.2)
Bilirubin, Direct: 0.17 mg/dL (ref 0.00–0.40)
Total Protein: 6.8 g/dL (ref 6.0–8.5)

## 2024-11-29 LAB — BASIC METABOLIC PANEL WITH GFR
BUN/Creatinine Ratio: 13 (ref 12–28)
BUN: 10 mg/dL (ref 8–27)
CO2: 26 mmol/L (ref 20–29)
Calcium: 9.5 mg/dL (ref 8.7–10.3)
Chloride: 100 mmol/L (ref 96–106)
Creatinine, Ser: 0.8 mg/dL (ref 0.57–1.00)
Glucose: 93 mg/dL (ref 70–99)
Potassium: 4 mmol/L (ref 3.5–5.2)
Sodium: 141 mmol/L (ref 134–144)
eGFR: 78 mL/min/1.73

## 2024-11-29 LAB — MICROALBUMIN / CREATININE URINE RATIO
Creatinine, Urine: 118.8 mg/dL
Microalb/Creat Ratio: 8 mg/g{creat} (ref 0–29)
Microalbumin, Urine: 9.5 ug/mL

## 2024-11-29 LAB — SEDIMENTATION RATE: Sed Rate: 2 mm/h (ref 0–40)

## 2024-11-29 LAB — HEMOGLOBIN A1C
Est. average glucose Bld gHb Est-mCnc: 117 mg/dL
Hgb A1c MFr Bld: 5.7 % — ABNORMAL HIGH (ref 4.8–5.6)

## 2024-11-29 LAB — C-REACTIVE PROTEIN: CRP: 2 mg/L (ref 0–10)

## 2024-11-29 LAB — CK: Total CK: 151 U/L (ref 32–182)

## 2024-12-01 ENCOUNTER — Ambulatory Visit: Payer: Self-pay | Admitting: Family Medicine

## 2024-12-24 ENCOUNTER — Other Ambulatory Visit: Payer: Self-pay | Admitting: Family Medicine

## 2025-02-12 ENCOUNTER — Ambulatory Visit: Admitting: Family Medicine

## 2025-02-22 ENCOUNTER — Ambulatory Visit
